# Patient Record
Sex: Female | Born: 1945
Health system: Southern US, Community
[De-identification: ages and names within clinical notes are randomized; demographics above are authoritative.]

## PROBLEM LIST (undated history)

## (undated) DIAGNOSIS — I10 Essential (primary) hypertension: Secondary | ICD-10-CM

## (undated) DIAGNOSIS — C801 Malignant (primary) neoplasm, unspecified: Secondary | ICD-10-CM

## (undated) DIAGNOSIS — J449 Chronic obstructive pulmonary disease, unspecified: Secondary | ICD-10-CM

## (undated) DIAGNOSIS — I6783 Posterior reversible encephalopathy syndrome: Secondary | ICD-10-CM

## (undated) HISTORY — PX: SPLENECTOMY, TOTAL: SHX788

## (undated) HISTORY — PX: LUNG SURGERY: SHX703

## (undated) HISTORY — PX: LEG SURGERY: SHX1003

## (undated) HISTORY — PX: ABDOMINAL HYSTERECTOMY: SHX81

## (undated) HISTORY — PX: THYROIDECTOMY: SHX17

## (undated) HISTORY — PX: CHOLECYSTECTOMY: SHX55

## (undated) HISTORY — DX: Posterior reversible encephalopathy syndrome: I67.83

## (undated) HISTORY — PX: CATARACT EXTRACTION, BILATERAL: SHX1313

## (undated) HISTORY — PX: HIP SURGERY: SHX245

---

## 2013-01-01 DIAGNOSIS — E890 Postprocedural hypothyroidism: Secondary | ICD-10-CM | POA: Diagnosis present

## 2017-07-03 DIAGNOSIS — C569 Malignant neoplasm of unspecified ovary: Secondary | ICD-10-CM | POA: Diagnosis present

## 2019-04-15 DIAGNOSIS — Z7901 Long term (current) use of anticoagulants: Secondary | ICD-10-CM

## 2019-07-03 ENCOUNTER — Inpatient Hospital Stay (HOSPITAL_COMMUNITY)
Admission: EM | Admit: 2019-07-03 | Discharge: 2019-07-17 | DRG: 070 | Disposition: A | Discharge status: Home Health | Payer: Medicare Other | Attending: Internal Medicine | Admitting: Internal Medicine

## 2019-07-03 ENCOUNTER — Emergency Department (HOSPITAL_COMMUNITY): Payer: Medicare Other

## 2019-07-03 ENCOUNTER — Other Ambulatory Visit: Payer: Self-pay

## 2019-07-03 ENCOUNTER — Encounter (HOSPITAL_COMMUNITY): Payer: Self-pay | Admitting: Emergency Medicine

## 2019-07-03 DIAGNOSIS — E876 Hypokalemia: Secondary | ICD-10-CM | POA: Diagnosis not present

## 2019-07-03 DIAGNOSIS — R0603 Acute respiratory distress: Secondary | ICD-10-CM | POA: Diagnosis not present

## 2019-07-03 DIAGNOSIS — Z7901 Long term (current) use of anticoagulants: Secondary | ICD-10-CM

## 2019-07-03 DIAGNOSIS — E871 Hypo-osmolality and hyponatremia: Secondary | ICD-10-CM | POA: Diagnosis not present

## 2019-07-03 DIAGNOSIS — R0602 Shortness of breath: Secondary | ICD-10-CM | POA: Diagnosis not present

## 2019-07-03 DIAGNOSIS — F329 Major depressive disorder, single episode, unspecified: Secondary | ICD-10-CM | POA: Diagnosis present

## 2019-07-03 DIAGNOSIS — I129 Hypertensive chronic kidney disease with stage 1 through stage 4 chronic kidney disease, or unspecified chronic kidney disease: Secondary | ICD-10-CM | POA: Diagnosis present

## 2019-07-03 DIAGNOSIS — R509 Fever, unspecified: Secondary | ICD-10-CM

## 2019-07-03 DIAGNOSIS — D649 Anemia, unspecified: Secondary | ICD-10-CM | POA: Diagnosis not present

## 2019-07-03 DIAGNOSIS — Z86711 Personal history of pulmonary embolism: Secondary | ICD-10-CM

## 2019-07-03 DIAGNOSIS — C569 Malignant neoplasm of unspecified ovary: Secondary | ICD-10-CM | POA: Diagnosis not present

## 2019-07-03 DIAGNOSIS — A419 Sepsis, unspecified organism: Secondary | ICD-10-CM | POA: Diagnosis not present

## 2019-07-03 DIAGNOSIS — G9340 Encephalopathy, unspecified: Secondary | ICD-10-CM | POA: Diagnosis not present

## 2019-07-03 DIAGNOSIS — M47812 Spondylosis without myelopathy or radiculopathy, cervical region: Secondary | ICD-10-CM | POA: Diagnosis present

## 2019-07-03 DIAGNOSIS — M542 Cervicalgia: Secondary | ICD-10-CM | POA: Diagnosis not present

## 2019-07-03 DIAGNOSIS — Z9089 Acquired absence of other organs: Secondary | ICD-10-CM

## 2019-07-03 DIAGNOSIS — R32 Unspecified urinary incontinence: Secondary | ICD-10-CM | POA: Diagnosis present

## 2019-07-03 DIAGNOSIS — E890 Postprocedural hypothyroidism: Secondary | ICD-10-CM | POA: Diagnosis present

## 2019-07-03 DIAGNOSIS — N170 Acute kidney failure with tubular necrosis: Secondary | ICD-10-CM | POA: Diagnosis not present

## 2019-07-03 DIAGNOSIS — M4802 Spinal stenosis, cervical region: Secondary | ICD-10-CM | POA: Diagnosis present

## 2019-07-03 DIAGNOSIS — J9601 Acute respiratory failure with hypoxia: Secondary | ICD-10-CM | POA: Diagnosis not present

## 2019-07-03 DIAGNOSIS — I6783 Posterior reversible encephalopathy syndrome: Principal | ICD-10-CM | POA: Diagnosis present

## 2019-07-03 DIAGNOSIS — R6520 Severe sepsis without septic shock: Secondary | ICD-10-CM | POA: Diagnosis not present

## 2019-07-03 DIAGNOSIS — Z79899 Other long term (current) drug therapy: Secondary | ICD-10-CM | POA: Diagnosis not present

## 2019-07-03 DIAGNOSIS — R680 Hypothermia, not associated with low environmental temperature: Secondary | ICD-10-CM | POA: Diagnosis not present

## 2019-07-03 DIAGNOSIS — Z79890 Hormone replacement therapy (postmenopausal): Secondary | ICD-10-CM

## 2019-07-03 DIAGNOSIS — R0600 Dyspnea, unspecified: Secondary | ICD-10-CM

## 2019-07-03 DIAGNOSIS — E872 Acidosis: Secondary | ICD-10-CM | POA: Diagnosis not present

## 2019-07-03 DIAGNOSIS — R4701 Aphasia: Secondary | ICD-10-CM | POA: Diagnosis present

## 2019-07-03 DIAGNOSIS — J189 Pneumonia, unspecified organism: Secondary | ICD-10-CM | POA: Diagnosis not present

## 2019-07-03 DIAGNOSIS — R21 Rash and other nonspecific skin eruption: Secondary | ICD-10-CM | POA: Diagnosis present

## 2019-07-03 DIAGNOSIS — K529 Noninfective gastroenteritis and colitis, unspecified: Secondary | ICD-10-CM | POA: Diagnosis present

## 2019-07-03 DIAGNOSIS — Z86718 Personal history of other venous thrombosis and embolism: Secondary | ICD-10-CM

## 2019-07-03 DIAGNOSIS — I10 Essential (primary) hypertension: Secondary | ICD-10-CM | POA: Diagnosis not present

## 2019-07-03 DIAGNOSIS — Z20828 Contact with and (suspected) exposure to other viral communicable diseases: Secondary | ICD-10-CM | POA: Diagnosis present

## 2019-07-03 DIAGNOSIS — Z9221 Personal history of antineoplastic chemotherapy: Secondary | ICD-10-CM

## 2019-07-03 DIAGNOSIS — Z9081 Acquired absence of spleen: Secondary | ICD-10-CM | POA: Diagnosis not present

## 2019-07-03 DIAGNOSIS — N179 Acute kidney failure, unspecified: Secondary | ICD-10-CM

## 2019-07-03 DIAGNOSIS — C799 Secondary malignant neoplasm of unspecified site: Secondary | ICD-10-CM | POA: Diagnosis present

## 2019-07-03 DIAGNOSIS — L909 Atrophic disorder of skin, unspecified: Secondary | ICD-10-CM | POA: Diagnosis not present

## 2019-07-03 DIAGNOSIS — R531 Weakness: Secondary | ICD-10-CM | POA: Diagnosis not present

## 2019-07-03 DIAGNOSIS — R4182 Altered mental status, unspecified: Secondary | ICD-10-CM | POA: Diagnosis not present

## 2019-07-03 DIAGNOSIS — D630 Anemia in neoplastic disease: Secondary | ICD-10-CM | POA: Diagnosis present

## 2019-07-03 DIAGNOSIS — C7960 Secondary malignant neoplasm of unspecified ovary: Secondary | ICD-10-CM | POA: Diagnosis not present

## 2019-07-03 DIAGNOSIS — R34 Anuria and oliguria: Secondary | ICD-10-CM | POA: Diagnosis not present

## 2019-07-03 DIAGNOSIS — Z888 Allergy status to other drugs, medicaments and biological substances status: Secondary | ICD-10-CM | POA: Diagnosis not present

## 2019-07-03 DIAGNOSIS — T451X5A Adverse effect of antineoplastic and immunosuppressive drugs, initial encounter: Secondary | ICD-10-CM | POA: Diagnosis present

## 2019-07-03 DIAGNOSIS — Z90722 Acquired absence of ovaries, bilateral: Secondary | ICD-10-CM

## 2019-07-03 DIAGNOSIS — G92 Toxic encephalopathy: Secondary | ICD-10-CM | POA: Diagnosis not present

## 2019-07-03 DIAGNOSIS — D638 Anemia in other chronic diseases classified elsewhere: Secondary | ICD-10-CM | POA: Diagnosis not present

## 2019-07-03 DIAGNOSIS — R1310 Dysphagia, unspecified: Secondary | ICD-10-CM | POA: Diagnosis present

## 2019-07-03 DIAGNOSIS — R159 Full incontinence of feces: Secondary | ICD-10-CM | POA: Diagnosis not present

## 2019-07-03 DIAGNOSIS — R413 Other amnesia: Secondary | ICD-10-CM | POA: Diagnosis not present

## 2019-07-03 DIAGNOSIS — Z9079 Acquired absence of other genital organ(s): Secondary | ICD-10-CM

## 2019-07-03 DIAGNOSIS — N19 Unspecified kidney failure: Secondary | ICD-10-CM | POA: Diagnosis not present

## 2019-07-03 DIAGNOSIS — Z9181 History of falling: Secondary | ICD-10-CM

## 2019-07-03 DIAGNOSIS — E8809 Other disorders of plasma-protein metabolism, not elsewhere classified: Secondary | ICD-10-CM | POA: Diagnosis not present

## 2019-07-03 DIAGNOSIS — L658 Other specified nonscarring hair loss: Secondary | ICD-10-CM | POA: Diagnosis present

## 2019-07-03 DIAGNOSIS — R197 Diarrhea, unspecified: Secondary | ICD-10-CM | POA: Diagnosis not present

## 2019-07-03 DIAGNOSIS — B59 Pneumocystosis: Secondary | ICD-10-CM | POA: Diagnosis not present

## 2019-07-03 DIAGNOSIS — D539 Nutritional anemia, unspecified: Secondary | ICD-10-CM | POA: Diagnosis not present

## 2019-07-03 DIAGNOSIS — C801 Malignant (primary) neoplasm, unspecified: Secondary | ICD-10-CM | POA: Diagnosis not present

## 2019-07-03 DIAGNOSIS — D72829 Elevated white blood cell count, unspecified: Secondary | ICD-10-CM | POA: Diagnosis not present

## 2019-07-03 DIAGNOSIS — Z9889 Other specified postprocedural states: Secondary | ICD-10-CM | POA: Diagnosis not present

## 2019-07-03 DIAGNOSIS — Z90710 Acquired absence of both cervix and uterus: Secondary | ICD-10-CM | POA: Diagnosis not present

## 2019-07-03 DIAGNOSIS — N183 Chronic kidney disease, stage 3 (moderate): Secondary | ICD-10-CM | POA: Diagnosis present

## 2019-07-03 HISTORY — DX: Malignant (primary) neoplasm, unspecified: C80.1

## 2019-07-03 LAB — COMPREHENSIVE METABOLIC PANEL
ALT: 17 U/L (ref 0–44)
ALT: 17 U/L (ref 0–44)
AST: 19 U/L (ref 15–41)
AST: 27 U/L (ref 15–41)
Albumin: 2.7 g/dL — ABNORMAL LOW (ref 3.5–5.0)
Albumin: 2.8 g/dL — ABNORMAL LOW (ref 3.5–5.0)
Alkaline Phosphatase: 73 U/L (ref 38–126)
Alkaline Phosphatase: 74 U/L (ref 38–126)
Anion gap: 12 (ref 5–15)
Anion gap: 12 (ref 5–15)
BUN: 13 mg/dL (ref 8–23)
BUN: 14 mg/dL (ref 8–23)
CO2: 27 mmol/L (ref 22–32)
CO2: 23 mmol/L (ref 22–32)
Calcium: 11.4 mg/dL — ABNORMAL HIGH (ref 8.9–10.3)
Calcium: 10.6 mg/dL — ABNORMAL HIGH (ref 8.9–10.3)
Chloride: 96 mmol/L — ABNORMAL LOW (ref 98–111)
Chloride: 102 mmol/L (ref 98–111)
Creatinine, Ser: 1.23 mg/dL — ABNORMAL HIGH (ref 0.44–1.00)
Creatinine, Ser: 1.25 mg/dL — ABNORMAL HIGH (ref 0.44–1.00)
GFR calc Af Amer: 50 mL/min — ABNORMAL LOW (ref >60)
GFR calc Af Amer: 49 mL/min — ABNORMAL LOW (ref >60)
GFR calc non Af Amer: 43 mL/min — ABNORMAL LOW (ref >60)
GFR calc non Af Amer: 42 mL/min — ABNORMAL LOW (ref >60)
Glucose, Bld: 136 mg/dL — ABNORMAL HIGH (ref 70–99)
Glucose, Bld: 104 mg/dL — ABNORMAL HIGH (ref 70–99)
Potassium: 3.3 mmol/L — ABNORMAL LOW (ref 3.5–5.1)
Potassium: 3.8 mmol/L (ref 3.5–5.1)
Sodium: 135 mmol/L (ref 135–145)
Sodium: 137 mmol/L (ref 135–145)
Total Bilirubin: 0.4 mg/dL (ref 0.3–1.2)
Total Bilirubin: 0.5 mg/dL (ref 0.3–1.2)
Total Protein: 5.7 g/dL — ABNORMAL LOW (ref 6.5–8.1)
Total Protein: 6.2 g/dL — ABNORMAL LOW (ref 6.5–8.1)

## 2019-07-03 LAB — PROTIME-INR
INR: 1.8 — ABNORMAL HIGH (ref 0.8–1.2)
INR: 1.5 — ABNORMAL HIGH (ref 0.8–1.2)
Prothrombin Time: 21 seconds — ABNORMAL HIGH (ref 11.4–15.2)
Prothrombin Time: 18.3 seconds — ABNORMAL HIGH (ref 11.4–15.2)

## 2019-07-03 LAB — CBC WITH DIFFERENTIAL/PLATELET
Abs Immature Granulocytes: 0.3 10*3/uL — ABNORMAL HIGH (ref 0.00–0.07)
Basophils Absolute: 0.3 10*3/uL — ABNORMAL HIGH (ref 0.0–0.1)
Basophils Relative: 5 %
Eosinophils Absolute: 0 10*3/uL (ref 0.0–0.5)
Eosinophils Relative: 0 %
HCT: 25.9 % — ABNORMAL LOW (ref 36.0–46.0)
Hemoglobin: 8.9 g/dL — ABNORMAL LOW (ref 12.0–15.0)
Lymphocytes Relative: 18 %
Lymphs Abs: 1 10*3/uL (ref 0.7–4.0)
MCH: 34.4 pg — ABNORMAL HIGH (ref 26.0–34.0)
MCHC: 34.4 g/dL (ref 30.0–36.0)
MCV: 100 fL (ref 80.0–100.0)
Metamyelocytes Relative: 1 %
Monocytes Absolute: 0.1 10*3/uL (ref 0.1–1.0)
Monocytes Relative: 1 %
Myelocytes: 2 %
Neutro Abs: 3.8 10*3/uL (ref 1.7–7.7)
Neutrophils Relative %: 71 %
Platelets: 315 10*3/uL (ref 150–400)
Promyelocytes Relative: 2 %
RBC: 2.59 MIL/uL — ABNORMAL LOW (ref 3.87–5.11)
RDW: 18.6 % — ABNORMAL HIGH (ref 11.5–15.5)
WBC: 5.4 10*3/uL (ref 4.0–10.5)
nRBC: 17.5 % — ABNORMAL HIGH (ref 0.0–0.2)
nRBC: 40 /100 WBC — ABNORMAL HIGH

## 2019-07-03 LAB — URINALYSIS, ROUTINE W REFLEX MICROSCOPIC
Bilirubin Urine: NEGATIVE
Glucose, UA: NEGATIVE mg/dL
Hgb urine dipstick: NEGATIVE
Ketones, ur: NEGATIVE mg/dL
Nitrite: POSITIVE — AB
Protein, ur: NEGATIVE mg/dL
Specific Gravity, Urine: 1.014 (ref 1.005–1.030)
pH: 6 (ref 5.0–8.0)

## 2019-07-03 LAB — APTT: aPTT: 42 seconds — ABNORMAL HIGH (ref 24–36)

## 2019-07-03 LAB — LACTIC ACID, PLASMA: Lactic Acid, Venous: 1.7 mmol/L (ref 0.5–1.9)

## 2019-07-03 LAB — PHOSPHORUS: Phosphorus: 3.4 mg/dL (ref 2.5–4.6)

## 2019-07-03 LAB — MAGNESIUM: Magnesium: 1.1 mg/dL — ABNORMAL LOW (ref 1.7–2.4)

## 2019-07-03 LAB — SARS CORONAVIRUS 2 BY RT PCR (HOSPITAL ORDER, PERFORMED IN ~~LOC~~ HOSPITAL LAB): SARS Coronavirus 2: NEGATIVE

## 2019-07-03 MED ORDER — DEXTROSE 5 % AND 0.45 % NACL IV BOLUS: 1000.0000 mL | Freq: Once | INTRAVENOUS | Status: DC

## 2019-07-03 MED ORDER — SODIUM CHLORIDE 0.9 % IV BOLUS
500.0000 mL | Freq: Once | INTRAVENOUS | Status: AC
  Administered 2019-07-03: 500 mL via INTRAVENOUS

## 2019-07-03 MED ORDER — LEVOTHYROXINE SODIUM 25 MCG PO TABS
137.0000 ug | ORAL_TABLET | Freq: Every day | ORAL | Status: DC
  Administered 2019-07-04 – 2019-07-17 (×13): 137 ug via ORAL
  Filled 2019-07-03 (×13): qty 1

## 2019-07-03 MED ORDER — BUPROPION HCL ER (XL) 150 MG PO TB24
150.0000 mg | ORAL_TABLET | Freq: Every day | ORAL | Status: DC
  Administered 2019-07-04: 150 mg via ORAL
  Filled 2019-07-03 (×2): qty 1

## 2019-07-03 MED ORDER — SODIUM CHLORIDE 0.9 % IV SOLN
2.0000 g | Freq: Two times a day (BID) | INTRAVENOUS | Status: DC
  Administered 2019-07-04 – 2019-07-05 (×3): 2 g via INTRAVENOUS
  Filled 2019-07-03 (×3): qty 2

## 2019-07-03 MED ORDER — ACETAMINOPHEN 650 MG RE SUPP
650.0000 mg | Freq: Four times a day (QID) | RECTAL | Status: DC | PRN
  Administered 2019-07-05 – 2019-07-10 (×2): 650 mg via RECTAL
  Filled 2019-07-03 (×3): qty 1

## 2019-07-03 MED ORDER — SODIUM CHLORIDE 0.9% FLUSH
10.0000 mL | INTRAVENOUS | Status: DC | PRN
  Administered 2019-07-10 (×2): 10 mL
  Filled 2019-07-03 (×2): qty 40

## 2019-07-03 MED ORDER — LACTATED RINGERS IV BOLUS
500.0000 mL | Freq: Once | INTRAVENOUS | Status: AC
  Administered 2019-07-03: 500 mL via INTRAVENOUS

## 2019-07-03 MED ORDER — POTASSIUM CHLORIDE CRYS ER 20 MEQ PO TBCR
40.0000 meq | EXTENDED_RELEASE_TABLET | Freq: Once | ORAL | Status: AC
  Administered 2019-07-03: 40 meq via ORAL
  Filled 2019-07-03: qty 2

## 2019-07-03 MED ORDER — ONDANSETRON HCL 4 MG PO TABS: 4.0000 mg | ORAL_TABLET | Freq: Four times a day (QID) | ORAL | Status: DC | PRN

## 2019-07-03 MED ORDER — SODIUM CHLORIDE 0.9% FLUSH
3.0000 mL | Freq: Two times a day (BID) | INTRAVENOUS | Status: DC
  Administered 2019-07-05 – 2019-07-16 (×8): 3 mL via INTRAVENOUS

## 2019-07-03 MED ORDER — GADOBUTROL 1 MMOL/ML IV SOLN
7.5000 mL | Freq: Once | INTRAVENOUS | Status: AC | PRN
  Administered 2019-07-03: 7.5 mL via INTRAVENOUS

## 2019-07-03 MED ORDER — VANCOMYCIN HCL 10 G IV SOLR: 1250.0000 mg | INTRAVENOUS | Status: DC

## 2019-07-03 MED ORDER — RIVAROXABAN 20 MG PO TABS
20.0000 mg | ORAL_TABLET | Freq: Every day | ORAL | Status: DC
  Administered 2019-07-04 – 2019-07-06 (×3): 20 mg via ORAL
  Filled 2019-07-03 (×4): qty 1

## 2019-07-03 MED ORDER — GABAPENTIN 300 MG PO CAPS
600.0000 mg | ORAL_CAPSULE | Freq: Every day | ORAL | Status: DC
  Administered 2019-07-03 – 2019-07-04 (×2): 600 mg via ORAL
  Filled 2019-07-03 (×2): qty 2

## 2019-07-03 MED ORDER — MAGNESIUM SULFATE 4 GM/100ML IV SOLN
4.0000 g | Freq: Once | INTRAVENOUS | Status: AC
  Administered 2019-07-03: 4 g via INTRAVENOUS
  Filled 2019-07-03: qty 100

## 2019-07-03 MED ORDER — SODIUM CHLORIDE 0.9 % IV SOLN
INTRAVENOUS | Status: DC
  Administered 2019-07-03 – 2019-07-04 (×2): via INTRAVENOUS

## 2019-07-03 MED ORDER — THIAMINE HCL 100 MG/ML IJ SOLN: 100.0000 mg | Freq: Every day | INTRAMUSCULAR | Status: DC

## 2019-07-03 MED ORDER — ESCITALOPRAM OXALATE 10 MG PO TABS
20.0000 mg | ORAL_TABLET | Freq: Every day | ORAL | Status: DC
  Administered 2019-07-04: 20 mg via ORAL
  Filled 2019-07-03 (×2): qty 2

## 2019-07-03 MED ORDER — VANCOMYCIN HCL 10 G IV SOLR
1750.0000 mg | Freq: Once | INTRAVENOUS | Status: AC
  Administered 2019-07-03: 1750 mg via INTRAVENOUS
  Filled 2019-07-03: qty 1750

## 2019-07-03 MED ORDER — ACETAMINOPHEN 325 MG PO TABS
650.0000 mg | ORAL_TABLET | Freq: Four times a day (QID) | ORAL | Status: DC | PRN
  Administered 2019-07-03 – 2019-07-06 (×6): 650 mg via ORAL
  Filled 2019-07-03 (×9): qty 2

## 2019-07-03 MED ORDER — ONDANSETRON HCL 4 MG/2ML IJ SOLN
4.0000 mg | Freq: Four times a day (QID) | INTRAMUSCULAR | Status: DC | PRN
  Administered 2019-07-15 – 2019-07-17 (×3): 4 mg via INTRAVENOUS
  Filled 2019-07-03 (×3): qty 2

## 2019-07-03 MED ORDER — LIDOCAINE-PRILOCAINE 2.5-2.5 % EX CREA
1.0000 "application " | TOPICAL_CREAM | CUTANEOUS | Status: DC | PRN
  Filled 2019-07-03: qty 5

## 2019-07-03 MED ORDER — SENNOSIDES-DOCUSATE SODIUM 8.6-50 MG PO TABS: 1.0000 | ORAL_TABLET | Freq: Every evening | ORAL | Status: DC | PRN

## 2019-07-03 MED ORDER — SODIUM CHLORIDE 0.9 % IV SOLN
2.0000 g | Freq: Once | INTRAVENOUS | Status: AC
  Administered 2019-07-03: 2 g via INTRAVENOUS
  Filled 2019-07-03: qty 2

## 2019-07-03 NOTE — ED Provider Notes (Signed)
[facility] EMERGENCY DEPARTMENT Provider Note   CSN: [id number] Arrival date & time: 07/03/19  3875    History   Chief Complaint Chief Complaint  Patient presents with   Weakness   Fever    HPI Rhonda Hale is a 73 y.o. female.     The history is provided by the patient, the EMS personnel and medical records. No language interpreter was used.  Weakness Associated symptoms: fever   Fever  Rhonda Hale is a 73 y.o. female that presents to the ED complaining of weakness, fever, diarrhea.  She began feeling poorly yesterday with fever to 100.5. Today she had a fever to 102. Today she developed diarrhea and generalized weakness.  She also complains of left sided posterior neck pain for the last several days.  Pain is waxing and waning with no clear alleviating factors.  She is currently undergoing chemo for hx/o ovarian cancer -last treatment was one week ago. She is not had problems with fevers during her chemo. She is status post splenectomy.  Past Medical History:  Diagnosis Date   Cancer [facility])     Patient Active Problem List   Diagnosis Date Noted   Fever 07/03/2019   AKI (acute kidney injury) ([place]) 07/03/2019   Hypercalcemia 07/03/2019   Hypomagnesemia, chronic 07/03/2019       OB History   No obstetric history on file.      Home Medications    Prior to Admission medications   Medication Sig Start Date End Date Taking? Authorizing Provider  buPROPion (WELLBUTRIN XL) 150 MG 24 hr tablet Take 150 mg by mouth daily. 06/17/19  Yes [provider]  calcitRIOL (ROCALTROL) 0.5 MCG capsule Take 0.5 mcg by mouth 2 (two) times a day. 05/13/19  Yes [provider]  Calcium Carb-Cholecalciferol (CALCIUM-VITAMIN D) 600-400 MG-UNIT TABS Take 1 tablet by mouth 2 (two) times a day.   Yes [provider]  dexamethasone (DECADRON) 4 MG tablet Take 4 mg by mouth as needed. After Chemotherapy 06/07/19  Yes [provider]   escitalopram (LEXAPRO) 20 MG tablet Take 20 mg by mouth daily.   Yes [provider]  gabapentin (NEURONTIN) 300 MG capsule Take 600 mg by mouth at bedtime. 05/03/19  Yes [provider]  levothyroxine (SYNTHROID) 137 MCG tablet Take 137 mcg by mouth daily before breakfast.   Yes [provider]  lidocaine-prilocaine (EMLA) cream Apply 1 application topically as needed. 08/17/18 08/17/19 Yes [provider]  ondansetron (ZOFRAN) 8 MG tablet Take 8 mg by mouth every 8 (eight) hours as needed for nausea or vomiting.   Yes [provider]  rivaroxaban (XARELTO) 20 MG TABS tablet Take 20 mg by mouth daily.   Yes [provider]    Family History History reviewed. No pertinent family history.  Social History Social History   Tobacco Use   Smoking status: Never Smoker   Smokeless tobacco: Never Used  Substance Use Topics   Alcohol use: Not Currently   Drug use: Never     Allergies   Atorvastatin   Review of Systems Review of Systems  Constitutional: Positive for fever.  Neurological: Positive for weakness.  All other systems reviewed and are negative.    Physical Exam Updated Vital Signs BP (!) 166/76    Pulse 78    Temp 100.3 F (37.9 C) (Rectal)    Resp (!) 24    Ht 5\' 2"  (1.575 m)    Wt 78.5 kg  SpO2 94%    BMI 31.64 kg/m   Physical Exam Vitals signs and nursing note reviewed.  Constitutional:      Appearance: She is well-developed.  HENT:     Head: Normocephalic and atraumatic.  Cardiovascular:     Rate and Rhythm: Normal rate and regular rhythm.     Heart sounds: No murmur.  Pulmonary:     Effort: Pulmonary effort is normal. No respiratory distress.     Breath sounds: Normal breath sounds.  Abdominal:     Palpations: Abdomen is soft.     Tenderness: There is no abdominal tenderness. There is no guarding or rebound.  Musculoskeletal:        General: No tenderness.  Skin:    General: Skin is warm and dry.      Comments: Maculopapular rash to bilateral upper extremities  Neurological:     Mental Status: She is alert and oriented to person, place, and time.     Comments: Five out of five strength in all four extremities.  Psychiatric:        Behavior: Behavior normal.      ED Treatments / Results  Labs (all labs ordered are listed, but only abnormal results are displayed) Labs Reviewed  COMPREHENSIVE METABOLIC PANEL - Abnormal; Notable for the following components:      Result Value   Potassium 3.3 (*)    Chloride 96 (*)    Glucose, Bld 136 (*)    Creatinine, Ser 1.23 (*)    Calcium 11.4 (*)    Total Protein 5.7 (*)    Albumin 2.7 (*)    GFR calc non Af Amer 43 (*)    GFR calc Af Amer 50 (*)    All other components within normal limits  CBC WITH DIFFERENTIAL/PLATELET - Abnormal; Notable for the following components:   RBC 2.59 (*)    Hemoglobin 8.9 (*)    HCT 25.9 (*)    MCH 34.4 (*)    RDW 18.6 (*)    nRBC 17.5 (*)    Basophils Absolute 0.3 (*)    nRBC 40 (*)    Abs Immature Granulocytes 0.30 (*)    All other components within normal limits  APTT - Abnormal; Notable for the following components:   aPTT 42 (*)    All other components within normal limits  PROTIME-INR - Abnormal; Notable for the following components:   Prothrombin Time 21.0 (*)    INR 1.8 (*)    All other components within normal limits  MAGNESIUM - Abnormal; Notable for the following components:   Magnesium 1.1 (*)    All other components within normal limits  SARS CORONAVIRUS 2 (HOSPITAL ORDER, [place])  CULTURE, BLOOD (ROUTINE X 2)  CULTURE, BLOOD (ROUTINE X 2)  URINE CULTURE  MRSA PCR SCREENING  LACTIC ACID, PLASMA  URINALYSIS, ROUTINE W REFLEX MICROSCOPIC  PHOSPHORUS  COMPREHENSIVE METABOLIC PANEL  PROTIME-INR    EKG EKG Interpretation  Date/Time:  Wednesday July 03 2019 09:18:24 EDT Ventricular Rate:  99 PR Interval:    QRS Duration: 74 QT  Interval:  333 QTC Calculation: 428 R Axis:   -14 Text Interpretation:  Ectopic atrial rhythm Minimal ST depression, lateral leads no prior available for comparison Confirmed by Quintella Reichert [phone]) on 07/03/2019 9:47:29 AM   Radiology Mr Cervical Spine W Or Wo Contrast  Result Date: 07/03/2019 CLINICAL DATA:  Fever.  Posterior left-sided neck pain. EXAM: MRI CERVICAL SPINE WITHOUT AND WITH CONTRAST  TECHNIQUE: Multiplanar and multiecho pulse sequences of the cervical spine, to include the craniocervical junction and cervicothoracic junction, were obtained without and with intravenous contrast. CONTRAST:  7.5 mL Gadavist intravenous contrast. COMPARISON:  None. FINDINGS: Alignment: Physiologic. Vertebrae: No fracture, evidence of discitis, or marrow replacing lesion. No abnormal enhancement. Cord: Normal signal and morphology. Posterior Fossa, vertebral arteries, paraspinal tissues: Poor visualization of the right vertebral artery flow void. Left vertebral artery appears unremarkable. Posterior fossa is unremarkable. No abnormality within the paraspinal soft tissues. No abnormal enhancement. Disc levels: C2-C3: No significant disc protrusion. Right greater than left uncovertebral arthropathy. Left worse than right facet arthropathy. Mild bilateral foraminal narrowing. No canal stenosis. C3-C4: Posterior disc osteophyte complex with impress upon the ventral cord. Bilateral facet and uncovertebral arthropathy result in moderate bilateral foraminal narrowing. Mild-to-moderate canal stenosis. C4-C5: Small posterior disc osteophyte complex and mild bilateral facet and uncovertebral arthropathy resultant mild bilateral foraminal narrowing and mild canal stenosis. C5-C6: Posterior disc osteophyte complex and bilateral facet and uncovertebral arthropathy result in moderate bilateral foraminal narrowing and moderate canal stenosis. C6-C7: Small posterior disc osteophyte complex and bilateral uncovertebral  arthropathy result in mild bilateral foraminal narrowing without significant canal stenosis. C7-T1: No significant disc protrusion, foraminal stenosis, or canal stenosis. IMPRESSION: 1. Multilevel cervical spondylosis most pronounced at the C5-6 level where there is moderate bilateral foraminal narrowing and moderate canal stenosis. 2. No mass or fluid collection identified.  No abnormal enhancement. 3. Poor visualization of the right vertebral artery flow void, which is of uncertain clinical significance. A CT angiogram of the neck could be considered if the patient's neurologic symptoms clinically warrant further evaluation. Electronically Signed   By: Davina Poke M.D.   On: 07/03/2019 15:02   Dg Chest Port 1 View  Result Date: 07/03/2019 CLINICAL DATA:  Onset fever and weakness yesterday. EXAM: PORTABLE CHEST 1 VIEW COMPARISON:  None. FINDINGS: Port-A-Cath is in place. Postoperative change left upper lobe noted. Lungs are clear. Heart size is normal. No pneumothorax or pleural effusion. No acute bony abnormality. Remote healed left sixth rib fracture noted. IMPRESSION: No acute disease. Electronically Signed   By: Inge Rise M.D.   On: 07/03/2019 10:12    Procedures Procedures (including critical care time)  Medications Ordered in ED Medications  vancomycin (VANCOCIN) 1,250 mg in sodium chloride 0.9 % 250 mL IVPB (has no administration in time range)  ceFEPIme (MAXIPIME) 2 g in sodium chloride 0.9 % 100 mL IVPB (has no administration in time range)  buPROPion (WELLBUTRIN XL) 24 hr tablet 150 mg (has no administration in time range)  escitalopram (LEXAPRO) tablet 20 mg (has no administration in time range)  levothyroxine (SYNTHROID) tablet 137 mcg (has no administration in time range)  rivaroxaban (XARELTO) tablet 20 mg (has no administration in time range)  gabapentin (NEURONTIN) capsule 600 mg (has no administration in time range)  lidocaine-prilocaine (EMLA) cream 1 application  (has no administration in time range)  sodium chloride flush (NS) 0.9 % injection 3 mL (has no administration in time range)  magnesium sulfate IVPB 4 g 100 mL (has no administration in time range)  acetaminophen (TYLENOL) tablet 650 mg (has no administration in time range)    Or  acetaminophen (TYLENOL) suppository 650 mg (has no administration in time range)  senna-docusate (Senokot-S) tablet 1 tablet (has no administration in time range)  ondansetron (ZOFRAN) tablet 4 mg (has no administration in time range)    Or  ondansetron (ZOFRAN) injection 4 mg (has no administration in  time range)  potassium chloride SA (K-DUR) CR tablet 40 mEq (has no administration in time range)  0.9 %  sodium chloride infusion (has no administration in time range)  sodium chloride 0.9 % bolus 500 mL (0 mLs Intravenous Stopped 07/03/19 1129)  vancomycin (VANCOCIN) 1,750 mg in sodium chloride 0.9 % 500 mL IVPB (1,750 mg Intravenous New Bag/Given 07/03/19 1129)  ceFEPIme (MAXIPIME) 2 g in sodium chloride 0.9 % 100 mL IVPB (0 g Intravenous Stopped 07/03/19 1128)  lactated ringers bolus 500 mL (500 mLs Intravenous New Bag/Given 07/03/19 1303)  gadobutrol (GADAVIST) 1 MMOL/ML injection 7.5 mL (7.5 mLs Intravenous Contrast Given 07/03/19 1440)     Initial Impression / Assessment and Plan / ED Course  I have reviewed the triage vital signs and the nursing notes.  Pertinent labs & imaging results that were available during my care of the patient were reviewed by me and considered in my medical decision making (see chart for details).        Patient here for evaluation of fever, weakness. She is chronically ill appearing on evaluation and in no acute distress. Given her history of splenectomy, recent chemotherapy she was treated with broad-spectrum antibiotics for possible bacteremia. Labs demonstrate stable renal insufficiency, anemia. COVID swab is negative. Plan to admit for further monitoring and observation. Patient  and daughter updated of findings of studies recommendation for admission and she is in agreement with treatment plan.  Final Clinical Impressions(s) / ED Diagnoses   Final diagnoses:  Febrile illness, acute    ED Discharge Orders    None       Quintella Reichert, MD 07/03/19 [phone]

## 2019-07-03 NOTE — Progress Notes (Signed)
Pharmacy Antibiotic Note  Rhonda Hale is a 73 y.o. female presented to the ED on 07/03/2019 with complaints of weakness, fever (102.5), and diarrhea. Pt is asplenic and is currently undergoing chemo for history of ovarian cancer. Pharmacy has been consulted for vancomycin and cefepime dosing for fever of unknown origin.  Plan: Vancomycin 1750 mg IV once for loading dose.  Ordered vancomycin 1250 mg q48h based on AUC dosing Predicted AUC of ~525. AUC goal of 400-550. SCr used: 1.23  Cefepime 2g IV q12 hours. Dosing appropriate for renal function.  Blood cultures, urine culture, and COVID-19 test have been collected. MRSA PCR ordered.  Continue to follow patient's clinical condition, culture results, WBCs, and renal function.  Height: 5\' 2"  (157.5 cm) Weight: 173 lb (78.5 kg) IBW/kg (Calculated) : 50.1  Temp (24hrs), Avg:99.7 F (37.6 C), Min:99.1 F (37.3 C), Max:100.3 F (37.9 C)  No results for input(s): WBC, CREATININE, LATICACIDVEN, VANCOTROUGH, VANCOPEAK, VANCORANDOM, GENTTROUGH, GENTPEAK, GENTRANDOM, TOBRATROUGH, TOBRAPEAK, TOBRARND, AMIKACINPEAK, AMIKACINTROU, AMIKACIN in the last 168 hours.  CrCl cannot be calculated (No successful lab value found.).    Allergies  Allergen Reactions  . Atorvastatin Other (See Comments)    Severe heartburn, reflux    Antimicrobials this admission: Vancomycin 07/22 >>  Cefepime 07/22 >>  Microbiology results: BCx 07/22: collected UCx 07/22: collected MRSA PCR 07/22: pending  Thank you for allowing pharmacy to be a part of this patient's care.  Sherren Kerns, PharmD PGY1 Acute Care Pharmacy Resident [phone] 07/03/2019 10:17 AM

## 2019-07-03 NOTE — ED Triage Notes (Signed)
Pt BIB [facility] from home. Pt family reports fever last night of 100.5 and this morning of 102. Pt complaining of weakness this morning. Pt was using a walker to walk to restroom when her legs became weak and she slid to the floor. VSS. A&Ox4.

## 2019-07-03 NOTE — ED Notes (Addendum)
ED TO INPATIENT HANDOFF REPORT  ED Nurse Name and Phone #: Thurmond Butts [place] Name/Age/Gender Rhonda Hale 73 y.o. female Room/Bed: 032C/032C  Code Status   Code Status: Partial Code  Home/SNF/Other Home Patient oriented to: self, place, time and situation Is this baseline? Yes   Triage Complete: Triage complete  Chief Complaint fever  Triage Note Pt BIB [facility] from home. Pt family reports fever last night of 100.5 and this morning of 102. Pt complaining of weakness this morning. Pt was using a walker to walk to restroom when her legs became weak and she slid to the floor. VSS. A&Ox4.   Allergies Allergies  Allergen Reactions  . Atorvastatin Other (See Comments)    Severe heartburn, reflux    Level of Care/Admitting Diagnosis ED Disposition    ED Disposition Condition [facility]: [place] [[id number]]  Level of Care: Telemetry Medical [104]  Covid Evaluation: Confirmed COVID Negative  Diagnosis: Fever [161096]  Admitting Physician: Aline Brochure  Attending Physician: Bartholomew Crews [phone]  Estimated length of stay: past midnight tomorrow  Certification:: I certify this patient will need inpatient services for at least 2 midnights  PT Class (Do Not Modify): Inpatient [101]  PT Acc Code (Do Not Modify): Private [1]       B Medical/Surgery History Past Medical History:  Diagnosis Date  . Cancer [facility])       A IV Location/Drains/Wounds Patient Lines/Drains/Airways Status   Active Line/Drains/Airways    Name:   Placement date:   Placement time:   Site:   Days:   Implanted Port 07/03/19 Right Chest   07/03/19    1028    Chest   less than 1          Intake/Output Last 24 hours  Intake/Output Summary (Last 24 hours) at 07/03/2019 1555 Last data filed at 07/03/2019 1129 Gross per 24 hour  Intake 600 ml  Output -  Net 600 ml    Labs/Imaging Results for orders placed or performed during the  hospital encounter of 07/03/19 (from the past 48 hour(s))  Lactic acid, plasma     Status: None   Collection Time: 07/03/19  9:38 AM  Result Value Ref Range   Lactic Acid, Venous 1.7 0.5 - 1.9 mmol/L    Comment: Performed at [facility], [address] [place], [place] [address]  Comprehensive metabolic panel     Status: Abnormal   Collection Time: 07/03/19  9:38 AM  Result Value Ref Range   Sodium 135 135 - 145 mmol/L   Potassium 3.3 (L) 3.5 - 5.1 mmol/L   Chloride 96 (L) 98 - 111 mmol/L   CO2 27 22 - 32 mmol/L   Glucose, Bld 136 (H) 70 - 99 mg/dL   BUN 13 8 - 23 mg/dL   Creatinine, Ser 1.23 (H) 0.44 - 1.00 mg/dL   Calcium 11.4 (H) 8.9 - 10.3 mg/dL   Total Protein 5.7 (L) 6.5 - 8.1 g/dL   Albumin 2.7 (L) 3.5 - 5.0 g/dL   AST 19 15 - 41 U/L   ALT 17 0 - 44 U/L   Alkaline Phosphatase 73 38 - 126 U/L   Total Bilirubin 0.4 0.3 - 1.2 mg/dL   GFR calc non Af Amer 43 (L) >60 mL/min   GFR calc Af Amer 50 (L) >60 mL/min   Anion gap 12 5 - 15    Comment: Performed at [facility], [address] [place], [place] [address]  CBC WITH DIFFERENTIAL     Status: Abnormal   Collection Time: 07/03/19  9:38 AM  Result Value Ref Range   WBC 5.4 4.0 - 10.5 K/uL   RBC 2.59 (L) 3.87 - 5.11 MIL/uL   Hemoglobin 8.9 (L) 12.0 - 15.0 g/dL   HCT 25.9 (L) 36.0 - 46.0 %   MCV 100.0 80.0 - 100.0 fL   MCH 34.4 (H) 26.0 - 34.0 pg   MCHC 34.4 30.0 - 36.0 g/dL   RDW 18.6 (H) 11.5 - 15.5 %   Platelets 315 150 - 400 K/uL   nRBC 17.5 (H) 0.0 - 0.2 %   Neutrophils Relative % 71 %   Neutro Abs 3.8 1.7 - 7.7 K/uL   Lymphocytes Relative 18 %   Lymphs Abs 1.0 0.7 - 4.0 K/uL   Monocytes Relative 1 %   Monocytes Absolute 0.1 0.1 - 1.0 K/uL   Eosinophils Relative 0 %   Eosinophils Absolute 0.0 0.0 - 0.5 K/uL   Basophils Relative 5 %   Basophils Absolute 0.3 (H) 0.0 - 0.1 K/uL   WBC Morphology See Note     Comment: Mild Left Shift. 1 to 5% Metas and Myelos, Occ Pro Noted.  Toxic Granulation     nRBC 40 (H) 0 /100 WBC   Metamyelocytes Relative 1 %   Myelocytes 2 %   Promyelocytes Relative 2 %   Abs Immature Granulocytes 0.30 (H) 0.00 - 0.07 K/uL   Schistocytes PRESENT    Tear Drop Cells PRESENT    Pappenheimer Bodies PRESENT    Tammy Sours Bodies PRESENT    Polychromasia PRESENT     Comment: Performed at [facility], [place] [address], [place], [place] [address]  APTT     Status: Abnormal   Collection Time: 07/03/19  9:38 AM  Result Value Ref Range   aPTT 42 (H) 24 - 36 seconds    Comment:        IF BASELINE aPTT IS ELEVATED, SUGGEST PATIENT RISK ASSESSMENT BE USED TO DETERMINE APPROPRIATE ANTICOAGULANT THERAPY. Performed at [facility], [place] [address], [place], [place] [address]   Protime-INR     Status: Abnormal   Collection Time: 07/03/19  9:38 AM  Result Value Ref Range   Prothrombin Time 21.0 (H) 11.4 - 15.2 seconds   INR 1.8 (H) 0.8 - 1.2    Comment: (NOTE) INR goal varies based on device and disease states. Performed at [facility], [place] [address], [place], [place] [address]   SARS Coronavirus 2 (CEPHEID- Performed in [place] [facility] lab), Hosp Order     Status: None   Collection Time: 07/03/19 10:13 AM   Specimen: Nasopharyngeal Swab  Result Value Ref Range   SARS Coronavirus 2 NEGATIVE NEGATIVE    Comment: (NOTE) If result is NEGATIVE SARS-CoV-2 target nucleic acids are NOT DETECTED. The SARS-CoV-2 RNA is generally detectable in upper and lower  respiratory specimens during the acute phase of infection. The lowest  concentration of SARS-CoV-2 viral copies this assay can detect is 250  copies / mL. A negative result does not preclude SARS-CoV-2 infection  and should not be used as the sole basis for treatment or other  patient management decisions.  A negative result may occur with  improper specimen collection / handling, submission of specimen other  than nasopharyngeal swab, presence of viral mutation(s) within the  areas  targeted by this assay, and  inadequate number of viral copies  (<250 copies / mL). A negative result must be combined with clinical  observations, patient history, and epidemiological information. If result is POSITIVE SARS-CoV-2 target nucleic acids are DETECTED. The SARS-CoV-2 RNA is generally detectable in upper and lower  respiratory specimens dur ing the acute phase of infection.  Positive  results are indicative of active infection with SARS-CoV-2.  Clinical  correlation with patient history and other diagnostic information is  necessary to determine patient infection status.  Positive results do  not rule out bacterial infection or co-infection with other viruses. If result is PRESUMPTIVE POSTIVE SARS-CoV-2 nucleic acids MAY BE PRESENT.   A presumptive positive result was obtained on the submitted specimen  and confirmed on repeat testing.  While 2019 novel coronavirus  (SARS-CoV-2) nucleic acids may be present in the submitted sample  additional confirmatory testing may be necessary for epidemiological  and / or clinical management purposes  to differentiate between  SARS-CoV-2 and other Sarbecovirus currently known to infect humans.  If clinically indicated additional testing with an alternate test  methodology [phone]) is advised. The SARS-CoV-2 RNA is generally  detectable in upper and lower respiratory sp ecimens during the acute  phase of infection. The expected result is Negative. Fact Sheet for Patients:  StrictlyIdeas.no Fact Sheet for Healthcare Providers: BankingDealers.co.za This test is not yet approved or cleared by the [place] FDA and has been authorized for detection and/or diagnosis of SARS-CoV-2 by FDA under an Emergency Use Authorization (EUA).  This EUA will remain in effect (meaning this test can be used) for the duration of the COVID-19 declaration under Section 564(b)(1) of the Act, 21 U.S.C. section  360bbb-3(b)(1), unless the authorization is terminated or revoked sooner. Performed at [facility], [place] [address] [place], [place] [address]   Magnesium     Status: Abnormal   Collection Time: 07/03/19 12:34 PM  Result Value Ref Range   Magnesium 1.1 (L) 1.7 - 2.4 mg/dL    Comment: Performed at [facility] [address], [place], [place] [address]   Mr Cervical Spine W Or Wo Contrast  Result Date: 07/03/2019 CLINICAL DATA:  Fever.  Posterior left-sided neck pain. EXAM: MRI CERVICAL SPINE WITHOUT AND WITH CONTRAST TECHNIQUE: Multiplanar and multiecho pulse sequences of the cervical spine, to include the craniocervical junction and cervicothoracic junction, were obtained without and with intravenous contrast. CONTRAST:  7.5 mL Gadavist intravenous contrast. COMPARISON:  None. FINDINGS: Alignment: Physiologic. Vertebrae: No fracture, evidence of discitis, or marrow replacing lesion. No abnormal enhancement. Cord: Normal signal and morphology. Posterior Fossa, vertebral arteries, paraspinal tissues: Poor visualization of the right vertebral artery flow void. Left vertebral artery appears unremarkable. Posterior fossa is unremarkable. No abnormality within the paraspinal soft tissues. No abnormal enhancement. Disc levels: C2-C3: No significant disc protrusion. Right greater than left uncovertebral arthropathy. Left worse than right facet arthropathy. Mild bilateral foraminal narrowing. No canal stenosis. C3-C4: Posterior disc osteophyte complex with impress upon the ventral cord. Bilateral facet and uncovertebral arthropathy result in moderate bilateral foraminal narrowing. Mild-to-moderate canal stenosis. C4-C5: Small posterior disc osteophyte complex and mild bilateral facet and uncovertebral arthropathy resultant mild bilateral foraminal narrowing and mild canal stenosis. C5-C6: Posterior disc osteophyte complex and bilateral facet and uncovertebral arthropathy result in moderate  bilateral foraminal narrowing and moderate canal stenosis. C6-C7: Small posterior disc osteophyte complex and bilateral uncovertebral arthropathy result in mild bilateral foraminal narrowing without significant canal stenosis. C7-T1: No significant disc protrusion, foraminal stenosis,  or canal stenosis. IMPRESSION: 1. Multilevel cervical spondylosis most pronounced at the C5-6 level where there is moderate bilateral foraminal narrowing and moderate canal stenosis. 2. No mass or fluid collection identified.  No abnormal enhancement. 3. Poor visualization of the right vertebral artery flow void, which is of uncertain clinical significance. A CT angiogram of the neck could be considered if the patient's neurologic symptoms clinically warrant further evaluation. Electronically Signed   By: Davina Poke M.D.   On: 07/03/2019 15:02   Dg Chest Port 1 View  Result Date: 07/03/2019 CLINICAL DATA:  Onset fever and weakness yesterday. EXAM: PORTABLE CHEST 1 VIEW COMPARISON:  None. FINDINGS: Port-A-Cath is in place. Postoperative change left upper lobe noted. Lungs are clear. Heart size is normal. No pneumothorax or pleural effusion. No acute bony abnormality. Remote healed left sixth rib fracture noted. IMPRESSION: No acute disease. Electronically Signed   By: Inge Rise M.D.   On: 07/03/2019 10:12    Pending Labs Unresulted Labs (From admission, onward)    Start     Ordered   07/04/19 0500  Comprehensive metabolic panel  Tomorrow morning,   R     07/03/19 1548   07/04/19 0500  CBC  Tomorrow morning,   R     07/03/19 1548   07/04/19 0500  Magnesium  Tomorrow morning,   R     07/03/19 1548   07/03/19 1700  Comprehensive metabolic panel  Once-Timed,   STAT     07/03/19 1548   07/03/19 1700  Protime-INR  Once,   STAT     07/03/19 1548   07/03/19 1505  Phosphorus  Add-on,   AD     07/03/19 1504   07/03/19 1145  MRSA PCR Screening  Once,   STAT    Question:  Patient immune status  Answer:   Immunocompromised   07/03/19 1039   07/03/19 0938  Blood Culture (routine x 2)  BLOOD CULTURE X 2,   STAT     07/03/19 0938   07/03/19 0938  Urinalysis, Routine w reflex microscopic  ONCE - STAT,   STAT     07/03/19 0938   07/03/19 0938  Urine culture  ONCE - STAT,   STAT     07/03/19 0938          Vitals/Pain Today's Vitals   07/03/19 1230 07/03/19 1245 07/03/19 1300 07/03/19 1315  BP: 140/67 (!) 167/75 (!) 158/81 (!) 166/76  Pulse: 78 78 80 78  Resp: 19 (!) 23 (!) 28 (!) 24  Temp:      TempSrc:      SpO2: 98% 93% (!) 89% 94%  Weight:      Height:      PainSc:        Isolation Precautions No active isolations  Medications Medications  vancomycin (VANCOCIN) 1,250 mg in sodium chloride 0.9 % 250 mL IVPB (has no administration in time range)  ceFEPIme (MAXIPIME) 2 g in sodium chloride 0.9 % 100 mL IVPB (has no administration in time range)  buPROPion (WELLBUTRIN XL) 24 hr tablet 150 mg (has no administration in time range)  escitalopram (LEXAPRO) tablet 20 mg (has no administration in time range)  levothyroxine (SYNTHROID) tablet 137 mcg (has no administration in time range)  rivaroxaban (XARELTO) tablet 20 mg (has no administration in time range)  gabapentin (NEURONTIN) capsule 600 mg (has no administration in time range)  lidocaine-prilocaine (EMLA) cream 1 application (has no administration in time range)  sodium chloride flush (NS)  0.9 % injection 3 mL (has no administration in time range)  magnesium sulfate IVPB 4 g 100 mL (has no administration in time range)  acetaminophen (TYLENOL) tablet 650 mg (has no administration in time range)    Or  acetaminophen (TYLENOL) suppository 650 mg (has no administration in time range)  senna-docusate (Senokot-S) tablet 1 tablet (has no administration in time range)  ondansetron (ZOFRAN) tablet 4 mg (has no administration in time range)    Or  ondansetron (ZOFRAN) injection 4 mg (has no administration in time range)  potassium  chloride SA (K-DUR) CR tablet 40 mEq (has no administration in time range)  0.9 %  sodium chloride infusion (has no administration in time range)  sodium chloride 0.9 % bolus 500 mL (0 mLs Intravenous Stopped 07/03/19 1129)  vancomycin (VANCOCIN) 1,750 mg in sodium chloride 0.9 % 500 mL IVPB (1,750 mg Intravenous New Bag/Given 07/03/19 1129)  ceFEPIme (MAXIPIME) 2 g in sodium chloride 0.9 % 100 mL IVPB (0 g Intravenous Stopped 07/03/19 1128)  lactated ringers bolus 500 mL (500 mLs Intravenous New Bag/Given 07/03/19 1303)  gadobutrol (GADAVIST) 1 MMOL/ML injection 7.5 mL (7.5 mLs Intravenous Contrast Given 07/03/19 1440)    Mobility walks High fall risk   Focused Assessments    R Recommendations: See Admitting Provider Note  Report given to: Jeanella Cara RN  Additional Notes:

## 2019-07-03 NOTE — H&P (Signed)
Date: 07/03/2019               Patient Name:  Rhonda Hale MRN: [id number]  DOB: Feb 21, 1946 Age / Sex: 73 y.o., female   PCP: System, Pcp Not In         Medical Service: Internal Medicine Teaching Service         Attending Physician: Dr. Lynnae January, Real Cons, MD    First Contact: Dr. Madilyn Fireman Pager:  [phone]  Second Contact: Dr. Frederico Hamman Pager: [phone]       After Hours (After 5p/  First Contact Pager: [phone]  weekends / holidays): Second Contact Pager: [phone]   Chief Complaint: fever  History of Present Illness: Rhonda Hale is a 73 yo F with a PMHx of metastatic ovarian cancer currently on treatment with paclitaxel/avastin who presented to ED with chief complaint of fever. She reports a 2 day hx of decreased appetite, decreased oral intake, fatigue, chills and fever. Her daughter reports that the patient is always cold but that Rhonda Hale was shivering more than usual and was prompted to take her temperature which was 103 most recently this morning. The daughter also reports a fall this morning. She noticed the patient's legs wobbling and the patient slid to the floor. She had a fall secondary to orthostatic hypotension last month. She has rhinorrhea at baseline which has not increased in severity. She has an intermittent cough which is also unchanged from baseline. She denies sore throat, chest pain, trouble breathing, abdominal pain, dysuria, diarrhea and back pain. Pt has had no sick contacts. The pt has had a headache but that is not new. She endorses some recent back and neck pain that she attributes to the taxol but in the last day or so she has been experiencing severe left neck pain that is worsened with lateral movement and even with arm movements. She has not tried anything to help with the pain such as a heating pad. She has no other complaints or concerns.  Meds:  Current Meds  Medication Sig  . buPROPion (WELLBUTRIN XL) 150 MG 24 hr tablet Take 150 mg by mouth daily.  .  calcitRIOL (ROCALTROL) 0.5 MCG capsule Take 0.5 mcg by mouth 2 (two) times a day.  . Calcium Carb-Cholecalciferol (CALCIUM-VITAMIN D) 600-400 MG-UNIT TABS Take 1 tablet by mouth 2 (two) times a day.  Marland Kitchen dexamethasone (DECADRON) 4 MG tablet Take 4 mg by mouth as needed. After Chemotherapy  . escitalopram (LEXAPRO) 20 MG tablet Take 20 mg by mouth daily.  Marland Kitchen gabapentin (NEURONTIN) 300 MG capsule Take 600 mg by mouth at bedtime.  Marland Kitchen levothyroxine (SYNTHROID) 137 MCG tablet Take 137 mcg by mouth daily before breakfast.  . lidocaine-prilocaine (EMLA) cream Apply 1 application topically as needed.  . ondansetron (ZOFRAN) 8 MG tablet Take 8 mg by mouth every 8 (eight) hours as needed for nausea or vomiting.  . rivaroxaban (XARELTO) 20 MG TABS tablet Take 20 mg by mouth daily.   Allergies: Allergies as of 07/03/2019 - Review Complete 07/03/2019  Allergen Reaction Noted  . Atorvastatin Other (See Comments) 06/21/2017   Past Medical History:  Diagnosis Date  . Cancer ([place])    Onc Hx: Clinical stage from 07/03/2017: FIGO Stage IVB (cT3c(m), cJ1a, cM1b), BRCA negative, MSI stable, ER+, PR-, PD-L1-  Neoadjuvant chemo with carbotaxel x4 09/2017 surgery, xlap, TAH, BSO, Omx, splenectomy   10/2017-11/2017 Carbotaxel x3  11/2107 Remission  07/2018 Relapse, PET with scattered peritoneal, mesenteric and possibly omental tumor nodules  08/2018 - 02/2019 Carbodoxil x 7  02/2019 Progression, CA 125 of 437, PET with retroperitoneal lymph node recurrence  03/2019 Chemotherapy Taxol + Avastin x 3 cycles, last one 05/15/2019  Social History:  Pt lives with daughter since her fall last month No sick contacts  Review of Systems: A complete ROS was negative except as per HPI.  Physical Exam: Blood pressure (!) 166/76, pulse 78, temperature 100.3 F (37.9 C), temperature source Rectal, resp. rate (!) 24, height _0  (1.575 m), weight 78.5 kg, SpO2 94 %.  Physical Exam  Constitutional: She is  well-developed, well-nourished, and in no distress. No distress.  HENT:  Head: Normocephalic and atraumatic.  Eyes: EOM are normal.  Neck:  Abnormal ROM secondary to pain  Cardiovascular: Normal rate, regular rhythm, normal heart sounds and intact distal pulses.  No murmur heard. Pulmonary/Chest: Effort normal and breath sounds normal.  Basilar crackles  Abdominal: Soft. Bowel sounds are normal. She exhibits no distension. There is no abdominal tenderness.  Musculoskeletal: Normal range of motion.        General: No edema.  Skin: Skin is warm and dry. She is not diaphoretic. There is erythema.  Erythematous papular rashes on bilateral arms and ankles    Labs:  Results for orders placed or performed during the hospital encounter of 07/03/19 (from the past 72 hour(s))  Lactic acid, plasma     Status: None   Collection Time: 07/03/19  9:38 AM  Result Value Ref Range   Lactic Acid, Venous 1.7 0.5 - 1.9 mmol/L    Comment: Performed at [facility], [address] [place], [place] [address]  Comprehensive metabolic panel     Status: Abnormal   Collection Time: 07/03/19  9:38 AM  Result Value Ref Range   Sodium 135 135 - 145 mmol/L   Potassium 3.3 (L) 3.5 - 5.1 mmol/L   Chloride 96 (L) 98 - 111 mmol/L   CO2 27 22 - 32 mmol/L   Glucose, Bld 136 (H) 70 - 99 mg/dL   BUN 13 8 - 23 mg/dL   Creatinine, Ser 1.23 (H) 0.44 - 1.00 mg/dL   Calcium 11.4 (H) 8.9 - 10.3 mg/dL   Total Protein 5.7 (L) 6.5 - 8.1 g/dL   Albumin 2.7 (L) 3.5 - 5.0 g/dL   AST 19 15 - 41 U/L   ALT 17 0 - 44 U/L   Alkaline Phosphatase 73 38 - 126 U/L   Total Bilirubin 0.4 0.3 - 1.2 mg/dL   GFR calc non Af Amer 43 (L) >60 mL/min   GFR calc Af Amer 50 (L) >60 mL/min   Anion gap 12 5 - 15    Comment: Performed at [facility], [place] [address] [place], [place] [address]  CBC WITH DIFFERENTIAL     Status: Abnormal   Collection Time: 07/03/19  9:38 AM  Result Value Ref Range   WBC 5.4 4.0 - 10.5 K/uL   RBC  2.59 (L) 3.87 - 5.11 MIL/uL   Hemoglobin 8.9 (L) 12.0 - 15.0 g/dL   HCT 25.9 (L) 36.0 - 46.0 %   MCV 100.0 80.0 - 100.0 fL   MCH 34.4 (H) 26.0 - 34.0 pg   MCHC 34.4 30.0 - 36.0 g/dL   RDW 18.6 (H) 11.5 - 15.5 %   Platelets 315 150 - 400 K/uL   nRBC 17.5 (H) 0.0 - 0.2 %   Neutrophils Relative % 71 %   Neutro Abs 3.8 1.7 - 7.7  K/uL   Lymphocytes Relative 18 %   Lymphs Abs 1.0 0.7 - 4.0 K/uL   Monocytes Relative 1 %   Monocytes Absolute 0.1 0.1 - 1.0 K/uL   Eosinophils Relative 0 %   Eosinophils Absolute 0.0 0.0 - 0.5 K/uL   Basophils Relative 5 %   Basophils Absolute 0.3 (H) 0.0 - 0.1 K/uL   WBC Morphology See Note     Comment: Mild Left Shift. 1 to 5% Metas and Myelos, Occ Pro Noted.  Toxic Granulation    nRBC 40 (H) 0 /100 WBC   Metamyelocytes Relative 1 %   Myelocytes 2 %   Promyelocytes Relative 2 %   Abs Immature Granulocytes 0.30 (H) 0.00 - 0.07 K/uL   Schistocytes PRESENT    Tear Drop Cells PRESENT    Pappenheimer Bodies PRESENT    Tammy Sours Bodies PRESENT    Polychromasia PRESENT     Comment: Performed at [facility], [place] [address] [place], [place] [address]  APTT     Status: Abnormal   Collection Time: 07/03/19  9:38 AM  Result Value Ref Range   aPTT 42 (H) 24 - 36 seconds    Comment:        IF BASELINE aPTT IS ELEVATED, SUGGEST PATIENT RISK ASSESSMENT BE USED TO DETERMINE APPROPRIATE ANTICOAGULANT THERAPY. Performed at [facility], [place] [address] [place], [place] [address]   Protime-INR     Status: Abnormal   Collection Time: 07/03/19  9:38 AM  Result Value Ref Range   Prothrombin Time 21.0 (H) 11.4 - 15.2 seconds   INR 1.8 (H) 0.8 - 1.2    Comment: (NOTE) INR goal varies based on device and disease states. Performed at [facility], [place] [address] [place], [place] [address]   SARS Coronavirus 2 (CEPHEID- Performed in [place] hospital lab), Hosp Order     Status: None   Collection Time: 07/03/19 10:13 AM   Specimen:  Nasopharyngeal Swab  Result Value Ref Range   SARS Coronavirus 2 NEGATIVE NEGATIVE    EKG: personally reviewed my interpretation is ectopic atrial rhythm.   CXR: personally reviewed my interpretation is no acute disease.  MR Cervical Spine W WO: 1. Multilevel cervical spondylosis most pronounced at the C5-6 level where there is moderate bilateral foraminal narrowing and moderate canal stenosis 2. No mass or fluid collection identified. No abnormal enhancement.  3. Poor visualization of the right vertebral artery flow void, which is of uncertain clinical significant. A CT angiogram of the neck could be considered if the patient's neurologic symptoms clinically warrant further evaluation.  Assessment:  Ms. Ellegood is a 73 yo F with a PMHx of metastatic ovarian cancer and PE on Xarelto who presents with fever with no infectious sx being treated with broad spectrum antibiotics with blood and urine cx pending.  Plan by Problem:  Fever in Cancer Patient/Metastatic Ovarian Cancer: -patient presents with fever of 103 without any infectious sx with most recent chemo on 07/15   -CBC with normal WBC but patient receiving chemo -receiving broad coverage with vanc/cefepime -COVID negative -UA pending -MRSA screening pending -blood cx, urine cx pending  Decreased Appetite/Fatigue/Weakness/Fall: -patient reports a lack of taste secondary to chemo that is chronic however appetite has been decreased more so than usual in the last few days -endorses decreased food and water intake which has likely caused increased fatigue, weakness and fall -slight bump in creatinine from 1.07 to 1.23 suggesting dehydration -prior hx  of fall secondary to orthostatic hypotension -bps normal here so far  -fluids to rehydrate, nutrition, PT/OT  Neck Pain: -pt reports hx of back and neck pain for the last few months that acutely worsened in the last day or two such that she has trouble rotating her neck  -MRI  Cervical Spine W WO showed cervical spondylosis most pronounced at the C5-6 level where there is moderate bilateral foraminal narrowing and moderate canal stenosis; no mass or fluid collection identified -continue home gabapentin; k pad ordered  Hypercalcemia: -Calcium 11.4 at admission treated with IV fluids, when corrected for hypoalbuminemia calcium even higher at around 12.5 -elevated at baseline, 10.5 on 07/15, on calcitriol -will follow with BMP  Hypomagnesemia: -chronic problem -will monitor with daily mag and replete as needed  Major Depression: -stable, not currently sx -continue home synthroid  Hypothyroidism: -sp thyroidectomy for thyroid nodules -continue home synthroid   Principal Problem:   Fever Active Problems:   AKI (acute kidney injury) ([place])   Hypercalcemia   Hypomagnesemia, chronic  Dispo: Admit patient to Inpatient with expected length of stay greater than 2 midnights.  Signed: Al Decant, MD 07/03/2019, 4:11 PM  Pager: [phone]

## 2019-07-03 NOTE — Progress Notes (Signed)
Rhonda Hale [id number] Admission Data: 07/03/2019 6:56 PM Attending Provider: Bartholomew Crews, MD  TWS:[id number], Pcp Not In Consults/ Treatment Team:   Rhonda Hale is a 73 y.o. female patient admitted from ED awake, alert  & orientated  X 3,  Partial Code, VSS - Blood pressure (!) 161/81, pulse (!) 105, temperature (!) 102.5 F (39.2 C), temperature source Axillary, resp. rate 16, height 5\' 2"  (1.575 m), weight 78.5 kg, SpO2 (!) 88 %., O2    2 L nasal cannular, no c/o shortness of breath, no c/o chest pain, no distress noted. Tele # 35 placed and pt is currently running:normal sinus rhythm.   IV site WDL:  Right chest port with a transparent dsg that's clean dry and intact.  Allergies:   Allergies  Allergen Reactions  . Atorvastatin Other (See Comments)    Severe heartburn, reflux     Past Medical History:  Diagnosis Date  . Cancer [facility])     History:  obtained from chart review. Tobacco/alcohol: denied none  Pt orientation to unit, room and routine. Information packet given to patient/family and safety video watched.  Admission INP armband ID verified with patient/family, and in place. SR up x 2, fall risk assessment complete with Patient and family verbalizing understanding of risks associated with falls. Pt verbalizes an understanding of how to use the call bell and to call for help before getting out of bed.  Skin, clean-dry- intact without evidence of bruising, or skin tears.   No evidence of skin break down noted on exam. no rashes, no ecchymoses, no petechiae, no nodules, no jaundice, no purpura, no wounds Received chemotherapy 1 week ago. Will take chemo precautions   Will cont to monitor and assist as needed.  Rhonda Marseilles Tamala Julian, MSN, RN, Rhonda Hale, Rhonda Hale  07/03/2019 6:56 PM

## 2019-07-04 ENCOUNTER — Inpatient Hospital Stay (HOSPITAL_COMMUNITY): Payer: Medicare Other

## 2019-07-04 DIAGNOSIS — R21 Rash and other nonspecific skin eruption: Secondary | ICD-10-CM

## 2019-07-04 DIAGNOSIS — Z90710 Acquired absence of both cervix and uterus: Secondary | ICD-10-CM

## 2019-07-04 DIAGNOSIS — Z9081 Acquired absence of spleen: Secondary | ICD-10-CM

## 2019-07-04 DIAGNOSIS — R531 Weakness: Secondary | ICD-10-CM

## 2019-07-04 DIAGNOSIS — R413 Other amnesia: Secondary | ICD-10-CM

## 2019-07-04 DIAGNOSIS — Z79899 Other long term (current) drug therapy: Secondary | ICD-10-CM

## 2019-07-04 DIAGNOSIS — M542 Cervicalgia: Secondary | ICD-10-CM

## 2019-07-04 DIAGNOSIS — R4701 Aphasia: Secondary | ICD-10-CM

## 2019-07-04 DIAGNOSIS — L909 Atrophic disorder of skin, unspecified: Secondary | ICD-10-CM

## 2019-07-04 DIAGNOSIS — R32 Unspecified urinary incontinence: Secondary | ICD-10-CM

## 2019-07-04 DIAGNOSIS — Z9181 History of falling: Secondary | ICD-10-CM

## 2019-07-04 LAB — CBC
HCT: 24.5 % — ABNORMAL LOW (ref 36.0–46.0)
Hemoglobin: 8.1 g/dL — ABNORMAL LOW (ref 12.0–15.0)
MCH: 33.9 pg (ref 26.0–34.0)
MCHC: 33.1 g/dL (ref 30.0–36.0)
MCV: 102.5 fL — ABNORMAL HIGH (ref 80.0–100.0)
Platelets: 318 10*3/uL (ref 150–400)
RBC: 2.39 MIL/uL — ABNORMAL LOW (ref 3.87–5.11)
RDW: 18.9 % — ABNORMAL HIGH (ref 11.5–15.5)
WBC: 7.6 10*3/uL (ref 4.0–10.5)
nRBC: 22 % — ABNORMAL HIGH (ref 0.0–0.2)

## 2019-07-04 LAB — IRON AND TIBC
Iron: 16 ug/dL — ABNORMAL LOW (ref 28–170)
Saturation Ratios: 7 % — ABNORMAL LOW (ref 10.4–31.8)
TIBC: 232 ug/dL — ABNORMAL LOW (ref 250–450)
UIBC: 216 ug/dL

## 2019-07-04 LAB — URINE CULTURE: Culture: NO GROWTH

## 2019-07-04 LAB — COMPREHENSIVE METABOLIC PANEL
ALT: 16 U/L (ref 0–44)
AST: 21 U/L (ref 15–41)
Albumin: 2.4 g/dL — ABNORMAL LOW (ref 3.5–5.0)
Alkaline Phosphatase: 66 U/L (ref 38–126)
Anion gap: 9 (ref 5–15)
BUN: 13 mg/dL (ref 8–23)
CO2: 25 mmol/L (ref 22–32)
Calcium: 9.2 mg/dL (ref 8.9–10.3)
Chloride: 103 mmol/L (ref 98–111)
Creatinine, Ser: 1.32 mg/dL — ABNORMAL HIGH (ref 0.44–1.00)
GFR calc Af Amer: 46 mL/min — ABNORMAL LOW (ref >60)
GFR calc non Af Amer: 39 mL/min — ABNORMAL LOW (ref >60)
Glucose, Bld: 109 mg/dL — ABNORMAL HIGH (ref 70–99)
Potassium: 3.3 mmol/L — ABNORMAL LOW (ref 3.5–5.1)
Sodium: 137 mmol/L (ref 135–145)
Total Bilirubin: 0.2 mg/dL — ABNORMAL LOW (ref 0.3–1.2)
Total Protein: 5.3 g/dL — ABNORMAL LOW (ref 6.5–8.1)

## 2019-07-04 LAB — VITAMIN B12: Vitamin B-12: 813 pg/mL (ref 180–914)

## 2019-07-04 LAB — FERRITIN: Ferritin: 628 ng/mL — ABNORMAL HIGH (ref 11–307)

## 2019-07-04 LAB — PATHOLOGIST SMEAR REVIEW

## 2019-07-04 LAB — MAGNESIUM: Magnesium: 1.8 mg/dL (ref 1.7–2.4)

## 2019-07-04 LAB — MRSA PCR SCREENING: MRSA by PCR: NEGATIVE

## 2019-07-04 MED ORDER — ADULT MULTIVITAMIN W/MINERALS CH
1.0000 | ORAL_TABLET | Freq: Every day | ORAL | Status: DC
  Administered 2019-07-04 – 2019-07-17 (×11): 1 via ORAL
  Filled 2019-07-04 (×14): qty 1

## 2019-07-04 MED ORDER — CYCLOBENZAPRINE HCL 5 MG PO TABS
5.0000 mg | ORAL_TABLET | Freq: Three times a day (TID) | ORAL | Status: DC | PRN
  Administered 2019-07-04: 5 mg via ORAL
  Filled 2019-07-04: qty 1

## 2019-07-04 MED ORDER — SODIUM CHLORIDE 0.9 % IV SOLN
INTRAVENOUS | Status: DC
  Administered 2019-07-04 – 2019-07-06 (×3): via INTRAVENOUS

## 2019-07-04 MED ORDER — POTASSIUM CHLORIDE CRYS ER 20 MEQ PO TBCR
40.0000 meq | EXTENDED_RELEASE_TABLET | Freq: Two times a day (BID) | ORAL | Status: AC
  Administered 2019-07-04 (×2): 40 meq via ORAL
  Filled 2019-07-04 (×2): qty 2

## 2019-07-04 MED ORDER — ENSURE ENLIVE PO LIQD
237.0000 mL | Freq: Two times a day (BID) | ORAL | Status: DC
  Administered 2019-07-04: 237 mL via ORAL

## 2019-07-04 NOTE — Progress Notes (Signed)
Initial Nutrition Assessment  RD working remotely.  DOCUMENTATION CODES:   Obesity unspecified  INTERVENTION:   -Ensure Enlive po BID, each supplement provides 350 kcal and 20 grams of protein -MVI with minerals daily -Continue liberalized diet of regular for widest variety of food selections  NUTRITION DIAGNOSIS:   Increased nutrient needs related to cancer and cancer related treatments as evidenced by estimated needs.  GOAL:   Patient will meet greater than or equal to 90% of their needs  MONITOR:   PO intake, Supplement acceptance, Labs, Weight trends, Skin, I & O's  REASON FOR ASSESSMENT:   Consult Assessment of nutrition requirement/status  ASSESSMENT:   Rhonda Hale is a 73 yo F with a PMHx of metastatic ovarian cancer and PE on Xarelto who presents with fever with no infectious sx being treated with broad spectrum antibiotics with blood and urine cx pending.  Pt admitted with fever; currently undergoing treatment for metastatic ovarian cancer (paclitaxel/avastin).   Reviewed I/O's: +2.8 L x 24 hours  Attempted to speak with pt by phone, however, no answer. Per nursing notes, pt with some confusion today.   Per H&P, pt endorses a poor appetite over the past 2 days.   Reviewed wt hx, however, no wt other than present wt available to assess for weight changes.   No PO intake currently available in meal completion documentation records. Pt with increased nutrient needs due to cancer and cancer related treatments and would greatly benefit from addition of oral nutrition supplements.   Albumin has a half-life of 21 days and is strongly affected by stress response and inflammatory process, therefore, do not expect to see an improvement in this lab value during acute hospitalization. When a patient presents with low albumin, it is likely skewed due to the acute inflammatory response. Note that low albumin is no longer used to diagnose malnutrition; [place] uses the new  malnutrition guidelines published by the American Society for Parenteral and Enteral Nutrition (A.S.P.E.N.) and the Academy of Nutrition and Dietetics (AND).    Labs reviewed: K: 3.3, Phos and Mg WDL.   NUTRITION - FOCUSED PHYSICAL EXAM:    Most Recent Value  Orbital Region  Unable to assess  Upper Arm Region  Unable to assess  Thoracic and Lumbar Region  Unable to assess  Buccal Region  Unable to assess  Temple Region  Unable to assess  Clavicle Bone Region  Unable to assess  Clavicle and Acromion Bone Region  Unable to assess  Scapular Bone Region  Unable to assess  Dorsal Hand  Unable to assess  Patellar Region  Unable to assess  Anterior Thigh Region  Unable to assess  Posterior Calf Region  Unable to assess  Edema (RD Assessment)  Unable to assess  Hair  Unable to assess  Eyes  Unable to assess  Mouth  Unable to assess  Skin  Unable to assess  Nails  Unable to assess       Diet Order:   Diet Order            Diet regular Room service appropriate? Yes with Assist; Fluid consistency: Thin  Diet effective now              EDUCATION NEEDS:   No education needs have been identified at this time  Skin:  Skin Assessment: Reviewed RN Assessment  Last BM:  07/03/19  Height:   Ht Readings from Last 1 Encounters:  07/03/19 5\' 2"  (1.575 m)    Weight:  Wt Readings from Last 1 Encounters:  07/03/19 78.5 kg    Ideal Body Weight:  50 kg  BMI:  Body mass index is 31.64 kg/m.  Estimated Nutritional Needs:   Kcal:  2072-1828  Protein:  85-100 grams  Fluid:  > 1.7 L    Ronee Ranganathan A. Jimmye Norman, RD, LDN, [place] Registered Dietitian II Certified Diabetes Care and Education Specialist Pager: [phone] After hours Pager: [phone]

## 2019-07-04 NOTE — Progress Notes (Addendum)
13: Spoke with patient daughter Daleen Snook. Updated on condition. States she is to come to visit patient as patient is confused. Informed daughter Daleen Snook that this nurse will check the visitation policy and give her a call back.  102: Spoke with daughter Daleen Snook. Will be up to visit patient.

## 2019-07-04 NOTE — Evaluation (Signed)
Physical Therapy Evaluation Patient Details Name: Rhonda Hale MRN: [id number] DOB: 1946/07/07 Today's Date: 07/04/2019   History of Present Illness  Rhonda Hale is a 73 y.o. woman with PMHx significant for ovarian cancer status post hysterectomy and splenectomy, on chemotherapy, presented to ED with fever and chills, maximum temperature 103F, and nontraumatic fall at home prior to arrival. Associated symptoms include decreased appetite, decreased oral intake, fatigue and weakness for two days.     Clinical Impression  Pt admitted with above diagnosis. Pt currently with functional limitations due to the deficits listed below (see PT Problem List). PTA, from home staying with daughter now has stairs at home. Today, weak, deconditioned with decreased tolerance to acviity, ambulating in room with HHA, min guarding. Daughter present agreeable to care giving and pt returning to home once able. rec HHPT as patient will be ongoing with chemo.  Pt will benefit from skilled PT to increase their independence and safety with mobility to allow discharge to the venue listed below.       Follow Up Recommendations Home health PT;Supervision/Assistance - 24 hour    Equipment Recommendations  None recommended by PT    Recommendations for Other Services       Precautions / Restrictions Precautions Precautions: Fall Restrictions Weight Bearing Restrictions: No      Mobility  Bed Mobility Overal bed mobility: Modified Independent             General bed mobility comments: increased time and effort  Transfers Overall transfer level: Modified independent                  Ambulation/Gait Ambulation/Gait assistance: Min guard Gait Distance (Feet): 15 Feet Assistive device: 1 person hand held assist Gait Pattern/deviations: Step-to pattern Gait velocity: decreased   General Gait Details: mild unsteadiness, with HHA no need for AD, if solo/unsupervised would beneift from RW at this  time  Stairs            Wheelchair Mobility    Modified Rankin (Stroke Patients Only)       Balance Overall balance assessment: History of Falls                                           Pertinent Vitals/Pain      Home Living Family/patient expects to be discharged to:: Private residence Living Arrangements: Children Available Help at Discharge: Family;Available 24 hours/day Type of Home: House Home Access: Level entry     Home Layout: Two level Home Equipment: Walker - 2 wheels;Shower seat      Prior Function                 Hand Dominance        Extremity/Trunk Assessment   Upper Extremity Assessment Upper Extremity Assessment: Defer to OT evaluation    Lower Extremity Assessment Lower Extremity Assessment: Generalized weakness       Communication      Cognition Arousal/Alertness: Awake/alert Behavior During Therapy: WFL for tasks assessed/performed Overall Cognitive Status: Within Functional Limits for tasks assessed                                        General Comments      Exercises     Assessment/Plan    PT Assessment Patient  needs continued PT services  PT Problem List Decreased strength       PT Treatment Interventions DME instruction    PT Goals (Current goals can be found in the Care Plan section)  Acute Rehab PT Goals Patient Stated Goal: go home PT Goal Formulation: With patient/family Time For Goal Achievement: 07/18/19 Potential to Achieve Goals: Good    Frequency Min 3X/week   Barriers to discharge        Co-evaluation               AM-PAC PT "6 Clicks" Mobility  Outcome Measure Help needed turning from your back to your side while in a flat bed without using bedrails?: A Little Help needed moving from lying on your back to sitting on the side of a flat bed without using bedrails?: A Little Help needed moving to and from a bed to a chair (including a  wheelchair)?: A Little Help needed standing up from a chair using your arms (e.g., wheelchair or bedside chair)?: A Little Help needed to walk in hospital room?: A Lot Help needed climbing 3-5 steps with a railing? : A Lot 6 Click Score: 16    End of Session Equipment Utilized During Treatment: Gait belt Activity Tolerance: Patient tolerated treatment well Patient left: in bed;with call bell/phone within reach Nurse Communication: Mobility status PT Visit Diagnosis: Unsteadiness on feet (R26.81)    Time: 1245-1310 PT Time Calculation (min) (ACUTE ONLY): 25 min   Charges:   PT Evaluation $PT Eval Moderate Complexity: 1 Mod PT Treatments $Gait Training: 8-22 mins        Reinaldo Berber, PT, DPT Acute Rehabilitation Services Pager: [phone] Office: [place] 07/04/2019, 3:05 PM

## 2019-07-04 NOTE — Progress Notes (Signed)
Occupational Therapy Evaluation Patient Details Name: Rhonda Hale MRN: [id number] DOB: March 17, 1946 Today's Date: 07/04/2019    History of Present Illness Rhonda Hale is a 73 y.o. woman with PMHx significant for ovarian cancer status post hysterectomy and splenectomy, on chemotherapy, presented to ED with fever and chills, maximum temperature 103F, and nontraumatic fall at home prior to arrival. Associated symptoms include decreased appetite, decreased oral intake, fatigue and weakness for two days.    Clinical Impression   Pt admitted with above diagnosis. PTA, pt is independent with BADLs but does require occasional assist due to fatigue after chemo. Pt is living with daughter since undergoing chemo. Pt presents with decreased activity tolerance and fatigue. Evaluation limited as pt declined OOB mobility due to fatigue and being cold. Pt will benefit from continued acute OT services to maximize safety and independence with ADLs prior to return home with family.    Follow Up Recommendations  Home health OT;Supervision/Assistance - 24 hour    Equipment Recommendations  3 in 1 bedside commode    Recommendations for Other Services       Precautions / Restrictions Precautions Precautions: Fall Restrictions Weight Bearing Restrictions: No      Mobility Bed Mobility Overal bed mobility: Modified Independent             General bed mobility comments: increased time and effort  Transfers Overall transfer level: Modified independent               General transfer comment: pt declined    Balance Overall balance assessment: History of Falls                                         ADL either performed or assessed with clinical judgement   ADL Overall ADL's : Needs assistance/impaired     Grooming: Supervision/safety;Sitting   Upper Body Bathing: Supervision/ safety;Sitting   Lower Body Bathing: Minimal assistance;Sitting/lateral leans   Upper  Body Dressing : Supervision/safety;Sitting   Lower Body Dressing: Minimal assistance;Sitting/lateral leans                 General ADL Comments: Pt sat EOB ~5 minutes. Politely declines OOB mobility during evaluation ("I'm so cold and tired.")     Vision         Perception     Praxis      Pertinent Vitals/Pain Pain Assessment: No/denies pain     Hand Dominance     Extremity/Trunk Assessment Upper Extremity Assessment Upper Extremity Assessment: Generalized weakness   Lower Extremity Assessment Lower Extremity Assessment: Defer to PT evaluation       Communication Communication Communication: No difficulties   Cognition Arousal/Alertness: Awake/alert Behavior During Therapy: WFL for tasks assessed/performed Overall Cognitive Status: Within Functional Limits for tasks assessed                                 General Comments: Slightly slow to process questions but [facility].    General Comments       Exercises     Shoulder Instructions      Home Living Family/patient expects to be discharged to:: Private residence Living Arrangements: Children Available Help at Discharge: Family;Available 24 hours/day Type of Home: House Home Access: Level entry     Home Layout: Two level;1/2 bath on main level   Alternate Level Stairs-Rails:  Can reach both Bathroom Shower/Tub: Teacher, early years/pre: Standard     Home Equipment: Environmental consultant - 2 wheels;Shower seat   Additional Comments: Pt living with daughter while undergoing chemo.      Prior Functioning/Environment Level of Independence: Independent        Comments: Intermittent use of RW. Occasionally requires assist with ADLs, especially after chemo.         OT Problem List: Decreased activity tolerance;Decreased knowledge of use of DME or AE      OT Treatment/Interventions: Self-care/ADL training;DME and/or AE instruction;Therapeutic activities;Patient/family education;Energy  conservation    OT Goals(Current goals can be found in the care plan section) Acute Rehab OT Goals Patient Stated Goal: go home OT Goal Formulation: With patient Time For Goal Achievement: 07/18/19 Potential to Achieve Goals: Good  OT Frequency: Min 2X/week   Barriers to D/C:            Co-evaluation              AM-PAC OT "6 Clicks" Daily Activity     Outcome Measure Help from another person eating meals?: None Help from another person taking care of personal grooming?: None Help from another person toileting, which includes using toliet, bedpan, or urinal?: A Little Help from another person bathing (including washing, rinsing, drying)?: A Little Help from another person to put on and taking off regular upper body clothing?: None Help from another person to put on and taking off regular lower body clothing?: A Little 6 Click Score: 21   End of Session    Activity Tolerance: Patient limited by fatigue Patient left: in bed;with call bell/phone within reach;with family/visitor present  OT Visit Diagnosis: Muscle weakness (generalized) (M62.81)                Time: 9323-5573 OT Time Calculation (min): 18 min Charges:  OT General Charges $OT Visit: 1 Visit OT Evaluation $OT Eval Moderate Complexity: 1 Mod    Darrol Jump OTR/L [place] [phone] 07/04/2019, 3:53 PM

## 2019-07-04 NOTE — Progress Notes (Addendum)
Subjective: Patient denies acute events overnight, though she does not remember many of the events from the last few days. Patient is eating and drinking well and denies new respiratory, urinary symptoms or rash. Patient does describe new expressive aphasia and memory loss stating that is she knows the words she wants to say, but she feels that they are floating around in her head.   Patient's daughter, Daleen Snook, was present in the room and was able to provide collateral history. She reports that patient has "chemo brain" that is associated with her treatments, but her trouble remembering recent events is new. Patient has urinary incontinence at baseline, but Daleen Snook does remember a strong odor to her urine last night.    Objective:  Vital signs in last 24 hours: Vitals:   07/03/19 2145 07/04/19 0543 07/04/19 0654 07/04/19 0743  BP: (!) 135/56 (!) 186/95    Pulse: 90 99    Resp:      Temp: 98.7 F (37.1 C) (!) 100.6 F (38.1 C) (!) 101.1 F (38.4 C) (!) 100.4 F (38 C)  TempSrc: Oral Oral Axillary Axillary  SpO2: 90% 90%    Weight:      Height:       Weight change:   Intake/Output Summary (Last 24 hours) at 07/04/2019 1050 Last data filed at 07/04/2019 1000 Gross per 24 hour  Intake 2823.71 ml  Output 550 ml  Net 2273.71 ml   Physical Exam Vitals signs reviewed.  Constitutional:      General: She is not in acute distress.    Appearance: She is obese.  HENT:     Head: Normocephalic and atraumatic.     Right Ear: Tympanic membrane and ear canal normal. No drainage or swelling. Tympanic membrane is not erythematous or bulging.     Left Ear: Tympanic membrane and ear canal normal. No drainage or swelling. Tympanic membrane is not erythematous or bulging.     Mouth/Throat:     Mouth: Mucous membranes are moist. No oral lesions.     Comments: no evidence of thrush Cardiovascular:     Rate and Rhythm: Normal rate and regular rhythm.  Pulmonary:     Effort: Pulmonary effort is  normal.     Breath sounds: Normal breath sounds. No wheezing.  Abdominal:     General: Bowel sounds are normal.  Skin:    General: Skin is warm.     Findings: Rash present. Rash is papular.     Comments: wrinkled, atrophic skin with decreased elasticity to upper extremities erythematous maculopapular rash to lower extremities consistent with rash related to her chemotherapy  Neurological:     Mental Status: She is alert. She is confused.     Assessment/Plan:  Principal Problem:   Fever Active Problems:   AKI (acute kidney injury) ([place])   Hypercalcemia   Hypomagnesemia, chronic   Anija Brickner is a 73 y.o. woman with PMHx significant for ovarian cancer status post hysterectomy and splenectomy, on chemotherapy, presented to ED with fever and chills, maximum temperature 103F, and nontraumatic fall at home prior to arrival. Associated symptoms include decreased appetite, decreased oral intake, fatigue and weakness for two days.    #Fever Patient is intermittently febrile, but her fever curve is downtrending. Her infectious work up includes +nitrites and +leukocytes on UA, but is otherwise negative. Patient started empiric antibiotic therapy with vancomycin and cefepime yesterday. MRSA screening resulted negatively this morning; vancomycin discontinued. Patient's daughter Daleen Snook reports malodorous urine when discarding patient's Purewick pads  last night. Current plan to continue treatment with cefepime to cover cystitis.   This morning patient does exhibit altered mentation; her daughter reports new confusion and memory loss coinciding with fever onset. Symptoms are not consistent with delirium as they do not wax and wane. Plan to reassess for resolution once infectious source has been identified/treated.   Patient has elevated pressures likely measured during her chills and rigors. Will request manual measurements for accuracy. Daleen Snook reports that the patient's baseline systolic pressure  is 308 at home.   #Weakness Patient is on fall precautions and has not ambulated since her arrival, but does endorse continued weakness. Plan to follow up orthostatic vitals. Nutrition has been consulted, noting patient's increased caloric needs. Patient is on fluids, order for Ensure between meals, this morning she reports increased appetite, and is eating and drinking well. PT and OT have been consulted, anticipated evaluations today.    #Neck Pain Patient reports continued left neck pain, rated 8/10 in severity this morning. MR cervical neck does reveal stenosis at C5-C6. Symptoms are more consistent with musculoskeletal pain than neuropathic. Patient is receiving her home dose of gabapentin; plan for muscle relaxer and heating pad today.     Plan -discontinued vancomycin, continue cefepime  -follow up urine and blood cultures -Tylenol for fever -manual blood pressures -orthostatic vitals  -monitor nutrition/intake  -PT and OT evaluations -Flexeril, heating pad for neck pain -IV fluids and repletion of electrolytes as needed      LOS: 1 day   Mercy Moore, Medical Student 07/04/2019, 12:09 PM

## 2019-07-04 NOTE — Plan of Care (Signed)

## 2019-07-04 NOTE — Progress Notes (Signed)
Patient not available-left forms and patient is to call when ready to sign. Conard Novak, Chaplain   07/04/19 1300  Clinical Encounter Type  Visited With Health care provider  Visit Type Initial (Advanced Directive forms-patient to call when ready to sign)  Referral From Patient  Consult/Referral To [place]

## 2019-07-05 ENCOUNTER — Inpatient Hospital Stay (HOSPITAL_COMMUNITY): Payer: Medicare Other

## 2019-07-05 DIAGNOSIS — R4182 Altered mental status, unspecified: Secondary | ICD-10-CM

## 2019-07-05 DIAGNOSIS — C569 Malignant neoplasm of unspecified ovary: Secondary | ICD-10-CM

## 2019-07-05 DIAGNOSIS — G9340 Encephalopathy, unspecified: Secondary | ICD-10-CM

## 2019-07-05 DIAGNOSIS — D539 Nutritional anemia, unspecified: Secondary | ICD-10-CM

## 2019-07-05 DIAGNOSIS — R509 Fever, unspecified: Secondary | ICD-10-CM

## 2019-07-05 DIAGNOSIS — Z888 Allergy status to other drugs, medicaments and biological substances status: Secondary | ICD-10-CM

## 2019-07-05 DIAGNOSIS — Z9221 Personal history of antineoplastic chemotherapy: Secondary | ICD-10-CM

## 2019-07-05 LAB — BASIC METABOLIC PANEL
Anion gap: 9 (ref 5–15)
BUN: 8 mg/dL (ref 8–23)
CO2: 24 mmol/L (ref 22–32)
Calcium: 9 mg/dL (ref 8.9–10.3)
Chloride: 104 mmol/L (ref 98–111)
Creatinine, Ser: 1.11 mg/dL — ABNORMAL HIGH (ref 0.44–1.00)
GFR calc Af Amer: 56 mL/min — ABNORMAL LOW (ref >60)
GFR calc non Af Amer: 49 mL/min — ABNORMAL LOW (ref >60)
Glucose, Bld: 100 mg/dL — ABNORMAL HIGH (ref 70–99)
Potassium: 3.6 mmol/L (ref 3.5–5.1)
Sodium: 137 mmol/L (ref 135–145)

## 2019-07-05 LAB — CBC
HCT: 24.7 % — ABNORMAL LOW (ref 36.0–46.0)
Hemoglobin: 8.1 g/dL — ABNORMAL LOW (ref 12.0–15.0)
MCH: 33.2 pg (ref 26.0–34.0)
MCHC: 32.8 g/dL (ref 30.0–36.0)
MCV: 101.2 fL — ABNORMAL HIGH (ref 80.0–100.0)
Platelets: 324 10*3/uL (ref 150–400)
RBC: 2.44 MIL/uL — ABNORMAL LOW (ref 3.87–5.11)
RDW: 19.5 % — ABNORMAL HIGH (ref 11.5–15.5)
WBC: 10.2 10*3/uL (ref 4.0–10.5)
nRBC: 19.5 % — ABNORMAL HIGH (ref 0.0–0.2)

## 2019-07-05 LAB — MAGNESIUM: Magnesium: 1.3 mg/dL — ABNORMAL LOW (ref 1.7–2.4)

## 2019-07-05 MED ORDER — PIPERACILLIN-TAZOBACTAM 3.375 G IVPB
3.3750 g | Freq: Three times a day (TID) | INTRAVENOUS | Status: DC
  Administered 2019-07-05 – 2019-07-06 (×4): 3.375 g via INTRAVENOUS
  Filled 2019-07-05 (×4): qty 50

## 2019-07-05 MED ORDER — MAGNESIUM SULFATE 4 GM/100ML IV SOLN
4.0000 g | Freq: Once | INTRAVENOUS | Status: AC
  Administered 2019-07-05: 4 g via INTRAVENOUS
  Filled 2019-07-05: qty 100

## 2019-07-05 MED ORDER — LABETALOL HCL 5 MG/ML IV SOLN
5.0000 mg | INTRAVENOUS | Status: DC | PRN
  Administered 2019-07-06: 5 mg via INTRAVENOUS
  Filled 2019-07-05: qty 4

## 2019-07-05 NOTE — Progress Notes (Signed)
Pharmacy Antibiotic Note  Rhonda Hale is a 73 y.o. female admitted on 07/03/2019 with fever of unknown origin.  Pharmacy has been consulted for Zosyn dosing.  Plan: Start Zosyn 3.375 gm IV q8h Monitor renal function, clinical improvement  Height: 5\' 2"  (157.5 cm) Weight: 173 lb (78.5 kg) IBW/kg (Calculated) : 50.1  Temp (24hrs), Avg:100 F (37.8 C), Min:97.9 F (36.6 C), Max:102.2 F (39 C)  Recent Labs  Lab 07/03/19 0938 07/03/19 1755 07/04/19 0350 07/05/19 0402  WBC 5.4  --  7.6 10.2  CREATININE 1.23* 1.25* 1.32* 1.11*  LATICACIDVEN 1.7  --   --   --     Estimated Creatinine Clearance: 42.5 mL/min (A) (by C-G formula based on SCr of 1.11 mg/dL (H)).    Allergies  Allergen Reactions  . Atorvastatin Other (See Comments)    Severe heartburn, reflux    Antimicrobials this admission: Vancomycin 07/22 >> 07/23 Cefepime 07/22 >> 07/24 Zosyn 07//24>>  Microbiology results: BCx 07/22: NGTD  UCx 07/22: NGTD MRSA PCR 07/22: neg  Thank you for allowing pharmacy to be a part of this patient's care.  Acey Lav, PharmD  PGY1 Pharmacy Resident [facility] [phone] 07/05/2019 12:11 PM

## 2019-07-05 NOTE — Progress Notes (Signed)
PT Cancellation Note  Patient Details Name: Rhonda Hale MRN: [id number] DOB: 05-Jan-1946   Cancelled Treatment:    Reason Eval/Treat Not Completed: Patient declined, no reason specified;Medical issues which prohibited therapy Pt awake in room with daughter present.  Daughter politely declines PT today stating that the pt is "more confused" today and "is going for more tests".  PT will attempt treatment again as appropriate.  Rambo Sarafian 07/05/2019, 11:39 AM

## 2019-07-05 NOTE — Plan of Care (Signed)
  Problem: Nutrition: Goal: Adequate nutrition will be maintained Outcome: Not Progressing  Poor appetite. PO encouraged. Fluids encouraged.   Problem: Elimination: Goal: Will not experience complications related to bowel motility Outcome: Not Progressing Incontinent of loose stools. Peri care provided. I&Os recorded.    Problem: Skin Integrity: Goal: Risk for impaired skin integrity will decrease Outcome: Not Progressing  Buttocks slightly red from multiple BMs. Barrier cream applied.

## 2019-07-05 NOTE — TOC Initial Note (Signed)
Transition of Care [facility] - Initial/Assessment Note    Patient Details  Name: Rhonda Hale MRN: [id number] Date of Birth: November 04, 1946  [facility] CM/SW Contact:    Benard Halsted, LCSW Phone Number: 07/05/2019, 2:57 PM  Clinical Narrative:                 CSW received consult for possible home health services at time of discharge. CSW spoke with patient's daughter at the bedside regarding PT recommendation of Home Health PT at time of discharge. Patient is confused and saying incomprehensible words. Patient's daughter reported that she would like home health services. She is aware of difficulties in finding a company to accept [facility]. CSW sent referral for review at [facility]. CSW provided Medicare SNF ratings list. CSW discussed equipment needs with patient's daughter and she requested a 3in1 bedside commode (she has a rolling walker). CSW confirmed PCP (Dr. Hinda Kehr at [facility] in [place]) and address (staying at daughter's: [address] [place], [place] [address]). No further questions reported at this time. CSW to continue to follow and assist with discharge planning needs.   Expected Discharge Plan: [place] Barriers to Discharge: Continued Medical Work up   Patient Goals and CMS Choice Patient states their goals for this hospitalization and ongoing recovery are:: Return home CMS Medicare.gov Compare Post Acute Care list provided to:: Patient Choice offered to / list presented to : Patient  Expected Discharge Plan and Services Expected Discharge Plan: [place] In-house Referral: NA Discharge Planning Services: CM Consult Post Acute Care Choice: Durable Medical Equipment, Home Health Living arrangements for the past 2 months: Single Family Home Expected Discharge Date: 07/05/19                           [facility] Agency: [facility] ([facility]) Date [place]: 07/05/19 Time [place]: [place] Representative spoke with at [place]: [place]  Prior Living Arrangements/Services Living arrangements for the past 2 months: [place] Lives with:: Adult Children Patient language and need for interpreter reviewed:: Yes Do you feel safe going back to the place where you live?: Yes      Need for Family Participation in Patient Care: Yes (Comment) Care giver support system in place?: Yes (comment)   Criminal Activity/Legal Involvement Pertinent to Current Situation/Hospitalization: No - Comment as needed  Activities of Daily Living Home Assistive Devices/Equipment: Grab bars in shower, Shower chair without back, Walker (specify type) ADL Screening (condition at time of admission) Patient's cognitive ability adequate to safely complete daily activities?: Yes Is the patient deaf or have difficulty hearing?: No Does the patient have difficulty seeing, even when wearing glasses/contacts?: No Does the patient have difficulty concentrating, remembering, or making decisions?: No Patient able to express need for assistance with ADLs?: No Does the patient have difficulty dressing or bathing?: No Independently performs ADLs?: Yes (appropriate for developmental age) Does the patient have difficulty walking or climbing stairs?: No Weakness of Legs: Both Weakness of Arms/Hands: None  Permission Sought/Granted Permission sought to share information with : Facility Sport and exercise psychologist, Family Supports Permission granted to share information with : Yes, Verbal Permission Granted  Share Information with NAME: Daleen Snook  Permission granted to share info w AGENCY: [place] granted to share info w Relationship: Daughter  Permission granted to share info w Contact Information:  [phone]  Emotional Assessment Appearance:: Appears stated age Attitude/Demeanor/Rapport: Unable to Assess Affect (typically observed): Unable to  Assess Orientation: : Fluctuating Orientation (Suspected and/or reported Sundowners) Alcohol / Substance Use: Not Applicable Psych Involvement: No (comment)  Admission diagnosis:  Febrile illness, acute [R50.9] Fever [R50.9] Patient Active Problem List   Diagnosis Date Noted  . Fever 07/03/2019  . AKI (acute kidney injury) ([place]) 07/03/2019  . Hypercalcemia 07/03/2019  . Hypomagnesemia, chronic 07/03/2019   PCP:  System, Pcp Not In Pharmacy:   [place], [place] - [address] [facility]. [address] [place] [address] Phone: [phone] Fax: [phone]     Social Determinants of Health (SDOH) Interventions    Readmission Risk Interventions Readmission Risk Prevention Plan 07/05/2019  Post Dischage Appt Complete  Medication Screening Complete  Transportation Screening Complete  Some recent data might be hidden

## 2019-07-05 NOTE — Consult Note (Addendum)
[place] for Infectious Disease       Reason for Consult: encephalopathy and fever    Referring Physician: Dr. Lynnae January  Principal Problem:   Fever Active Problems:   AKI (acute kidney injury) ([place])   Hypercalcemia   Hypomagnesemia, chronic   . feeding supplement (ENSURE ENLIVE)  237 mL Oral BID BM  . levothyroxine  137 mcg Oral QAC breakfast  . multivitamin with minerals  1 tablet Oral Daily  . rivaroxaban  20 mg Oral Daily  . sodium chloride flush  3 mL Intravenous Q12H    Recommendations: MRI brain LP if MRI unrevealing for bacterial, viral studies (HSV, VZV, enterovirus)  Assessment: She has encephalopathy worse than her baseline, some neck stiffness, fever concerning for encephalitis or meningitis.  No other new symptoms.  Certainly in someone on active chemotherapy the differential is broad including the above.  Her CT abdomen is negative for occult infection.  It does note some bilateral hazy airspace disease but she is breathing comfortably.  Differential of that if her respiratory status worsens would be viral, PJP but she has no hypoxia or concerns.   No warmth at her hip arthroplasty.  Her calcium has been elevated and is 11.4 but is close to her baseline (11.3 earlier this month)  Antibiotics: Pip/tazo day 1 Day 4 of antibiotics  HPI: Rhonda Hale is a 73 y.o. female with a history of ovarian cancer that was found to have relapsed in August 2019 with a PET scan noting scatter peritoneal, mesenteric and possibly omental tumor nodules.  She was started on chemotherapy and is currently on treatment with her last chemo July 15.  She was admitted 7/22 with fever and poor po intake.  She was started on empiric antibiotics and this am noted to be confused and shaking.  She has had daily fevers, WBC wnl.  The daughter is at the bedside and provides most of the history.  Other parts via [place].  She has had no other concerning symptoms.    Review  of Systems:  Constitutional: positive for fevers, chills, fatigue and weight loss or negative for sweats Ears, nose, mouth, throat, and face: negative for sore mouth Respiratory: negative for cough, sputum or hemoptysis Gastrointestinal: negative for nausea and vomiting Genitourinary: negative for dysuria Integument/breast: negative for rash Musculoskeletal: negative for myalgias and arthralgias All other systems reviewed and are negative    Past Medical History:  Diagnosis Date  . Cancer [facility])     Social History   Tobacco Use  . Smoking status: Never Smoker  . Smokeless tobacco: Never Used  Substance Use Topics  . Alcohol use: Not Currently  . Drug use: Never    History reviewed. No pertinent family history.  Allergies  Allergen Reactions  . Atorvastatin Other (See Comments)    Severe heartburn, reflux    Physical Exam: Constitutional: non-toxic, fatigued appearing Vitals:   07/05/19 0547 07/05/19 0658  BP:  (!) 143/80  Pulse:  (!) 101  Resp:  16  Temp: (!) 102.2 F (39 C) 98.5 F (36.9 C)  SpO2:  93%   EYES: anicteric ENMT: no thrush, no oral lesions, dry mm Cardiovascular: Cor RRR Respiratory: CTA B, anterior exam; normal respiratory effort GI: Bowel sounds are normal, liver is not enlarged, spleen is not enlarged Musculoskeletal: no pedal edema noted Skin: some faint diffuse, non-confluent erythematous areas, chronic per the daughter Hematologic: no cervical or supraclavicular lad  Lab Results  Component Value Date  WBC 10.2 07/05/2019   HGB 8.1 (L) 07/05/2019   HCT 24.7 (L) 07/05/2019   MCV 101.2 (H) 07/05/2019   PLT 324 07/05/2019    Lab Results  Component Value Date   CREATININE 1.11 (H) 07/05/2019   BUN 8 07/05/2019   NA 137 07/05/2019   K 3.6 07/05/2019   CL 104 07/05/2019   CO2 24 07/05/2019    Lab Results  Component Value Date   ALT 16 07/04/2019   AST 21 07/04/2019   ALKPHOS 66 07/04/2019     Microbiology: Recent Results  (from the past 240 hour(s))  Blood Culture (routine x 2)     Status: None (Preliminary result)   Collection Time: 07/03/19 10:00 AM   Specimen: BLOOD RIGHT HAND  Result Value Ref Range Status   Specimen Description BLOOD RIGHT HAND  Final   Special Requests   Final    BOTTLES DRAWN AEROBIC AND ANAEROBIC Blood Culture adequate volume   Culture   Final    NO GROWTH 2 DAYS Performed at [facility], [address], [place], [place] [address]    Report Status PENDING  Incomplete  Blood Culture (routine x 2)     Status: None (Preliminary result)   Collection Time: 07/03/19 10:07 AM   Specimen: BLOOD RIGHT WRIST  Result Value Ref Range Status   Specimen Description BLOOD RIGHT WRIST  Final   Special Requests   Final    BOTTLES DRAWN AEROBIC AND ANAEROBIC Blood Culture results may not be optimal due to an inadequate volume of blood received in culture bottles   Culture   Final    NO GROWTH 2 DAYS Performed at [facility], [place] [address], [place], [place] [address]    Report Status PENDING  Incomplete  SARS Coronavirus 2 (CEPHEID- Performed in [place] hospital lab), Hosp Order     Status: None   Collection Time: 07/03/19 10:13 AM   Specimen: Nasopharyngeal Swab  Result Value Ref Range Status   SARS Coronavirus 2 NEGATIVE NEGATIVE Final    Comment: (NOTE) If result is NEGATIVE SARS-CoV-2 target nucleic acids are NOT DETECTED. The SARS-CoV-2 RNA is generally detectable in upper and lower  respiratory specimens during the acute phase of infection. The lowest  concentration of SARS-CoV-2 viral copies this assay can detect is 250  copies / mL. A negative result does not preclude SARS-CoV-2 infection  and should not be used as the sole basis for treatment or other  patient management decisions.  A negative result may occur with  improper specimen collection / handling, submission of specimen other  than nasopharyngeal swab, presence of viral mutation(s) within the  areas  targeted by this assay, and inadequate number of viral copies  (<250 copies / mL). A negative result must be combined with clinical  observations, patient history, and epidemiological information. If result is POSITIVE SARS-CoV-2 target nucleic acids are DETECTED. The SARS-CoV-2 RNA is generally detectable in upper and lower  respiratory specimens dur ing the acute phase of infection.  Positive  results are indicative of active infection with SARS-CoV-2.  Clinical  correlation with patient history and other diagnostic information is  necessary to determine patient infection status.  Positive results do  not rule out bacterial infection or co-infection with other viruses. If result is PRESUMPTIVE POSTIVE SARS-CoV-2 nucleic acids MAY BE PRESENT.   A presumptive positive result was obtained on the submitted specimen  and confirmed on repeat testing.  While 2019 novel coronavirus  (  SARS-CoV-2) nucleic acids may be present in the submitted sample  additional confirmatory testing may be necessary for epidemiological  and / or clinical management purposes  to differentiate between  SARS-CoV-2 and other Sarbecovirus currently known to infect humans.  If clinically indicated additional testing with an alternate test  methodology [id number]) is advised. The SARS-CoV-2 RNA is generally  detectable in upper and lower respiratory sp ecimens during the acute  phase of infection. The expected result is Negative. Fact Sheet for Patients:  StrictlyIdeas.no Fact Sheet for Healthcare Providers: BankingDealers.co.za This test is not yet approved or cleared by the [place] FDA and has been authorized for detection and/or diagnosis of SARS-CoV-2 by FDA under an Emergency Use Authorization (EUA).  This EUA will remain in effect (meaning this test can be used) for the duration of the COVID-19 declaration under Section 564(b)(1) of the Act, 21 U.S.C. section  360bbb-3(b)(1), unless the authorization is terminated or revoked sooner. Performed at [facility], [place] [address], [place], [place] [address]   Urine culture     Status: None   Collection Time: 07/03/19  4:11 PM   Specimen: In/Out Cath Urine  Result Value Ref Range Status   Specimen Description IN/OUT CATH URINE  Final   Special Requests NONE  Final   Culture   Final    NO GROWTH Performed at [facility], [place] [address], [place], [place] [address]    Report Status 07/04/2019 FINAL  Final  MRSA PCR Screening     Status: None   Collection Time: 07/04/19  7:48 AM   Specimen: Nasal Mucosa; Nasopharyngeal  Result Value Ref Range Status   MRSA by PCR NEGATIVE NEGATIVE Final    Comment:        The GeneXpert MRSA Assay (FDA approved for NASAL specimens only), is one component of a comprehensive MRSA colonization surveillance program. It is not intended to diagnose MRSA infection nor to guide or monitor treatment for MRSA infections. Performed at [facility], [place] [address], [place], [place] [address]   Culture, blood (routine x 2)     Status: None (Preliminary result)   Collection Time: 07/05/19  7:12 AM   Specimen: BLOOD  Result Value Ref Range Status   Specimen Description BLOOD RIGHT ANTECUBITAL  Final   Special Requests   Final    BOTTLES DRAWN AEROBIC AND ANAEROBIC Blood Culture adequate volume   Culture   Final    NO GROWTH < 12 HOURS Performed at [facility], [place] [address], [place], [place] [address]    Report Status PENDING  Incomplete  Culture, blood (routine x 2)     Status: None (Preliminary result)   Collection Time: 07/05/19  7:17 AM   Specimen: BLOOD RIGHT HAND  Result Value Ref Range Status   Specimen Description BLOOD RIGHT HAND  Final   Special Requests   Final    BOTTLES DRAWN AEROBIC ONLY Blood Culture adequate volume   Culture   Final    NO GROWTH < 12 HOURS Performed at [facility], [place] [address]  [place], [place] [address]    Report Status PENDING  Incomplete    Thayer Headings, [place] for Infectious Disease [place] [email or web address] 07/05/2019, 3:40 PM

## 2019-07-05 NOTE — Progress Notes (Signed)
OT Cancellation Note  Patient Details Name: Rhonda Hale MRN: [id number] DOB: Nov 14, 1946   Cancelled Treatment:    Reason Eval/Treat Not Completed: Patient declined, no reason specified. Pt's daughter present in room and reports pt is "more confused" than yesterday, and she feels like she would have a hard time participating in therapy. Pt awake in bed but was slower to process therapist questions and did not answer questions directly but does state she would like to rest. Will hold therapy today but will continue to follow acutely.  Darrol Jump OTR/L 07/05/2019, 1:53 PM

## 2019-07-05 NOTE — Progress Notes (Signed)
Subjective: Rhonda Hale is severely altered this morning and is unable to provide history. Rhonda Hale, her daughter, is present in the room and reports that patient was altered when she got her room this morning and is unsure when she made this turn. Patient is tremulous, which is different from fever and chills she has been experiencing, vocalizes but makes no intelligence speech, and arouses to voice.    Rhonda Hale reports that the patient did not eat dinner last night and hasn't touched her breakfast. Yesterday morning patient had an episode of loose stool which is normal. Rhonda Hale denies observation of new cough, vomiting or diarrhea. However she does that her urine is dark; it was clear yesterday.   Objective:  Vital signs in last 24 hours: Vitals:   07/04/19 2145 07/04/19 2150 07/05/19 0547 07/05/19 0658  BP:  (!) 178/80  (!) 143/80  Pulse:  88  (!) 101  Resp:  16  16  Temp: 98.5 F (36.9 C)  (!) 102.2 F (39 C) 98.5 F (36.9 C)  TempSrc: Oral  Axillary   SpO2:  91%  93%  Weight:      Height:       Weight change:   Intake/Output Summary (Last 24 hours) at 07/05/2019 1048 Last data filed at 07/05/2019 0551 Gross per 24 hour  Intake 1414.55 ml  Output 1100 ml  Net 314.55 ml   Physical Exam Vitals signs reviewed.  Constitutional:      General: She is awake.     Appearance: She is obese.  HENT:     Head: Normocephalic and atraumatic.  Cardiovascular:     Rate and Rhythm: Normal rate and regular rhythm.     Pulses:          Dorsalis pedis pulses are 1+ on the right side and 1+ on the left side.     Heart sounds: Normal heart sounds.  Pulmonary:     Effort: Pulmonary effort is normal. No respiratory distress.     Breath sounds: Normal breath sounds. No wheezing.  Abdominal:     General: Bowel sounds are normal.     Palpations: Abdomen is soft.  Neurological:     GCS: GCS eye subscore is 4. GCS verbal subscore is 2. GCS motor subscore is 5.     Comments: awake, tremulous,  groans and vocalizes, but produces no coherent speech       Assessment/Plan:  Principal Problem:   Fever Active Problems:   AKI (acute kidney injury) ([place])   Hypercalcemia   Hypomagnesemia, chronic  Rhonda Hale is a 73 y.o. woman with PMHx significant for ovarian cancer status post hysterectomy and splenectomy, on chemotherapy, presented to ED two days ago with fever and chills, maximum temperature 103F, and nontraumatic fall at home prior to arrival. Associated symptoms include decreased appetite, decreased oral intake, fatigue and weakness for two days prior to her arrival.  #Fever #Alertered Mentation Patient remains intermittently febrile, spiking a fever of 102.35F early this morning. Given negative MRSA screening, vancomycin was discontinued yesterday. Patient is currently on day 3 of antibiotics, IV cefepime 2g  q12. Patient's WBCs are uptrending but remain within normal limits, no neutropenia [7/22 ANC 3.8]. Infectious work up remains inconclusive. Repeat CXR yesterday was negative, urine culture negative, blood cultures show no growth at < 24 hours, but are being redrawn due to inadequate sample + culture from her port site. Given patient's signficant decline in mental status, plan for MRI brain and CT abd/pelvis. Patient's oncologist,  Dr. Sherryle Lis, is out of the office for the week, but we have left a message with the on call oncologist, Dr. Baxter Flattery. Awaiting recommendations.   Next step for consideration is evaluation of CSF for infection.   #AKI Patient's creatine is downtrending towards her baseline. She remains on IV fluids.   #Neck pain Unable to assess today, given patient's mental status. Discontinue Flexeril.   #Anemia, Macrocytosis Iron studies are consistent with anemia of chronic disease. Onset of macrocytic anemia in 12/2018. Vitamin B12 is normal. Hemoglobin is stable at 8.1. No intervention warranted at this time.   #Hypercalcemia Calcium levels have improved  with fluids. Corrected value could not be calculated today.   #Hypmagnesemia Repletion as needed.     LOS: 2 days   Mercy Moore, Medical Student 07/05/2019, 11:56 AM

## 2019-07-05 NOTE — Progress Notes (Addendum)
1115: First page to MD. Patient has AMS. Words incomprehensible.   1204: Second page. To MD. Informed of above. Orders already in place.  1519: Page to MD. Patient daughter asking about medications. MD to speak with daughter.   1546: Page to MD. BP elevated. Febrile.   1600: Second page to MD. Will place orders. No need for cooling blanket at this time per MD.  1620: Patient refusing PO meds. Suppisority acetaminophen given. Daughter remains at bedside. Updated on condition.  1640: Call to MD. Patient has blood on pads during incontinence change. More confused.   1719: Second page to MD. Informed of the above.  1800: Ok for cooling blanket for temp.

## 2019-07-05 NOTE — Care Management Important Message (Signed)
Important Message  Patient Details  Name: Rhonda Hale MRN: [id number] Date of Birth: 07/24/1946   Medicare Important Message Given:  Yes     Orbie Pyo 07/05/2019, 3:14 PM

## 2019-07-06 ENCOUNTER — Inpatient Hospital Stay (HOSPITAL_COMMUNITY): Payer: Medicare Other

## 2019-07-06 DIAGNOSIS — I10 Essential (primary) hypertension: Secondary | ICD-10-CM

## 2019-07-06 LAB — COMPREHENSIVE METABOLIC PANEL
ALT: 18 U/L (ref 0–44)
AST: 29 U/L (ref 15–41)
Albumin: 2.3 g/dL — ABNORMAL LOW (ref 3.5–5.0)
Alkaline Phosphatase: 76 U/L (ref 38–126)
Anion gap: 13 (ref 5–15)
BUN: 10 mg/dL (ref 8–23)
CO2: 24 mmol/L (ref 22–32)
Calcium: 8.2 mg/dL — ABNORMAL LOW (ref 8.9–10.3)
Chloride: 100 mmol/L (ref 98–111)
Creatinine, Ser: 1.26 mg/dL — ABNORMAL HIGH (ref 0.44–1.00)
GFR calc Af Amer: 48 mL/min — ABNORMAL LOW (ref >60)
GFR calc non Af Amer: 42 mL/min — ABNORMAL LOW (ref >60)
Glucose, Bld: 81 mg/dL (ref 70–99)
Potassium: 3.1 mmol/L — ABNORMAL LOW (ref 3.5–5.1)
Sodium: 137 mmol/L (ref 135–145)
Total Bilirubin: 1 mg/dL (ref 0.3–1.2)
Total Protein: 5.5 g/dL — ABNORMAL LOW (ref 6.5–8.1)

## 2019-07-06 LAB — MAGNESIUM: Magnesium: 1.6 mg/dL — ABNORMAL LOW (ref 1.7–2.4)

## 2019-07-06 MED ORDER — AMLODIPINE BESYLATE 5 MG PO TABS
5.0000 mg | ORAL_TABLET | Freq: Every day | ORAL | Status: DC
  Administered 2019-07-06: 5 mg via ORAL
  Filled 2019-07-06: qty 1

## 2019-07-06 MED ORDER — SODIUM CHLORIDE 0.9 % IV SOLN
INTRAVENOUS | Status: DC
  Administered 2019-07-07: 05:00:00 via INTRAVENOUS

## 2019-07-06 MED ORDER — LABETALOL HCL 5 MG/ML IV SOLN: 5.0000 mg | INTRAVENOUS | Status: DC | PRN

## 2019-07-06 MED ORDER — POTASSIUM CHLORIDE CRYS ER 20 MEQ PO TBCR
40.0000 meq | EXTENDED_RELEASE_TABLET | Freq: Two times a day (BID) | ORAL | Status: AC
  Administered 2019-07-06 (×2): 40 meq via ORAL
  Filled 2019-07-06 (×2): qty 2

## 2019-07-06 MED ORDER — DEXTROSE 5 % IV SOLN
615.0000 mg | Freq: Two times a day (BID) | INTRAVENOUS | Status: DC
  Administered 2019-07-06 – 2019-07-08 (×6): 615 mg via INTRAVENOUS
  Filled 2019-07-06 (×8): qty 12.3

## 2019-07-06 MED ORDER — HYDRALAZINE HCL 25 MG PO TABS
25.0000 mg | ORAL_TABLET | Freq: Two times a day (BID) | ORAL | Status: DC | PRN
  Filled 2019-07-06: qty 1

## 2019-07-06 MED ORDER — LISINOPRIL 20 MG PO TABS
20.0000 mg | ORAL_TABLET | Freq: Every day | ORAL | Status: DC
  Administered 2019-07-06: 20 mg via ORAL
  Filled 2019-07-06: qty 1

## 2019-07-06 MED ORDER — MAGNESIUM SULFATE 2 GM/50ML IV SOLN
2.0000 g | Freq: Once | INTRAVENOUS | Status: AC
  Administered 2019-07-06: 2 g via INTRAVENOUS
  Filled 2019-07-06: qty 50

## 2019-07-06 NOTE — Progress Notes (Signed)
Contacted MD Winter for clarification concerning blood culture order. Instructed to have 1 peripheral lab draw and 1 central line lab draw for blood culture set. Lab phlebotomist notified.

## 2019-07-06 NOTE — Progress Notes (Signed)
  Pharmacy Antibiotic Note  Rhonda Hale is a 73 y.o. female admitted on 07/03/2019 with potential HSV encephalitis.  Pharmacy has been consulted for Acyclovir dosing. MRI shows diffuse encephalitis. Patient is also on Zosyn.   Plan: Start Acyclovir 615 mg IV q12h Monitor renal function, clinical improvement   Height: 5\' 2"  (157.5 cm) Weight: 173 lb (78.5 kg) IBW/kg (Calculated) : 50.1  AdjBW (Calculated): 61.5  Temp (24hrs), Avg:100.4 F (38 C), Min:99.3 F (37.4 C), Max:102.3 F (39.1 C)  Recent Labs  Lab 07/03/19 0938 07/03/19 1755 07/04/19 0350 07/05/19 0402 07/06/19 0421  WBC 5.4  --  7.6 10.2  --   CREATININE 1.23* 1.25* 1.32* 1.11* 1.26*  LATICACIDVEN 1.7  --   --   --   --     Estimated Creatinine Clearance: 37.5 mL/min (A) (by C-G formula based on SCr of 1.26 mg/dL (H)).    Allergies  Allergen Reactions  . Atorvastatin Other (See Comments)    Severe heartburn, reflux    Antimicrobials this admission: 7/22 Vancomycin >> 7/23 7/22 Cefepime >> 07/24 7/24 Zosyn >> 7/25 Acyclovir >>   Microbiology results: 7/22; 7/24 BCx (x2):  ngtd 7/22 UCx: ngtd 7/23 MRSA PCR: neg  Thank you for allowing pharmacy to be a part of this patient's care.  Acey Lav, PharmD  PGY1 Pharmacy Resident [facility] [phone] 07/06/2019 1:59 PM

## 2019-07-06 NOTE — Progress Notes (Signed)
Internal Medicine Teaching Service Attending:   I saw and examined the patient. I reviewed the resident's note and I agree with the resident's findings and plan as documented in the resident's note.  Principal Problem:   Fever Active Problems:   AKI (acute kidney injury) ([place])   Hypercalcemia   Hypomagnesemia, chronic  Hospital day #3 for this 73 year old woman on chemotherapy for ovarian cancer here with fever and persistent encephalopathy.  Temperature to 102 last night, this is despite 4 days of broad-spectrum antibiotics.  Blood cultures on 7/22 and 7/24 are all negative.  MRI showing diffuse encephalitis consistent with PRES. greatly appreciate ID consultation.  I agree with discontinuing antibiotics at this time.  Also agree with performing lumbar puncture, but we will have to wait as her last dose of Xarelto was this morning at 8 AM.  Will hold tomorrow's dose and should safely be able to do the LP on Monday.  For the likely PRES will continue with blood pressure control, I agree with adding amlodipine and lisinopril and monitor renal function closely.  Hopefully we will see improvement in her mental status as the PRES resolves, though this can take several days.  Lalla Brothers, MD FACP

## 2019-07-06 NOTE — Progress Notes (Signed)
Notified by main lab that pt could not have 3 sets of blood cultures processed in a 24 hr time span. MD Winter paged and made aware. Orders will be modified for collection by MD appropriately.

## 2019-07-06 NOTE — Progress Notes (Signed)
Called MRI department to check on status of pt's STAT MRI order. Notified that pts MRI may not be complete until morning due to other STAT cases ahead of pt. MD notified.

## 2019-07-06 NOTE — Progress Notes (Signed)
[facility]   Reason for visit: Follow up on encephalopathy  Interval History: MRI with diffuse encephalitis-like hyperintense T2-weighted signal in both hemispheres, thalami most concerning for PRES.  Confused this am, daughter remains at bedside.  Fever curve has trended down some.    Physical Exam: Constitutional:  Vitals:   07/06/19 0837 07/06/19 1241  BP: (!) 158/61 (!) 170/67  Pulse: 92 93  Resp:    Temp:    SpO2: 97%    patient appears in some distress with confusion Eyes: anicteric Respiratory: Normal respiratory effort; CTA B Cardiovascular: RRR GI: soft, nt, nd Neuro: moving extremities  Review of Systems: Unable to be assessed due to mental status  Lab Results  Component Value Date   WBC 10.2 07/05/2019   HGB 8.1 (L) 07/05/2019   HCT 24.7 (L) 07/05/2019   MCV 101.2 (H) 07/05/2019   PLT 324 07/05/2019    Lab Results  Component Value Date   CREATININE 1.26 (H) 07/06/2019   BUN 10 07/06/2019   NA 137 07/06/2019   K 3.1 (L) 07/06/2019   CL 100 07/06/2019   CO2 24 07/06/2019    Lab Results  Component Value Date   ALT 18 07/06/2019   AST 29 07/06/2019   ALKPHOS 76 07/06/2019     Microbiology: Recent Results (from the past 240 hour(s))  Blood Culture (routine x 2)     Status: None (Preliminary result)   Collection Time: 07/03/19 10:00 AM   Specimen: BLOOD RIGHT HAND  Result Value Ref Range Status   Specimen Description BLOOD RIGHT HAND  Final   Special Requests   Final    BOTTLES DRAWN AEROBIC AND ANAEROBIC Blood Culture adequate volume   Culture   Final    NO GROWTH 3 DAYS Performed at [facility], [address] [place], [place] [address]    Report Status PENDING  Incomplete  Blood Culture (routine x 2)     Status: None (Preliminary result)   Collection Time: 07/03/19 10:07 AM   Specimen: BLOOD RIGHT WRIST  Result Value Ref Range Status   Specimen Description BLOOD RIGHT WRIST  Final   Special Requests    Final    BOTTLES DRAWN AEROBIC AND ANAEROBIC Blood Culture results may not be optimal due to an inadequate volume of blood received in culture bottles   Culture   Final    NO GROWTH 3 DAYS Performed at [facility], [place] [address], [place], [place] [address]    Report Status PENDING  Incomplete  SARS Coronavirus 2 (CEPHEID- Performed in [place] hospital lab), Hosp Order     Status: None   Collection Time: 07/03/19 10:13 AM   Specimen: Nasopharyngeal Swab  Result Value Ref Range Status   SARS Coronavirus 2 NEGATIVE NEGATIVE Final    Comment: (NOTE) If result is NEGATIVE SARS-CoV-2 target nucleic acids are NOT DETECTED. The SARS-CoV-2 RNA is generally detectable in upper and lower  respiratory specimens during the acute phase of infection. The lowest  concentration of SARS-CoV-2 viral copies this assay can detect is 250  copies / mL. A negative result does not preclude SARS-CoV-2 infection  and should not be used as the sole basis for treatment or other  patient management decisions.  A negative result may occur with  improper specimen collection / handling, submission of specimen other  than nasopharyngeal swab, presence of viral mutation(s) within the  areas targeted by this assay, and inadequate number  of viral copies  (<250 copies / mL). A negative result must be combined with clinical  observations, patient history, and epidemiological information. If result is POSITIVE SARS-CoV-2 target nucleic acids are DETECTED. The SARS-CoV-2 RNA is generally detectable in upper and lower  respiratory specimens dur ing the acute phase of infection.  Positive  results are indicative of active infection with SARS-CoV-2.  Clinical  correlation with patient history and other diagnostic information is  necessary to determine patient infection status.  Positive results do  not rule out bacterial infection or co-infection with other viruses. If result is PRESUMPTIVE POSTIVE SARS-CoV-2  nucleic acids MAY BE PRESENT.   A presumptive positive result was obtained on the submitted specimen  and confirmed on repeat testing.  While 2019 novel coronavirus  (SARS-CoV-2) nucleic acids may be present in the submitted sample  additional confirmatory testing may be necessary for epidemiological  and / or clinical management purposes  to differentiate between  SARS-CoV-2 and other Sarbecovirus currently known to infect humans.  If clinically indicated additional testing with an alternate test  methodology [phone]) is advised. The SARS-CoV-2 RNA is generally  detectable in upper and lower respiratory sp ecimens during the acute  phase of infection. The expected result is Negative. Fact Sheet for Patients:  StrictlyIdeas.no Fact Sheet for Healthcare Providers: BankingDealers.co.za This test is not yet approved or cleared by the [place] FDA and has been authorized for detection and/or diagnosis of SARS-CoV-2 by FDA under an Emergency Use Authorization (EUA).  This EUA will remain in effect (meaning this test can be used) for the duration of the COVID-19 declaration under Section 564(b)(1) of the Act, 21 U.S.C. section 360bbb-3(b)(1), unless the authorization is terminated or revoked sooner. Performed at [facility], [place] [address], [place], [place] [address]   Urine culture     Status: None   Collection Time: 07/03/19  4:11 PM   Specimen: In/Out Cath Urine  Result Value Ref Range Status   Specimen Description IN/OUT CATH URINE  Final   Special Requests NONE  Final   Culture   Final    NO GROWTH Performed at [facility], [place] [address], [place], [place] [address]    Report Status 07/04/2019 FINAL  Final  MRSA PCR Screening     Status: None   Collection Time: 07/04/19  7:48 AM   Specimen: Nasal Mucosa; Nasopharyngeal  Result Value Ref Range Status   MRSA by PCR NEGATIVE NEGATIVE Final    Comment:        The  GeneXpert MRSA Assay (FDA approved for NASAL specimens only), is one component of a comprehensive MRSA colonization surveillance program. It is not intended to diagnose MRSA infection nor to guide or monitor treatment for MRSA infections. Performed at [facility], [place] [address] [place], [place] [address]   Culture, blood (routine x 2)     Status: None (Preliminary result)   Collection Time: 07/05/19  7:12 AM   Specimen: BLOOD  Result Value Ref Range Status   Specimen Description BLOOD RIGHT ANTECUBITAL  Final   Special Requests   Final    BOTTLES DRAWN AEROBIC AND ANAEROBIC Blood Culture adequate volume   Culture   Final    NO GROWTH < 24 HOURS Performed at [facility], [place] [address] [place], [place] [address]    Report Status PENDING  Incomplete  Culture, blood (routine x 2)     Status: None (Preliminary result)  Collection Time: 07/05/19  7:17 AM   Specimen: BLOOD RIGHT HAND  Result Value Ref Range Status   Specimen Description BLOOD RIGHT HAND  Final   Special Requests   Final    BOTTLES DRAWN AEROBIC ONLY Blood Culture adequate volume   Culture   Final    NO GROWTH < 24 HOURS Performed at [facility], [address] [place], [place] [address]    Report Status PENDING  Incomplete    Impression/Plan:  1. Encephalopathy - MRI findings noted and most concerning for PRES.  I do think LP is still indicated to rule out other viral encephalitis including HSV, VZV, CMV.   Though I much less suspect viral encephalitis with the MRI findings, I recommend starting IV acyclovir for HSV, VZV until that has been ruled out. Ultimately, the treatment will otherwise be avoid immunosuppression.   2. Fever - some improvement and from #1.  With findings above, no indication for pip/tazo so can stop.

## 2019-07-06 NOTE — Progress Notes (Signed)
  Subjective:  Daughter at bedside reports continued confusion similar to yesterday.  Daughter reports that earlier patient was able to provide her name and date of birth.  No acute complaints. Patient denies pain but is not able to answer further questions.   Objective:    Vital Signs (last 24 hours): Vitals:   07/06/19 0128 07/06/19 0619 07/06/19 0819 07/06/19 0837  BP: (!) 152/72 (!) 174/96  (!) 158/61  Pulse: 83 86  92  Resp:  18    Temp: 99.5 F (37.5 C) 99.3 F (37.4 C) 100.2 F (37.9 C)   TempSrc: Axillary  Axillary   SpO2:  93%  97%  Weight:      Height:        Physical Exam: General Awake but not oriented, no acute distress  Cardiac Regular rate and rhythm, no murmurs, rubs, or gallops  Pulmonary Clear to auscultation bilaterally without wheezes, rhonchi, or rales  Abdominal Soft, non-tender, without distention  Extremities No peripheral edema     Assessment/Plan:   Principal Problem:   Fever Active Problems:   AKI (acute kidney injury) (HCC)   Hypercalcemia   Hypomagnesemia, chronic  Rhonda Hale is a 73 year old female with past medical history significant for ovarian cancer status post hysterectomy and splenectomy, on chemotherapy, presented to the ED following 2-day history of fever, chills, and elevated temperature to 103 Fahrenheit.  # Fever with AMS: Patient experiencing repeat fever spikes, with elevation to 102.3 yesterday at 1735.  Blood cultures from 7/22 show no growth at 3 days and repeat culture from central line was drawn overnight.  UA within normal limits and urine culture shows no growth.  CT scan and MRI brain do not reveal source for infection. Concern for PRESS on MR which may explain or be contributing to altered mental status. * Will hold Xarelto (20mg ) with plans for LP on Monday for further infections workup. * ID following, appreciate their continued recommendations * Will initiate stricter control of blood pressure, see below  #  Hypertension: Patient with elevated blood pressure with systolics 202-542.  Patient without history of hypertension, may be secondary to bevacizumab.  Given concern for press on MR will initiate therapy for stricter control of blood pressure. * Amlodipine 5mg  QD, Lisinopril 20mg  QD * Hydralazine 25 mg Q12HR PRN for SBP > 160  # AKI: Creatinine of 1.26, near admission level of 1.23.  Will continue on IV maintenance fluids. Will monitor with initiation of lisinopril  # Hypercalcemia: Elevated to 11.4 on 7/22, likely secondary to overrepletion. Improved with fluids.  # Anemia: Iron studies consistent with anemia of chronic disease. Hemoglobin stable at 8.1   Dispo: Anticipated discharge pending clinical improvement.   Jeanmarie Hubert, MD 07/06/2019, 11:33 AM Pager: [phone]

## 2019-07-07 DIAGNOSIS — I6783 Posterior reversible encephalopathy syndrome: Secondary | ICD-10-CM

## 2019-07-07 LAB — COMPREHENSIVE METABOLIC PANEL
ALT: 20 U/L (ref 0–44)
AST: 27 U/L (ref 15–41)
Albumin: 2.3 g/dL — ABNORMAL LOW (ref 3.5–5.0)
Alkaline Phosphatase: 82 U/L (ref 38–126)
Anion gap: 12 (ref 5–15)
BUN: 8 mg/dL (ref 8–23)
CO2: 23 mmol/L (ref 22–32)
Calcium: 7.7 mg/dL — ABNORMAL LOW (ref 8.9–10.3)
Chloride: 101 mmol/L (ref 98–111)
Creatinine, Ser: 1.05 mg/dL — ABNORMAL HIGH (ref 0.44–1.00)
GFR calc Af Amer: >60 mL/min (ref >60)
GFR calc non Af Amer: 52 mL/min — ABNORMAL LOW (ref >60)
Glucose, Bld: 108 mg/dL — ABNORMAL HIGH (ref 70–99)
Potassium: 3 mmol/L — ABNORMAL LOW (ref 3.5–5.1)
Sodium: 136 mmol/L (ref 135–145)
Total Bilirubin: 0.6 mg/dL (ref 0.3–1.2)
Total Protein: 5.7 g/dL — ABNORMAL LOW (ref 6.5–8.1)

## 2019-07-07 LAB — CBC
HCT: 24.3 % — ABNORMAL LOW (ref 36.0–46.0)
Hemoglobin: 8.2 g/dL — ABNORMAL LOW (ref 12.0–15.0)
MCH: 33.7 pg (ref 26.0–34.0)
MCHC: 33.7 g/dL (ref 30.0–36.0)
MCV: 100 fL (ref 80.0–100.0)
Platelets: 371 10*3/uL (ref 150–400)
RBC: 2.43 MIL/uL — ABNORMAL LOW (ref 3.87–5.11)
RDW: 19.6 % — ABNORMAL HIGH (ref 11.5–15.5)
WBC: 9.8 10*3/uL (ref 4.0–10.5)
nRBC: 11.4 % — ABNORMAL HIGH (ref 0.0–0.2)

## 2019-07-07 LAB — MAGNESIUM: Magnesium: 1.7 mg/dL (ref 1.7–2.4)

## 2019-07-07 MED ORDER — HYDRALAZINE HCL 20 MG/ML IJ SOLN
5.0000 mg | Freq: Once | INTRAMUSCULAR | Status: AC
  Administered 2019-07-07: 5 mg via INTRAVENOUS
  Filled 2019-07-07: qty 1

## 2019-07-07 MED ORDER — AMLODIPINE BESYLATE 10 MG PO TABS
10.0000 mg | ORAL_TABLET | Freq: Every day | ORAL | Status: DC
  Administered 2019-07-07 – 2019-07-17 (×11): 10 mg via ORAL
  Filled 2019-07-07 (×11): qty 1

## 2019-07-07 MED ORDER — ACETAMINOPHEN 10 MG/ML IV SOLN
1000.0000 mg | Freq: Once | INTRAVENOUS | Status: DC
  Filled 2019-07-07: qty 100

## 2019-07-07 MED ORDER — ESCITALOPRAM OXALATE 20 MG PO TABS
20.0000 mg | ORAL_TABLET | Freq: Every day | ORAL | Status: DC
  Administered 2019-07-08 – 2019-07-10 (×3): 20 mg via ORAL
  Filled 2019-07-07 (×3): qty 1

## 2019-07-07 MED ORDER — LISINOPRIL 40 MG PO TABS
40.0000 mg | ORAL_TABLET | Freq: Every day | ORAL | Status: DC
  Administered 2019-07-07: 40 mg via ORAL
  Filled 2019-07-07: qty 1

## 2019-07-07 MED ORDER — DEXTROSE-NACL 5-0.45 % IV SOLN
INTRAVENOUS | Status: DC
  Administered 2019-07-07 – 2019-07-08 (×3): via INTRAVENOUS

## 2019-07-07 MED ORDER — POTASSIUM CHLORIDE 10 MEQ/100ML IV SOLN
10.0000 meq | INTRAVENOUS | Status: AC
  Administered 2019-07-07 (×3): 10 meq via INTRAVENOUS
  Filled 2019-07-07 (×3): qty 100

## 2019-07-07 MED ORDER — LABETALOL HCL 5 MG/ML IV SOLN: 5.0000 mg | INTRAVENOUS | Status: DC | PRN

## 2019-07-07 MED ORDER — LISINOPRIL 20 MG PO TABS: 20.0000 mg | ORAL_TABLET | Freq: Every day | ORAL | Status: DC

## 2019-07-07 MED ORDER — HYDRALAZINE HCL 50 MG PO TABS: 50.0000 mg | ORAL_TABLET | Freq: Two times a day (BID) | ORAL | Status: DC | PRN

## 2019-07-07 MED ORDER — LABETALOL HCL 100 MG PO TABS
100.0000 mg | ORAL_TABLET | Freq: Three times a day (TID) | ORAL | Status: DC
  Administered 2019-07-07 – 2019-07-09 (×6): 100 mg via ORAL
  Filled 2019-07-07 (×7): qty 1

## 2019-07-07 NOTE — Progress Notes (Signed)
Pt axillary temp 102.1. BP 169/81. Unsuccessfully attempted to administer PO PRN meds with assistance of pts daughter. MD Darrick Meigs paged and made aware. Will give one time dose of IV hydralazine and acetaminophen, and continue to monitor.

## 2019-07-07 NOTE — Progress Notes (Addendum)
IV tylenol tubed from pharmacy. Rechecked pt temp before administration. Pt axillary temp 99 F. Paged MD. Will hold IV tylenol per MD and continue to monitor.   Medication placed in pt bin.

## 2019-07-07 NOTE — Progress Notes (Addendum)
   Subjective:  Patient remained febrile overnight with maximum T 102.1. This morning Ms. Spada is easily arousable to voice but remains confused and unable to voice he needs. Daughter is at bedside. She has not noticed any improvement in mental status and continues to describes a waxing and waning nature to it. She was very appreciative of the medical team.   Objective:  Vital signs in last 24 hours: Vitals:   07/07/19 0310 07/07/19 0505 07/07/19 0630 07/07/19 0804  BP:  (!) 165/74 (!) 178/69 (!) 162/87  Pulse:      Resp:  (!) 30 (!) 27 (!) 32  Temp: (!) 102.1 F (38.9 C) 99 F (37.2 C)  98.8 F (37.1 C)  TempSrc: Axillary Axillary  Axillary  SpO2:    93%  Weight:      Height:       Gen: elderly female, sleeping comfortable in bed, arouses easily to voice, in no acute distress  CV: RRR, no murmurs  Pulm: CTAB, no increased WOB on RA  Ext: warm and well perfused without edema   Assessment/Plan:  Principal Problem:   Fever Active Problems:   AKI (acute kidney injury) (HCC)   Hypercalcemia   Hypomagnesemia, chronic   # Fever of unknown source: Remains febrile with T max 102.1 overnight and altered on exam. Zosyn was discontinued yesterday after > 72 hours without clinical improvement. She was also started on IV acyclovir per ID recommendations due to concern for HSV encephalitis in an immunocompromised patient. We plan to perform an LP tomorrow for further evaluation of fever. We are holding her Xarelto today to minimize risk of bleeding. Blood cultures remain negative at 48 hours. Unfortunately these are peripheral ones, not from her port. We also started maintenance IVF due to poor PO intake.   # PRES: Patient remains hypertensive with sBP 150s-170s despite starting of lisinopril and amlodipine yesterday. She received 1 dose of PO hydralazine 25 mg and IV hydralazine 5 mg overnight with no improvement in BP. Her mental status remains stable compared to yesterday, she is alert  but minimally verbal. I maximized amlodipine and added labetalol 100 mg 3 times daily. I also increased PRN PO hydralazine to 50 mg BID PRN. We will continue to monitor her BP closely and adjust anti-hypertensive regimen as needed.   # Hypercalcemia: This is in the setting of calcium supplementation for treatment of chronic hypocalcemia secondary to thyroidectomy.  Her corrected calcium today is 9.1.  We will continue to monitor and resume home supplementation when appropriate.  # AKI: This is resolved and her renal function is now at baseline.  Dispo: Pending completion of fever work-up and improvement in mental status.  Welford Roche, MD 07/07/2019, 9:50 AM Pager: [phone]

## 2019-07-07 NOTE — Progress Notes (Signed)
Pt BP unchanged from one time dose of IV hydralazine. BP 178/69. Pts daughter also asked to hold pt AM dose of synthroid until later in AM. MD Darrick Meigs made aware. Will continue to monitor.

## 2019-07-08 ENCOUNTER — Inpatient Hospital Stay (HOSPITAL_COMMUNITY): Payer: Medicare Other

## 2019-07-08 DIAGNOSIS — C801 Malignant (primary) neoplasm, unspecified: Secondary | ICD-10-CM

## 2019-07-08 DIAGNOSIS — C7960 Secondary malignant neoplasm of unspecified ovary: Secondary | ICD-10-CM

## 2019-07-08 DIAGNOSIS — D649 Anemia, unspecified: Secondary | ICD-10-CM

## 2019-07-08 LAB — CULTURE, BLOOD (ROUTINE X 2)
Culture: NO GROWTH
Culture: NO GROWTH
Special Requests: ADEQUATE

## 2019-07-08 LAB — CBC WITH DIFFERENTIAL/PLATELET
Abs Immature Granulocytes: 0.66 10*3/uL — ABNORMAL HIGH (ref 0.00–0.07)
Basophils Absolute: 0 10*3/uL (ref 0.0–0.1)
Basophils Relative: 0 %
Eosinophils Absolute: 0.1 10*3/uL (ref 0.0–0.5)
Eosinophils Relative: 1 %
HCT: 21.5 % — ABNORMAL LOW (ref 36.0–46.0)
Hemoglobin: 7.1 g/dL — ABNORMAL LOW (ref 12.0–15.0)
Immature Granulocytes: 7 %
Lymphocytes Relative: 21 %
Lymphs Abs: 2 10*3/uL (ref 0.7–4.0)
MCH: 33 pg (ref 26.0–34.0)
MCHC: 33 g/dL (ref 30.0–36.0)
MCV: 100 fL (ref 80.0–100.0)
Monocytes Absolute: 2.5 10*3/uL — ABNORMAL HIGH (ref 0.1–1.0)
Monocytes Relative: 26 %
Neutro Abs: 4.2 10*3/uL (ref 1.7–7.7)
Neutrophils Relative %: 45 %
Platelets: 346 10*3/uL (ref 150–400)
RBC: 2.15 MIL/uL — ABNORMAL LOW (ref 3.87–5.11)
RDW: 19.9 % — ABNORMAL HIGH (ref 11.5–15.5)
WBC: 9.5 10*3/uL (ref 4.0–10.5)
nRBC: 12.2 % — ABNORMAL HIGH (ref 0.0–0.2)

## 2019-07-08 LAB — COMPREHENSIVE METABOLIC PANEL
ALT: 18 U/L (ref 0–44)
AST: 25 U/L (ref 15–41)
Albumin: 2 g/dL — ABNORMAL LOW (ref 3.5–5.0)
Alkaline Phosphatase: 68 U/L (ref 38–126)
Anion gap: 9 (ref 5–15)
BUN: 7 mg/dL — ABNORMAL LOW (ref 8–23)
CO2: 24 mmol/L (ref 22–32)
Calcium: 7.1 mg/dL — ABNORMAL LOW (ref 8.9–10.3)
Chloride: 102 mmol/L (ref 98–111)
Creatinine, Ser: 1.09 mg/dL — ABNORMAL HIGH (ref 0.44–1.00)
GFR calc Af Amer: 58 mL/min — ABNORMAL LOW (ref >60)
GFR calc non Af Amer: 50 mL/min — ABNORMAL LOW (ref >60)
Glucose, Bld: 152 mg/dL — ABNORMAL HIGH (ref 70–99)
Potassium: 3 mmol/L — ABNORMAL LOW (ref 3.5–5.1)
Sodium: 135 mmol/L (ref 135–145)
Total Bilirubin: 0.4 mg/dL (ref 0.3–1.2)
Total Protein: 5 g/dL — ABNORMAL LOW (ref 6.5–8.1)

## 2019-07-08 LAB — CSF CELL COUNT WITH DIFFERENTIAL
RBC Count, CSF: 3 /mm3 — ABNORMAL HIGH
Tube #: 3
WBC, CSF: 0 /mm3 (ref 0–5)

## 2019-07-08 LAB — GLUCOSE, CSF: Glucose, CSF: 70 mg/dL (ref 40–70)

## 2019-07-08 LAB — MAGNESIUM: Magnesium: 1.4 mg/dL — ABNORMAL LOW (ref 1.7–2.4)

## 2019-07-08 LAB — PROTEIN, CSF: Total  Protein, CSF: 33 mg/dL (ref 15–45)

## 2019-07-08 MED ORDER — IPRATROPIUM-ALBUTEROL 0.5-2.5 (3) MG/3ML IN SOLN: 3.0000 mL | Freq: Four times a day (QID) | RESPIRATORY_TRACT | Status: DC

## 2019-07-08 MED ORDER — LIDOCAINE HCL (PF) 1 % IJ SOLN
5.0000 mL | Freq: Once | INTRAMUSCULAR | Status: AC
  Administered 2019-07-08: 5 mL via INTRADERMAL
  Filled 2019-07-08: qty 5

## 2019-07-08 MED ORDER — RIVAROXABAN 20 MG PO TABS: 20.0000 mg | ORAL_TABLET | Freq: Every day | ORAL | Status: DC

## 2019-07-08 MED ORDER — IPRATROPIUM-ALBUTEROL 0.5-2.5 (3) MG/3ML IN SOLN
3.0000 mL | Freq: Once | RESPIRATORY_TRACT | Status: AC
  Administered 2019-07-08: 3 mL via RESPIRATORY_TRACT
  Filled 2019-07-08: qty 3

## 2019-07-08 MED ORDER — IPRATROPIUM-ALBUTEROL 0.5-2.5 (3) MG/3ML IN SOLN
3.0000 mL | Freq: Four times a day (QID) | RESPIRATORY_TRACT | Status: DC | PRN
  Administered 2019-07-10 – 2019-07-11 (×6): 3 mL via RESPIRATORY_TRACT
  Filled 2019-07-08 (×6): qty 3

## 2019-07-08 MED ORDER — POTASSIUM CHLORIDE 10 MEQ/100ML IV SOLN
10.0000 meq | INTRAVENOUS | Status: AC
  Administered 2019-07-08 (×3): 10 meq via INTRAVENOUS
  Filled 2019-07-08 (×3): qty 100

## 2019-07-08 NOTE — Progress Notes (Signed)
PT Cancellation Note  Patient Details Name: Rhonda Hale MRN: [id number] DOB: 10/01/1946   Cancelled Treatment:    Reason Eval/Treat Not Completed: Patient at procedure or test/unavailable Pt going for LP. Will follow up as schedule allows.   Leighton Ruff, PT, DPT  Acute Rehabilitation Services  Pager: [phone] Office: [phone]  [name] 07/08/2019, 1:38 PM

## 2019-07-08 NOTE — Progress Notes (Addendum)
Paged by RN regarding respiratory distress in [name]. Examined and evaluated her at bedside. Daughter at bedside states she noted that Rhonda Hale appeared more agitated when sitting upright for medication administration. RN at bedside mentions that she was able to take her oral labetalol without obvious evidence of dysphagia. Rhonda Hale states that she is alert and able to answer question intermittently. She states she is experiencing shortness of breath. Also mentions chest pain but unable to describe further.   Gen: Well-developed, well nourished, NAD HEENT: small amount of blood noted on tongue, partially dissolved labetalol pill in mouth CV: Tachycardic, S1, S2 normal, No rubs, no murmurs Pulm: Expiratory wheezes bilaterally, basilar rales on L side Extm: ROM intact, Peripheral pulses intact, No peripheral edema Neuro: AAOx1 intermittently interactive  Respiratory distress 2/2 aspiration vs pulmonary edema vs undiagnosed obstructive disease Currently satting 92-97 on 2L [place]. RR 20s. Appear alert and not in distress, although intermittently agitated. Chart review shows hx of lung resection for suspected primary tumor with restrictive disease after. Also has COPD on problem list but no formal PFTs found. - Chest X-ray stat - Duoneb - Hold fluids for now - Depending on X-ray result, may need to start antibiotics for pneumonia coverage  Addendum: Chest X-ray read as multifocal opacities consistent with pneumonia. Unclear if bacterial due to atypical features. Currently vitals stable. Will get pro-calcitonin prior to initiating antibiotics.

## 2019-07-08 NOTE — Progress Notes (Signed)
Pt BP 110/54. Pt alert. Denies feeling lightheaded. MD Gilford Rile paged and made aware. Will continue to monitor.

## 2019-07-08 NOTE — Progress Notes (Signed)
  Subjective:  Daughter at bedside reports continued confusion. Reports occasional episodes of lucidity where mother speaks full phrases. No acute complaints. Daughter encouraged as patient has remained afebrile overnight.  Objective:   Vital Signs (last 24 hours): Vitals:   07/08/19 0401 07/08/19 0500 07/08/19 0501 07/08/19 0600  BP: 120/63  (!) 110/46 116/90  Pulse:      Resp: (!) 26  (!) 26 (!) 27  Temp:  98.5 F (36.9 C)    TempSrc:  Axillary    SpO2:      Weight:      Height:       Physical Exam: General Awake but not oriented, no acute distress  Cardiac Regular rate and rhythm, no murmurs, rubs, or gallops  Pulmonary Clear to auscultation bilaterally without wheezes, rhonchi, or rales  Abdominal Soft, non-tender, without distention  Extremities No peripheral edema     Assessment/Plan:   Principal Problem:   Fever Active Problems:   AKI (acute kidney injury) (HCC)   Hypercalcemia   Hypomagnesemia, chronic   PRES (posterior reversible encephalopathy syndrome)  Ms. Jollie is a 73 year old female with past medical history significant for ovarian cancer status post hysterectomy and splenectomy, on chemotherapy, presented to the ED following 2-day history of fever, chills, and elevated temperature to 103 Fahrenheit.   # Fever with AMS: .  Blood cultures show NGTD.  UA within normal limits and urine culture shows no growth.  CT scan and MRI brain do not reveal source for infection. Concern for PRESS on MR which may explain or be contributing to altered mental status. * Plans for LP today per IR - HSV, VZV, arborvirus panel ordered. Will restart Xarelto tonight * ID following, appreciate their continued recommendations  # PRESS: Blood pressure well controlled on current regimen with systolics ranging 588-502. Patient with elevated blood pressure with systolics 774-128.  Increased BP thought to be 2/2 bevacizumab.  * Continue amlodipine 10mg  QD and labetalol 100mg  TID  #  AKI: Creatinine of 1.09, at baseline  # Hypercalcemia: Elevated to 11.4 on 7/22, likely secondary to overrepletion. Improved with fluids. Corrected calcium of 8.3.  # Anemia: Iron studies consistent with anemia of chronic disease. Hemoglobin stable at 8.1   Dispo: Anticipated discharge pending clinical improvement.   Jeanmarie Hubert, MD 07/08/2019, 8:25 AM Pager: [phone]

## 2019-07-08 NOTE — Progress Notes (Signed)
[facility]  Date of Admission:  07/03/2019     Total days of antibiotics 4         ASSESSMENT/PLAN  Ms. Oshea is a 73 year old female with previous history of metastatic ovarian cancer currently on treatment with paclitaxel/avastain presenting with fever and found to have encephalopathy of unclear origin which appears to be slowly improving.   Fever - Fever curve is down trending with temperature max of 100.3 in the last 24 hours. Unclear source at present with blood cultures being negative.   Encephalopathy - On acyclovir while awaiting lumbar puncture today to rule out HSV, VZV, arbovirus. Certainly likely to be PRES with blood pressures now controlled. If CSF negative will discontinue acyclovir. Continue blood pressure management per primary team.    Principal Problem:   Fever Active Problems:   AKI (acute kidney injury) ([employer])   Hypercalcemia   Hypomagnesemia, chronic   PRES (posterior reversible encephalopathy syndrome)   . amLODipine  10 mg Oral Daily  . escitalopram  20 mg Oral Daily  . feeding supplement (ENSURE ENLIVE)  237 mL Oral BID BM  . labetalol  100 mg Oral TID  . levothyroxine  137 mcg Oral QAC breakfast  . lidocaine (PF)  5 mL Intradermal Once  . multivitamin with minerals  1 tablet Oral Daily  . sodium chloride flush  3 mL Intravenous Q12H    SUBJECTIVE:  Max temperature of 100.3 in the last 24 hours.  Fever curve appears to be downtrending.  Continues to have difficulty with speech.  Daughter notes speech improved over the last 48 hours.   Allergies  Allergen Reactions  . Atorvastatin Other (See Comments)    Severe heartburn, reflux     Review of Systems: Review of Systems  Constitutional: Negative for chills, fever and weight loss.  Respiratory: Negative for cough, shortness of breath and wheezing.   Cardiovascular: Negative for chest pain and leg swelling.  Gastrointestinal: Negative for abdominal pain,  constipation, diarrhea, nausea and vomiting.  Skin: Negative for rash.      OBJECTIVE: Vitals:   07/08/19 0500 07/08/19 0501 07/08/19 0600 07/08/19 0700  BP:  (!) 110/46 116/90 (!) 111/54  Pulse:      Resp:  (!) 26 (!) 27 (!) 28  Temp: 98.5 F (36.9 C)     TempSrc: Axillary     SpO2:      Weight:      Height:       Body mass index is 31.64 kg/m.  Physical Exam Constitutional:      General: She is not in acute distress.    Appearance: She is well-developed. She is ill-appearing.     Comments: Lying in bed with head of bed elevated; pleasant; difficulty finding words  Neck:     Musculoskeletal: Neck supple. No neck rigidity.  Cardiovascular:     Rate and Rhythm: Normal rate and regular rhythm.     Heart sounds: Normal heart sounds.  Pulmonary:     Effort: Pulmonary effort is normal.     Breath sounds: Normal breath sounds. No wheezing or rhonchi.  Lymphadenopathy:     Cervical: No cervical adenopathy.  Skin:    General: Skin is warm and dry.  Neurological:     Mental Status: She is alert.  Psychiatric:        Mood and Affect: Mood normal.     Lab Results Lab Results  Component Value Date   WBC 9.5 07/08/2019  HGB 7.1 (L) 07/08/2019   HCT 21.5 (L) 07/08/2019   MCV 100.0 07/08/2019   PLT 346 07/08/2019    Lab Results  Component Value Date   CREATININE 1.09 (H) 07/08/2019   BUN 7 (L) 07/08/2019   NA 135 07/08/2019   K 3.0 (L) 07/08/2019   CL 102 07/08/2019   CO2 24 07/08/2019    Lab Results  Component Value Date   ALT 18 07/08/2019   AST 25 07/08/2019   ALKPHOS 68 07/08/2019   BILITOT 0.4 07/08/2019     Microbiology: Recent Results (from the past 240 hour(s))  Blood Culture (routine x 2)     Status: None   Collection Time: 07/03/19 10:00 AM   Specimen: BLOOD RIGHT HAND  Result Value Ref Range Status   Specimen Description BLOOD RIGHT HAND  Final   Special Requests   Final    BOTTLES DRAWN AEROBIC AND ANAEROBIC Blood Culture adequate volume    Culture   Final    NO GROWTH 5 DAYS Performed at [facility], [address] [place], [place] [address]    Report Status 07/08/2019 FINAL  Final  Blood Culture (routine x 2)     Status: None   Collection Time: 07/03/19 10:07 AM   Specimen: BLOOD RIGHT WRIST  Result Value Ref Range Status   Specimen Description BLOOD RIGHT WRIST  Final   Special Requests   Final    BOTTLES DRAWN AEROBIC AND ANAEROBIC Blood Culture results may not be optimal due to an inadequate volume of blood received in culture bottles   Culture   Final    NO GROWTH 5 DAYS Performed at [facility], [place] [address], [place],  [address]    Report Status 07/08/2019 FINAL  Final  SARS Coronavirus 2 (CEPHEID- Performed in [facility] lab), Hosp Order     Status: None   Collection Time: 07/03/19 10:13 AM   Specimen: Nasopharyngeal Swab  Result Value Ref Range Status   SARS Coronavirus 2 NEGATIVE NEGATIVE Final    Comment: (NOTE) If result is NEGATIVE SARS-CoV-2 target nucleic acids are NOT DETECTED. The SARS-CoV-2 RNA is generally detectable in upper and lower  respiratory specimens during the acute phase of infection. The lowest  concentration of SARS-CoV-2 viral copies this assay can detect is 250  copies / mL. A negative result does not preclude SARS-CoV-2 infection  and should not be used as the sole basis for treatment or other  patient management decisions.  A negative result may occur with  improper specimen collection / handling, submission of specimen other  than nasopharyngeal swab, presence of viral mutation(s) within the  areas targeted by this assay, and inadequate number of viral copies  (<250 copies / mL). A negative result must be combined with clinical  observations, patient history, and epidemiological information. If result is POSITIVE SARS-CoV-2 target nucleic acids are DETECTED. The SARS-CoV-2 RNA is generally detectable in upper and lower  respiratory specimens  dur ing the acute phase of infection.  Positive  results are indicative of active infection with SARS-CoV-2.  Clinical  correlation with patient history and other diagnostic information is  necessary to determine patient infection status.  Positive results do  not rule out bacterial infection or co-infection with other viruses. If result is PRESUMPTIVE POSTIVE SARS-CoV-2 nucleic acids MAY BE PRESENT.   A presumptive positive result was obtained on the submitted specimen  and confirmed on repeat testing.  While 2019 novel coronavirus  (  SARS-CoV-2) nucleic acids may be present in the submitted sample  additional confirmatory testing may be necessary for epidemiological  and / or clinical management purposes  to differentiate between  SARS-CoV-2 and other Sarbecovirus currently known to infect humans.  If clinically indicated additional testing with an alternate test  methodology [phone]) is advised. The SARS-CoV-2 RNA is generally  detectable in upper and lower respiratory sp ecimens during the acute  phase of infection. The expected result is Negative. Fact Sheet for Patients:  StrictlyIdeas.no Fact Sheet for Healthcare Providers: BankingDealers.co.za This test is not yet approved or cleared by the [place] FDA and has been authorized for detection and/or diagnosis of SARS-CoV-2 by FDA under an Emergency Use Authorization (EUA).  This EUA will remain in effect (meaning this test can be used) for the duration of the COVID-19 declaration under Section 564(b)(1) of the Act, 21 U.S.C. section 360bbb-3(b)(1), unless the authorization is terminated or revoked sooner. Performed at [facility], [place] [address] [place], [place] [address]   Urine culture     Status: None   Collection Time: 07/03/19  4:11 PM   Specimen: In/Out Cath Urine  Result Value Ref Range Status   Specimen Description IN/OUT CATH URINE  Final   Special  Requests NONE  Final   Culture   Final    NO GROWTH Performed at [facility], [place] [address], [place], [place] [address]    Report Status 07/04/2019 FINAL  Final  MRSA PCR Screening     Status: None   Collection Time: 07/04/19  7:48 AM   Specimen: Nasal Mucosa; Nasopharyngeal  Result Value Ref Range Status   MRSA by PCR NEGATIVE NEGATIVE Final    Comment:        The GeneXpert MRSA Assay (FDA approved for NASAL specimens only), is one component of a comprehensive MRSA colonization surveillance program. It is not intended to diagnose MRSA infection nor to guide or monitor treatment for MRSA infections. Performed at [facility], [place] [address] [place], [place] [address]   Culture, blood (routine x 2)     Status: None (Preliminary result)   Collection Time: 07/05/19  7:12 AM   Specimen: BLOOD  Result Value Ref Range Status   Specimen Description BLOOD RIGHT ANTECUBITAL  Final   Special Requests   Final    BOTTLES DRAWN AEROBIC AND ANAEROBIC Blood Culture adequate volume   Culture   Final    NO GROWTH 3 DAYS Performed at [facility], [place] [address] [place], [place] [address]    Report Status PENDING  Incomplete  Culture, blood (routine x 2)     Status: None (Preliminary result)   Collection Time: 07/05/19  7:17 AM   Specimen: BLOOD RIGHT HAND  Result Value Ref Range Status   Specimen Description BLOOD RIGHT HAND  Final   Special Requests   Final    BOTTLES DRAWN AEROBIC ONLY Blood Culture adequate volume   Culture   Final    NO GROWTH 3 DAYS Performed at [facility], [place] [address], [place], [place] [address]    Report Status PENDING  Incomplete  Culture, blood (routine x 2)     Status: None (Preliminary result)   Collection Time: 07/06/19  3:08 AM   Specimen: BLOOD RIGHT HAND  Result Value Ref Range Status   Specimen Description BLOOD RIGHT HAND  Final   Special Requests   Final    BOTTLES DRAWN AEROBIC  AND ANAEROBIC Blood Culture  adequate volume   Culture   Final    NO GROWTH 2 DAYS Performed at [facility], [place] [address], [place],  [address]    Report Status PENDING  Incomplete     Terri Piedra, [place] [facility] Pager  07/08/2019  1:43 PM

## 2019-07-08 NOTE — Procedures (Signed)
Indication: Altered mental status, fever.  Procedure: A fluoroscopic guided lumbar puncture was performed under local anesthesia. A 20 gauge needle was inserted at the L2-3 level and 9 mL of clear CSF was collected into four tubes which were labelled and sent to pathology for further analysis. The patient tolerated the procedure well with no immediate post procedure complication.  Impression: Technically successful fluoroscopic guided lumbar puncture.  Davina Poke, DO Radiology

## 2019-07-09 ENCOUNTER — Inpatient Hospital Stay (HOSPITAL_COMMUNITY): Payer: Medicare Other

## 2019-07-09 DIAGNOSIS — R0603 Acute respiratory distress: Secondary | ICD-10-CM

## 2019-07-09 DIAGNOSIS — R0602 Shortness of breath: Secondary | ICD-10-CM

## 2019-07-09 LAB — COMPREHENSIVE METABOLIC PANEL
ALT: 20 U/L (ref 0–44)
ALT: 19 U/L (ref 0–44)
AST: 30 U/L (ref 15–41)
AST: 28 U/L (ref 15–41)
Albumin: 2.2 g/dL — ABNORMAL LOW (ref 3.5–5.0)
Albumin: 2.1 g/dL — ABNORMAL LOW (ref 3.5–5.0)
Alkaline Phosphatase: 67 U/L (ref 38–126)
Alkaline Phosphatase: 66 U/L (ref 38–126)
Anion gap: 11 (ref 5–15)
Anion gap: 11 (ref 5–15)
BUN: 12 mg/dL (ref 8–23)
BUN: 15 mg/dL (ref 8–23)
CO2: 20 mmol/L — ABNORMAL LOW (ref 22–32)
CO2: 18 mmol/L — ABNORMAL LOW (ref 22–32)
Calcium: 7.1 mg/dL — ABNORMAL LOW (ref 8.9–10.3)
Calcium: 6.8 mg/dL — ABNORMAL LOW (ref 8.9–10.3)
Chloride: 103 mmol/L (ref 98–111)
Chloride: 103 mmol/L (ref 98–111)
Creatinine, Ser: 2.63 mg/dL — ABNORMAL HIGH (ref 0.44–1.00)
Creatinine, Ser: 3.22 mg/dL — ABNORMAL HIGH (ref 0.44–1.00)
GFR calc Af Amer: 20 mL/min — ABNORMAL LOW (ref >60)
GFR calc Af Amer: 16 mL/min — ABNORMAL LOW (ref >60)
GFR calc non Af Amer: 17 mL/min — ABNORMAL LOW (ref >60)
GFR calc non Af Amer: 13 mL/min — ABNORMAL LOW (ref >60)
Glucose, Bld: 94 mg/dL (ref 70–99)
Glucose, Bld: 240 mg/dL — ABNORMAL HIGH (ref 70–99)
Potassium: 3.4 mmol/L — ABNORMAL LOW (ref 3.5–5.1)
Potassium: 3.8 mmol/L (ref 3.5–5.1)
Sodium: 134 mmol/L — ABNORMAL LOW (ref 135–145)
Sodium: 132 mmol/L — ABNORMAL LOW (ref 135–145)
Total Bilirubin: 0.7 mg/dL (ref 0.3–1.2)
Total Bilirubin: 0.3 mg/dL (ref 0.3–1.2)
Total Protein: 5.4 g/dL — ABNORMAL LOW (ref 6.5–8.1)
Total Protein: 5.1 g/dL — ABNORMAL LOW (ref 6.5–8.1)

## 2019-07-09 LAB — CBC WITH DIFFERENTIAL/PLATELET
Abs Immature Granulocytes: 1.08 10*3/uL — ABNORMAL HIGH (ref 0.00–0.07)
Basophils Absolute: 0.1 10*3/uL (ref 0.0–0.1)
Basophils Relative: 1 %
Eosinophils Absolute: 0.1 10*3/uL (ref 0.0–0.5)
Eosinophils Relative: 1 %
HCT: 23.3 % — ABNORMAL LOW (ref 36.0–46.0)
Hemoglobin: 7.6 g/dL — ABNORMAL LOW (ref 12.0–15.0)
Immature Granulocytes: 10 %
Lymphocytes Relative: 22 %
Lymphs Abs: 2.3 10*3/uL (ref 0.7–4.0)
MCH: 32.3 pg (ref 26.0–34.0)
MCHC: 32.6 g/dL (ref 30.0–36.0)
MCV: 99.1 fL (ref 80.0–100.0)
Monocytes Absolute: 2.4 10*3/uL — ABNORMAL HIGH (ref 0.1–1.0)
Monocytes Relative: 23 %
Neutro Abs: 4.7 10*3/uL (ref 1.7–7.7)
Neutrophils Relative %: 43 %
Platelets: 385 10*3/uL (ref 150–400)
RBC: 2.35 MIL/uL — ABNORMAL LOW (ref 3.87–5.11)
RDW: 20.7 % — ABNORMAL HIGH (ref 11.5–15.5)
WBC: 10.6 10*3/uL — ABNORMAL HIGH (ref 4.0–10.5)
nRBC: 15.3 % — ABNORMAL HIGH (ref 0.0–0.2)

## 2019-07-09 LAB — CBC
HCT: 22.7 % — ABNORMAL LOW (ref 36.0–46.0)
Hemoglobin: 7.3 g/dL — ABNORMAL LOW (ref 12.0–15.0)
MCH: 32.9 pg (ref 26.0–34.0)
MCHC: 32.2 g/dL (ref 30.0–36.0)
MCV: 102.3 fL — ABNORMAL HIGH (ref 80.0–100.0)
Platelets: 370 10*3/uL (ref 150–400)
RBC: 2.22 MIL/uL — ABNORMAL LOW (ref 3.87–5.11)
RDW: 20.4 % — ABNORMAL HIGH (ref 11.5–15.5)
WBC: 10.7 10*3/uL — ABNORMAL HIGH (ref 4.0–10.5)
nRBC: 14.1 % — ABNORMAL HIGH (ref 0.0–0.2)

## 2019-07-09 LAB — TECHNOLOGIST SMEAR REVIEW

## 2019-07-09 LAB — TROPONIN I (HIGH SENSITIVITY): Troponin I (High Sensitivity): 18 ng/L — ABNORMAL HIGH (ref <18)

## 2019-07-09 LAB — APTT: aPTT: 44 seconds — ABNORMAL HIGH (ref 24–36)

## 2019-07-09 LAB — PROCALCITONIN: Procalcitonin: 0.28 ng/mL

## 2019-07-09 LAB — MAGNESIUM
Magnesium: 1.3 mg/dL — ABNORMAL LOW (ref 1.7–2.4)
Magnesium: 4.2 mg/dL — ABNORMAL HIGH (ref 1.7–2.4)

## 2019-07-09 LAB — HEPARIN LEVEL (UNFRACTIONATED): Heparin Unfractionated: <0.1 IU/mL — ABNORMAL LOW (ref 0.30–0.70)

## 2019-07-09 MED ORDER — DEXTROSE-NACL 5-0.45 % IV SOLN
INTRAVENOUS | Status: DC
  Administered 2019-07-10: 01:00:00 via INTRAVENOUS

## 2019-07-09 MED ORDER — POTASSIUM CHLORIDE 10 MEQ/100ML IV SOLN
10.0000 meq | INTRAVENOUS | Status: AC
  Administered 2019-07-09 (×3): 10 meq via INTRAVENOUS
  Filled 2019-07-09 (×4): qty 100

## 2019-07-09 MED ORDER — HYDRALAZINE HCL 25 MG PO TABS: 25.0000 mg | ORAL_TABLET | Freq: Two times a day (BID) | ORAL | Status: DC | PRN

## 2019-07-09 MED ORDER — MAGNESIUM SULFATE 50 % IJ SOLN: 2.0000 g | Freq: Once | INTRAMUSCULAR | Status: DC

## 2019-07-09 MED ORDER — SODIUM CHLORIDE 0.9 % IV SOLN
500.0000 mg | INTRAVENOUS | Status: DC
  Administered 2019-07-09 – 2019-07-11 (×3): 500 mg via INTRAVENOUS
  Filled 2019-07-09 (×4): qty 500

## 2019-07-09 MED ORDER — INSULIN ASPART 100 UNIT/ML ~~LOC~~ SOLN
10.0000 [IU] | Freq: Once | SUBCUTANEOUS | Status: AC
  Administered 2019-07-09: 10 [IU] via SUBCUTANEOUS

## 2019-07-09 MED ORDER — DEXTROSE 50 % IV SOLN
INTRAVENOUS | Status: AC
  Filled 2019-07-09: qty 50

## 2019-07-09 MED ORDER — RIVAROXABAN 20 MG PO TABS: 20.0000 mg | ORAL_TABLET | Freq: Every day | ORAL | Status: DC

## 2019-07-09 MED ORDER — MAGNESIUM SULFATE 2 GM/50ML IV SOLN
2.0000 g | Freq: Once | INTRAVENOUS | Status: AC
  Administered 2019-07-09: 2 g via INTRAVENOUS
  Filled 2019-07-09: qty 50

## 2019-07-09 MED ORDER — BOOST / RESOURCE BREEZE PO LIQD CUSTOM
1.0000 | Freq: Three times a day (TID) | ORAL | Status: DC
  Administered 2019-07-09 – 2019-07-14 (×10): 1 via ORAL

## 2019-07-09 MED ORDER — HEPARIN (PORCINE) 25000 UT/250ML-% IV SOLN
900.0000 [IU]/h | INTRAVENOUS | Status: DC
  Administered 2019-07-09 – 2019-07-11 (×3): 1100 [IU]/h via INTRAVENOUS
  Administered 2019-07-13 – 2019-07-14 (×2): 900 [IU]/h via INTRAVENOUS
  Filled 2019-07-09 (×6): qty 250

## 2019-07-09 MED ORDER — DEXTROSE 5 % IV SOLN
615.0000 mg | INTRAVENOUS | Status: DC
  Filled 2019-07-09: qty 12.3

## 2019-07-09 MED ORDER — LABETALOL HCL 100 MG PO TABS: 100.0000 mg | ORAL_TABLET | Freq: Two times a day (BID) | ORAL | Status: DC

## 2019-07-09 MED ORDER — SODIUM CHLORIDE 0.9 % IV SOLN
2.0000 g | INTRAVENOUS | Status: DC
  Administered 2019-07-09: 2 g via INTRAVENOUS
  Filled 2019-07-09: qty 20

## 2019-07-09 MED ORDER — PRO-STAT SUGAR FREE PO LIQD
30.0000 mL | Freq: Two times a day (BID) | ORAL | Status: DC
  Administered 2019-07-10 – 2019-07-17 (×12): 30 mL via ORAL
  Filled 2019-07-09 (×11): qty 30

## 2019-07-09 MED ORDER — DEXTROSE 50 % IV SOLN
1.0000 | Freq: Once | INTRAVENOUS | Status: AC
  Administered 2019-07-09: 50 mL via INTRAVENOUS
  Filled 2019-07-09: qty 50

## 2019-07-09 MED ORDER — CALCIUM GLUCONATE-NACL 1-0.675 GM/50ML-% IV SOLN
1.0000 g | Freq: Once | INTRAVENOUS | Status: AC
  Administered 2019-07-09: 1000 mg via INTRAVENOUS
  Filled 2019-07-09: qty 50

## 2019-07-09 MED ORDER — CALCIUM CARBONATE-VITAMIN D 500-200 MG-UNIT PO TABS
1.0000 | ORAL_TABLET | Freq: Two times a day (BID) | ORAL | Status: DC
  Administered 2019-07-10 – 2019-07-17 (×13): 1 via ORAL
  Filled 2019-07-09 (×14): qty 1

## 2019-07-09 NOTE — Progress Notes (Signed)
At approximately 1335 pt was found unresponsive on the bedside commode. Pt vital signs WNL. Unresponsive to sternal rub. Rapid response was notified. MD notified. Moved patient to bed. Rapid response nurse arrived and attempted gag reflex which the patient responded to and became alert and able to follow commands and answer questions. STAT meds ordered and given. EKG and CT scan of head completed.

## 2019-07-09 NOTE — Care Management Important Message (Signed)
Important Message  Patient Details  Name: Rhonda Hale MRN: [id number] Date of Birth: 31-May-1946   Medicare Important Message Given:  Yes     [name] 07/09/2019, 1:05 PM

## 2019-07-09 NOTE — Significant Event (Signed)
Rapid Response Event Note  Overview: Time Called: 1334 Arrival Time: 1400 Event Type: Neurologic  Initial Focused Assessment: Rn called because patient became unresponsive on BSC.  BP 101/59 and 121/59 Recommended assisting patient to bed if she is still unresponsive call me back RN also notified MD. Upon my arrival patient in bed still unresponsive to sternal rub.  Used oral sponge to gag patient.  She has a gag and started reaching for sponge and awoke.  Interactive and at neuro baseline for this hospitalization.    Interventions: Stat head CT Labs Electrolyte replacement ordered     Plan of Care (if not transferred): RN to call if assistance needed.  Event Summary: Name of Physician Notified: Anette Guarneri  IMTS resident at 1335    at    Outcome: Stayed in room and stabalized  Event End Time: [place]  Raliegh Ip

## 2019-07-09 NOTE — Progress Notes (Signed)
[facility] for Infectious Disease  Date of Admission:  07/03/2019     Total days of antibiotics 4         ASSESSMENT/PLAN  Rhonda Hale is a 73 year old female with previous history of metastatic ovarian cancer currently on treatment with paclitaxel/avastain admitted with fever and found to have encephalopathy of unclear origin with differentials including PRES. CSF fluid with no evidence of infection/meningitis to suggest alternative.  Did have new onset shortness of breath in the last 24 hours although unlikely pneumonia at present time given her clinical exam.   Fever - fever curve continues to improve with most recent temperature of 100.  Cultures remain negative.  Recommend no additional treatment at this time and continue to monitor.  Encephalopathy -worsening agitation and shortness of breath overnight.  Lumbar puncture without evidence of infection/meningitis.  Awaiting results of HSV, VZV and arbovirus which appear unlikely given the current situation.  We will stop acyclovir and monitor off all antimicrobials at this time.  Continue blood pressure management per primary team.   Infectious disease will be available as needed during remainder of Rhonda Hale Hale stay as there does not appear to be any infectious causes at present.    Principal Problem:   PRES (posterior reversible encephalopathy syndrome) Active Problems:   Fever   AKI (acute kidney injury) ([place])   Hypercalcemia   Hypomagnesemia, chronic   . amLODipine  10 mg Oral Daily  . escitalopram  20 mg Oral Daily  . feeding supplement (ENSURE ENLIVE)  237 mL Oral BID BM  . labetalol  100 mg Oral TID  . levothyroxine  137 mcg Oral QAC breakfast  . multivitamin with minerals  1 tablet Oral Daily  . [START ON 07/10/2019] rivaroxaban  20 mg Oral Daily  . sodium chloride flush  3 mL Intravenous Q12H    SUBJECTIVE:  Afebrile overnight. WBC count stable at 10.6. Shortness of breath overnight with increased  agitation. Chest x-ray reviewed with mild patchy opacities bilaterally.  Daughter reports breathing now like she normally does at home.   Allergies  Allergen Reactions  . Atorvastatin Other (See Comments)    Severe heartburn, reflux     Review of Systems: Review of Systems  Constitutional: Negative for chills, fever, malaise/fatigue and weight loss.  Respiratory: Negative for cough, sputum production, shortness of breath and wheezing.   Cardiovascular: Negative for chest pain and leg swelling.  Gastrointestinal: Negative for abdominal pain, constipation, diarrhea, nausea and vomiting.  Skin: Negative for rash.      OBJECTIVE: Vitals:   07/08/19 2158 07/08/19 2221 07/09/19 0024 07/09/19 0413  BP:  121/66 139/69 132/65  Pulse:  97 100 90  Resp:  (!) 29 (!) 24 20  Temp:   98.2 F (36.8 C) 98.1 F (36.7 C)  TempSrc:   Axillary Axillary  SpO2: 97% 96% 93% 93%  Weight:      Height:       Body mass index is 31.64 kg/m.  Physical Exam Constitutional:      General: She is not in acute distress.    Appearance: She is well-developed. She is ill-appearing.     Interventions: Nasal cannula in place.     Comments: Lying in bed with head of bed elevated; resting.   Cardiovascular:     Rate and Rhythm: Normal rate and regular rhythm.     Heart sounds: Normal heart sounds.  Pulmonary:     Effort: Pulmonary effort is normal.  Breath sounds: Normal breath sounds. No wheezing, rhonchi or rales.  Chest:     Chest wall: No tenderness.  Skin:    General: Skin is warm and dry.  Neurological:     Mental Status: She is alert and oriented to person, place, and time.  Psychiatric:        Mood and Affect: Mood normal.     Lab Results Lab Results  Component Value Date   WBC 10.6 (H) 07/09/2019   HGB 7.6 (L) 07/09/2019   HCT 23.3 (L) 07/09/2019   MCV 99.1 07/09/2019   PLT 385 07/09/2019    Lab Results  Component Value Date   CREATININE 2.63 (H) 07/09/2019   BUN 12  07/09/2019   NA 134 (L) 07/09/2019   K 3.4 (L) 07/09/2019   CL 103 07/09/2019   CO2 20 (L) 07/09/2019    Lab Results  Component Value Date   ALT 20 07/09/2019   AST 30 07/09/2019   ALKPHOS 67 07/09/2019   BILITOT 0.7 07/09/2019     Microbiology: Recent Results (from the past 240 hour(s))  Blood Culture (routine x 2)     Status: None   Collection Time: 07/03/19 10:00 AM   Specimen: BLOOD RIGHT HAND  Result Value Ref Range Status   Specimen Description BLOOD RIGHT HAND  Final   Special Requests   Final    BOTTLES DRAWN AEROBIC AND ANAEROBIC Blood Culture adequate volume   Culture   Final    NO GROWTH 5 DAYS Performed at [facility], [address] [place], [place] [address]    Report Status 07/08/2019 FINAL  Final  Blood Culture (routine x 2)     Status: None   Collection Time: 07/03/19 10:07 AM   Specimen: BLOOD RIGHT WRIST  Result Value Ref Range Status   Specimen Description BLOOD RIGHT WRIST  Final   Special Requests   Final    BOTTLES DRAWN AEROBIC AND ANAEROBIC Blood Culture results may not be optimal due to an inadequate volume of blood received in culture bottles   Culture   Final    NO GROWTH 5 DAYS Performed at [facility], [place] [address], [place], [place] [address]    Report Status 07/08/2019 FINAL  Final  SARS Coronavirus 2 (CEPHEID- Performed in [facility]), Hosp Order     Status: None   Collection Time: 07/03/19 10:13 AM   Specimen: Nasopharyngeal Swab  Result Value Ref Range Status   SARS Coronavirus 2 NEGATIVE NEGATIVE Final    Comment: (NOTE) If result is NEGATIVE SARS-CoV-2 target nucleic acids are NOT DETECTED. The SARS-CoV-2 RNA is generally detectable in upper and lower  respiratory specimens during the acute phase of infection. The lowest  concentration of SARS-CoV-2 viral copies this assay can detect is 250  copies / mL. A negative result does not preclude SARS-CoV-2 infection  and should not be used as the sole  basis for treatment or other  patient management decisions.  A negative result may occur with  improper specimen collection / handling, submission of specimen other  than nasopharyngeal swab, presence of viral mutation(s) within the  areas targeted by this assay, and inadequate number of viral copies  (<250 copies / mL). A negative result must be combined with clinical  observations, patient history, and epidemiological information. If result is POSITIVE SARS-CoV-2 target nucleic acids are DETECTED. The SARS-CoV-2 RNA is generally detectable in upper and lower  respiratory specimens dur ing the  acute phase of infection.  Positive  results are indicative of active infection with SARS-CoV-2.  Clinical  correlation with patient history and other diagnostic information is  necessary to determine patient infection status.  Positive results do  not rule out bacterial infection or co-infection with other viruses. If result is PRESUMPTIVE POSTIVE SARS-CoV-2 nucleic acids MAY BE PRESENT.   A presumptive positive result was obtained on the submitted specimen  and confirmed on repeat testing.  While 2019 novel coronavirus  (SARS-CoV-2) nucleic acids may be present in the submitted sample  additional confirmatory testing may be necessary for epidemiological  and / or clinical management purposes  to differentiate between  SARS-CoV-2 and other Sarbecovirus currently known to infect humans.  If clinically indicated additional testing with an alternate test  methodology [phone]) is advised. The SARS-CoV-2 RNA is generally  detectable in upper and lower respiratory sp ecimens during the acute  phase of infection. The expected result is Negative. Fact Sheet for Patients:  StrictlyIdeas.no Fact Sheet for Healthcare Providers: BankingDealers.co.za This test is not yet approved or cleared by the [place] FDA and has been authorized for detection  and/or diagnosis of SARS-CoV-2 by FDA under an Emergency Use Authorization (EUA).  This EUA will remain in effect (meaning this test can be used) for the duration of the COVID-19 declaration under Section 564(b)(1) of the Act, 21 U.S.C. section 360bbb-3(b)(1), unless the authorization is terminated or revoked sooner. Performed at [facility], [place] [address], [place], [place] [address]   Urine culture     Status: None   Collection Time: 07/03/19  4:11 PM   Specimen: In/Out Cath Urine  Result Value Ref Range Status   Specimen Description IN/OUT CATH URINE  Final   Special Requests NONE  Final   Culture   Final    NO GROWTH Performed at [facility], [place] [address], [place], [place] [address]    Report Status 07/04/2019 FINAL  Final  MRSA PCR Screening     Status: None   Collection Time: 07/04/19  7:48 AM   Specimen: Nasal Mucosa; Nasopharyngeal  Result Value Ref Range Status   MRSA by PCR NEGATIVE NEGATIVE Final    Comment:        The GeneXpert MRSA Assay (FDA approved for NASAL specimens only), is one component of a comprehensive MRSA colonization surveillance program. It is not intended to diagnose MRSA infection nor to guide or monitor treatment for MRSA infections. Performed at [facility], [place] [address] [place], [place] [address]   Culture, blood (routine x 2)     Status: None (Preliminary result)   Collection Time: 07/05/19  7:12 AM   Specimen: BLOOD  Result Value Ref Range Status   Specimen Description BLOOD RIGHT ANTECUBITAL  Final   Special Requests   Final    BOTTLES DRAWN AEROBIC AND ANAEROBIC Blood Culture adequate volume   Culture   Final    NO GROWTH 4 DAYS Performed at [facility], [place] [address] [place], [place] [address]    Report Status PENDING  Incomplete  Culture, blood (routine x 2)     Status: None (Preliminary result)   Collection Time: 07/05/19  7:17 AM   Specimen: BLOOD RIGHT HAND  Result Value Ref Range Status    Specimen Description BLOOD RIGHT HAND  Final   Special Requests   Final    BOTTLES DRAWN AEROBIC ONLY Blood Culture adequate volume   Culture  Final    NO GROWTH 4 DAYS Performed at [facility], [place] [address] [place], [place] [address]    Report Status PENDING  Incomplete  Culture, blood (routine x 2)     Status: None (Preliminary result)   Collection Time: 07/06/19  3:08 AM   Specimen: BLOOD RIGHT HAND  Result Value Ref Range Status   Specimen Description BLOOD RIGHT HAND  Final   Special Requests   Final    BOTTLES DRAWN AEROBIC AND ANAEROBIC Blood Culture adequate volume   Culture   Final    NO GROWTH 3 DAYS Performed at [facility], [place] [address], [place], [place] [address]    Report Status PENDING  Incomplete  CSF culture     Status: None (Preliminary result)   Collection Time: 07/08/19  3:16 PM   Specimen: CSF; Cerebrospinal Fluid  Result Value Ref Range Status   Specimen Description CSF  Final   Special Requests NONE  Final   Gram Stain   Final    WBC PRESENT, PREDOMINANTLY MONONUCLEAR NO ORGANISMS SEEN CYTOSPIN SMEAR    Culture   Final    NO GROWTH < 24 HOURS Performed at [facility], [place] [address] [place], [place] [address]    Report Status PENDING  Incomplete     Terri Piedra, NP [facility] for [facility] Pager  07/09/2019  9:39 AM

## 2019-07-09 NOTE — Progress Notes (Signed)
Nutrition Follow-up  RD working remotely.  DOCUMENTATION CODES:   Obesity unspecified  INTERVENTION:   -D/c Ensure Enlive due to poor acceptance -Continue MVI with minerals daily -Magic cup TID with meals, each supplement provides 290 kcal and 9 grams of protein -30 ml Prostat BID with meals, each supplement provides 100 kcals and 15 grams protein -Boost Breeze po TID, each supplement provides 250 kcal and 9 grams of protein  NUTRITION DIAGNOSIS:   Increased nutrient needs related to cancer and cancer related treatments as evidenced by estimated needs.  Ongoing  GOAL:   Patient will meet greater than or equal to 90% of their needs  Unmet  MONITOR:   PO intake, Supplement acceptance, Labs, Weight trends, Skin, I & O's  REASON FOR ASSESSMENT:   Consult Assessment of nutrition requirement/status  ASSESSMENT:   Rhonda Hale is a 73 yo F with a PMHx of metastatic ovarian cancer and PE on Xarelto who presents with fever with no infectious sx being treated with broad spectrum antibiotics with blood and urine cx pending.  7/27- s/p lumbar puncture, CXR reveal pneumonia  Reviewed I/O's: +1.5 L x 24 hours and +5.5 L since admission  UOP: 150 ml x 24 hours  Pt remains with confusion, which has not improved much per MD notes. Pt has been refusing food- she requested ice cream yesterday and refused meal when it came. She has also been refusing Ensure supplements.   No new wt to assess since last visit.   If poor oral intake continues, may need to consider initiation of nutrition support if confusion and/or PO intake does not improve.   Labs reviewed: Na: 134 (on IV supplementation), K: 3.4 (on IV supplementation), Mg: 1.3.   Diet Order:   Diet Order            Diet regular Room service appropriate? Yes with Assist; Fluid consistency: Thin  Diet effective now              EDUCATION NEEDS:   No education needs have been identified at this time  Skin:  Skin  Assessment: Reviewed RN Assessment  Last BM:  07/07/19  Height:   Ht Readings from Last 1 Encounters:  07/03/19 5\' 2"  (1.575 m)    Weight:   Wt Readings from Last 1 Encounters:  07/03/19 78.5 kg    Ideal Body Weight:  50 kg  BMI:  Body mass index is 31.64 kg/m.  Estimated Nutritional Needs:   Kcal:  3419-6222  Protein:  85-100 grams  Fluid:  > 1.7 L    Indy Kuck A. Jimmye Norman, RD, LDN, [place] Registered Dietitian II Certified Diabetes Care and Education Specialist Pager: [phone] After hours Pager: [phone]

## 2019-07-09 NOTE — Discharge Summary (Addendum)
Name: Rhonda Hale MRN: [id number] DOB: 12-Sep-1946 73 y.o. PCP: System, Pcp Not In  Date of Admission: 07/03/2019  9:07 AM Date of Discharge:  07/17/2019 Attending Physician: Lucious Groves, DO  Discharge Diagnosis:  1. Pneumonia 2. PRES Syndrome 3. Ischemic ATN 4. Anemia  5. Hypomagnesemia   Discharge Medications: Allergies as of 07/17/2019      Reactions   Atorvastatin Other (See Comments)   Severe heartburn, reflux      Medication List    STOP taking these medications   buPROPion 150 MG 24 hr tablet Commonly known as: WELLBUTRIN XL     TAKE these medications   amLODipine 10 MG tablet Commonly known as: NORVASC Take 1 tablet (10 mg total) by mouth daily. Start taking on: July 18, 2019   calcitRIOL 0.5 MCG capsule Commonly known as: ROCALTROL Take 0.5 mcg by mouth 2 (two) times a day.   Calcium-Vitamin D 600-400 MG-UNIT Tabs Take 1 tablet by mouth 2 (two) times a day.   dexamethasone 4 MG tablet Commonly known as: DECADRON Take 4 mg by mouth as needed. After Chemotherapy   escitalopram 20 MG tablet Commonly known as: LEXAPRO Take 20 mg by mouth daily.   gabapentin 300 MG capsule Commonly known as: NEURONTIN Take 600 mg by mouth at bedtime.   labetalol 100 MG tablet Commonly known as: NORMODYNE Take 0.5 tablets (50 mg total) by mouth 2 (two) times daily.   levothyroxine 137 MCG tablet Commonly known as: SYNTHROID Take 137 mcg by mouth daily before breakfast.   lidocaine-prilocaine cream Commonly known as: EMLA Apply 1 application topically as needed.   ondansetron 8 MG tablet Commonly known as: ZOFRAN Take 8 mg by mouth every 8 (eight) hours as needed for nausea or vomiting.   predniSONE 20 MG tablet Commonly known as: DELTASONE Take 2 tablets (40 mg total) by mouth daily with breakfast for 8 doses. Start taking on: July 18, 2019   predniSONE 20 MG tablet Commonly known as: DELTASONE Take 1 tablet (20 mg total) by mouth daily with breakfast  for 11 days. Start taking on: July 22, 2019   rivaroxaban 20 MG Tabs tablet Commonly known as: XARELTO Take 20 mg by mouth daily.   sulfamethoxazole-trimethoprim 800-160 MG tablet Commonly known as: BACTRIM DS Take 2 tablets by mouth every 8 (eight) hours for 16 days.            Durable Medical Equipment  (From admission, onward)         Start     Ordered   07/17/19 1052  DME 3-in-1  Once     07/17/19 1056   07/04/19 1613  For home use only DME Bedside commode  Once    Comments: 3 in 1 bedside commode  Question:  Patient needs a bedside commode to treat with the following condition  Answer:  Physical deconditioning   07/04/19 1613        Disposition and follow-up:   Rhonda Hale was discharged from [facility] in Stable condition.  At the hospital follow up visit please address:   1.  PRES. Assess her BP and the need for continuing antihypertensives. Our thought is that this is secondary to the Bevacizumab and therefore she may not need BP meds long term. PNA. Ensure she is taking her steroids as prescribed. She will follow-up with ID on 08/15/2019  2.  Labs / imaging needed at time of follow-up: BMP, CBC  3.  Pending labs/ test needing  follow-up: None  Follow-up Appointments: Follow-up Information    [facility] Follow up.   Specialty: Home Health Services Why: [place] arranged.  Contact information: [place] [address] [phone]        [facility] Follow up on 08/15/2019.   Specialty: Infectious Diseases Why: At 9:45. Please arrive at 9:30.  Contact information: [place], [place] [phone] [place] Follow up on 07/24/2019.   Why: @10 :15AM Contact information: [address] [place] [facility] Course by  problem list:   PRES. Patient's mental status continued to decline prompting further investigation with a MRI brain that illustrated findings consistent with PRES. Her blood pressure was aggressively managed with a target SBP <140. Her BP was ultimately well controlled with Labetalol and amlodipine. The origin of her PRES was thought to be secondary to the use of Bevacizumab.  Pneumonia. Patient initially presented on 7/22 with fevers and encephalopathy. Given her metastatic ovarian cancer and use of chemotherapy she was started on broad spectrum antibiotics and admitted to the hospital. CXR, UA, CT head, and LP were unremarkable. No obvious source of infection was identified. Initially the patient's chest x-ray was clear without definitive infiltrate; however, during the patient's stay she developed dyspnea and cough. CT chest illustrated bilateral ground glass opacities and her oxygen requirements increased. She was placed on broad spectrum antibiotics again and infectious disease was consulted. Her antibiotics were narrowed to Cefepime and Bactrim was added for possible PCP. Over the course of the next 5-7 days her oxygen requirements improved and she completed a 7 day course of Cefepime. She was discharged on Bactrim and prednisone with follow-up with infectious disease on 08/15/2019.  Ischemic ATN. After the patient developed PRES and she was started on BP medications. She subsequently became hypotensive and oligouric. Creatinine steadily rose to peak at 3.59. She was managed symptomatically and her creatinine began to trend down. Her urine output significantly improved and she was discharged with a creatinine at baseline of 1.20.   Macrocytic anemia. Patient with chronic anemia. During her stay her Hgb slowly trended down, which was attributed to her acute illnesses. She was given 1 unit pRBCs during her stay and her Hgb remained stable.    Discharge Vitals:   BP (!) 119/57   Pulse 79   Temp 98.3 F  (36.8 C)   Resp (!) 37   Ht 5\' 2"  (1.575 m)   Wt 80.3 kg   SpO2 94%   BMI 32.38 kg/m   Pertinent Labs, Studies, and Procedures:  None  Discharge Instructions: Discharge Instructions    Call MD for:  temperature >100.4   Complete by: As directed    Diet - low sodium heart healthy   Complete by: As directed    Discharge instructions   Complete by: As directed    Thank you for allowing [place] to provide your care. Please take all you medications as prescribed. You are scheduled for a follow-up with our clinic and with the infectious disease. If you have any questions about your medications please feel free to call our clinic at [phone].   Increase activity slowly   Complete by: As directed     Signed: [name], MD 07/17/2019, 10:56 AM

## 2019-07-09 NOTE — Progress Notes (Signed)
Physical Therapy Treatment Patient Details Name: Rhonda Hale MRN: [id number] DOB: Jan 05, 1946 Today's Date: 07/09/2019    History of Present Illness Rhonda Hale is a 73 y.o. woman with PMHx significant for ovarian cancer status post hysterectomy and splenectomy, on chemotherapy, presented to ED with fever and chills, maximum temperature 103F, and nontraumatic fall at home prior to arrival. Associated symptoms include decreased appetite, decreased oral intake, fatigue and weakness for two days.     PT Comments    Pt agreed to get up today with encouragement.  Pt weaker than she was on PT eval 5 days ago.  Pt with eyes closed most of session and delayed/inconsistent with following commands.  pts daughter (who is a Marine scientist) present and helpful.  Pt needed +2 help to sit up, stand up and turn to get in the chiar.  Once up she was abel to bear her weight on her legs.  Since pt did well 5 days ago - hoping pt will be strong enough to return home with family and [place] assist.  Pts daughter was happy with how she did today.   Follow Up Recommendations  Home health PT;Supervision/Assistance - 24 hour     Equipment Recommendations  None recommended by PT    Recommendations for Other Services       Precautions / Restrictions Precautions Precautions: Fall Precaution Comments: pt hasnt been OOB in 5 days - but walked 15' five days ago. Restrictions Other Position/Activity Restrictions: pts daughter present 24/7 and is a nurse    Mobility  Bed Mobility Overal bed mobility: Needs Assistance Bed Mobility: Supine to Sit     Supine to sit: Max assist;+2 for physical assistance     General bed mobility comments: tried to have pt roll on her side and grab bar adn help get into sitting - pt refused and wanted to stay in bed.  Daughter adn I helped her into sitting today with minimal help from pt  Transfers Overall transfer level: Needs assistance Equipment used: Rolling walker (2  wheeled) Transfers: Sit to/from [employer] Transfers Sit to Stand: From elevated surface;Mod assist Stand pivot transfers: Mod assist;+2 safety/equipment       General transfer comment: pt stood from elevated bed - pt wouldnt assist in pushing up - i let her hold the walker.  I used chuck to help her get into standing - then she was abel to get her balane and maintain standing. p t continued to keep her eyes closed - slow to respond to commands.  Ambulation/Gait             General Gait Details: pt unable to walk today safely.  I had pt march 6 steps in place and then turn with RW and back up 2 steps to the chair.  she was exhausted after this and positioned to sit up with daughter present   Marine scientist [place] (Stroke Patients Only)       Balance Overall balance assessment: History of Falls                                          Cognition Arousal/Alertness: Lethargic Behavior During Therapy: Flat affect  General Comments: pt kept eyes closed 70% of the time.  pt keeps rubbing left eye - pts duaghter aware - said it seems like pt has depth perception issues recently and tries to keep one eye (or both eyes) closed most of the time.      Exercises      General Comments        Pertinent Vitals/Pain Pain Assessment: No/denies pain    Home Living                      Prior Function            PT Goals (current goals can now be found in the care plan section) Progress towards PT goals: Not progressing toward goals - comment(pt is not as strong as she was at eval 5 days ago.  pts daughter has been with her - pt has been more confused etc per daughter)    Frequency    Min 3X/week      PT Plan Current plan remains appropriate    Co-evaluation              AM-PAC PT "6 Clicks" Mobility   Outcome Measure  Help needed  turning from your back to your side while in a flat bed without using bedrails?: A Lot Help needed moving from lying on your back to sitting on the side of a flat bed without using bedrails?: A Lot Help needed moving to and from a bed to a chair (including a wheelchair)?: A Lot Help needed standing up from a chair using your arms (e.g., wheelchair or bedside chair)?: A Lot Help needed to walk in hospital room?: Total Help needed climbing 3-5 steps with a railing? : Total 6 Click Score: 10    End of Session Equipment Utilized During Treatment: Gait belt Activity Tolerance: Patient limited by fatigue;Patient limited by lethargy Patient left: with chair alarm set;in chair;with family/visitor present;with call bell/phone within reach Nurse Communication: Mobility status PT Visit Diagnosis: Unsteadiness on feet (R26.81);Muscle weakness (generalized) (M62.81);History of falling (Z91.81)     Time: 4128-7867 PT Time Calculation (min) (ACUTE ONLY): 30 min  Charges:  $Therapeutic Exercise: 23-37 mins                     07/09/2019   Rande Lawman, PT    Loyal Buba 07/09/2019, 12:42 PM

## 2019-07-09 NOTE — Progress Notes (Signed)
  Subjective:  Daughter at bedside and reports continued confusion with moments of clarity. Appears slightly improved since yesterday. Patient asked for ice cream and coke yesterday but refused food when it arrived. Daughter reports patient has trouble swallowing pills. States patient's last meal was on Thursday (7/23). No acute complaints.  Patient denies pain or shortness of breath.  Objective:   Vital Signs (last 24 hours): Vitals:   07/08/19 2158 07/08/19 2221 07/09/19 0024 07/09/19 0413  BP:  121/66 139/69 132/65  Pulse:  97 100 90  Resp:  (!) 29 (!) 24 20  Temp:   98.2 F (36.8 C) 98.1 F (36.7 C)  TempSrc:   Axillary Axillary  SpO2: 97% 96% 93% 93%  Weight:      Height:       Physical Exam: General Awake, no acute distress  Cardiac Regular rate and rhythm, no murmurs, rubs, or gallops  Pulmonary Clear to auscultation bilaterally without wheezes, rhonchi, or rales  Abdominal Soft, non-tender, without distention  Neuro Squinting left eye. Pupils equal and react to light. Visually acuity grossly intact - can read words with each eye independently  Extremities No peripheral edema    Assessment/Plan:   Principal Problem:   Fever Active Problems:   AKI (acute kidney injury) ([place])   Hypercalcemia   Hypomagnesemia, chronic   PRES (posterior reversible encephalopathy syndrome)  Rhonda Hale is a 73 year old female with past medical history significant for ovarian cancer status post hysterectomy and splenectomy, on chemotherapy, presented to the ED following 2-day history of fever, chills, and elevated temperature to 103 Fahrenheit. Patient without significant febrile episode over past 3 days.  # Fever with AMS: .  Blood cultures show NGTD.  UA within normal limits and urine culture shows no growth.  CT scan and MRI brain do not reveal source for infection.  * LP yesterday without evidence for infection. Awaiting HSV, VZV, arbovirus but unlikely * ID following, appreciate their  continued recommendations  # PRESS: Blood pressure well controlled on current regimen with systolics ranging 024-097. Increased BP thought to be 2/2 bevacizumab.  * Continue amlodipine 10mg  QD and labetalol 100mg  TID  # AKI: Creatinine of 2.63, up from 1.09 yesterday. On maintenance fluids so low suspicion for prerenal. Unlikely to be post-renal - bladder scan showed 53 mL. Stone on CT but followup renal US shows no evidence for obstruction.  * UA with microscopy to assess for ATN. Considering nephrotoxic ATN - no high risk medications however acyclovir can rarely be nephrotoxic - discontinuing as no further utility for it per ID. Also considering ischemic etiology given baseline elevated BP and recent initiation of strict BP management due to PRESS.  * Will repeat bladder scan  # Hypercalcemia: Elevated to 11.4 on 7/22, likely secondary to overrepletion. Improved with fluids. Corrected calcium of 8.1.  # Anemia: Iron studies consistent with anemia of chronic disease. Hemoglobin stable at 7.6  Dispo: Anticipated discharge pending clinical improvement.   Jeanmarie Hubert, MD 07/09/2019, 8:50 AM Pager: [phone]

## 2019-07-09 NOTE — Progress Notes (Signed)
ANTICOAGULATION CONSULT NOTE - Initial Consult  Pharmacy Consult for Heparin Indication: pulmonary embolus  Allergies  Allergen Reactions  . Atorvastatin Other (See Comments)    Severe heartburn, reflux    Patient Measurements: Height: 5\' 2"  (157.5 cm) Weight: 173 lb (78.5 kg) IBW/kg (Calculated) : 50.1 Heparin Dosing Weight: 67.4 kg  Vital Signs: BP: 109/56 (07/28 1505) Pulse Rate: 76 (07/28 1505)  Labs: Recent Labs    07/08/19 0327 07/09/19 0429 07/09/19 1501  HGB 7.1* 7.6* 7.3*  HCT 21.5* 23.3* 22.7*  PLT 346 385 370  CREATININE 1.09* 2.63* 3.22*  TROPONINIHS  --   --  18*    Estimated Creatinine Clearance: 14.7 mL/min (A) (by C-G formula based on SCr of 3.22 mg/dL (H)).  Assessment: 73 yr old female with hx of PE, on Xarelto 20 mg/day at home (last dose of Xarelto while inpt was on 07/06/19, in prep for LP). Pharmacy is consulted to dose heparin (pt currently NPO).  Of note, pt has AKI, with Scr trending up (latest Scr 3.22, CrCl 14.7 mL/min). Other medical hx includes: ovarian cancer (S/P hysterectomy, splenectomy, on chemo), fever with AMS, PRESS, hypercalcemia, anemia.  Goal of Therapy:  Heparin level 0.3-0.7 units/ml Monitor platelets by anticoagulation protocol: Yes   Plan:  Start heparin infusion at 1100 units/hr (no bolus) Check baseline aPTT and heparin level prior to initiating infusion to determine any residual effect of Xarelto (due to AKI, Xarelto renal clearance is reduced) Check heparin level 8 hrs after initiating infusion and daily Check CBC daily Monitor for signs/symptoms of bleeding  Gillermina Hu, PharmD, BCPS, [facility] Clinical Pharmacist 07/09/2019,5:08 PM

## 2019-07-09 NOTE — Progress Notes (Signed)
At approximately 1340, got notified by RN that patient was unresponsive on bedside commode.  Went to evaluate patient. Patient was not responsive to sternal rub, vitals were normal. Patient moved back to bed and was again not responsive to sternal rub. Pupils were equal and reactive to light, vitals WNL.  Stat EKG, head CT, and labs were ordered including troponin, CMP, CBC, magnesium.  EKG without ST elevation or T wave abnormality. There were Q waves in V1/V2 which may represent acute vs. old infarct. Calcium gluconate 1 g, 10 units novolog with 50 mL D5, and 4 g magnesium sulfate administered. Patient aroused to gag and responded to questions. Patient denied chest pain. She endorsed feeling lightheaded, but could not recount events leading up to syncope. Patient has difficulty forming sentences but this is her baseline.  Physical Exam: Pulses strong bilaterally, extremities warm, well perfused without edema. Breathing comfortably on 2L (on previously). Moves all extremities purposefully and spontaneously. Withdraws to pain. Stomach soft, nontender, nondistended.  Plan: * Followup: Head CT, troponin, CMP, CBC, magnesium  Jeanmarie Hubert, MD 07/09/2019, 2:37 PM Pager: [phone]

## 2019-07-09 NOTE — Progress Notes (Signed)
SLP Cancellation Note  Patient Details Name: Rhonda Hale MRN: [id number] DOB: 1946/02/19   Cancelled treatment:       Reason Eval/Treat Not Completed: Other (comment) Pt with change in status this afternoon, now returned to baseline per RN but with MD in room. Will f/u as able.   Rhonda Hale Rhonda Hale 07/09/2019, 3:29 PM   Rhonda Hale, M.A. [place] Acute Environmental education officer [phone] Office [phone]

## 2019-07-10 ENCOUNTER — Inpatient Hospital Stay (HOSPITAL_COMMUNITY): Payer: Medicare Other

## 2019-07-10 DIAGNOSIS — J9601 Acute respiratory failure with hypoxia: Secondary | ICD-10-CM

## 2019-07-10 DIAGNOSIS — A419 Sepsis, unspecified organism: Secondary | ICD-10-CM

## 2019-07-10 DIAGNOSIS — R6520 Severe sepsis without septic shock: Secondary | ICD-10-CM

## 2019-07-10 DIAGNOSIS — R34 Anuria and oliguria: Secondary | ICD-10-CM

## 2019-07-10 LAB — URINALYSIS, ROUTINE W REFLEX MICROSCOPIC
Bilirubin Urine: NEGATIVE
Glucose, UA: NEGATIVE mg/dL
Ketones, ur: NEGATIVE mg/dL
Nitrite: NEGATIVE
Protein, ur: 30 mg/dL — AB
Specific Gravity, Urine: 1.013 (ref 1.005–1.030)
pH: 5 (ref 5.0–8.0)

## 2019-07-10 LAB — COMPREHENSIVE METABOLIC PANEL
ALT: 19 U/L (ref 0–44)
ALT: 18 U/L (ref 0–44)
AST: 26 U/L (ref 15–41)
AST: 25 U/L (ref 15–41)
Albumin: 2.1 g/dL — ABNORMAL LOW (ref 3.5–5.0)
Albumin: 2.1 g/dL — ABNORMAL LOW (ref 3.5–5.0)
Alkaline Phosphatase: 70 U/L (ref 38–126)
Alkaline Phosphatase: 64 U/L (ref 38–126)
Anion gap: 9 (ref 5–15)
Anion gap: 11 (ref 5–15)
BUN: 18 mg/dL (ref 8–23)
BUN: 18 mg/dL (ref 8–23)
CO2: 19 mmol/L — ABNORMAL LOW (ref 22–32)
CO2: 18 mmol/L — ABNORMAL LOW (ref 22–32)
Calcium: 7.7 mg/dL — ABNORMAL LOW (ref 8.9–10.3)
Calcium: 7.5 mg/dL — ABNORMAL LOW (ref 8.9–10.3)
Chloride: 105 mmol/L (ref 98–111)
Chloride: 105 mmol/L (ref 98–111)
Creatinine, Ser: 3.59 mg/dL — ABNORMAL HIGH (ref 0.44–1.00)
Creatinine, Ser: 3.38 mg/dL — ABNORMAL HIGH (ref 0.44–1.00)
GFR calc Af Amer: 14 mL/min — ABNORMAL LOW (ref >60)
GFR calc Af Amer: 15 mL/min — ABNORMAL LOW (ref >60)
GFR calc non Af Amer: 12 mL/min — ABNORMAL LOW (ref >60)
GFR calc non Af Amer: 13 mL/min — ABNORMAL LOW (ref >60)
Glucose, Bld: 124 mg/dL — ABNORMAL HIGH (ref 70–99)
Glucose, Bld: 118 mg/dL — ABNORMAL HIGH (ref 70–99)
Potassium: 3.9 mmol/L (ref 3.5–5.1)
Potassium: 3.6 mmol/L (ref 3.5–5.1)
Sodium: 133 mmol/L — ABNORMAL LOW (ref 135–145)
Sodium: 134 mmol/L — ABNORMAL LOW (ref 135–145)
Total Bilirubin: 0.3 mg/dL (ref 0.3–1.2)
Total Bilirubin: 0.5 mg/dL (ref 0.3–1.2)
Total Protein: 5.3 g/dL — ABNORMAL LOW (ref 6.5–8.1)
Total Protein: 5.4 g/dL — ABNORMAL LOW (ref 6.5–8.1)

## 2019-07-10 LAB — CBC WITH DIFFERENTIAL/PLATELET
Abs Immature Granulocytes: 0 10*3/uL (ref 0.00–0.07)
Band Neutrophils: 2 %
Basophils Absolute: 0 10*3/uL (ref 0.0–0.1)
Basophils Relative: 0 %
Eosinophils Absolute: 0 10*3/uL (ref 0.0–0.5)
Eosinophils Relative: 0 %
HCT: 22 % — ABNORMAL LOW (ref 36.0–46.0)
Hemoglobin: 7.2 g/dL — ABNORMAL LOW (ref 12.0–15.0)
Lymphocytes Relative: 15 %
Lymphs Abs: 2 10*3/uL (ref 0.7–4.0)
MCH: 32.3 pg (ref 26.0–34.0)
MCHC: 32.7 g/dL (ref 30.0–36.0)
MCV: 98.7 fL (ref 80.0–100.0)
Monocytes Absolute: 1 10*3/uL (ref 0.1–1.0)
Monocytes Relative: 8 %
Neutro Abs: 10.1 10*3/uL — ABNORMAL HIGH (ref 1.7–7.7)
Neutrophils Relative %: 75 %
Platelets: 400 10*3/uL (ref 150–400)
RBC: 2.23 MIL/uL — ABNORMAL LOW (ref 3.87–5.11)
RDW: 21.1 % — ABNORMAL HIGH (ref 11.5–15.5)
WBC: 13.1 10*3/uL — ABNORMAL HIGH (ref 4.0–10.5)
nRBC: 11.9 % — ABNORMAL HIGH (ref 0.0–0.2)
nRBC: 15 /100 WBC — ABNORMAL HIGH

## 2019-07-10 LAB — HEPARIN LEVEL (UNFRACTIONATED)
Heparin Unfractionated: 0.36 IU/mL (ref 0.30–0.70)
Heparin Unfractionated: 0.49 IU/mL (ref 0.30–0.70)

## 2019-07-10 LAB — BASIC METABOLIC PANEL
Anion gap: 10 (ref 5–15)
BUN: 18 mg/dL (ref 8–23)
CO2: 18 mmol/L — ABNORMAL LOW (ref 22–32)
Calcium: 7.2 mg/dL — ABNORMAL LOW (ref 8.9–10.3)
Chloride: 106 mmol/L (ref 98–111)
Creatinine, Ser: 3.11 mg/dL — ABNORMAL HIGH (ref 0.44–1.00)
GFR calc Af Amer: 16 mL/min — ABNORMAL LOW (ref >60)
GFR calc non Af Amer: 14 mL/min — ABNORMAL LOW (ref >60)
Glucose, Bld: 148 mg/dL — ABNORMAL HIGH (ref 70–99)
Potassium: 3.3 mmol/L — ABNORMAL LOW (ref 3.5–5.1)
Sodium: 134 mmol/L — ABNORMAL LOW (ref 135–145)

## 2019-07-10 LAB — HERPES SIMPLEX VIRUS(HSV) DNA BY PCR: HSV 1 DNA: NEGATIVE

## 2019-07-10 LAB — CULTURE, BLOOD (ROUTINE X 2)
Culture: NO GROWTH
Culture: NO GROWTH
Special Requests: ADEQUATE
Special Requests: ADEQUATE

## 2019-07-10 LAB — HSV DNA BY PCR (REFERENCE LAB): HSV 2 DNA: NEGATIVE

## 2019-07-10 LAB — TYPE AND SCREEN
ABO/RH(D): A POS
Antibody Screen: NEGATIVE

## 2019-07-10 LAB — ABO/RH: ABO/RH(D): A POS

## 2019-07-10 LAB — CREATININE, URINE, RANDOM: Creatinine, Urine: 104.6 mg/dL

## 2019-07-10 LAB — BRAIN NATRIURETIC PEPTIDE: B Natriuretic Peptide: 270.1 pg/mL — ABNORMAL HIGH (ref 0.0–100.0)

## 2019-07-10 LAB — SODIUM, URINE, RANDOM: Sodium, Ur: 58 mmol/L

## 2019-07-10 LAB — PATHOLOGIST SMEAR REVIEW: Path Review: INCREASED

## 2019-07-10 LAB — VDRL, CSF: VDRL Quant, CSF: NONREACTIVE

## 2019-07-10 LAB — VARICELLA-ZOSTER BY PCR: Varicella-Zoster, PCR: NEGATIVE

## 2019-07-10 LAB — MAGNESIUM: Magnesium: 3.6 mg/dL — ABNORMAL HIGH (ref 1.7–2.4)

## 2019-07-10 MED ORDER — SODIUM CHLORIDE 0.9 % IV SOLN
2.0000 g | INTRAVENOUS | Status: DC
  Administered 2019-07-10 – 2019-07-13 (×4): 2 g via INTRAVENOUS
  Filled 2019-07-10 (×5): qty 2

## 2019-07-10 MED ORDER — LINEZOLID 600 MG/300ML IV SOLN
600.0000 mg | Freq: Two times a day (BID) | INTRAVENOUS | Status: DC
  Administered 2019-07-10 – 2019-07-11 (×3): 600 mg via INTRAVENOUS
  Filled 2019-07-10 (×4): qty 300

## 2019-07-10 MED ORDER — SODIUM CHLORIDE 0.9 % IV SOLN
510.0000 mg | Freq: Once | INTRAVENOUS | Status: AC
  Administered 2019-07-10: 510 mg via INTRAVENOUS
  Filled 2019-07-10: qty 17

## 2019-07-10 MED ORDER — LACTATED RINGERS IV SOLN
INTRAVENOUS | Status: DC
  Administered 2019-07-10 – 2019-07-11 (×2): via INTRAVENOUS

## 2019-07-10 NOTE — Progress Notes (Addendum)
ANTICOAGULATION CONSULT NOTE - [place] for Heparin Indication: pulmonary embolus  Allergies  Allergen Reactions  . Atorvastatin Other (See Comments)    Severe heartburn, reflux    Patient Measurements: Height: 5\' 2"  (157.5 cm) Weight: 173 lb (78.5 kg) IBW/kg (Calculated) : 50.1 Heparin Dosing Weight: 67.4 kg  Vital Signs: Temp: 94.2 F (34.6 C) (07/29 0052) Temp Source: Rectal (07/29 0022) BP: 116/64 (07/29 0052) Pulse Rate: 74 (07/29 0052)  Labs: Recent Labs    07/08/19 0327 07/09/19 0429 07/09/19 1501 07/09/19 1831 07/10/19 0022  HGB 7.1* 7.6* 7.3*  --   --   HCT 21.5* 23.3* 22.7*  --   --   PLT 346 385 370  --   --   APTT  --   --   --  44*  --   HEPARINUNFRC  --   --   --  <0.10* 0.36  CREATININE 1.09* 2.63* 3.22*  --   --   TROPONINIHS  --   --  18*  --   --     Estimated Creatinine Clearance: 14.7 mL/min (A) (by C-G formula based on SCr of 3.22 mg/dL (H)).  Assessment: 73 yr old female with hx of PE, on Xarelto 20 mg/day at home (last dose of Xarelto while inpt was on 07/06/19, in prep for LP). Pharmacy is consulted to dose heparin (pt currently NPO).  Of note, pt has AKI, with Scr trending up (latest Scr 3.22, CrCl 14.7 mL/min). Other medical hx includes: ovarian cancer (S/P hysterectomy, splenectomy, on chemo), fever with AMS, PRESS, hypercalcemia, anemia.  Initial heparin level drawn earlier but therapeutic (baseline wnl so no need for aPTT).  Goal of Therapy:  Heparin level 0.3-0.7 units/ml Monitor platelets by anticoagulation protocol: Yes   Plan:  -Continue heparin 1100 units/hr -Recheck heparin level in 8hr to confirm    Arrie Senate, PharmD, BCPS Clinical Pharmacist Please check AMION for all [place] numbers 07/10/2019    Heparin level therapeutic x 2  Thank you Anette Guarneri, PharmD

## 2019-07-10 NOTE — Evaluation (Signed)
Clinical/Bedside Swallow Evaluation Patient Details  Name: Rhonda Hale MRN: [id number] Date of Birth: May 10, 1946  Today's Date: 07/10/2019 Time: SLP Start Time (ACUTE ONLY): 0945 SLP Stop Time (ACUTE ONLY): 1010 SLP Time Calculation (min) (ACUTE ONLY): 25 min  Past Medical History:  Past Medical History:  Diagnosis Date  . Cancer [facility])    Past Surgical History:  HPI:  Patient is a 73 y.o. female with PMH: metastatic ovarian cancer on treatment with Paclitaxel and Avastin, who presented to the ED with c/o fever with two day h/o decreased appetite, decreased oral intake, fatigue, chills and fever. Her temperature was reportedly 103 in AM, taken by her daughter (who is an Therapist, sports). She has baseline intermittent cough and rhinorrhea. Chest xray on 7/27 showed mild patchy opacities in lungs bilaterally, concerning for mild PNA, but CXR on 7/29 reported some degree of vascular congestion. On 7/28, patient's temperature dropped to 91 and she required a heated blanket to return to normal.   Assessment / Plan / Recommendation Clinical Impression  Patient presents with a mild oral dyspahgia which is likely secondary to symptoms from cancer medications, as she has dry oral mucosa, dry secretions coating tongue, with mild laceration in middle, anterior portion of tongue. She did not have excessive secretions in oral cavity. She did not exhibit any overt s/s of aspiraiton or penetration with straw sips of thin liquids, but did state that she felt mild discomfort in throat. Swallow initiation, laryngeal elevation and pharyngeal contraction were all WFL. No other consistencies were attempted as patient became SOB and fatigued with sips of water and SLP is recommending frequent oral care, sips of water, ice chips, but to hold off on solid PO's until her oral cavity is adequately clean and moist. Patient and daughter both in agreement as patient has not had a good appetite anyway, and is tired today lack of adequate  sleep. SLP Visit Diagnosis: Dysphagia, unspecified (R13.10)    Aspiration Risk  Mild aspiration risk    Diet Recommendation Thin liquid   Liquid Administration via: Cup;Straw Medication Administration: Whole meds with puree Supervision: Staff to assist with self feeding;Full supervision/cueing for compensatory strategies Compensations: Minimize environmental distractions;Slow rate;Small sips/bites Postural Changes: Seated upright at 90 degrees    Other  Recommendations Oral Care Recommendations: Oral care BID;Oral care QID   Follow up Recommendations None      Frequency and Duration min 2x/week  1 week       Prognosis Prognosis for Safe Diet Advancement: Good      Swallow Study   General Date of Onset: 07/03/19 HPI: Patient is a 73 y.o. female with PMH: metastatic ovarian cancer on treatment with Paclitaxel and Avastin, who presented to the ED with c/o fever with two day h/o decreased appetite, decreased oral intake, fatigue, chills and fever. Her temperature was reportedly 103 in AM, taken by her daughter (who is an Therapist, sports). She has baseline intermittent cough and rhinorrhea. Chest xray on 7/27 showed mild patchy opacities in lungs bilaterally, concerning for mild PNA, but CXR on 7/29 reported some degree of vascular congestion. On 7/28, patient's temperature dropped to 91 and she required a heated blanket to return to normal. Type of Study: Bedside Swallow Evaluation Previous Swallow Assessment: N/A Diet Prior to this Study: Regular;Thin liquids Temperature Spikes Noted: No Respiratory Status: Nasal cannula History of Recent Intubation: No Behavior/Cognition: Alert;Cooperative;Confused;Pleasant mood;Requires cueing;Lethargic/Drowsy Oral Cavity Assessment: Within Functional Limits Oral Care Completed by SLP: Yes Oral Cavity - Dentition: Adequate natural dentition Self-Feeding Abilities:  Needs assist;Needs set up Patient Positioning: Upright in bed Baseline Vocal Quality: Low  vocal intensity Volitional Cough: Weak Volitional Swallow: Able to elicit    Oral/Motor/Sensory Function Overall Oral Motor/Sensory Function: Within functional limits   Ice Chips Ice chips: Not tested   Thin Liquid Thin Liquid: Within functional limits Presentation: Straw Other Comments: no overt s/s of aspiration or penetration    Nectar Thick Nectar Thick Liquid: Not tested   Honey Thick Honey Thick Liquid: Not tested   Puree Puree: Not tested   Solid     Solid: Not tested      Rhonda Hale 07/10/2019,1:51 PM   [name], MA, CCC-SLP Speech Therapy [facility] Acute Rehab Pager: [phone]

## 2019-07-10 NOTE — Progress Notes (Signed)
Temperature is 98.4 rectal.  Patient is awake and talking .  She is alert and oriented to self.  She was re-oriented to place, time, and situation. Warming blanket is removed.  Daughter is at bedside.  Will continue to monitor.

## 2019-07-10 NOTE — Progress Notes (Signed)
Pharmacy Antibiotic Note  Rhonda Hale is a 73 y.o. female admitted on 07/03/2019 with fever with AMS.  LP without evidence of infection, Chest xray with possible pneumonia, previously on Azithromycin and Ceftriaxone, now broadening coverage to Cefepime and Zyvox with ongoing fever.  With a history significant for ovarian cancer s/p hysterectomy and splenectomy, receiving chemotherapy  Recently developed an AKI, Scr up to 3.38 Cultures negative to date  Plan: Zyvox 600 mg iv Q 12 hours Cefepime 2 grams iv Q 24 hours Continue to follow  Height: 5\' 2"  (157.5 cm) Weight: 176 lb 2.4 oz (79.9 kg)(without extra blankets) IBW/kg (Calculated) : 50.1  Temp (24hrs), Avg:96 F (35.6 C), Min:91.7 F (33.2 C), Max:100.3 F (37.9 C)  Recent Labs  Lab 07/03/19 0938  07/07/19 0329 07/08/19 0327 07/09/19 0429 07/09/19 1501 07/10/19 0022 07/10/19 0452  WBC 5.4   < > 9.8 9.5 10.6* 10.7*  --  13.1*  CREATININE 1.23*   < > 1.05* 1.09* 2.63* 3.22* 3.59* 3.38*  LATICACIDVEN 1.7  --   --   --   --   --   --   --    < > = values in this interval not displayed.    Estimated Creatinine Clearance: 14.1 mL/min (A) (by C-G formula based on SCr of 3.38 mg/dL (H)).    Allergies  Allergen Reactions  . Atorvastatin Other (See Comments)    Severe heartburn, reflux     Thank you for allowing pharmacy to be a part of this patient's care. Anette Guarneri, PharmD 07/10/2019 9:34 AM

## 2019-07-10 NOTE — Progress Notes (Signed)
OT Cancellation Note  Patient Details Name: Rhonda Hale MRN: [id number] DOB: [birth year]   Cancelled Treatment:    Reason Eval/Treat Not Completed: Patient declined, no reason specified   Patient's daughter present and providing brief history of events over last 24 hrs.  Pt's daughter reporting "we are conserving our energy today" requesting that therapy return tomorrow.  Patient's daughter reports pt "wanting" to do everything but is exhausted due to medical events.  Will continue to follow per POC.  Simonne Come 07/10/2019, 1:56 PM

## 2019-07-10 NOTE — Progress Notes (Signed)
  Date: 07/10/2019  Patient name: Rhonda Hale  Medical record number: [id number]  Date of birth: 1946/07/07   I have seen and evaluated this patient and I have discussed the plan of care with the house staff. Please see their note for complete details. I concur with their findings with the following additions/corrections: [name] was seen this morning on team rounds.  I have reviewed Dr. Posey Pronto consult note and Dr. Webb Silversmith progress note.  1. PRES - her risk factor was use of bevacizumab.  Her mental status is improving.  We are controlling her blood pressure with amlodipine and labetalol.  She has not had any known seizure activity.  2.  Severe sepsis - she had an infectious work-up on admission that was unrevealing.  Subsequently, her fever defervesced but then returned on the 26.  A chest x-ray showed possible multifocal pneumonia and she was started on azithromycin and Rocephin on the 28.  Overnight, she became hypothermic which could indicate sepsis so we have broadened her antibiotics to cefepime and linezolid along with her azithromycin to cover Pseudomonas, diabetes, antiarrhythmics, and atypicals.  We are reculturing blood and urine and repeating her chest x-ray.  I independently viewed her chest x-ray which shows a 1 view AP portable upright and slight improvement from the x-ray on the 27th.  The official reading was changes suggesting mild vascular congestion.  We will continue her antibiotics and follow-up culture results.  3.  Acute hypoxic respiratory failure - our differential this morning was pneumonia versus pulmonary edema.  Of BNP in the setting of renal failure of 270 essentially ruled out volume overload.  Additionally, Dr. Posey Pronto did not feel she was volume overloaded.  Therefore, her acute hypoxic respiratory failure is likely secondary to pneumonia.  PE is highly unlikely as she came in on oral anticoagulation and has been on anticoagulation except for prior to her LP.  If  studies are unrevealing and she did not improve, I would favor a CT of the lungs without contrast.  3.  Oliguric renal failure - her creatinine started to trend up on the 28th and acyclovir was stopped.  The evening of the 28th, she was hypoxic and subsequently her creatinine trended further up.  Appreciate Dr. Serita Grit insight.  He recommended IV fluids with LR, no need for HD currently, avoid further insults, and try to find the source of the infection.  Billing notes - [name] continues to have acute on chronic illnesses that are definitely posing a threat to her life and bodily function.  These include hypoxic respiratory failure, severe sepsis, and oliguric renal failure.  Bartholomew Crews, MD 07/10/2019, 2:51 PM

## 2019-07-10 NOTE — Plan of Care (Signed)

## 2019-07-10 NOTE — Progress Notes (Signed)
Student Pharmacist rounding with the IMTS-B1 service was consulted on antibiotic management for a patient. The patient is a 73 year old lady on day 7 of hospital admission for posterior reversible encephalopathy syndrome. She had been managed with azithromycin IV 500mg  every 24 hours and ceftriaxone 2g every 24 hours for community-acquired pneumonia. Given the worsening of the patient's clinical symptoms, the internal medicine team wanted to broaden antibiotics for coverage of MRSA, Pseudomonas, Atypical, and Gram-negative organisms.   I have reviewed the patient's profile and found the creatinine clearance listed as 14.1 mL/min. After a discussion with the internal medicine team, she was started on a regimen consisting of linezolid 600mg  IV every 12 hours, cefepime 2g every 24 hours, and azithromycin 500mg  IV every 24 hours. The linezolid should provide coverage of MRSA, cefepime would cover Pseudomonas and Gram-negative organisms, and azithromycin would cover atypicals. Continue to follow culture data, renal function, medication tolerance, and clinical improvement for de-escalation of therapy.  Youlanda Roys, 4th year Stage manager

## 2019-07-10 NOTE — Consult Note (Addendum)
Reason for Consult: Acute kidney injury on chronic kidney disease stage III Referring Physician: Larey Dresser, MD (IMTS)  HPI:  73 year old Caucasian man with past medical history significant for metastatic ovarian cancer on treatment with paclitaxel and Avastin who was brought to the emergency room with complaints of fever with decreased appetite/decreased oral intake and generalized malaise.  She was presumptively treated for pneumonia and has been evaluated for possible meningitis and encephalitis given her relatively immunosuppressed state.  She denies any history of chronic kidney disease, prior history of acute kidney injury or symptomatic nephrolithiasis/recurrent UTI.  Review of labs shows baseline creatinine of 1.1-1.3 (CKD stage III).  Concern was raised 2 days ago when her creatinine abruptly rose from 1.1-2.6 following increased fever/chills with transient hypotension with blood pressure as low as 77/47 prompting discontinuation of her ACE inhibitor.  She has not had any iodinated intravenous contrast or NSAID use (including as an outpatient).  She was on empiric treatment acyclovir until 07/09/2019.  She denies any dysuria, urgency, frequency, flank pain or hematuria leading up to her hospitalization.  Past Medical History:  Diagnosis Date  . Cancer [facility])     History reviewed. No pertinent family history.  Social History:  reports that she has never smoked. She has never used smokeless tobacco. She reports previous alcohol use. She reports that she does not use drugs.  Allergies:  Allergies  Allergen Reactions  . Atorvastatin Other (See Comments)    Severe heartburn, reflux    Medications:  Scheduled: . amLODipine  10 mg Oral Daily  . calcium-vitamin D  1 tablet Oral BID  . escitalopram  20 mg Oral Daily  . feeding supplement  1 Container Oral TID BM  . feeding supplement (PRO-STAT SUGAR FREE 64)  30 mL Oral BID WC  . levothyroxine  137 mcg Oral QAC breakfast  .  multivitamin with minerals  1 tablet Oral Daily  . sodium chloride flush  3 mL Intravenous Q12H    BMP Latest Ref Rng & Units 07/10/2019 07/10/2019 07/09/2019  Glucose 70 - 99 mg/dL 118(H) 124(H) 240(H)  BUN 8 - 23 mg/dL 18 18 15   Creatinine 0.44 - 1.00 mg/dL 3.38(H) 3.59(H) 3.22(H)  Sodium 135 - 145 mmol/L 134(L) 133(L) 132(L)  Potassium 3.5 - 5.1 mmol/L 3.6 3.9 3.8  Chloride 98 - 111 mmol/L 105 105 103  CO2 22 - 32 mmol/L 18(L) 19(L) 18(L)  Calcium 8.9 - 10.3 mg/dL 7.5(L) 7.7(L) 6.8(L)   CBC Latest Ref Rng & Units 07/10/2019 07/09/2019 07/09/2019  WBC 4.0 - 10.5 K/uL 13.1(H) 10.7(H) 10.6(H)  Hemoglobin 12.0 - 15.0 g/dL 7.2(L) 7.3(L) 7.6(L)  Hematocrit 36.0 - 46.0 % 22.0(L) 22.7(L) 23.3(L)  Platelets 150 - 400 K/uL 400 370 385     Dg Chest 1 View  Result Date: 07/08/2019 CLINICAL DATA:  Respiratory distress, fever, weakness, shortness of breath EXAM: CHEST  1 VIEW COMPARISON:  07/05/2019 FINDINGS: Increased interstitial markings with mild patchy opacities in the lungs bilaterally, raising concern for mild pneumonia. No pleural effusions or pneumothorax. The heart is normal in size. Right chest port terminates the cavoatrial junction. IMPRESSION: Suspected mild multifocal pneumonia. Electronically Signed   By: Julian Hy M.D.   On: 07/08/2019 22:14   Ct Head Wo Contrast  Result Date: 07/09/2019 CLINICAL DATA:  Altered mental status with syncope and lethargy EXAM: CT HEAD WITHOUT CONTRAST TECHNIQUE: Contiguous axial images were obtained from the base of the skull through the vertex without intravenous contrast. COMPARISON:  None. FINDINGS: Brain:  There is mild diffuse atrophy. There is no intracranial mass, hemorrhage, extra-axial fluid collection, or midline shift. There is relatively mild patchy small vessel disease in the centra semiovale bilaterally. No acute infarct evident. Vascular: No evident hyperdense vessel. There is calcification in each carotid siphon region. Skull: The bony  calvarium appears intact. Sinuses/Orbits: There is slight mucosal thickening in several ethmoid air cells. Orbits appear symmetric bilaterally. Patient has had cataract removals bilaterally. Other: Mastoid air cells are clear. IMPRESSION: Mild atrophy with mild patchy periventricular small vessel disease. No acute infarct. No mass or hemorrhage. There are foci of arterial vascular calcification. There is mucosal thickening in several ethmoid air cells. Electronically Signed   By: Lowella Grip III M.D.   On: 07/09/2019 15:09   US Renal  Result Date: 07/09/2019 CLINICAL DATA:  Acute kidney injury. EXAM: RENAL / URINARY TRACT ULTRASOUND COMPLETE COMPARISON:  Renal ultrasound 07/04/2019. CT abdomen and pelvis 07/05/2019. FINDINGS: Right Kidney: Renal measurements: 10.2 x 5.1 x 4.5 cm = volume: 122.4 mL . Echogenicity within normal limits. No mass or hydronephrosis visualized. 1.2 cm nonobstructing stone lower pole noted. Left Kidney: Renal measurements: 10.0 x 6.3 x 4.7 cm = volume: 154.2. mL. Echogenicity within normal limits. No mass or hydronephrosis visualized. Bladder: Appears normal for degree of bladder distention. IMPRESSION: Negative for hydronephrosis.  No acute abnormality. Nonobstructing stone lower pole right kidney as seen on the prior exams. Electronically Signed   By: Inge Rise M.D.   On: 07/09/2019 10:17   Dg Fl Guided Lumbar Puncture  Result Date: 07/08/2019 Karie Mainland, DO     07/08/2019  3:55 PM Indication: Altered mental status, fever. Procedure: A fluoroscopic guided lumbar puncture was performed under local anesthesia. A 20 gauge needle was inserted at the L2-3 level and 9 mL of clear CSF was collected into four tubes which were labelled and sent to pathology for further analysis. The patient tolerated the procedure well with no immediate post procedure complication. Impression: Technically successful fluoroscopic guided lumbar puncture. Davina Poke, DO  Radiology   Review of Systems  Constitutional: Positive for chills, fever and malaise/fatigue. Negative for weight loss.  HENT: Negative.   Respiratory: Negative for cough, sputum production and shortness of breath.   Cardiovascular: Negative.        Dry mouth and some difficulty swallowing  Gastrointestinal: Positive for nausea. Negative for abdominal pain, diarrhea and vomiting.  Genitourinary: Negative.   Musculoskeletal: Negative.   Skin: Positive for rash.       Usually develops blotchy skin rash over arms from Taxol  Neurological: Positive for weakness. Negative for dizziness and headaches.   Blood pressure 133/66, pulse 96, temperature 100.3 F (37.9 C), temperature source Rectal, resp. rate 18, height 5\' 2"  (1.575 m), weight 79.9 kg, SpO2 95 %. Physical Exam  Nursing note and vitals reviewed. Constitutional: She is oriented to person, place, and time. She appears well-developed and well-nourished. No distress.  HENT:  Head: Normocephalic.  Nose: Nose normal.  Mouth/Throat: No oropharyngeal exudate.  Alopecic with dry oral mucosa  Eyes: Pupils are equal, round, and reactive to light. Conjunctivae and EOM are normal. No scleral icterus.  Neck: Normal range of motion. Neck supple. No JVD present.  Cardiovascular: Normal rate, regular rhythm and normal heart sounds.  No murmur heard. Respiratory: Effort normal and breath sounds normal. She has no wheezes. She has no rales.  GI: Bowel sounds are normal. There is no abdominal tenderness. There is no rebound and no guarding.  Musculoskeletal:        General: No edema.  Neurological: She is alert and oriented to person, place, and time.  Appears somnolent but awakens easily to conversation and responds appropriately  Skin: Skin is warm and dry. No erythema.  Psychiatric: She has a normal mood and affect.    Assessment/Plan: 1.  Acute kidney injury on chronic kidney disease stage III: Based on the history and timeline of  events, I suspect that she has suffered ischemic ATN (FENa 1.5%) with her transient hypotension that was most prominent on 07/09/2019 when blood pressure dropped to as low as 77/47.  Cannot entirely rule out possible nephrotoxicity from acyclovir.  She is oliguric but without any acute electrolyte abnormality, significant volume overload or uremic signs or symptoms prompting indication for dialysis, with her limited oral intake I agree with maintenance IV fluids-we will switch to LR to help manage mild metabolic acidosis.  Renal ultrasound done yesterday was negative for hydronephrosis.  I recommend continued supportive management of blood pressure/source identification of fever along with avoidance of nonsteroidal anti-inflammatory drugs, iodinated intravenous contrast and hypotension.  Medications reviewed to verify renal dosing.  The big concern at this time is she appears to be severely oliguric and at high risk for further decompensation which may raise indications for dialysis.  I had a brief discussion with her and her daughter regarding this difficult juxtaposition especially with her overall burden of comorbidities. 2.  Hyponatremia: Secondary to free water excretion defect with acute kidney injury with ongoing half-normal saline.  Oral fluids restricted by her poor appetite. 3.  Anion gap metabolic acidosis: With mild anion gap when corrected for hypoalbuminemia.  This is consistent with acute kidney injury-switch fluids to lactated Ringers. 4.  Fever/encephalopathy: Treated empirically for pneumonia and ongoing evaluation for viral meningitis/encephalitis and currently on broad-spectrum antimicrobial therapy with cefepime, azithromycin and linezolid.  MRI of the brain done 3 days ago shows features of posterior reversible encephalopathy syndrome. 5.  Metastatic ovarian cancer: Recently on treatment with paclitaxel and Avastin. 6.  Anemia of chronic illness/malignancy: Iron stores low and I recommend  intravenous iron loading.  Avoid ESA with metastatic cancer. 7.  Hypermagnesemia: Suspected iatrogenic with intravenous loading for management of hypomagnesemia, follow levels.  Arihaan Bellucci K. 07/10/2019, 11:00 AM

## 2019-07-10 NOTE — Progress Notes (Signed)
Unable to take temperature orally and axillary hence, rectal temp was taken and it was 91.0.  MD text paged and an order for warming blanket received.  Will continue to monitor.

## 2019-07-10 NOTE — Progress Notes (Signed)
Subjective:  Patient appeared anxious and distressed. It was hard for her to communicate with [place] and get her thoughts across. She mentioned that she had ovarian cancer and that it hadn't spread anymore. She says she wants to continue to feel good although sometimes she feels bad. Her daughter says she had some lucid intervals overnight when her temperature came back up to normal. She states the patient gets very distressed adnd anxious whenever she changes position and that she becomes short of breath. She states that her dyspnea is not necessarily secondary to laying flat but just to any change in position. The patient says she looks forward to getting out of the hospital where there aren't beeping machines.   Objective:   Vital Signs (last 24 hours): Vitals:   07/10/19 0653 07/10/19 0700 07/10/19 0854 07/10/19 1054  BP: (!) 142/71  133/66   Pulse: 88 88 96   Resp: (!) 25 (!) 26 18   Temp: 100.1 F (37.8 C) 100.1 F (37.8 C) 100.3 F (37.9 C) 98.1 F (36.7 C)  TempSrc: Axillary Rectal Rectal Axillary  SpO2: 91% 93% 95%   Weight:  79.9 kg    Height:       Physical Exam: Physical Exam  Constitutional: She appears distressed.  Cardiovascular: Normal rate, regular rhythm, normal heart sounds and intact distal pulses.  No murmur heard. No LE edema   Pulmonary/Chest: She is in respiratory distress.  Tachypneic; crackles in lung bases bilaterally  Abdominal: Bowel sounds are normal. She exhibits no distension.  Neurological:  Only oriented to self  Skin: Skin is warm and dry. No erythema.    Assessment/Plan:   Principal Problem:   PRES (posterior reversible encephalopathy syndrome) Active Problems:   Fever   AKI (acute kidney injury) ([place])   Hypercalcemia   Hypomagnesemia, chronic  Ms. Ohagan is a 73 year old female with metastatic ovarian cancer on chemotherapy with paclitaxel/avastin and PE on Xarelto who presented with fever. Infectious workup unrevealing on admission. Due  to decline in mentation we obtained an MRI concerning for PRES syndrome. Patient with slightly improved mentation since management of bp for PRES however patient has since developed dyspnea with imaging and labs consistent with a multifocal pnuemonia for which she's being broadly treated and an oliguric renal failure likely ischemic secondary to low bps.   # Fever with AMS: Blood cultures show NGTD.  UA within normal limits and urine culture shows no growth.  CT scan and MRI brain do not reveal source for infection.  * LP yesterday without evidence for infection. * Chest x-ray concerning for multifocal pneumonia  * Patient hypothermic to 91.7 overnight improved with warming blanket, most recently 100.3  * given immunocompromised state, broadened antibiotic coverage to linezolid, cefepime, azithro * WBC up from 10.7 yesterday to 13.1 today * Unclear etiology for her temperatures, may be secondary to PRES causing thermodisregulation, treating with bp control, or infectious, repeat blood and urine cx pending   # Dyspnea/Pneumonia: Patient developed dyspnea while admitted, chest xray changed from admission and concerning for multifocal pneumonia, being treated with broad spectrum antibiotics * slight concern for volume overload given patient is oliguric, weight is up, patient dyspneic and elevated BNP of 270 but patient otherwise does not appear volume overloaded and picture more concerning for infection; treating with antibiotics and cxs pending  # PRES Syndrome: Blood pressure well controlled on current regimen with systolics ranging 440-347. PRES/Increased BP thought to be 2/2 bevacizumab.  * given new oliguric renal failure, need  to avoid hypotension per nephrology  * Continue amlodipine 10mg  QD and labetalol 100mg  TID  # Oliguric Renal Failure: Creatinine up to 3.59 from baseline of 1.07 on 06/19/2019. On maintenance fluids so low suspicion for prerenal causes. Unlikely to be post-renal - bladder  scan showed 53 mL. Stone on CT but followup renal US shows no evidence for obstruction. Urine studies show FeNa of 1.4, consistent with an intrinsic renal etiology, possibly ischemic ATN with transient hypotension with BPs as low as 77/47 yesterday.  * Nephro following and recommends supportive management, avoid hypotension, will not change BP meds at this time as most recent BP 130s/140s and would not want to go higher * monitor ins and outs and creatinine   * patient cathed   # Anemia: Iron studies consistent with anemia of chronic disease. Hemoglobin 7.2 today.  * Patient SOB but not lightheaded * Monitor with CBC. Type and Screen today. Iron infusion per nephro.   Dispo: Anticipated discharge pending clinical improvement.   Al Decant, MD 07/10/2019, 8:13 AM Pager: [phone]

## 2019-07-10 NOTE — Progress Notes (Addendum)
The night float team went to [name]'s  bedside after receiving a page for her newfound low temperature. At 2130 her temperature registered at 91.7 F, for which a Bair Hugger was ordered. After an hour, she made slight improvement (93.42F).   [name] was found sleeping in her bed. She was easily woken, but was nonverbal. Most of the history was given by her daughter at bedside. She states that after the day's events her mother was "the most lucid" she has seen her since her stay at [facility]. She states that about several hours ago she began to stop speaking and her temperature began to decline. Her daughter stated that before her temperature declined, [name] asked to sit at the side of her bed to breath. Her daughter states that her mother calmed down after sitting sitting.    Physical exam findings:  Cardio: Regular rate and rhythm, no murmurs, rubs, or gallops Vascular: Pulses intact Pulmonary: Diffuse wheezing auscultated in the upper lobes bilaterally.   Plan:  - Continue to monitor patient throughout the night and to be notified if there are any changes.  - Duoneb treatment.

## 2019-07-11 ENCOUNTER — Inpatient Hospital Stay (HOSPITAL_COMMUNITY): Payer: Medicare Other

## 2019-07-11 DIAGNOSIS — N170 Acute kidney failure with tubular necrosis: Secondary | ICD-10-CM

## 2019-07-11 DIAGNOSIS — R0600 Dyspnea, unspecified: Secondary | ICD-10-CM

## 2019-07-11 DIAGNOSIS — R680 Hypothermia, not associated with low environmental temperature: Secondary | ICD-10-CM

## 2019-07-11 DIAGNOSIS — Z86711 Personal history of pulmonary embolism: Secondary | ICD-10-CM

## 2019-07-11 DIAGNOSIS — Z7901 Long term (current) use of anticoagulants: Secondary | ICD-10-CM

## 2019-07-11 DIAGNOSIS — J189 Pneumonia, unspecified organism: Secondary | ICD-10-CM

## 2019-07-11 DIAGNOSIS — D72829 Elevated white blood cell count, unspecified: Secondary | ICD-10-CM

## 2019-07-11 LAB — CSF CULTURE W GRAM STAIN: Culture: NO GROWTH

## 2019-07-11 LAB — CBC WITH DIFFERENTIAL/PLATELET
Abs Immature Granulocytes: 1.99 10*3/uL — ABNORMAL HIGH (ref 0.00–0.07)
Basophils Absolute: 0.1 10*3/uL (ref 0.0–0.1)
Basophils Relative: 1 %
Eosinophils Absolute: 0.1 10*3/uL (ref 0.0–0.5)
Eosinophils Relative: 1 %
HCT: 22 % — ABNORMAL LOW (ref 36.0–46.0)
Hemoglobin: 7.4 g/dL — ABNORMAL LOW (ref 12.0–15.0)
Immature Granulocytes: 12 %
Lymphocytes Relative: 11 %
Lymphs Abs: 1.9 10*3/uL (ref 0.7–4.0)
MCH: 33.5 pg (ref 26.0–34.0)
MCHC: 33.6 g/dL (ref 30.0–36.0)
MCV: 99.5 fL (ref 80.0–100.0)
Monocytes Absolute: 3 10*3/uL — ABNORMAL HIGH (ref 0.1–1.0)
Monocytes Relative: 18 %
Neutro Abs: 9.8 10*3/uL — ABNORMAL HIGH (ref 1.7–7.7)
Neutrophils Relative %: 57 %
Platelets: 443 10*3/uL — ABNORMAL HIGH (ref 150–400)
RBC: 2.21 MIL/uL — ABNORMAL LOW (ref 3.87–5.11)
RDW: 21.2 % — ABNORMAL HIGH (ref 11.5–15.5)
WBC: 16.9 10*3/uL — ABNORMAL HIGH (ref 4.0–10.5)
nRBC: 10.6 % — ABNORMAL HIGH (ref 0.0–0.2)

## 2019-07-11 LAB — BLOOD GAS, ARTERIAL
Acid-base deficit: 6.7 mmol/L — ABNORMAL HIGH (ref 0.0–2.0)
Bicarbonate: 17.8 mmol/L — ABNORMAL LOW (ref 20.0–28.0)
Drawn by: 59887
O2 Content: 3 L/min
O2 Saturation: 97.6 %
Patient temperature: 98.6
pCO2 arterial: 32.7 mmHg (ref 32.0–48.0)
pH, Arterial: 7.355 (ref 7.350–7.450)
pO2, Arterial: 105 mmHg (ref 83.0–108.0)

## 2019-07-11 LAB — COMPREHENSIVE METABOLIC PANEL
ALT: 18 U/L (ref 0–44)
AST: 24 U/L (ref 15–41)
Albumin: 2.1 g/dL — ABNORMAL LOW (ref 3.5–5.0)
Alkaline Phosphatase: 73 U/L (ref 38–126)
Anion gap: 11 (ref 5–15)
BUN: 15 mg/dL (ref 8–23)
CO2: 18 mmol/L — ABNORMAL LOW (ref 22–32)
Calcium: 7.6 mg/dL — ABNORMAL LOW (ref 8.9–10.3)
Chloride: 105 mmol/L (ref 98–111)
Creatinine, Ser: 2.33 mg/dL — ABNORMAL HIGH (ref 0.44–1.00)
GFR calc Af Amer: 23 mL/min — ABNORMAL LOW (ref >60)
GFR calc non Af Amer: 20 mL/min — ABNORMAL LOW (ref >60)
Glucose, Bld: 104 mg/dL — ABNORMAL HIGH (ref 70–99)
Potassium: 3 mmol/L — ABNORMAL LOW (ref 3.5–5.1)
Sodium: 134 mmol/L — ABNORMAL LOW (ref 135–145)
Total Bilirubin: 0.4 mg/dL (ref 0.3–1.2)
Total Protein: 5.5 g/dL — ABNORMAL LOW (ref 6.5–8.1)

## 2019-07-11 LAB — URINE CULTURE: Culture: <10000 — AB

## 2019-07-11 LAB — SARS CORONAVIRUS 2 BY RT PCR (HOSPITAL ORDER, PERFORMED IN ~~LOC~~ HOSPITAL LAB): SARS Coronavirus 2: NEGATIVE

## 2019-07-11 LAB — HEPARIN LEVEL (UNFRACTIONATED): Heparin Unfractionated: 0.61 IU/mL (ref 0.30–0.70)

## 2019-07-11 LAB — CULTURE, BLOOD (ROUTINE X 2)
Culture: NO GROWTH
Special Requests: ADEQUATE

## 2019-07-11 LAB — MAGNESIUM: Magnesium: 2.3 mg/dL (ref 1.7–2.4)

## 2019-07-11 LAB — OLIGOCLONAL BANDS, CSF + SERM

## 2019-07-11 MED ORDER — MORPHINE SULFATE (PF) 2 MG/ML IV SOLN
1.0000 mg | Freq: Once | INTRAVENOUS | Status: AC
  Administered 2019-07-11: 1 mg via INTRAVENOUS
  Filled 2019-07-11: qty 1

## 2019-07-11 MED ORDER — MORPHINE SULFATE (PF) 2 MG/ML IV SOLN
INTRAVENOUS | Status: AC
  Administered 2019-07-11: 10:00:00 2 mg via INTRAVENOUS
  Filled 2019-07-11: qty 1

## 2019-07-11 MED ORDER — POTASSIUM CHLORIDE 10 MEQ/100ML IV SOLN
10.0000 meq | INTRAVENOUS | Status: AC
  Administered 2019-07-11 – 2019-07-12 (×3): 10 meq via INTRAVENOUS

## 2019-07-11 MED ORDER — POTASSIUM CHLORIDE 20 MEQ PO PACK
20.0000 meq | PACK | Freq: Two times a day (BID) | ORAL | Status: DC
  Administered 2019-07-11 – 2019-07-17 (×12): 20 meq via ORAL
  Filled 2019-07-11 (×14): qty 1

## 2019-07-11 MED ORDER — LABETALOL HCL 100 MG PO TABS: 100.0000 mg | ORAL_TABLET | Freq: Two times a day (BID) | ORAL | Status: DC

## 2019-07-11 MED ORDER — MORPHINE SULFATE (PF) 2 MG/ML IV SOLN
2.0000 mg | Freq: Once | INTRAVENOUS | Status: AC
  Administered 2019-07-11 (×2): 2 mg via INTRAVENOUS

## 2019-07-11 MED ORDER — LABETALOL HCL 100 MG PO TABS
50.0000 mg | ORAL_TABLET | Freq: Two times a day (BID) | ORAL | Status: DC
  Administered 2019-07-11 – 2019-07-17 (×13): 50 mg via ORAL
  Filled 2019-07-11 (×13): qty 1

## 2019-07-11 MED ORDER — POTASSIUM CHLORIDE 10 MEQ/100ML IV SOLN
10.0000 meq | INTRAVENOUS | Status: AC
  Administered 2019-07-11: 10 meq via INTRAVENOUS
  Filled 2019-07-11 (×4): qty 100

## 2019-07-11 MED ORDER — FUROSEMIDE 10 MG/ML IJ SOLN
40.0000 mg | Freq: Once | INTRAMUSCULAR | Status: AC
  Administered 2019-07-11: 40 mg via INTRAVENOUS
  Filled 2019-07-11: qty 4

## 2019-07-11 MED ORDER — POTASSIUM CHLORIDE CRYS ER 20 MEQ PO TBCR
40.0000 meq | EXTENDED_RELEASE_TABLET | Freq: Two times a day (BID) | ORAL | Status: DC
  Administered 2019-07-11: 40 meq via ORAL
  Filled 2019-07-11: qty 4
  Filled 2019-07-11: qty 2

## 2019-07-11 NOTE — Progress Notes (Signed)
Physical Therapy Treatment Patient Details Name: Rhonda Hale Noori MRN: [id number] DOB: 1946-04-02 Today's Date: 07/11/2019    History of Present Illness Rhonda Hale is a 73 y.o. woman with PMHx significant for ovarian cancer status post hysterectomy and splenectomy, on chemotherapy, presented to ED with fever and chills, maximum temperature 103F, and nontraumatic fall at home prior to arrival. Associated symptoms include decreased appetite, decreased oral intake, fatigue and weakness for two days.     PT Comments    Session focused on education with family in room on bed level therex to mitigate muscle deconditioning. Daughter requesting patient not mobilize today but agreeable to work with these exercises and assist form, no questions or concerns. Indicated we would check on patient and assist mobility as tolerated. Pts daughter reports still planning on returning home when able.    Follow Up Recommendations  Home health PT;Supervision/Assistance - 24 hour     Equipment Recommendations  None recommended by PT    Recommendations for Other Services       Precautions / Restrictions Precautions Precautions: Fall Restrictions Weight Bearing Restrictions: No    Mobility  Bed Mobility Overal bed mobility: Modified Independent             General bed mobility comments: increased time and effort  Transfers Overall transfer level: Modified independent                  Ambulation/Gait                 Stairs             Wheelchair Mobility    Modified Rankin (Stroke Patients Only)       Balance Overall balance assessment: History of Falls                                          Cognition Arousal/Alertness: Awake/alert Behavior During Therapy: WFL for tasks assessed/performed Overall Cognitive Status: Within Functional Limits for tasks assessed                                        Exercises General Exercises  - Lower Extremity Ankle Circles/Pumps: 20 reps Quad Sets: 5 reps Gluteal Sets: 5 reps Heel Slides: 5 reps Hip ABduction/ADduction: 5 reps Straight Leg Raises: 5 reps    General Comments        Pertinent Vitals/Pain      Home Living                      Prior Function            PT Goals (current goals can now be found in the care plan section) Acute Rehab PT Goals Patient Stated Goal: go home PT Goal Formulation: With patient/family Time For Goal Achievement: 07/18/19 Potential to Achieve Goals: Good Progress towards PT goals: Progressing toward goals    Frequency    Min 3X/week      PT Plan      Co-evaluation              AM-PAC PT "6 Clicks" Mobility   Outcome Measure  Help needed turning from your back to your side while in a flat bed without using bedrails?: A Little Help needed moving from lying on your back to  sitting on the side of a flat bed without using bedrails?: A Little Help needed moving to and from a bed to a chair (including a wheelchair)?: A Little Help needed standing up from a chair using your arms (e.g., wheelchair or bedside chair)?: A Little Help needed to walk in hospital room?: A Lot Help needed climbing 3-5 steps with a railing? : A Lot 6 Click Score: 16    End of Session Equipment Utilized During Treatment: Gait belt Activity Tolerance: Patient tolerated treatment well Patient left: in bed;with call bell/phone within reach Nurse Communication: Mobility status PT Visit Diagnosis: Unsteadiness on feet (R26.81)     Time: 1040-1100 PT Time Calculation (min) (ACUTE ONLY): 20 min  Charges:  $Therapeutic Exercise: 8-22 mins                     Reinaldo Berber, PT, DPT Acute Rehabilitation Services Pager: [phone] Office: [place] 07/11/2019, 12:24 PM

## 2019-07-11 NOTE — Progress Notes (Signed)
SLP Cancellation Note  Patient Details Name: Rhonda Hale MRN: [id number] DOB: [birth year]   Cancelled treatment:       Reason Eval/Treat Not Completed: Patient's level of consciousness. Patient had anxiety and SOB, difficulty breathing this AM per daughter and now is resting comfortably. Patient opens eyes briefly and speaks to SLP. Per daughter they have been completing frequent oral care. Patient stuck out her tongue, which looked significantly better than yesterday.    [name] 07/11/2019, 11:18 AM   [name], MA, CCC-SLP Speech Therapy [facility] Acute Rehab Pager: [phone]

## 2019-07-11 NOTE — Progress Notes (Signed)
OT Cancellation Note  Patient Details Name: Rhonda Hale MRN: [id number] DOB: [birth year]   Cancelled Treatment:    Reason Eval/Treat Not Completed: Fatigue/lethargy limiting ability to participate. Pt resting comfortably after period of anxiety and breathing earlier today per daughter. Will continue to follow.  Malka So 07/11/2019, 12:06 PM  Nestor Lewandowsky, OTR/L Acute Rehabilitation Services Pager: [phone] Office: [phone]

## 2019-07-11 NOTE — Progress Notes (Signed)
Patient ID: Rhonda Hale, female   DOB: 06-07-1946, 73 y.o.   MRN: [id number] [facility] Progress Note   Assessment/ Plan:   1.  Acute kidney injury on chronic kidney disease stage III:  Suspected ATN with transient hypotension that appears to be improving with conservative management/blood pressure support.  Potential nephrotoxic medications including acyclovir discontinued.  Decent urine output overnight with improving BUN and creatinine overnight.  Chest x-ray from yesterday reviewed and significant for some vascular congestion-we will discontinue fluids at this time.  Physical exam does not reveal significant pulmonary edema but would recommend a follow-up chest x-ray to see if she requires diuresis to augment urine output. 2.  Hyponatremia:  Stable/unchanged, possibly paraneoplastic and aggravated by hypotonic fluids.  Mental status/inconsistent oral intake implementing fluid restriction. 3.  Anion gap metabolic acidosis: With mild anion gap when corrected for hypoalbuminemia.  Begin low-dose supplemental sodium bicarbonate. 4.  Fever/encephalopathy: Treated empirically for pneumonia and ongoing evaluation for viral meningitis/encephalitis and currently on broad-spectrum antimicrobial therapy with cefepime, azithromycin and linezolid.  MRI of the brain done 3 days ago shows features of posterior reversible encephalopathy syndrome likely associated with bevacizumab. 5.  Metastatic ovarian cancer: Recently on treatment with paclitaxel and Avastin. 6.  Anemia of chronic illness/malignancy: Iron stores low and I recommend intravenous iron loading.  Avoid ESA with metastatic cancer. 7.  Hypermagnesemia: Suspected iatrogenic with intravenous loading for management of hypomagnesemia, follow levels. 8.  Hypokalemia: Replace via oral route  Subjective:   Some shortness of breath this morning requiring nebulizer.  Daughter and RN at bedside.   Objective:   BP (!) 142/72   Pulse 91   Temp  98.6 F (37 C) (Axillary)   Resp (!) 24   Ht 5\' 2"  (1.575 m)   Wt 81.5 kg   SpO2 96%   BMI 32.86 kg/m   Intake/Output Summary (Last 24 hours) at 07/11/2019 1017 Last data filed at 07/11/2019 9417 Gross per 24 hour  Intake 2446.31 ml  Output 1401 ml  Net 1045.31 ml   Weight change: -0.8 kg  Physical Exam: Gen: Comfortable resting in bed, somnolent CVS: Pulse regular rhythm, normal rate, S1 and S2 normal Resp: Expiratory rhonchi bilaterally, no distinct rales Abd: Soft, obese, nontender Ext: No lower extremity edema  Imaging: Ct Head Wo Contrast  Result Date: 07/09/2019 CLINICAL DATA:  Altered mental status with syncope and lethargy EXAM: CT HEAD WITHOUT CONTRAST TECHNIQUE: Contiguous axial images were obtained from the base of the skull through the vertex without intravenous contrast. COMPARISON:  None. FINDINGS: Brain: There is mild diffuse atrophy. There is no intracranial mass, hemorrhage, extra-axial fluid collection, or midline shift. There is relatively mild patchy small vessel disease in the centra semiovale bilaterally. No acute infarct evident. Vascular: No evident hyperdense vessel. There is calcification in each carotid siphon region. Skull: The bony calvarium appears intact. Sinuses/Orbits: There is slight mucosal thickening in several ethmoid air cells. Orbits appear symmetric bilaterally. Patient has had cataract removals bilaterally. Other: Mastoid air cells are clear. IMPRESSION: Mild atrophy with mild patchy periventricular small vessel disease. No acute infarct. No mass or hemorrhage. There are foci of arterial vascular calcification. There is mucosal thickening in several ethmoid air cells. Electronically Signed   By: Lowella Grip III M.D.   On: 07/09/2019 15:09   Dg Chest Port 1 View  Result Date: 07/10/2019 CLINICAL DATA:  Shortness of breath and weakness. EXAM: PORTABLE CHEST 1 VIEW COMPARISON:  07/08/2019 FINDINGS: Right IJ Port-A-Cath unchanged. Postsurgical  changes over the left perihilar region and upper lung. Lungs are adequately inflated. Mild prominence of the perihilar markings suggesting mild degree of vascular congestion. No evidence of effusion or pneumothorax. Mild stable cardiomegaly. Remainder of the exam is unchanged. IMPRESSION: Mild prominence of the perihilar markings suggesting mild degree of vascular congestion. Right IJ Port-A-Cath unchanged Electronically Signed   By: Marin Olp M.D.   On: 07/10/2019 12:58    Labs: BMET Recent Labs  Lab 07/08/19 0327 07/09/19 0429 07/09/19 1501 07/10/19 0022 07/10/19 0452 07/10/19 1451 07/11/19 0351  NA 135 134* 132* 133* 134* 134* 134*  K 3.0* 3.4* 3.8 3.9 3.6 3.3* 3.0*  CL 102 103 103 105 105 106 105  CO2 24 20* 18* 19* 18* 18* 18*  GLUCOSE 152* 94 240* 124* 118* 148* 104*  BUN 7* 12 15 18 18 18 15   CREATININE 1.09* 2.63* 3.22* 3.59* 3.38* 3.11* 2.33*  CALCIUM 7.1* 7.1* 6.8* 7.7* 7.5* 7.2* 7.6*   CBC Recent Labs  Lab 07/08/19 0327 07/09/19 0429 07/09/19 1501 07/10/19 0452 07/11/19 0351  WBC 9.5 10.6* 10.7* 13.1* 16.9*  NEUTROABS 4.2 4.7  --  10.1* 9.8*  HGB 7.1* 7.6* 7.3* 7.2* 7.4*  HCT 21.5* 23.3* 22.7* 22.0* 22.0*  MCV 100.0 99.1 102.3* 98.7 99.5  PLT 346 385 370 400 443*    Medications:    . amLODipine  10 mg Oral Daily  . calcium-vitamin D  1 tablet Oral BID  . feeding supplement  1 Container Oral TID BM  . feeding supplement (PRO-STAT SUGAR FREE 64)  30 mL Oral BID WC  . levothyroxine  137 mcg Oral QAC breakfast  . morphine      . multivitamin with minerals  1 tablet Oral Daily  . potassium chloride  40 mEq Oral BID WC  . sodium chloride flush  3 mL Intravenous Q12H   Elmarie Shiley, MD 07/11/2019, 10:17 AM

## 2019-07-11 NOTE — Progress Notes (Signed)
ANTICOAGULATION CONSULT NOTE - [facility] for Heparin Indication: pulmonary embolus  Allergies  Allergen Reactions  . Atorvastatin Other (See Comments)    Severe heartburn, reflux    Patient Measurements: Height: 5\' 2"  (157.5 cm) Weight: 179 lb 10.8 oz (81.5 kg) IBW/kg (Calculated) : 50.1 Heparin Dosing Weight: 67.4 kg  Vital Signs: Temp: 98.6 F (37 C) (07/30 0517) Temp Source: Axillary (07/30 0517) BP: 142/72 (07/30 0930) Pulse Rate: 91 (07/30 0930)  Labs: Recent Labs    07/09/19 1501  07/09/19 1831 07/10/19 0022 07/10/19 0452 07/10/19 1021 07/10/19 1451 07/11/19 0351  HGB 7.3*  --   --   --  7.2*  --   --  7.4*  HCT 22.7*  --   --   --  22.0*  --   --  22.0*  PLT 370  --   --   --  400  --   --  443*  APTT  --   --  44*  --   --   --   --   --   HEPARINUNFRC  --    < > <0.10* 0.36  --  0.49  --  0.61  CREATININE 3.22*  --   --  3.59* 3.38*  --  3.11* 2.33*  TROPONINIHS 18*  --   --   --   --   --   --   --    < > = values in this interval not displayed.    Estimated Creatinine Clearance: 20.6 mL/min (A) (by C-G formula based on SCr of 2.33 mg/dL (H)).  Assessment: 73 yr old female with hx of PE, on Xarelto 20 mg/day at home (last dose of Xarelto while inpt was on 07/06/19, in prep for LP). Pharmacy is consulted to dose heparin   Heparin level on 7/30 is therapeutic at 0.61 on 1100 units/hr. Initial heparin level <0.1 shows any anti-Xa activity from Xarelto unlikely. CBC is stable with Hgb 7.4 and plts of 443. No s/sx bleeding or infusion issues per nursing  Of note, pt has AKI, with Scr trending down (from 3.11 yesterday to 2.33 today) Other medical hx includes: ovarian cancer (S/P hysterectomy, splenectomy, on chemo), fever with AMS, PRESS, hypercalcemia, anemia.   Goal of Therapy:  Heparin level 0.3-0.7 units/ml Monitor platelets by anticoagulation protocol: Yes   Plan:  -Continue heparin 1100 units/hr -Check heparin level with  morning labs  -Monitor for s/sx of bleeding     Thank you,   Eddie Candle, PharmD PGY-1 Pharmacy Resident  Phone: [phone]

## 2019-07-11 NOTE — Progress Notes (Signed)
  Date: 07/11/2019  Patient name: Rhonda Hale  Medical record number: [id number]  Date of birth: 12-Sep-1946   I have seen and evaluated this patient and I have discussed the plan of care with the house staff. Please see their note for complete details. I concur with their findings with the following additions/corrections: Ms. Spada was seen this morning on team rounds.  Her daughter was at the bedside.  Ms. Mccaughey became very tachypneic, distraught, had labored breathing, coughing, and was anxious.  She was able to get to the side of the bed.  She got morphine for air hunger and that helped ambulate around the daughter stated it helped a lot and she was able to lie to get her CT scan.  She has not had any more hypothermia or hyperthermia.  Blood pressure is a little low intermittently as has ranged from 92 systolic to 122.  White blood cell count is increased to 16.9.  Lung exam showed rhonchi bilaterally but improved from yesterday.  Her CT scan showed bilateral groundglass opacities throughout.  There were no significant changes of pulmonary edema.  Her ABG showed 7.36 / 33 / 105.  This shows a metabolic acidosis without a gap and appropriate respiratory compensation.  This is surprising as of her tachypnea I would have expected a respiratory alkalosis.  I have reviewed IDs note and PCP is unlikely as if she is not hypoxic on ABG (that was done on 3 L O2).  Discussed with ID and it does not completely rule out PCP since it was on oxygen but is very low on the differential.  If she becomes hypoxic with ambulation, PCP might move a little bit up on the differential.  We are reordering COVID and will do in-house 2-hour turnaround time.  We will continue with morphine for air hunger and nebulizers once her COVID comes back negative.  The daughter states her mental status is almost back to normal.  Her blood pressure has been well controlled, sometimes too well controlled, on her current regimen.  Her  oliguric renal failure from suspected ATN is improving.  We need to avoid hypotension which is currently difficult of drainage to avoid hypertension due to her PRES.  Her respiratory distress is also intermittently elevating her blood pressure.  Bartholomew Crews, MD 07/11/2019, 4:17 PM

## 2019-07-11 NOTE — Progress Notes (Signed)
[facility] Infectious Disease   Reason for visit: Follow up on fever, hypothermia, pneumonia,   Interval History:  Patient had repeat febrile episodes while admitted and recently experienced hypothermic episode to 91.7 on 7/28. Patient had CXR on 7/27 which was changed from admission and had concern for multifocal pneumonia. Patient had CT Chest today which revealed extensive bilateral irregular ground-glass and consolidative opacities most consistent with multifocal infection. CSF shows no growth at 3 days, repeat blood cultures x2 were drawn yesterday which show no growth at <24 hours. WBC of 16.9 today trending up from 13.1 yesterday, afebrile over past 24 hours. Started on rocephin+azithromycin on 7/28 given concerns for multifocal pneumonia on CXR, broadened to cefepime+azithromycin+linezolid following hypothermic episode.   Per daughter, patient's mental status has been improving and is near baseline. Patient has continued weakness and occasional difficulty breathing in which she will sit up quickly on the side of the bed to catch her breath.   Impression: 73 year old female with CT findings concerning for viral vs. bacterial pneumonia. Patient afebrile over past 24 hours with improvement in mental status. Findings on CT suggest possible multifocal pneumonia. Hospital acquired pneumonia is in the differential, but findings may be viral in nature. Low concern for pneumocystis given normal ABG.  Plan:  1. Will discontinuing Linezolid as low suspicion for MRSA pneumonia given presentation on CT. Also, will discontinue azithromycin as low suspicion for atypicals with timing of infection 2. Will continue cefepime to cover for hospital acquired pneumonia, although this may be a viral pneumonia 3. COVID retest ordered per primary team    Physical Exam: Constitutional: Sleeping in NAD Vitals:   07/11/19 1207 07/11/19 1314  BP: (!) 119/52 (!) 90/52  Pulse: 86 83  Resp: (!) 24 (!)  22  Temp: 98.5 F (36.9 C)   SpO2: 97% 96%  Respiratory: Normal respiratory effort; CTA B Cardiovascular: RRR GI: soft, nt, nd  Review of Systems: Per daughter patient has had wet cough but patient has not produced sputum  Lab Results  Component Value Date   WBC 16.9 (H) 07/11/2019   HGB 7.4 (L) 07/11/2019   HCT 22.0 (L) 07/11/2019   MCV 99.5 07/11/2019   PLT 443 (H) 07/11/2019    Lab Results  Component Value Date   CREATININE 2.33 (H) 07/11/2019   BUN 15 07/11/2019   NA 134 (L) 07/11/2019   K 3.0 (L) 07/11/2019   CL 105 07/11/2019   CO2 18 (L) 07/11/2019    Lab Results  Component Value Date   ALT 18 07/11/2019   AST 24 07/11/2019   ALKPHOS 73 07/11/2019     Microbiology: Recent Results (from the past 240 hour(s))  Blood Culture (routine x 2)     Status: None   Collection Time: 07/03/19 10:00 AM   Specimen: BLOOD RIGHT HAND  Result Value Ref Range Status   Specimen Description BLOOD RIGHT HAND  Final   Special Requests   Final    BOTTLES DRAWN AEROBIC AND ANAEROBIC Blood Culture adequate volume   Culture   Final    NO GROWTH 5 DAYS Performed at [facility], [address] [place], [place] [address]    Report Status 07/08/2019 FINAL  Final  Blood Culture (routine x 2)     Status: None   Collection Time: 07/03/19 10:07 AM   Specimen: BLOOD RIGHT WRIST  Result Value Ref Range Status   Specimen Description BLOOD RIGHT WRIST  Final  Special Requests   Final    BOTTLES DRAWN AEROBIC AND ANAEROBIC Blood Culture results may not be optimal due to an inadequate volume of blood received in culture bottles   Culture   Final    NO GROWTH 5 DAYS Performed at [facility], [place] [address] [place], [place] [address]    Report Status 07/08/2019 FINAL  Final  SARS Coronavirus 2 (CEPHEID- Performed in [place] [facility] hospital lab), Hosp Order     Status: None   Collection Time: 07/03/19 10:13 AM   Specimen: Nasopharyngeal Swab  Result Value Ref Range Status    SARS Coronavirus 2 NEGATIVE NEGATIVE Final    Comment: (NOTE) If result is NEGATIVE SARS-CoV-2 target nucleic acids are NOT DETECTED. The SARS-CoV-2 RNA is generally detectable in upper and lower  respiratory specimens during the acute phase of infection. The lowest  concentration of SARS-CoV-2 viral copies this assay can detect is 250  copies / mL. A negative result does not preclude SARS-CoV-2 infection  and should not be used as the sole basis for treatment or other  patient management decisions.  A negative result may occur with  improper specimen collection / handling, submission of specimen other  than nasopharyngeal swab, presence of viral mutation(s) within the  areas targeted by this assay, and inadequate number of viral copies  (<250 copies / mL). A negative result must be combined with clinical  observations, patient history, and epidemiological information. If result is POSITIVE SARS-CoV-2 target nucleic acids are DETECTED. The SARS-CoV-2 RNA is generally detectable in upper and lower  respiratory specimens dur ing the acute phase of infection.  Positive  results are indicative of active infection with SARS-CoV-2.  Clinical  correlation with patient history and other diagnostic information is  necessary to determine patient infection status.  Positive results do  not rule out bacterial infection or co-infection with other viruses. If result is PRESUMPTIVE POSTIVE SARS-CoV-2 nucleic acids MAY BE PRESENT.   A presumptive positive result was obtained on the submitted specimen  and confirmed on repeat testing.  While 2019 novel coronavirus  (SARS-CoV-2) nucleic acids may be present in the submitted sample  additional confirmatory testing may be necessary for epidemiological  and / or clinical management purposes  to differentiate between  SARS-CoV-2 and other Sarbecovirus currently known to infect humans.  If clinically indicated additional testing with an alternate test   methodology [phone]) is advised. The SARS-CoV-2 RNA is generally  detectable in upper and lower respiratory sp ecimens during the acute  phase of infection. The expected result is Negative. Fact Sheet for Patients:  StrictlyIdeas.no Fact Sheet for Healthcare Providers: BankingDealers.co.za This test is not yet approved or cleared by the [place] FDA and has been authorized for detection and/or diagnosis of SARS-CoV-2 by FDA under an Emergency Use Authorization (EUA).  This EUA will remain in effect (meaning this test can be used) for the duration of the COVID-19 declaration under Section 564(b)(1) of the Act, 21 U.S.C. section 360bbb-3(b)(1), unless the authorization is terminated or revoked sooner. Performed at [facility], [place] [address], [place], [place] [address]   Urine culture     Status: None   Collection Time: 07/03/19  4:11 PM   Specimen: In/Out Cath Urine  Result Value Ref Range Status   Specimen Description IN/OUT CATH URINE  Final   Special Requests NONE  Final   Culture   Final    NO GROWTH Performed at [facility], [address] [place], [place] [address]    Report Status 07/04/2019 FINAL  Final  MRSA PCR Screening     Status: None   Collection Time: 07/04/19  7:48 AM   Specimen: Nasal Mucosa; Nasopharyngeal  Result Value Ref Range Status   MRSA by PCR NEGATIVE NEGATIVE Final    Comment:        The GeneXpert MRSA Assay (FDA approved for NASAL specimens only), is one component of a comprehensive MRSA colonization surveillance program. It is not intended to diagnose MRSA infection nor to guide or monitor treatment for MRSA infections. Performed at [facility], [place] [address], [place], [place] [address]   Culture, blood (routine x 2)     Status: None   Collection Time: 07/05/19  7:12 AM   Specimen: BLOOD  Result Value Ref Range Status   Specimen Description BLOOD RIGHT ANTECUBITAL   Final   Special Requests   Final    BOTTLES DRAWN AEROBIC AND ANAEROBIC Blood Culture adequate volume   Culture   Final    NO GROWTH 5 DAYS Performed at [facility], [place] [address], [place], [place] [address]    Report Status 07/10/2019 FINAL  Final  Culture, blood (routine x 2)     Status: None   Collection Time: 07/05/19  7:17 AM   Specimen: BLOOD RIGHT HAND  Result Value Ref Range Status   Specimen Description BLOOD RIGHT HAND  Final   Special Requests   Final    BOTTLES DRAWN AEROBIC ONLY Blood Culture adequate volume   Culture   Final    NO GROWTH 5 DAYS Performed at [facility], [place] [address] [place], [place] [address]    Report Status 07/10/2019 FINAL  Final  Culture, blood (routine x 2)     Status: None   Collection Time: 07/06/19  3:08 AM   Specimen: BLOOD RIGHT HAND  Result Value Ref Range Status   Specimen Description BLOOD RIGHT HAND  Final   Special Requests   Final    BOTTLES DRAWN AEROBIC AND ANAEROBIC Blood Culture adequate volume   Culture   Final    NO GROWTH 5 DAYS Performed at [facility], [place] [address], [place], [place] [address]    Report Status 07/11/2019 FINAL  Final  Anaerobic culture     Status: None (Preliminary result)   Collection Time: 07/08/19  3:16 PM   Specimen: CSF; Cerebrospinal Fluid  Result Value Ref Range Status   Specimen Description CSF  Final   Special Requests   Final    NONE Performed at [facility], [place] [address] [place], [place] [address]    Gram Stain PENDING  Incomplete   Culture   Final    NO ANAEROBES ISOLATED; CULTURE IN PROGRESS FOR 5 DAYS   Report Status PENDING  Incomplete  CSF culture     Status: None (Preliminary result)   Collection Time: 07/08/19  3:16 PM   Specimen: CSF; Cerebrospinal Fluid  Result Value Ref Range Status   Specimen Description CSF  Final   Special Requests NONE  Final   Gram Stain   Final    WBC PRESENT, PREDOMINANTLY MONONUCLEAR NO ORGANISMS SEEN CYTOSPIN  SMEAR    Culture   Final    NO GROWTH 3 DAYS Performed at [facility], [place] [address] [place],  [address]    Report Status PENDING  Incomplete  Fungus Culture With Stain  Status: None (Preliminary result)   Collection Time: 07/08/19  3:16 PM   Specimen: CSF; Cerebrospinal Fluid  Result Value Ref Range Status   Fungus Stain Final report  Final    Comment: (NOTE) Performed At: [facility] [place], [place] [id number] Rush Farmer MD QA:[id number]    Fungus (Mycology) Culture PENDING  Incomplete   Fungal Source CSF  Final    Comment: Performed at [facility], [place] [address], [place], [place] [address]  Fungus Culture Result     Status: None   Collection Time: 07/08/19  3:16 PM  Result Value Ref Range Status   Result 1 Comment  Final    Comment: (NOTE) KOH/Calcofluor preparation:  no fungus observed. Performed At: [facility] [place], [place] [id number] Rush Farmer MD DC:[id number]   Culture, Urine     Status: Abnormal   Collection Time: 07/10/19  9:08 AM   Specimen: Urine, Random  Result Value Ref Range Status   Specimen Description URINE, RANDOM  Final   Special Requests NONE  Final   Culture (A)  Final    <10,000 COLONIES/mL INSIGNIFICANT GROWTH Performed at [facility], [address] [place], [place] [address]    Report Status 07/11/2019 FINAL  Final  Culture, blood (routine x 2)     Status: None (Preliminary result)   Collection Time: 07/10/19 10:21 AM   Specimen: BLOOD RIGHT HAND  Result Value Ref Range Status   Specimen Description BLOOD RIGHT HAND  Final   Special Requests   Final    BOTTLES DRAWN AEROBIC AND ANAEROBIC Blood Culture adequate volume   Culture   Final    NO GROWTH < 24 HOURS Performed at [facility], [place] [address], [place], [place] [address]    Report Status PENDING  Incomplete  Culture, blood (routine x 2)     Status: None (Preliminary result)    Collection Time: 07/10/19 10:41 AM   Specimen: BLOOD LEFT HAND  Result Value Ref Range Status   Specimen Description BLOOD LEFT HAND  Final   Special Requests   Final    BOTTLES DRAWN AEROBIC AND ANAEROBIC Blood Culture results may not be optimal due to an inadequate volume of blood received in culture bottles   Culture   Final    NO GROWTH < 24 HOURS Performed at [facility], [place] [address] [place], [place] [address]    Report Status PENDING  Incomplete

## 2019-07-11 NOTE — Progress Notes (Signed)
Dr. Madilyn Fireman placed orders to put pt on contact and airborne precautions. Dr. Benjamine Mola, ID was in the room, talking to pt's daughter.  After Benjamine Mola left I walked in the room and let the patients daughter know that we are working on getting a room to transfer the patient and the daughter had no idea that the patient will be tested for covid.    I called Dr. Madilyn Fireman and asked her if she would come and talk tot he patient's daughter. She said she would. I also let Dr. Madilyn Fireman know that we do not have airborne room on 5[facility] and to please call us in the future if she needs to transfer a patient.

## 2019-07-11 NOTE — Progress Notes (Signed)
Subjective:  Patient appeared anxious and distressed with trouble breathing when we entered the room. She hurriedly moved to the edge of the bed to improve her breathing and without support would likely have fallen out. She was in great distress, coughing and laying against my stomach, looking up and stating, "I want to die." The daughter was at bedside and reported that her mother has episodes where she frantically moves to the edge of the bed to catch her breath and cough. She states that they are doing the duonebs every 6 hours scheduled. Daughter states the pt has much improved mentation from yesterday.   Objective:   Vital Signs (last 24 hours): Vitals:   07/11/19 0721 07/11/19 0930 07/11/19 1207 07/11/19 1314  BP:  (!) 142/72 (!) 119/52 (!) 90/52  Pulse: 99 91 86 83  Resp: (!) 28 (!) 24 (!) 24 (!) 22  Temp:   98.5 F (36.9 C)   TempSrc:   Axillary   SpO2: 95% 96% 97% 96%  Weight:      Height:       Physical Exam: Physical Exam  Constitutional: She appears distressed.  Cardiovascular:  No LE edema   Pulmonary/Chest: She is in respiratory distress. She has wheezes.  Tachypneic; diffuse wheezes, no lungs sounds in the RUL  Neurological: She is alert.  Only oriented to self   BMP Latest Ref Rng & Units 07/11/2019 07/10/2019 07/10/2019  Glucose 70 - 99 mg/dL 104(H) 148(H) 118(H)  BUN 8 - 23 mg/dL 15 18 18   Creatinine 0.44 - 1.00 mg/dL 2.33(H) 3.11(H) 3.38(H)  Sodium 135 - 145 mmol/L 134(L) 134(L) 134(L)  Potassium 3.5 - 5.1 mmol/L 3.0(L) 3.3(L) 3.6  Chloride 98 - 111 mmol/L 105 106 105  CO2 22 - 32 mmol/L 18(L) 18(L) 18(L)  Calcium 8.9 - 10.3 mg/dL 7.6(L) 7.2(L) 7.5(L)   CBC Latest Ref Rng & Units 07/11/2019 07/10/2019 07/09/2019  WBC 4.0 - 10.5 K/uL 16.9(H) 13.1(H) 10.7(H)  Hemoglobin 12.0 - 15.0 g/dL 7.4(L) 7.2(L) 7.3(L)  Hematocrit 36.0 - 46.0 % 22.0(L) 22.0(L) 22.7(L)  Platelets 150 - 400 K/uL 443(H) 400 370    Assessment/Plan:   Principal Problem:   PRES (posterior  reversible encephalopathy syndrome) Active Problems:   Fever   AKI (acute kidney injury) ([place])   Hypercalcemia   Hypomagnesemia, chronic  Ms. Duggin is a 73 year old female with metastatic ovarian cancer on chemotherapy with paclitaxel/avastin and PE on Xarelto who presented with fever. Infectious workup unrevealing on admission. Due to decline in mentation we obtained an MRI concerning for PRES syndrome. Patient with slightly improved mentation since management of bp for PRES however patient has since developed dyspnea with imaging and labs consistent with a multifocal pnuemonia for which she's being broadly treated and an oliguric renal failure likely ischemic secondary to low bps.   # Fever with AMS: Blood cultures show NGTD.  UA within normal limits and urine culture shows no growth.  CT scan and MRI brain do not reveal source for infection.  * LP yesterday without evidence for infection. * Chest x-ray concerning for multifocal pneumonia. Repeat x-ray shows vascular congestion * Patient afebrile overnight with normal temperatures since hypothermia yesterday am * Trend fever curve and WBC  # Dyspnea/Pneumonia: Patient developed dyspnea while admitted, chest xray changed from admission and concerning for multifocal pneumonia, being treated with broad spectrum antibiotics; repeat x-ray concerning for vascular congestion * WBC up from 13.1 yesterday to 16.9 today * Broadened antibiotic coverage yesterday to linezolid, cefepime, azithro yesterday  *  Given worsening of her sx and WBC, consulted ID today, appreciate recs * Some concern for volume overload given patient is oliguric, weight is up, patient dyspneic and vascular congestion on x-ray; discussed with nephrologist Dr. Posey Pronto and we will give one dose 40 mg IV lasix; Dr. Posey Pronto dc'ed her fluids   # PRES Syndrome: Blood pressure well controlled on current regimen. PRES thought to be 2/2 bevacizumab.  * given kidney injury from ischemic ATN,  need to avoid hypotension per nephrology  * Continue amlodipine 10mg  QD; labetolol dc'ed for soft bps  # Oliguric Renal Failure secondary to ischemic ATN: Creatinine bumped up to 3.59 from baseline of 1.07, downtrending now. Urine studies yesterday demonstrated a FeNa of 1.4, consistent with an intrinsic renal etiology, possibly ischemic ATN with transient hypotension with BPs as low as 77/47 days prior.  * Nephro following and recommends supportive management, avoidance of hypotension and nephrotoxic agents * monitor ins and outs and creatinine   * fu nephro recs  # Anemia: Iron studies consistent with anemia of chronic disease. Hemoglobin stable at 7.4 today.  * Patient SOB but not lightheaded * Monitor with CBC. Type and Screened yesterday. Iron infusion yesterday.  Dispo: Anticipated discharge pending clinical improvement.   Al Decant, MD 07/11/2019, 1:43 PM Pager: [phone]

## 2019-07-11 NOTE — Plan of Care (Signed)
  Problem: Clinical Measurements: Goal: Diagnostic test results will improve Outcome: Progressing Goal: Cardiovascular complication will be avoided Outcome: Progressing   Problem: Activity: Goal: Risk for activity intolerance will decrease Outcome: Progressing   

## 2019-07-12 DIAGNOSIS — N19 Unspecified kidney failure: Secondary | ICD-10-CM

## 2019-07-12 LAB — CBC WITH DIFFERENTIAL/PLATELET
Abs Immature Granulocytes: 2.55 10*3/uL — ABNORMAL HIGH (ref 0.00–0.07)
Basophils Absolute: 0.2 10*3/uL — ABNORMAL HIGH (ref 0.0–0.1)
Basophils Relative: 1 %
Eosinophils Absolute: 0.2 10*3/uL (ref 0.0–0.5)
Eosinophils Relative: 1 %
HCT: 21.2 % — ABNORMAL LOW (ref 36.0–46.0)
Hemoglobin: 7.1 g/dL — ABNORMAL LOW (ref 12.0–15.0)
Immature Granulocytes: 14 %
Lymphocytes Relative: 14 %
Lymphs Abs: 2.5 10*3/uL (ref 0.7–4.0)
MCH: 33.2 pg (ref 26.0–34.0)
MCHC: 33.5 g/dL (ref 30.0–36.0)
MCV: 99.1 fL (ref 80.0–100.0)
Monocytes Absolute: 3 10*3/uL — ABNORMAL HIGH (ref 0.1–1.0)
Monocytes Relative: 17 %
Neutro Abs: 9.8 10*3/uL — ABNORMAL HIGH (ref 1.7–7.7)
Neutrophils Relative %: 53 %
Platelets: 451 10*3/uL — ABNORMAL HIGH (ref 150–400)
RBC: 2.14 MIL/uL — ABNORMAL LOW (ref 3.87–5.11)
RDW: 21.7 % — ABNORMAL HIGH (ref 11.5–15.5)
WBC: 18.2 10*3/uL — ABNORMAL HIGH (ref 4.0–10.5)
nRBC: 9.6 % — ABNORMAL HIGH (ref 0.0–0.2)

## 2019-07-12 LAB — COMPREHENSIVE METABOLIC PANEL
ALT: 18 U/L (ref 0–44)
AST: 24 U/L (ref 15–41)
Albumin: 2.1 g/dL — ABNORMAL LOW (ref 3.5–5.0)
Alkaline Phosphatase: 74 U/L (ref 38–126)
Anion gap: 9 (ref 5–15)
BUN: 13 mg/dL (ref 8–23)
CO2: 21 mmol/L — ABNORMAL LOW (ref 22–32)
Calcium: 7.6 mg/dL — ABNORMAL LOW (ref 8.9–10.3)
Chloride: 108 mmol/L (ref 98–111)
Creatinine, Ser: 1.65 mg/dL — ABNORMAL HIGH (ref 0.44–1.00)
GFR calc Af Amer: 35 mL/min — ABNORMAL LOW (ref >60)
GFR calc non Af Amer: 30 mL/min — ABNORMAL LOW (ref >60)
Glucose, Bld: 79 mg/dL (ref 70–99)
Potassium: 3.9 mmol/L (ref 3.5–5.1)
Sodium: 138 mmol/L (ref 135–145)
Total Bilirubin: 0.7 mg/dL (ref 0.3–1.2)
Total Protein: 5.4 g/dL — ABNORMAL LOW (ref 6.5–8.1)

## 2019-07-12 LAB — RESPIRATORY PANEL BY PCR

## 2019-07-12 LAB — MAGNESIUM: Magnesium: 1.5 mg/dL — ABNORMAL LOW (ref 1.7–2.4)

## 2019-07-12 LAB — HEPARIN LEVEL (UNFRACTIONATED)
Heparin Unfractionated: 0.91 IU/mL — ABNORMAL HIGH (ref 0.30–0.70)
Heparin Unfractionated: 0.76 IU/mL — ABNORMAL HIGH (ref 0.30–0.70)

## 2019-07-12 MED ORDER — MAGNESIUM OXIDE 400 (241.3 MG) MG PO TABS
800.0000 mg | ORAL_TABLET | Freq: Two times a day (BID) | ORAL | Status: DC
  Administered 2019-07-12 – 2019-07-17 (×11): 800 mg via ORAL
  Filled 2019-07-12 (×11): qty 2

## 2019-07-12 MED ORDER — SULFAMETHOXAZOLE-TRIMETHOPRIM 400-80 MG/5ML IV SOLN
10.0000 mg/kg/d | Freq: Four times a day (QID) | INTRAVENOUS | Status: DC
  Administered 2019-07-12 – 2019-07-15 (×12): 156.8 mg via INTRAVENOUS
  Filled 2019-07-12 (×14): qty 9.8

## 2019-07-12 MED ORDER — MAGNESIUM SULFATE 2 GM/50ML IV SOLN
2.0000 g | Freq: Once | INTRAVENOUS | Status: AC
  Administered 2019-07-12: 2 g via INTRAVENOUS
  Filled 2019-07-12: qty 50

## 2019-07-12 MED ORDER — HYDROMORPHONE HCL 1 MG/ML IJ SOLN
0.5000 mg | Freq: Once | INTRAMUSCULAR | Status: AC | PRN
  Administered 2019-07-13: 0.5 mg via INTRAVENOUS
  Filled 2019-07-12: qty 0.5

## 2019-07-12 MED ORDER — FOLIC ACID 5 MG/ML IJ SOLN
1.0000 mg | Freq: Every day | INTRAMUSCULAR | Status: DC
  Administered 2019-07-12: 1 mg via INTRAVENOUS
  Filled 2019-07-12 (×2): qty 0.2

## 2019-07-12 NOTE — Progress Notes (Signed)
Occupational Therapy Treatment Patient Details Name: Rhonda Hale MRN: [id number] DOB: 04-05-1946 Today's Date: 07/12/2019    History of present illness Rhonda Hale is a 73 y.o. woman with PMHx significant for ovarian cancer status post hysterectomy and splenectomy, on chemotherapy, presented to ED with fever and chills, maximum temperature 103F, and nontraumatic fall at home prior to arrival. Associated symptoms include decreased appetite, decreased oral intake, fatigue and weakness for two days.    OT comments  Pt progressing toward established goals. Pt's daughter reports pt appears to demonstrate increased anxiety with movement, began session with Progressive Muscle Relaxation video, pt reported she felt calm and ready for transfer. Pt currently requires maxA+2 (assistance from therapy tech) for bed mobility, modA for LB dressing, and minA for simulated toilet transfer from EOB to recliner. At rest pt SpO2 95% 3L HFNC. Pt briefly off supplemental O2 to transfer to the chair, on RA SpO2 dropped to 83% educated pt on pursed lip breathing, back on 3L HFNC SpO2 92%. Pt will continue to benefit from skilled OT services to maximize safety and independence with ADL/IADL and functional mobility. Will continue to follow acutely and progress as tolerated.     Follow Up Recommendations  Home health OT;Supervision/Assistance - 24 hour    Equipment Recommendations  3 in 1 bedside commode    Recommendations for Other Services      Precautions / Restrictions Precautions Precautions: Fall Restrictions Weight Bearing Restrictions: No       Mobility Bed Mobility Overal bed mobility: Needs Assistance Bed Mobility: Supine to Sit     Supine to sit: Max assist;+2 for physical assistance     General bed mobility comments: maxA for trunk and BLE management  Transfers Overall transfer level: Needs assistance Equipment used: 1 person hand held assist Transfers: Sit to/from [employer] Sit to Stand: Min assist Stand pivot transfers: Min assist       General transfer comment: minA for sit<>stand from EOB and stand-pivot to recliner    Balance Overall balance assessment: Needs assistance Sitting-balance support: No upper extremity supported;Feet supported Sitting balance-Leahy Scale: Fair Sitting balance - Comments: able to tolerate sitting EOB for ~34min   Standing balance support: No upper extremity supported Standing balance-Leahy Scale: Poor Standing balance comment: reliant on external support for stability                            ADL either performed or assessed with clinical judgement   ADL Overall ADL's : Needs assistance/impaired Eating/Feeding: Set up;Sitting   Grooming: Set up;Sitting   Upper Body Bathing: Set up;Sitting           Lower Body Dressing: Moderate assistance;Minimal assistance;Sit to/from stand Lower Body Dressing Details (indicate cue type and reason): assist to don socks;minA for sit<>stand Toilet Transfer: Minimal assistance;Stand-pivot Toilet Transfer Details (indicate cue type and reason): stand pivot from EOB to recliner with minA Toileting- Clothing Manipulation and Hygiene: Minimal assistance;Sit to/from stand       Functional mobility during ADLs: Minimal assistance General ADL Comments: pt sat EOB ~5 min; Pt required minA for stand-pivot from EOB to recliner pt on RA spO2 dropped to 83%, SpO2 92%-95% on 3Lnc     Vision       Perception     Praxis      Cognition Arousal/Alertness: Lethargic;Awake/alert Behavior During Therapy: WFL for tasks assessed/performed Overall Cognitive Status: Within Functional Limits for tasks assessed  General Comments: pt required max vc to keep eyes open during session;pts daughter reports pt demonstrates increased anxiety with movement;began session with 78min Progressive Muscle Relaxation video, pt reported she  felt calm and ready for transfer        Exercises     Shoulder Instructions       General Comments pt on RA during transfer SpO2 dropped to 88% educated pt on pursed lip breathing, required supplemental o2 to return Spo2 >94%    Pertinent Vitals/ Pain       Pain Assessment: No/denies pain  Home Living                                          Prior Functioning/Environment              Frequency  Min 2X/week        Progress Toward Goals  OT Goals(current goals can now be found in the care plan section)  Progress towards OT goals: Progressing toward goals  Acute Rehab OT Goals Patient Stated Goal: go home OT Goal Formulation: With patient Time For Goal Achievement: 07/18/19 Potential to Achieve Goals: Good ADL Goals Pt Will Perform Lower Body Bathing: with modified independence;with adaptive equipment;sit to/from stand Pt Will Perform Lower Body Dressing: with modified independence;with adaptive equipment;sit to/from stand Pt Will Transfer to Toilet: with modified independence;ambulating;bedside commode Additional ADL Goal #1: Pt will be able to identify and implement at least 3 energy conservation strategies during ADLs.  Plan Discharge plan remains appropriate    Co-evaluation                 AM-PAC OT "6 Clicks" Daily Activity     Outcome Measure   Help from another person eating meals?: A Little Help from another person taking care of personal grooming?: A Little Help from another person toileting, which includes using toliet, bedpan, or urinal?: A Little Help from another person bathing (including washing, rinsing, drying)?: A Little Help from another person to put on and taking off regular upper body clothing?: A Little Help from another person to put on and taking off regular lower body clothing?: A Little 6 Click Score: 18    End of Session Equipment Utilized During Treatment: Gait belt;Oxygen  OT Visit Diagnosis: Muscle  weakness (generalized) (M62.81)   Activity Tolerance Patient tolerated treatment well   Patient Left in chair;with call bell/phone within reach;with chair alarm set;with family/visitor present   Nurse Communication Mobility status        Time: 1443-1511 OT Time Calculation (min): 28 min  Charges: OT General Charges $OT Visit: 1 Visit OT Treatments $Self Care/Home Management : 23-37 mins  Dorinda Hill OTR/L Acute Rehabilitation Services Office: [place] 07/12/2019, 3:25 PM

## 2019-07-12 NOTE — Progress Notes (Addendum)
Patient ID: Rhonda Hale, female   DOB: Aug 23, 1946, 73 y.o.   MRN: [id number] [facility] Progress Note   Assessment/ Plan:   1.  Acute kidney injury on chronic kidney disease stage III:  Suspected ATN with transient hypotension that appears to be improving with conservative management/blood pressure support.  Potential nephrotoxic medications including acyclovir discontinued.  Decent urine output overnight with improving BUN and creatinine overnight.  I anticipate a rise of her creatinine and possibly potassium with Bactrim-monitor for this. 2.  Hyponatremia:  Stable/unchanged, possibly paraneoplastic and aggravated by hypotonic fluids.  Improving with renal recovery. 3.  Anion gap metabolic acidosis:  Improving with renal recovery.  4.  Fever/encephalopathy: Treated empirically for pneumonia and evaluated for viral meningitis/encephalitis and currently on narrowed antimicrobial therapy with cefepime and Bactrim.  MRI of the brain done 4 days ago shows features of posterior reversible encephalopathy syndrome likely associated with bevacizumab. 5.  Metastatic ovarian cancer: Recently on treatment with paclitaxel and Avastin. 6.  Anemia of chronic illness/malignancy:  Status post intravenous iron for iron deficiency.  Avoid ESA with metastatic cancer. 7.  Hypomagnesemia: Begin supplemental oral magnesium following intravenous loading.  I will sign off at this time and remain available for questions or concerns.  I anticipate creatinine rise after being started on Bactrim due to its effects on creatinine secretion as you are aware of.  Subjective:   Some shortness of breath this morning requiring nebulizer.  Daughter and RN at bedside.   Objective:   BP (!) 146/62   Pulse 83   Temp 98.1 F (36.7 C) (Oral)   Resp (!) 23   Ht 5\' 2"  (1.575 m)   Wt 81.5 kg   SpO2 92%   BMI 32.86 kg/m   Intake/Output Summary (Last 24 hours) at 07/12/2019 1019 Last data filed at 07/12/2019  0800 Gross per 24 hour  Intake 1542.35 ml  Output 2000 ml  Net -457.65 ml   Weight change:   Physical Exam: Gen: Resting comfortably in bed, daughter and RN at bedside. CVS: Pulse regular rhythm, normal rate, S1 and S2 normal Resp: Anteriorly coarse breath sounds bilaterally, no distinct rales Abd: Soft, obese, nontender Ext: No lower extremity edema  Imaging: Ct Chest Wo Contrast  Result Date: 07/11/2019 CLINICAL DATA:  Pneumonia EXAM: CT CHEST WITHOUT CONTRAST TECHNIQUE: Multidetector CT imaging of the chest was performed following the standard protocol without IV contrast. COMPARISON:  None. FINDINGS: Cardiovascular: Left coronary artery calcifications. Normal heart size. No pericardial effusion. Right chest port catheter. Mediastinum/Nodes: No enlarged mediastinal, hilar, or axillary lymph nodes. Calcified mediastinal and hilar lymph nodes. Thyroid gland, trachea, and esophagus demonstrate no significant findings. Lungs/Pleura: There is extensive bilateral irregular ground-glass and consolidative opacity. Small bilateral pleural effusions with associated atelectasis or consolidation. Evidence of prior wedge resection of the left upper lobe. Upper Abdomen: No acute abnormality. Musculoskeletal: No chest wall mass or suspicious bone lesions identified. IMPRESSION: 1. There is extensive bilateral irregular ground-glass and consolidative opacity. Small bilateral pleural effusions with associated atelectasis or consolidation. Findings are most consistent with multifocal infection. Although no discrete pulmonary nodules are identified, metastatic disease is not strictly excluded given history of ovarian malignancy. 2.  Evidence of prior wedge resection of the left upper lobe. 3.  Coronary artery disease. Electronically Signed   By: Eddie Candle M.D.   On: 07/11/2019 13:26    Labs: BMET Recent Labs  Lab 07/09/19 0429 07/09/19 1501 07/10/19 0022 07/10/19 0452 07/10/19 1451 07/11/19 0351  07/12/19  0433  NA 134* 132* 133* 134* 134* 134* 138  K 3.4* 3.8 3.9 3.6 3.3* 3.0* 3.9  CL 103 103 105 105 106 105 108  CO2 20* 18* 19* 18* 18* 18* 21*  GLUCOSE 94 240* 124* 118* 148* 104* 79  BUN 12 15 18 18 18 15 13   CREATININE 2.63* 3.22* 3.59* 3.38* 3.11* 2.33* 1.65*  CALCIUM 7.1* 6.8* 7.7* 7.5* 7.2* 7.6* 7.6*   CBC Recent Labs  Lab 07/09/19 0429 07/09/19 1501 07/10/19 0452 07/11/19 0351 07/12/19 0433  WBC 10.6* 10.7* 13.1* 16.9* 18.2*  NEUTROABS 4.7  --  10.1* 9.8* 9.8*  HGB 7.6* 7.3* 7.2* 7.4* 7.1*  HCT 23.3* 22.7* 22.0* 22.0* 21.2*  MCV 99.1 102.3* 98.7 99.5 99.1  PLT 385 370 400 443* 451*    Medications:    . amLODipine  10 mg Oral Daily  . calcium-vitamin D  1 tablet Oral BID  . feeding supplement  1 Container Oral TID BM  . feeding supplement (PRO-STAT SUGAR FREE 64)  30 mL Oral BID WC  . labetalol  50 mg Oral BID  . levothyroxine  137 mcg Oral QAC breakfast  . multivitamin with minerals  1 tablet Oral Daily  . potassium chloride  20 mEq Oral BID  . sodium chloride flush  3 mL Intravenous Q12H   Elmarie Shiley, MD 07/12/2019, 10:19 AM

## 2019-07-12 NOTE — Progress Notes (Addendum)
ANTICOAGULATION CONSULT NOTE - [facility] for Heparin Indication: pulmonary embolus  Allergies  Allergen Reactions  . Atorvastatin Other (See Comments)    Severe heartburn, reflux    Patient Measurements: Height: 5\' 2"  (157.5 cm) Weight: 179 lb 10.8 oz (81.5 kg) IBW/kg (Calculated) : 50.1 Heparin Dosing Weight: 67.4 kg  Vital Signs: Temp: 97.9 F (36.6 C) (07/31 1215) Temp Source: Oral (07/31 1215) BP: 112/59 (07/31 1215) Pulse Rate: 76 (07/31 1215)  Labs: Recent Labs    07/09/19 1831  07/10/19 0452  07/10/19 1451 07/11/19 0351 07/12/19 0433 07/12/19 1356  HGB  --    < > 7.2*  --   --  7.4* 7.1*  --   HCT  --   --  22.0*  --   --  22.0* 21.2*  --   PLT  --   --  400  --   --  443* 451*  --   APTT 44*  --   --   --   --   --   --   --   HEPARINUNFRC <0.10*   < >  --    < >  --  0.61 0.91* 0.76*  CREATININE  --    < > 3.38*  --  3.11* 2.33* 1.65*  --    < > = values in this interval not displayed.    Estimated Creatinine Clearance: 29.2 mL/min (A) (by C-G formula based on SCr of 1.65 mg/dL (H)).  Assessment: 73 yr old female with hx of PE, on Xarelto 20 mg/day at home (last dose of Xarelto while inpt was on 07/06/19, in prep for LP and held for unstable renal function). Pharmacy is consulted to dose heparin Initial heparin level <0.1 shows any anti-Xa activity from Xarelto unlikely.  Heparin level on 7/31 is supra-therapeutic at 0.76 on 950 units/hr after dose was decreased from 1100 units/hr d/t heparin level of 0.91. CBC is still stable with Hgb 7.1, Hct 21.2, and plts of 451. No s/sx bleeding or infusion issues per nursing.   Of note, pt has AKI, with Scr trending down (from 2.33 yesterday to 1.65 today) Other medical hx includes: ovarian cancer (S/P hysterectomy, splenectomy, on chemo), fever with AMS, PRESS, hypercalcemia, anemia.   Goal of Therapy:  Heparin level 0.3-0.7 units/ml Monitor platelets by anticoagulation protocol: Yes    Plan:  -Decrease heparin to 850 units/hr -Check heparin level in 8 hours -Monitor for s/sx of bleeding  -Monitor renal function to transition back to Xarelto as appropriate.     Thank you,   Eddie Candle, PharmD PGY-1 Pharmacy Resident  Phone: [phone]

## 2019-07-12 NOTE — Progress Notes (Signed)
[facility] for Infectious Disease   Reason for visit: Follow up on fever, hypothermia, pneumonia,   Interval History:  Patient afebrile overnight, WBC of 18.2 trending up from 16.9. On day 3 of cefepime, rapid repeat COVID negative yesterday. Blood cultures from 7/29 show no growth at < 24 hours. In speaking with patient this AM, patient denies fever, chills, nausea, vomiting, or pain. Patient was AOx2 stating her name and location. She was unable to recall the year - patient appears to have word finding difficulty and recognizes correct answer to questions when stated. Patient was saturating at 92-94% off 3 L [place]. While on 3 L [place], patient had episode of coughing during which patient maintained saturation initially. Towards end of coughing episode, patient became anxious and experienced desat to 87%.  Impression: 73 year old female with CT findings concerning for viral vs. bacterial pneumonia. Patient afebrile over past 24 hours with improvement in mental status. Due to desaturation and increased WBC there is some suspicion for pneumocystis infection.   Plan:  1. Will continue cefepime to cover for hospital acquired pneumonia, although this may be a viral pneumonia 2. Will treat for pneumocystis pneumonia with bactrim and prednisolone taper   Physical Exam: Constitutional: In NAD Vitals:   07/12/19 0715 07/12/19 1014  BP: 140/76 (!) 146/62  Pulse: 83 83  Resp: (!) 23   Temp: 98.1 F (36.7 C)   SpO2: 92%   Respiratory: Normal respiratory effort; CTA B Cardiovascular: RRR GI: soft, nt, nd  Review of Systems: Negative for fever, chills, nausea, vomiting, chest pain, SOB  Lab Results  Component Value Date   WBC 18.2 (H) 07/12/2019   HGB 7.1 (L) 07/12/2019   HCT 21.2 (L) 07/12/2019   MCV 99.1 07/12/2019   PLT 451 (H) 07/12/2019    Lab Results  Component Value Date   CREATININE 1.65 (H) 07/12/2019   BUN 13 07/12/2019   NA 138 07/12/2019   K 3.9 07/12/2019   CL 108  07/12/2019   CO2 21 (L) 07/12/2019    Lab Results  Component Value Date   ALT 18 07/12/2019   AST 24 07/12/2019   ALKPHOS 74 07/12/2019     Microbiology: Recent Results (from the past 240 hour(s))  Blood Culture (routine x 2)     Status: None   Collection Time: 07/03/19 10:00 AM   Specimen: BLOOD RIGHT HAND  Result Value Ref Range Status   Specimen Description BLOOD RIGHT HAND  Final   Special Requests   Final    BOTTLES DRAWN AEROBIC AND ANAEROBIC Blood Culture adequate volume   Culture   Final    NO GROWTH 5 DAYS Performed at [facility], [address], [place], [place] [address]    Report Status 07/08/2019 FINAL  Final  Blood Culture (routine x 2)     Status: None   Collection Time: 07/03/19 10:07 AM   Specimen: BLOOD RIGHT WRIST  Result Value Ref Range Status   Specimen Description BLOOD RIGHT WRIST  Final   Special Requests   Final    BOTTLES DRAWN AEROBIC AND ANAEROBIC Blood Culture results may not be optimal due to an inadequate volume of blood received in culture bottles   Culture   Final    NO GROWTH 5 DAYS Performed at [facility], [place] [address], [place], [place] [address]    Report Status 07/08/2019 FINAL  Final  SARS Coronavirus 2 (CEPHEID- Performed in [facility] hospital lab),  Hosp Order     Status: None   Collection Time: 07/03/19 10:13 AM   Specimen: Nasopharyngeal Swab  Result Value Ref Range Status   SARS Coronavirus 2 NEGATIVE NEGATIVE Final    Comment: (NOTE) If result is NEGATIVE SARS-CoV-2 target nucleic acids are NOT DETECTED. The SARS-CoV-2 RNA is generally detectable in upper and lower  respiratory specimens during the acute phase of infection. The lowest  concentration of SARS-CoV-2 viral copies this assay can detect is 250  copies / mL. A negative result does not preclude SARS-CoV-2 infection  and should not be used as the sole basis for treatment or other  patient management decisions.  A negative result may occur with   improper specimen collection / handling, submission of specimen other  than nasopharyngeal swab, presence of viral mutation(s) within the  areas targeted by this assay, and inadequate number of viral copies  (<250 copies / mL). A negative result must be combined with clinical  observations, patient history, and epidemiological information. If result is POSITIVE SARS-CoV-2 target nucleic acids are DETECTED. The SARS-CoV-2 RNA is generally detectable in upper and lower  respiratory specimens dur ing the acute phase of infection.  Positive  results are indicative of active infection with SARS-CoV-2.  Clinical  correlation with patient history and other diagnostic information is  necessary to determine patient infection status.  Positive results do  not rule out bacterial infection or co-infection with other viruses. If result is PRESUMPTIVE POSTIVE SARS-CoV-2 nucleic acids MAY BE PRESENT.   A presumptive positive result was obtained on the submitted specimen  and confirmed on repeat testing.  While 2019 novel coronavirus  (SARS-CoV-2) nucleic acids may be present in the submitted sample  additional confirmatory testing may be necessary for epidemiological  and / or clinical management purposes  to differentiate between  SARS-CoV-2 and other Sarbecovirus currently known to infect humans.  If clinically indicated additional testing with an alternate test  methodology [phone]) is advised. The SARS-CoV-2 RNA is generally  detectable in upper and lower respiratory sp ecimens during the acute  phase of infection. The expected result is Negative. Fact Sheet for Patients:  StrictlyIdeas.no Fact Sheet for Healthcare Providers: BankingDealers.co.za This test is not yet approved or cleared by the [place] FDA and has been authorized for detection and/or diagnosis of SARS-CoV-2 by FDA under an Emergency Use Authorization (EUA).  This EUA will  remain in effect (meaning this test can be used) for the duration of the COVID-19 declaration under Section 564(b)(1) of the Act, 21 U.S.C. section 360bbb-3(b)(1), unless the authorization is terminated or revoked sooner. Performed at [facility], [place] [address] [place], [place] [address]   Urine culture     Status: None   Collection Time: 07/03/19  4:11 PM   Specimen: In/Out Cath Urine  Result Value Ref Range Status   Specimen Description IN/OUT CATH URINE  Final   Special Requests NONE  Final   Culture   Final    NO GROWTH Performed at [facility], [place] [address] [place], [place] [address]    Report Status 07/04/2019 FINAL  Final  MRSA PCR Screening     Status: None   Collection Time: 07/04/19  7:48 AM   Specimen: Nasal Mucosa; Nasopharyngeal  Result Value Ref Range Status   MRSA by PCR NEGATIVE NEGATIVE Final    Comment:        The GeneXpert MRSA Assay (FDA approved for NASAL specimens only), is one component  of a comprehensive MRSA colonization surveillance program. It is not intended to diagnose MRSA infection nor to guide or monitor treatment for MRSA infections. Performed at [facility], [place] [address] [place], [place] [address]   Culture, blood (routine x 2)     Status: None   Collection Time: 07/05/19  7:12 AM   Specimen: BLOOD  Result Value Ref Range Status   Specimen Description BLOOD RIGHT ANTECUBITAL  Final   Special Requests   Final    BOTTLES DRAWN AEROBIC AND ANAEROBIC Blood Culture adequate volume   Culture   Final    NO GROWTH 5 DAYS Performed at [facility], [place] [address], [place], [place] [address]    Report Status 07/10/2019 FINAL  Final  Culture, blood (routine x 2)     Status: None   Collection Time: 07/05/19  7:17 AM   Specimen: BLOOD RIGHT HAND  Result Value Ref Range Status   Specimen Description BLOOD RIGHT HAND  Final   Special Requests   Final    BOTTLES DRAWN AEROBIC ONLY Blood Culture adequate volume    Culture   Final    NO GROWTH 5 DAYS Performed at [facility], [place] [address], [place], [place] [address]    Report Status 07/10/2019 FINAL  Final  Culture, blood (routine x 2)     Status: None   Collection Time: 07/06/19  3:08 AM   Specimen: BLOOD RIGHT HAND  Result Value Ref Range Status   Specimen Description BLOOD RIGHT HAND  Final   Special Requests   Final    BOTTLES DRAWN AEROBIC AND ANAEROBIC Blood Culture adequate volume   Culture   Final    NO GROWTH 5 DAYS Performed at [facility], [place] [address], [place], [place] [address]    Report Status 07/11/2019 FINAL  Final  Anaerobic culture     Status: None (Preliminary result)   Collection Time: 07/08/19  3:16 PM   Specimen: CSF; Cerebrospinal Fluid  Result Value Ref Range Status   Specimen Description CSF  Final   Special Requests   Final    NONE Performed at [facility], [place] [address], [place], [place] [address]    Gram Stain PENDING  Incomplete   Culture   Final    NO ANAEROBES ISOLATED; CULTURE IN PROGRESS FOR 5 DAYS   Report Status PENDING  Incomplete  CSF culture     Status: None   Collection Time: 07/08/19  3:16 PM   Specimen: CSF; Cerebrospinal Fluid  Result Value Ref Range Status   Specimen Description CSF  Final   Special Requests NONE  Final   Gram Stain   Final    WBC PRESENT, PREDOMINANTLY MONONUCLEAR NO ORGANISMS SEEN CYTOSPIN SMEAR    Culture   Final    NO GROWTH 3 DAYS Performed at [facility], [place] [address] [place] [address] [place], [place] [address]    Report Status 07/11/2019 FINAL  Final  Fungus Culture With Stain     Status: None (Preliminary result)   Collection Time: 07/08/19  3:16 PM   Specimen: CSF; Cerebrospinal Fluid  Result Value Ref Range Status   Fungus Stain Final report  Final    Comment: (NOTE) Performed At: [facility] [place], [place] [id number] Rush Farmer MD [id number]    Fungus (Mycology) Culture PENDING  Incomplete   Fungal  Source CSF  Final    Comment: Performed at [facility], [place] [address], [place], [place] [address]  Fungus Culture Result     Status: None   Collection Time: 07/08/19  3:16 PM  Result Value Ref Range Status   Result 1 Comment  Final    Comment: (NOTE) KOH/Calcofluor preparation:  no fungus observed. Performed At: [facility] [place], [place] [id number] Rush Farmer MD [id number]   Culture, Urine     Status: Abnormal   Collection Time: 07/10/19  9:08 AM   Specimen: Urine, Random  Result Value Ref Range Status   Specimen Description URINE, RANDOM  Final   Special Requests NONE  Final   Culture (A)  Final    <10,000 COLONIES/mL INSIGNIFICANT GROWTH Performed at [facility], [address], [place], [place] [address]    Report Status 07/11/2019 FINAL  Final  Culture, blood (routine x 2)     Status: None (Preliminary result)   Collection Time: 07/10/19 10:21 AM   Specimen: BLOOD RIGHT HAND  Result Value Ref Range Status   Specimen Description BLOOD RIGHT HAND  Final   Special Requests   Final    BOTTLES DRAWN AEROBIC AND ANAEROBIC Blood Culture adequate volume   Culture   Final    NO GROWTH < 24 HOURS Performed at [facility], [place] [address] [place], [place] [address]    Report Status PENDING  Incomplete  Culture, blood (routine x 2)     Status: None (Preliminary result)   Collection Time: 07/10/19 10:41 AM   Specimen: BLOOD LEFT HAND  Result Value Ref Range Status   Specimen Description BLOOD LEFT HAND  Final   Special Requests   Final    BOTTLES DRAWN AEROBIC AND ANAEROBIC Blood Culture results may not be optimal due to an inadequate volume of blood received in culture bottles   Culture   Final    NO GROWTH < 24 HOURS Performed at [facility], [place] [address] [place],  [address]    Report Status PENDING  Incomplete  SARS Coronavirus 2 [facility] order, Performed in [facility] hospital lab)     Status: None    Collection Time: 07/11/19  4:39 PM   Specimen: Nasopharyngeal Swab  Result Value Ref Range Status   SARS Coronavirus 2 NEGATIVE NEGATIVE Final    Comment: (NOTE) If result is NEGATIVE SARS-CoV-2 target nucleic acids are NOT DETECTED. The SARS-CoV-2 RNA is generally detectable in upper and lower  respiratory specimens during the acute phase of infection. The lowest  concentration of SARS-CoV-2 viral copies this assay can detect is 250  copies / mL. A negative result does not preclude SARS-CoV-2 infection  and should not be used as the sole basis for treatment or other  patient management decisions.  A negative result may occur with  improper specimen collection / handling, submission of specimen other  than nasopharyngeal swab, presence of viral mutation(s) within the  areas targeted by this assay, and inadequate number of viral copies  (<250 copies / mL). A negative result must be combined with clinical  observations, patient history, and epidemiological information. If result is POSITIVE SARS-CoV-2 target nucleic acids are DETECTED. The SARS-CoV-2 RNA is generally detectable in upper and lower  respiratory specimens dur ing the acute phase of infection.  Positive  results are indicative of active infection with SARS-CoV-2.  Clinical  correlation with patient history and other diagnostic information is  necessary to determine patient infection status.  Positive results do  not rule out bacterial infection or co-infection with other viruses. If result is PRESUMPTIVE POSTIVE SARS-CoV-2 nucleic acids MAY BE PRESENT.   A presumptive positive result was obtained on the submitted specimen  and confirmed on repeat testing.  While 2019 novel coronavirus  (SARS-CoV-2) nucleic acids may be present in the submitted sample  additional confirmatory testing may be necessary for epidemiological  and / or clinical management purposes  to differentiate between  SARS-CoV-2 and other Sarbecovirus  currently known to infect humans.  If clinically indicated additional testing with an alternate test  methodology [id number]) is advised. The SARS-CoV-2 RNA is generally  detectable in upper and lower respiratory sp ecimens during the acute  phase of infection. The expected result is Negative. Fact Sheet for Patients:  StrictlyIdeas.no Fact Sheet for Healthcare Providers: BankingDealers.co.za This test is not yet approved or cleared by the [place] FDA and has been authorized for detection and/or diagnosis of SARS-CoV-2 by FDA under an Emergency Use Authorization (EUA).  This EUA will remain in effect (meaning this test can be used) for the duration of the COVID-19 declaration under Section 564(b)(1) of the Act, 21 U.S.C. section 360bbb-3(b)(1), unless the authorization is terminated or revoked sooner. Performed at [facility], [place] [address], [place], [place] [address]     Jeanmarie Hubert, MD. PGY1 07/12/2019, 12:39 PM

## 2019-07-12 NOTE — Progress Notes (Addendum)
Pt resting in bed - daughter got her back to bed. Discussed plan for crushing pills in yogurt for probiotics. Pt expressed dislike of greek yogurt - will attempt to mix with applesauce or others to get probiotic benefits and palatability for patient. No other needs voiced at this time.  1730 - Pt had another episode of coughing, feeling short of breath, had to sit up on the edge of the bed and breathe. SpO2 level never below 90% that I saw. Pt recovered, now resting. I paged on call MD about previous doses of IV Morphine for air hunger and if we can have a dose prn ready. MD called back, said they would place orders

## 2019-07-12 NOTE — Progress Notes (Signed)
  Date: 07/12/2019  Patient name: Rhonda Hale  Medical record number: [id number]  Date of birth: [birth year]   I have seen and evaluated this patient and I have discussed the plan of care with the house staff. Please see their note for complete details. I concur with their findings with the following additions/corrections: [name] was seen this morning on team rounds.  Her daughter is at the bedside.  The daughter confirms that mentally, she is still almost at baseline and that her respiratory status has improved significantly compared to yesterday.  This is not due to additional doses of morphine because overall improvement and less anxiety.  1.  Fever - last abnormal temperature was on the 28th when she was 94.8.  Subsequently her T-max was 100.3 on the 29th at 9 AM.  The only known source is multifocal pneumonia.  ID has narrowed antibiotics to meropenem but has added Bactrim for possible PCP although it is low on the differential.  No other source of infection has been identified.  2.  Acute hypoxic respiratory failure - this is most likely secondary to multifocal pneumonia.  We have no culture data. Repeat cultures was negative.   She got 40 of IV furosemide on the 30th for possible pulmonary edema and responded well to it.  She required morphine for air hunger on the morning of the 30th.  Bactrim has been added for possible PCP.  Since PCP is low on the differential but possible, prednisone has not been added.  If she is able to ambulate and does not desat, ID recommends we stop the Bactrim.  3. PRES - her risk factor was use of bevacizumab. Mental status is almost at baseline.  Blood pressure is stable with amlodipine, beta-blocker.  She has not had any known seizure activity.  4.  Oliguric renal failure - 2/2 suspected ATN from transient hypotension.  Possibly also a component from acyclovir which has been stopped.  Creatinine significantly improved and today 1.65.  Due to starting Bactrim,  she likely will have a bump in her creatinine.  Bartholomew Crews, MD 07/12/2019, 2:37 PM

## 2019-07-12 NOTE — Plan of Care (Signed)
Pt feeling overall better today, but had one incident of air hunger: now has a prn Dilaudid once. Starting to crush meds in yogurt for probiotic effect due to all the antibiotics pt has received.   Problem: Education: Goal: Knowledge of General Education information will improve Description: Including pain rating scale, medication(s)/side effects and non-pharmacologic comfort measures Outcome: Progressing   Problem: Health Behavior/Discharge Planning: Goal: Ability to manage health-related needs will improve Outcome: Progressing   Problem: Clinical Measurements: Goal: Ability to maintain clinical measurements within normal limits will improve Outcome: Progressing Goal: Will remain free from infection Outcome: Progressing Goal: Diagnostic test results will improve Outcome: Progressing Goal: Respiratory complications will improve Outcome: Progressing Goal: Cardiovascular complication will be avoided Outcome: Progressing   Problem: Activity: Goal: Risk for activity intolerance will decrease Outcome: Progressing   Problem: Nutrition: Goal: Adequate nutrition will be maintained Outcome: Progressing   Problem: Coping: Goal: Level of anxiety will decrease Outcome: Progressing   Problem: Elimination: Goal: Will not experience complications related to bowel motility Outcome: Progressing Goal: Will not experience complications related to urinary retention Outcome: Progressing   Problem: Pain Managment: Goal: General experience of comfort will improve Outcome: Progressing   Problem: Safety: Goal: Ability to remain free from injury will improve Outcome: Progressing   Problem: Skin Integrity: Goal: Risk for impaired skin integrity will decrease Outcome: Progressing

## 2019-07-12 NOTE — Progress Notes (Signed)
Pharmacy called re: heparin level - I've seen no s/s bleeding, no change in mental status, no bruising, no blood in Foley, etc. Rate changed per [facility] order.  OT also got patient up in to chair: they had to take patient off O2 for a brief period and her SpO2 was 83% (see flowsheet). Back on 3L HFNC, she was 92%.

## 2019-07-12 NOTE — Progress Notes (Signed)
[place] for Heparin Indication: pulmonary embolus  Allergies  Allergen Reactions  . Atorvastatin Other (See Comments)    Severe heartburn, reflux    Patient Measurements: Height: 5\' 2"  (157.5 cm) Weight: 179 lb 10.8 oz (81.5 kg) IBW/kg (Calculated) : 50.1 Heparin Dosing Weight: 67.4 kg  Vital Signs: Temp: 98 F (36.7 C) (07/31 0314) Temp Source: Oral (07/31 0314) BP: 121/58 (07/31 0314) Pulse Rate: 82 (07/31 0314)  Labs: Recent Labs    07/09/19 1501 07/09/19 1831  07/10/19 0452 07/10/19 1021 07/10/19 1451 07/11/19 0351 07/12/19 0433  HGB 7.3*  --   --  7.2*  --   --  7.4* 7.1*  HCT 22.7*  --   --  22.0*  --   --  22.0* 21.2*  PLT 370  --   --  400  --   --  443* 451*  APTT  --  44*  --   --   --   --   --   --   HEPARINUNFRC  --  <0.10*   < >  --  0.49  --  0.61 0.91*  CREATININE 3.22*  --    < > 3.38*  --  3.11* 2.33*  --   TROPONINIHS 18*  --   --   --   --   --   --   --    < > = values in this interval not displayed.    Estimated Creatinine Clearance: 20.6 mL/min (A) (by C-G formula based on SCr of 2.33 mg/dL (H)).  Assessment: 73 y.o. female with h/o PE, Xarelto on hold, for heparin  Goal of Therapy:  Heparin level 0.3-0.7 units/ml Monitor platelets by anticoagulation protocol: Yes   Plan:  Decrease Heparin 950 units/hr Check heparin level in 8 hours.   Phillis Knack, PharmD, BCPS

## 2019-07-12 NOTE — Progress Notes (Signed)
SLP Cancellation Note  Patient Details Name: Rhonda Hale MRN: [id number] DOB: 09-25-1946   Cancelled treatment:       Reason Eval/Treat Not Completed: Other (comment)(patient tolerating solids, thins, no f/u). SLP spoke with patient and daughter. She is slowly introducing solids with thin liquids, but continues with decreased appetite and soreness on tongue. SLP to s/o at this time but please reorder if needed. Thank you!   [name] 07/12/2019, 4:57 PM   [name], MA, CCC-SLP Speech Therapy [facility] Acute Rehab Pager: [phone]

## 2019-07-12 NOTE — Care Management Important Message (Signed)
Important Message  Patient Details  Name: Rhonda Hale MRN: [id number] Date of Birth: [birth year]   Medicare Important Message Given:  Yes     Buffey Zabinski Montine Circle 07/12/2019, 3:35 PM

## 2019-07-12 NOTE — Progress Notes (Signed)
Subjective:  Patient feeling better this AM. No SHOB, cough, or pain. Daughter at bedside states that her breathing seems better but she did have a eventful night. She soiled her bedsheets overnight. Overall the patient and her daughter think she is doing much better. She is alert to self but not time. Daughter states that her mental status is very close to baseline. We discussed the current treatment plan and coordination of care with ID. All questions and concerns addressed.   Objective:   Vital Signs (last 24 hours): Vitals:   07/11/19 2012 07/11/19 2314 07/12/19 0033 07/12/19 0314  BP: 133/62 (!) 128/57 (!) 114/52 (!) 121/58  Pulse: 77 82 76 82  Resp: (!) 24 (!) 24 (!) 24 (!) 26  Temp: (!) 97.5 F (36.4 C) 98.3 F (36.8 C) 98.4 F (36.9 C) 98 F (36.7 C)  TempSrc: Axillary Oral Axillary Oral  SpO2: 94% 93% 91% 93%  Weight:      Height:       Physical Exam: Physical Exam  Constitutional: No distress.  Laying calmly in bed  Cardiovascular: Normal rate, regular rhythm and normal heart sounds.  No murmur heard. No LE edema   Pulmonary/Chest: No respiratory distress.  Some crackles but much clearer than day before  Abdominal: Bowel sounds are normal. She exhibits no distension. There is no abdominal tenderness.  Neurological: She is alert.  Only oriented to self  Nursing note and vitals reviewed.  BMP Latest Ref Rng & Units 07/12/2019 07/11/2019 07/10/2019  Glucose 70 - 99 mg/dL 79 104(H) 148(H)  BUN 8 - 23 mg/dL 13 15 18   Creatinine 0.44 - 1.00 mg/dL 1.65(H) 2.33(H) 3.11(H)  Sodium 135 - 145 mmol/L 138 134(L) 134(L)  Potassium 3.5 - 5.1 mmol/L 3.9 3.0(L) 3.3(L)  Chloride 98 - 111 mmol/L 108 105 106  CO2 22 - 32 mmol/L 21(L) 18(L) 18(L)  Calcium 8.9 - 10.3 mg/dL 7.6(L) 7.6(L) 7.2(L)   CBC Latest Ref Rng & Units 07/12/2019 07/11/2019 07/10/2019  WBC 4.0 - 10.5 K/uL 18.2(H) 16.9(H) 13.1(H)  Hemoglobin 12.0 - 15.0 g/dL 7.1(L) 7.4(L) 7.2(L)  Hematocrit 36.0 - 46.0 % 21.2(L)  22.0(L) 22.0(L)  Platelets 150 - 400 K/uL 451(H) 443(H) 400   Chest CT: Extensive bilateral irregular ground-glass and consolidative opacity. Small bilateral pleural effusions with associated atelectasis or consolidation. Most consistent with multifocal infection. No discrete nodules but metastatic disease not strictly excluded given hx of ovarian malignancy.   Assessment/Plan:   Ms. Dicicco is a 73 year old female with metastatic ovarian cancer on chemotherapy with paclitaxel/avastin and PE on Xarelto who presented with fever. Infectious workup unrevealing on admission. Due to decline in mentation we obtained an MRI concerning for PRES syndrome. Patient with slightly improved mentation since management of bp for PRES however patient has since developed dyspnea with imaging and labs consistent with a multifocal pnuemonia for which she's being broadly treated and an oliguric renal failure likely ischemic secondary to low bps now improved.  # Fever with AMS: Blood cultures show NGTD.  UA within normal limits and urine culture shows no growth.  CT scan and MRI brain do not reveal source for infection.  * Chest x-ray/CT scan concerning for multifocal pneumonia.  * Patient afebrile for over 48 hours  * WBC still trending up from 16.9-18.2 * Patient close to baseline per daughter but only oriented to self on exam  * Trend fever curve and WBC, RVP today  # Dyspnea/Pneumonia: Patient developed dyspnea while admitted, chest xray changed from admission  and concerning for multifocal pneumonia, initially treated with broad spectrum antibiotics * CT yesterday also demonstrates multifocal pneumonia, metastatic disease unable to be ruled out  * WBC up from 16.9 yesterday to 18.2 today   * ID consulted yesterday, does not think this is a bacterial pneumonia and deescalated antibiotics to cefepime only  * Patient in no respiratory distress today and lungs improved on exam * RVP today, trend WBC and fever curves    # PRES Syndrome: Blood pressure well controlled on current regimen. PRES thought to be 2/2 bevacizumab.  * given kidney injury from ischemic ATN, need to avoid hypotension per nephrology  * Continue amlodipine 10mg  QD  # Oliguric Renal Failure secondary to ischemic ATN: Creatinine bumped up to 3.59 from baseline of 1.07, downtrending now.   * Creatinine 1.65 today, continue to trend  # Anemia: Iron studies consistent with anemia of chronic disease. Hemoglobin stable at 7.1 today.  * Patient SOB but not lightheaded * Monitor with CBC. Type and Screened. Iron infusion received.  Dispo: Anticipated discharge pending clinical improvement.   Al Decant, MD 07/12/2019, 7:42 AM Pager: [phone]

## 2019-07-12 NOTE — Progress Notes (Signed)
Resp panel swab taken and sent. Pt resting in bed, daughter at bedside due to pt confusion. Pt is oriented to herself, is able to say what kind of building she is in when presented with choices (bank, hospital, restaurant) and knows [place]. She reports feeling urge to void - explained Foley catheter to her. Taking pills crushed in applesauce, good swallow, able to take thin liquids.

## 2019-07-13 DIAGNOSIS — N179 Acute kidney failure, unspecified: Secondary | ICD-10-CM

## 2019-07-13 DIAGNOSIS — I6783 Posterior reversible encephalopathy syndrome: Principal | ICD-10-CM

## 2019-07-13 DIAGNOSIS — J189 Pneumonia, unspecified organism: Secondary | ICD-10-CM

## 2019-07-13 LAB — CBC
HCT: 21.7 % — ABNORMAL LOW (ref 36.0–46.0)
Hemoglobin: 7.1 g/dL — ABNORMAL LOW (ref 12.0–15.0)
MCH: 32.6 pg (ref 26.0–34.0)
MCHC: 32.7 g/dL (ref 30.0–36.0)
MCV: 99.5 fL (ref 80.0–100.0)
Platelets: 446 10*3/uL — ABNORMAL HIGH (ref 150–400)
RBC: 2.18 MIL/uL — ABNORMAL LOW (ref 3.87–5.11)
RDW: 22.6 % — ABNORMAL HIGH (ref 11.5–15.5)
WBC: 20.3 10*3/uL — ABNORMAL HIGH (ref 4.0–10.5)
nRBC: 9.6 % — ABNORMAL HIGH (ref 0.0–0.2)

## 2019-07-13 LAB — ANAEROBIC CULTURE

## 2019-07-13 LAB — BASIC METABOLIC PANEL
Anion gap: 10 (ref 5–15)
BUN: 9 mg/dL (ref 8–23)
CO2: 20 mmol/L — ABNORMAL LOW (ref 22–32)
Calcium: 7.7 mg/dL — ABNORMAL LOW (ref 8.9–10.3)
Chloride: 108 mmol/L (ref 98–111)
Creatinine, Ser: 1.33 mg/dL — ABNORMAL HIGH (ref 0.44–1.00)
GFR calc Af Amer: 45 mL/min — ABNORMAL LOW (ref >60)
GFR calc non Af Amer: 39 mL/min — ABNORMAL LOW (ref >60)
Glucose, Bld: 78 mg/dL (ref 70–99)
Potassium: 3.4 mmol/L — ABNORMAL LOW (ref 3.5–5.1)
Sodium: 138 mmol/L (ref 135–145)

## 2019-07-13 LAB — MAGNESIUM: Magnesium: 1.7 mg/dL (ref 1.7–2.4)

## 2019-07-13 LAB — HEPARIN LEVEL (UNFRACTIONATED)
Heparin Unfractionated: 0.27 IU/mL — ABNORMAL LOW (ref 0.30–0.70)
Heparin Unfractionated: 0.47 IU/mL (ref 0.30–0.70)

## 2019-07-13 MED ORDER — FOLIC ACID 1 MG PO TABS
1.0000 mg | ORAL_TABLET | Freq: Every day | ORAL | Status: DC
  Administered 2019-07-13 – 2019-07-17 (×5): 1 mg via ORAL
  Filled 2019-07-13 (×5): qty 1

## 2019-07-13 MED ORDER — ESCITALOPRAM OXALATE 20 MG PO TABS
20.0000 mg | ORAL_TABLET | Freq: Every day | ORAL | Status: DC
  Administered 2019-07-13 – 2019-07-17 (×5): 20 mg via ORAL
  Filled 2019-07-13 (×5): qty 1

## 2019-07-13 MED ORDER — PREDNISONE 20 MG PO TABS
40.0000 mg | ORAL_TABLET | Freq: Every day | ORAL | Status: DC
  Administered 2019-07-14 – 2019-07-17 (×4): 40 mg via ORAL
  Filled 2019-07-13 (×4): qty 2

## 2019-07-13 MED ORDER — LIDOCAINE 4 % EX CREA
TOPICAL_CREAM | Freq: Once | CUTANEOUS | Status: DC
  Filled 2019-07-13: qty 5

## 2019-07-13 NOTE — Progress Notes (Signed)
Pharmacy Antibiotic Note  Rhonda Hale is a 73 y.o. female admitted on 07/03/2019 with altered mental status.  Pharmacy has been consulted for cefepime dosing. Pt has altering renal function with current Cr of 1.33. WBC continue to trend up and are 20.3 today, pt is afebrile.   Plan: Continue cefepime 2 gram every 24 hours   Monitor renal function and clinical improvement   Height: 5\' 2"  (157.5 cm) Weight: 179 lb 10.8 oz (81.5 kg) IBW/kg (Calculated) : 50.1  Temp (24hrs), Avg:98.3 F (36.8 C), Min:97.8 F (36.6 C), Max:98.8 F (37.1 C)  Recent Labs  Lab 07/09/19 1501  07/10/19 0452 07/10/19 1451 07/11/19 0351 07/12/19 0433 07/13/19 0021  WBC 10.7*  --  13.1*  --  16.9* 18.2* 20.3*  CREATININE 3.22*   < > 3.38* 3.11* 2.33* 1.65* 1.33*   < > = values in this interval not displayed.    Estimated Creatinine Clearance: 36.2 mL/min (A) (by C-G formula based on SCr of 1.33 mg/dL (H)).    Allergies  Allergen Reactions  . Atorvastatin Other (See Comments)    Severe heartburn, reflux    Antimicrobials this admission: 7/22 Vancomycin >> 7/23 7/22 Cefepime >> 7/24 7/24 Zosyn >> 7/25 7/25 Acyclovir >>7/28 7/28 Azithromycin >>7/30 7/28 Ceftriaxone >>7/29 7/29 Zyvox >>7/30 7/29 Cefepime >> 7/31 Bactrim >>  Dose adjustments this admission: N/A  Microbiology results: 7/22; 7/24 BCx (x2):  ngtd 7/22 UCx: ngtd 7/23 MRSA PCR: neg 7/25 Bcx: ngtd 7/25 MRI: Most concerning for PRES 7/27 CSF: NGTD 7/28 Procalcitonin 0.28 7/31 resp panel neg  Thank you for allowing pharmacy to be a part of this patient's care.  Eddie Candle, PharmD PGY-1 Pharmacy Resident  Phone: [phone]

## 2019-07-13 NOTE — Progress Notes (Signed)
   Subjective: Doing well overnight. Had an episode of SHOB that she got dilaudid for but otherwise uneventful. She would like to go home. Tolerating PO intake. Discussed that plan to continue antibiotics and coordinate care with ID. She will continue to get up to chair with meals and try to avoid further deconditioning.   Objective: Vital signs in last 24 hours: Vitals:   07/12/19 2121 07/13/19 0046 07/13/19 0152 07/13/19 0515  BP: 112/73  (!) 132/58 (!) 125/57  Pulse: 90 86 84 83  Resp:  (!) 25 (!) 23 20  Temp:  98.5 F (36.9 C)    TempSrc:  Oral    SpO2:  97% 95% 96%  Weight:      Height:       General: Well nourished female in no acute distress Pulm: Good air movement with no wheezing or crackles  CV: RRR, no murmurs, no rubs  Neuro: Alert and oriented x 3  Assessment/Plan:  Ms. Hamblin is a 73 year old female with metastatic ovarian cancer on chemotherapy with paclitaxel/avastin and PE on Xarelto who presented with fever. Infectious workup unrevealing on admission. Due to decline in mentation we obtained an MRI concerning for PRES syndrome. Patient with slightly improved mentation since management of bp for PRES however patient has since developed dyspnea with imaging and labs consistent with a multifocal pnuemonia for which she's being broadly treated and an oliguric renal failure likely ischemic secondary to low bps now improved.  Multifocal Pneumonia Acute Hypoxic Respiratory Failure - Patient dyspnea is improving but she continues to have an oxygen requirement of 3L/min [place]  - She did receive 1 dose of dilaudid overnight for air hunger  - Afebrile but leukocytosis continues to increased  - RVP negative - Currently on cefepime and bactrim (PCP coverage). Appreciate ID recs/help.  - If she continues to desaturate we may need to add prednisone   PRES Syndrome 2/2 Bevacizumab.  - SBP goal 120-140  - Continue Amlodipine 10 mg QD and Labetalol 50 mg BID   Ischemic ATN:  -  Creatine trending down. Baseline 1.07, at 1.3 today  - Urine output stable, 1600cc out over past 24 hours  - Continues to have a NAGMA  - Avoid nephrotoxic medication and monitor closely with use of Bactrim   Anemia of Chronic Disease - Hgb remains stable  - Transfuse if <7.0 of hemodynamically unstable   Dispo: Anticipated discharge in approximately 1-2 day(s) pending improvement in acute hypoxic respiratory failure.   [name], MD 07/13/2019, 7:43 AM

## 2019-07-13 NOTE — Progress Notes (Signed)
[place] for Heparin Indication: pulmonary embolus  Allergies  Allergen Reactions  . Atorvastatin Other (See Comments)    Severe heartburn, reflux    Patient Measurements: Height: 5\' 2"  (157.5 cm) Weight: 179 lb 10.8 oz (81.5 kg) IBW/kg (Calculated) : 50.1 Heparin Dosing Weight: 67.4 kg  Vital Signs: Temp: 98.5 F (36.9 C) (08/01 0046) Temp Source: Oral (08/01 0046) BP: 112/73 (07/31 2121) Pulse Rate: 86 (08/01 0046)  Labs: Recent Labs    07/10/19 0452  07/10/19 1451 07/11/19 0351 07/12/19 0433 07/12/19 1356 07/13/19 0002  HGB 7.2*  --   --  7.4* 7.1*  --   --   HCT 22.0*  --   --  22.0* 21.2*  --   --   PLT 400  --   --  443* 451*  --   --   HEPARINUNFRC  --    < >  --  0.61 0.91* 0.76* 0.47  CREATININE 3.38*  --  3.11* 2.33* 1.65*  --   --    < > = values in this interval not displayed.    Estimated Creatinine Clearance: 29.2 mL/min (A) (by C-G formula based on SCr of 1.65 mg/dL (H)).  Assessment: 73 yr old female with h/o PE, Xarelto on hold, for heparin  Goal of Therapy:  Heparin level 0.3-0.7 units/ml Monitor platelets by anticoagulation protocol: Yes   Plan:  Continue Heparin at current rate  Phillis Knack, PharmD, BCPS

## 2019-07-13 NOTE — Progress Notes (Addendum)
INFECTIOUS DISEASE PROGRESS NOTE  ID: Rhonda Hale is a 73 y.o. female with  Principal Problem:   PRES (posterior reversible encephalopathy syndrome) Active Problems:   Fever   AKI (acute kidney injury) ([place])   Hypercalcemia   Hypomagnesemia, chronic   Multifocal pneumonia  Subjective: Feels better, still not well.  desturated (per daughter) to 89% when going to bathroom.  Continued productive cough, occasional.   Abtx:  Anti-infectives (From admission, onward)   Start     Dose/Rate Route Frequency Ordered Stop   07/12/19 1200  sulfamethoxazole-trimethoprim (BACTRIM) 156.8 mg in dextrose 5 % 250 mL IVPB     10 mg/kg/day  62.7 kg (Adjusted) 259.8 mL/hr over 60 Minutes Intravenous Every 6 hours 07/12/19 0923     07/10/19 1000  linezolid (ZYVOX) IVPB 600 mg  Status:  Discontinued     600 mg 300 mL/hr over 60 Minutes Intravenous Every 12 hours 07/10/19 0926 07/11/19 1538   07/10/19 0930  ceFEPIme (MAXIPIME) 2 g in sodium chloride 0.9 % 100 mL IVPB     2 g 200 mL/hr over 30 Minutes Intravenous Every 24 hours 07/10/19 0926     07/09/19 2200  acyclovir (ZOVIRAX) 615 mg in dextrose 5 % 100 mL IVPB  Status:  Discontinued     615 mg 112.3 mL/hr over 60 Minutes Intravenous Every 24 hours 07/09/19 0913 07/09/19 1027   07/09/19 0630  azithromycin (ZITHROMAX) 500 mg in sodium chloride 0.9 % 250 mL IVPB  Status:  Discontinued     500 mg 250 mL/hr over 60 Minutes Intravenous Every 24 hours 07/09/19 0526 07/11/19 1538   07/09/19 0530  cefTRIAXone (ROCEPHIN) 2 g in sodium chloride 0.9 % 100 mL IVPB  Status:  Discontinued     2 g 200 mL/hr over 30 Minutes Intravenous Every 24 hours 07/09/19 0526 07/10/19 0911   07/06/19 1415  acyclovir (ZOVIRAX) 615 mg in dextrose 5 % 100 mL IVPB  Status:  Discontinued     615 mg 112.3 mL/hr over 60 Minutes Intravenous Every 12 hours 07/06/19 1412 07/09/19 0913   07/05/19 1400  piperacillin-tazobactam (ZOSYN) IVPB 3.375 g  Status:  Discontinued     3.375  g 12.5 mL/hr over 240 Minutes Intravenous Every 8 hours 07/05/19 1214 07/06/19 1526   07/05/19 1130  vancomycin (VANCOCIN) 1,250 mg in sodium chloride 0.9 % 250 mL IVPB  Status:  Discontinued     1,250 mg 166.7 mL/hr over 90 Minutes Intravenous Every 48 hours 07/03/19 1130 07/04/19 1018   07/03/19 2230  ceFEPIme (MAXIPIME) 2 g in sodium chloride 0.9 % 100 mL IVPB  Status:  Discontinued     2 g 200 mL/hr over 30 Minutes Intravenous Every 12 hours 07/03/19 1130 07/05/19 1201   07/03/19 1130  vancomycin (VANCOCIN) 1,750 mg in sodium chloride 0.9 % 500 mL IVPB     1,750 mg 250 mL/hr over 120 Minutes Intravenous  Once 07/03/19 1029 07/03/19 1630   07/03/19 1130  ceFEPIme (MAXIPIME) 2 g in sodium chloride 0.9 % 100 mL IVPB     2 g 200 mL/hr over 30 Minutes Intravenous  Once 07/03/19 1029 07/03/19 1128      Medications:  Scheduled: . amLODipine  10 mg Oral Daily  . calcium-vitamin D  1 tablet Oral BID  . escitalopram  20 mg Oral Daily  . feeding supplement  1 Container Oral TID BM  . feeding supplement (PRO-STAT SUGAR FREE 64)  30 mL Oral BID WC  . folic acid  1 mg Oral Daily  . labetalol  50 mg Oral BID  . levothyroxine  137 mcg Oral QAC breakfast  . lidocaine   Topical Once  . magnesium oxide  800 mg Oral BID  . multivitamin with minerals  1 tablet Oral Daily  . potassium chloride  20 mEq Oral BID  . sodium chloride flush  3 mL Intravenous Q12H    Objective: Vital signs in last 24 hours: Temp:  [97.8 F (36.6 C)-98.8 F (37.1 C)] 97.8 F (36.6 C) (08/01 0935) Pulse Rate:  [79-90] 80 (08/01 0935) Resp:  [20-27] 21 (08/01 0935) BP: (108-135)/(54-73) 135/65 (08/01 0935) SpO2:  [94 %-99 %] 94 % (08/01 0935)   General appearance: alert, cooperative and no distress Resp: diminished breath sounds anterior - right Chest wall: no tenderness, port on R is non-tender, no erythema or fluctuance.  Cardio: regular rate and rhythm GI: normal findings: bowel sounds normal and soft,  non-tender Extremities: edema BLE  Lab Results Recent Labs    07/12/19 0433 07/13/19 0021  WBC 18.2* 20.3*  HGB 7.1* 7.1*  HCT 21.2* 21.7*  NA 138 138  K 3.9 3.4*  CL 108 108  CO2 21* 20*  BUN 13 9  CREATININE 1.65* 1.33*   Liver Panel Recent Labs    07/11/19 0351 07/12/19 0433  PROT 5.5* 5.4*  ALBUMIN 2.1* 2.1*  AST 24 24  ALT 18 18  ALKPHOS 73 74  BILITOT 0.4 0.7   Sedimentation Rate No results for input(s): ESRSEDRATE in the last 72 hours. C-Reactive Protein No results for input(s): CRP in the last 72 hours.  Microbiology: Recent Results (from the past 240 hour(s))  Urine culture     Status: None   Collection Time: 07/03/19  4:11 PM   Specimen: In/Out Cath Urine  Result Value Ref Range Status   Specimen Description IN/OUT CATH URINE  Final   Special Requests NONE  Final   Culture   Final    NO GROWTH Performed at [facility], [address], [place], [place] [address]    Report Status 07/04/2019 FINAL  Final  MRSA PCR Screening     Status: None   Collection Time: 07/04/19  7:48 AM   Specimen: Nasal Mucosa; Nasopharyngeal  Result Value Ref Range Status   MRSA by PCR NEGATIVE NEGATIVE Final    Comment:        The GeneXpert MRSA Assay (FDA approved for NASAL specimens only), is one component of a comprehensive MRSA colonization surveillance program. It is not intended to diagnose MRSA infection nor to guide or monitor treatment for MRSA infections. Performed at [facility], [place] [address], [place], [place] [address]   Culture, blood (routine x 2)     Status: None   Collection Time: 07/05/19  7:12 AM   Specimen: BLOOD  Result Value Ref Range Status   Specimen Description BLOOD RIGHT ANTECUBITAL  Final   Special Requests   Final    BOTTLES DRAWN AEROBIC AND ANAEROBIC Blood Culture adequate volume   Culture   Final    NO GROWTH 5 DAYS Performed at [facility], [place] [address], [place], [place] [address]    Report Status  07/10/2019 FINAL  Final  Culture, blood (routine x 2)     Status: None   Collection Time: 07/05/19  7:17 AM   Specimen: BLOOD RIGHT HAND  Result Value Ref Range Status   Specimen Description BLOOD RIGHT HAND  Final  Special Requests   Final    BOTTLES DRAWN AEROBIC ONLY Blood Culture adequate volume   Culture   Final    NO GROWTH 5 DAYS Performed at [facility], [place] [address] [place], [place] [address]    Report Status 07/10/2019 FINAL  Final  Culture, blood (routine x 2)     Status: None   Collection Time: 07/06/19  3:08 AM   Specimen: BLOOD RIGHT HAND  Result Value Ref Range Status   Specimen Description BLOOD RIGHT HAND  Final   Special Requests   Final    BOTTLES DRAWN AEROBIC AND ANAEROBIC Blood Culture adequate volume   Culture   Final    NO GROWTH 5 DAYS Performed at [facility], [place] [address] [place], [place] [address]    Report Status 07/11/2019 FINAL  Final  Anaerobic culture     Status: None   Collection Time: 07/08/19  3:16 PM   Specimen: CSF; Cerebrospinal Fluid  Result Value Ref Range Status   Specimen Description CSF  Final   Special Requests NONE  Final   Culture   Final    NO ANAEROBES ISOLATED Performed at [facility], [place] [address], [place], [place] [address]    Report Status 07/13/2019 FINAL  Final  CSF culture     Status: None   Collection Time: 07/08/19  3:16 PM   Specimen: CSF; Cerebrospinal Fluid  Result Value Ref Range Status   Specimen Description CSF  Final   Special Requests NONE  Final   Gram Stain   Final    WBC PRESENT, PREDOMINANTLY MONONUCLEAR NO ORGANISMS SEEN CYTOSPIN SMEAR    Culture   Final    NO GROWTH 3 DAYS Performed at [facility], [place] [address], [place], [place] [address]    Report Status 07/11/2019 FINAL  Final  Fungus Culture With Stain     Status: None (Preliminary result)   Collection Time: 07/08/19  3:16 PM   Specimen: CSF; Cerebrospinal Fluid  Result Value Ref Range Status    Fungus Stain Final report  Final    Comment: (NOTE) Performed At: [facility] [place], [place] [id number] Rush Farmer MD [id number]    Fungus (Mycology) Culture PENDING  Incomplete   Fungal Source CSF  Final    Comment: Performed at [facility], [place] [address], [place], [place] [address]  Fungus Culture Result     Status: None   Collection Time: 07/08/19  3:16 PM  Result Value Ref Range Status   Result 1 Comment  Final    Comment: (NOTE) KOH/Calcofluor preparation:  no fungus observed. Performed At: [facility] [place], [place] [id number] Rush Farmer MD [id number]   Culture, Urine     Status: Abnormal   Collection Time: 07/10/19  9:08 AM   Specimen: Urine, Random  Result Value Ref Range Status   Specimen Description URINE, RANDOM  Final   Special Requests NONE  Final   Culture (A)  Final    <10,000 COLONIES/mL INSIGNIFICANT GROWTH Performed at [facility], [address] [place], [place] [address]    Report Status 07/11/2019 FINAL  Final  Culture, blood (routine x 2)     Status: None (Preliminary result)   Collection Time: 07/10/19 10:21 AM   Specimen: BLOOD RIGHT HAND  Result Value Ref Range Status   Specimen Description BLOOD RIGHT HAND  Final   Special Requests  Final    BOTTLES DRAWN AEROBIC AND ANAEROBIC Blood Culture adequate volume   Culture   Final    NO GROWTH 3 DAYS Performed at [facility], [place] [address], [place], [place] [address]    Report Status PENDING  Incomplete  Culture, blood (routine x 2)     Status: None (Preliminary result)   Collection Time: 07/10/19 10:41 AM   Specimen: BLOOD LEFT HAND  Result Value Ref Range Status   Specimen Description BLOOD LEFT HAND  Final   Special Requests   Final    BOTTLES DRAWN AEROBIC AND ANAEROBIC Blood Culture results may not be optimal due to an inadequate volume of blood received in culture bottles   Culture   Final    NO GROWTH  3 DAYS Performed at [facility], [place] [address] [place], [place] [address]    Report Status PENDING  Incomplete  SARS Coronavirus 2 [facility] order, Performed in [place] hospital lab)     Status: None   Collection Time: 07/11/19  4:39 PM   Specimen: Nasopharyngeal Swab  Result Value Ref Range Status   SARS Coronavirus 2 NEGATIVE NEGATIVE Final    Comment: (NOTE) If result is NEGATIVE SARS-CoV-2 target nucleic acids are NOT DETECTED. The SARS-CoV-2 RNA is generally detectable in upper and lower  respiratory specimens during the acute phase of infection. The lowest  concentration of SARS-CoV-2 viral copies this assay can detect is 250  copies / mL. A negative result does not preclude SARS-CoV-2 infection  and should not be used as the sole basis for treatment or other  patient management decisions.  A negative result may occur with  improper specimen collection / handling, submission of specimen other  than nasopharyngeal swab, presence of viral mutation(s) within the  areas targeted by this assay, and inadequate number of viral copies  (<250 copies / mL). A negative result must be combined with clinical  observations, patient history, and epidemiological information. If result is POSITIVE SARS-CoV-2 target nucleic acids are DETECTED. The SARS-CoV-2 RNA is generally detectable in upper and lower  respiratory specimens dur ing the acute phase of infection.  Positive  results are indicative of active infection with SARS-CoV-2.  Clinical  correlation with patient history and other diagnostic information is  necessary to determine patient infection status.  Positive results do  not rule out bacterial infection or co-infection with other viruses. If result is PRESUMPTIVE POSTIVE SARS-CoV-2 nucleic acids MAY BE PRESENT.   A presumptive positive result was obtained on the submitted specimen  and confirmed on repeat testing.  While 2019 novel coronavirus  (SARS-CoV-2) nucleic  acids may be present in the submitted sample  additional confirmatory testing may be necessary for epidemiological  and / or clinical management purposes  to differentiate between  SARS-CoV-2 and other Sarbecovirus currently known to infect humans.  If clinically indicated additional testing with an alternate test  methodology [phone]) is advised. The SARS-CoV-2 RNA is generally  detectable in upper and lower respiratory sp ecimens during the acute  phase of infection. The expected result is Negative. Fact Sheet for Patients:  StrictlyIdeas.no Fact Sheet for Healthcare Providers: BankingDealers.co.za This test is not yet approved or cleared by the [place] FDA and has been authorized for detection and/or diagnosis of SARS-CoV-2 by FDA under an Emergency Use Authorization (EUA).  This EUA will remain in effect (meaning this test can be used) for the duration of the COVID-19 declaration under Section 564(b)(1) of the  Act, 21 U.S.C. section 360bbb-3(b)(1), unless the authorization is terminated or revoked sooner. Performed at [facility], [place] [address], [place], [place] [address]   Respiratory Panel by PCR     Status: None   Collection Time: 07/12/19 10:06 AM   Specimen: Nasopharyngeal Swab; Respiratory  Result Value Ref Range Status   Adenovirus NOT DETECTED NOT DETECTED Final   Coronavirus 229E NOT DETECTED NOT DETECTED Final    Comment: (NOTE) The Coronavirus on the Respiratory Panel, DOES NOT test for the novel  Coronavirus (2019 nCoV)    Coronavirus HKU1 NOT DETECTED NOT DETECTED Final   Coronavirus NL63 NOT DETECTED NOT DETECTED Final   Coronavirus OC43 NOT DETECTED NOT DETECTED Final   Metapneumovirus NOT DETECTED NOT DETECTED Final   Rhinovirus / Enterovirus NOT DETECTED NOT DETECTED Final   Influenza A NOT DETECTED NOT DETECTED Final   Influenza B NOT DETECTED NOT DETECTED Final   Parainfluenza Virus 1 NOT  DETECTED NOT DETECTED Final   Parainfluenza Virus 2 NOT DETECTED NOT DETECTED Final   Parainfluenza Virus 3 NOT DETECTED NOT DETECTED Final   Parainfluenza Virus 4 NOT DETECTED NOT DETECTED Final   Respiratory Syncytial Virus NOT DETECTED NOT DETECTED Final   Bordetella pertussis NOT DETECTED NOT DETECTED Final   Chlamydophila pneumoniae NOT DETECTED NOT DETECTED Final   Mycoplasma pneumoniae NOT DETECTED NOT DETECTED Final    Comment: Performed at [facility], [place], [place], [place] [address]    Studies/Results: No results found.   Assessment/Plan: Ovarian Cancer (last CTX 7-15) PRESS PNA (? PCP) ARF  Total days of antibiotics: 4 cefepime, 2 bactrim/steroids  Afeb. SPO2 stable > 48 on 3L O2.  Cr improving WBC trending up  Would agree with adding steroids as she is still having episodes of desat when walking.  If possible, would ask respiratory to induce sputum for PCP DFA.          Bobby Rumpf MD, FACP Infectious Diseases (pager) [phone] [email or web address] 07/13/2019, 3:27 PM  LOS: 10 days

## 2019-07-13 NOTE — Progress Notes (Addendum)
ANTICOAGULATION CONSULT NOTE - [place] for Heparin Indication: pulmonary embolus  Allergies  Allergen Reactions  . Atorvastatin Other (See Comments)    Severe heartburn, reflux    Patient Measurements: Height: 5\' 2"  (157.5 cm) Weight: 179 lb 10.8 oz (81.5 kg) IBW/kg (Calculated) : 50.1 Heparin Dosing Weight: 68.3  Vital Signs: Temp: 98.5 F (36.9 C) (08/01 0046) Temp Source: Oral (08/01 0046) BP: 125/57 (08/01 0515) Pulse Rate: 83 (08/01 0515)  Labs: Recent Labs    07/11/19 0351 07/12/19 0433 07/12/19 1356 07/13/19 0002 07/13/19 0021 07/13/19 0558  HGB 7.4* 7.1*  --   --  7.1*  --   HCT 22.0* 21.2*  --   --  21.7*  --   PLT 443* 451*  --   --  446*  --   HEPARINUNFRC 0.61 0.91* 0.76* 0.47  --  0.27*  CREATININE 2.33* 1.65*  --   --  1.33*  --     Estimated Creatinine Clearance: 36.2 mL/min (A) (by C-G formula based on SCr of 1.33 mg/dL (H)).  Assessment: 73 yr old female with hx of PE, on Xarelto 20 mg/day at home (last dose of Xarelto while inpt was on 07/06/19, in prep for LP and held for unstable renal function). Pharmacy is consulted to dose heparin   Heparin level on 8/1 is sub-therapeutic at 0.27 on 850 units/hour. Dose was recently decreased from 950 units/hr d/t supra-therapeutic levels. CBC is still stable with Hgb 7.1, Hct 21.7, and plts of 446. No s/sx bleeding or infusion issues per nursing.   Of note, pt has AKI, with Scr trending down (from 1.65 yesterday to 1.33 today) Other medical hx includes: ovarian cancer (S/P hysterectomy, splenectomy, on chemo), fever with AMS, PRESS, hypercalcemia, anemia.   Goal of Therapy:  Heparin level 0.3-0.7 units/ml Monitor platelets by anticoagulation protocol: Yes   Plan:  -Increase heparin to 900 units/hr -Check heparin level with AM labs -Monitor for s/sx of bleeding  -Monitor renal function to transition back to Xarelto as appropriate.     Thank you,   Eddie Candle,  PharmD PGY-1 Pharmacy Resident  Phone: [phone]

## 2019-07-14 DIAGNOSIS — D638 Anemia in other chronic diseases classified elsewhere: Secondary | ICD-10-CM

## 2019-07-14 LAB — CBC WITH DIFFERENTIAL/PLATELET
Abs Immature Granulocytes: 3.33 10*3/uL — ABNORMAL HIGH (ref 0.00–0.07)
Basophils Absolute: 0.2 10*3/uL — ABNORMAL HIGH (ref 0.0–0.1)
Basophils Relative: 1 %
Eosinophils Absolute: 0.3 10*3/uL (ref 0.0–0.5)
Eosinophils Relative: 1 %
HCT: 21.9 % — ABNORMAL LOW (ref 36.0–46.0)
Hemoglobin: 7.2 g/dL — ABNORMAL LOW (ref 12.0–15.0)
Immature Granulocytes: 17 %
Lymphocytes Relative: 17 %
Lymphs Abs: 3.2 10*3/uL (ref 0.7–4.0)
MCH: 32.9 pg (ref 26.0–34.0)
MCHC: 32.9 g/dL (ref 30.0–36.0)
MCV: 100 fL (ref 80.0–100.0)
Monocytes Absolute: 3.1 10*3/uL — ABNORMAL HIGH (ref 0.1–1.0)
Monocytes Relative: 16 %
Neutro Abs: 9.5 10*3/uL — ABNORMAL HIGH (ref 1.7–7.7)
Neutrophils Relative %: 48 %
Platelets: 479 10*3/uL — ABNORMAL HIGH (ref 150–400)
RBC: 2.19 MIL/uL — ABNORMAL LOW (ref 3.87–5.11)
RDW: 22.6 % — ABNORMAL HIGH (ref 11.5–15.5)
WBC: 19.6 10*3/uL — ABNORMAL HIGH (ref 4.0–10.5)
nRBC: 10.6 % — ABNORMAL HIGH (ref 0.0–0.2)

## 2019-07-14 LAB — RENAL FUNCTION PANEL
Albumin: 2 g/dL — ABNORMAL LOW (ref 3.5–5.0)
Anion gap: 11 (ref 5–15)
BUN: 6 mg/dL — ABNORMAL LOW (ref 8–23)
CO2: 21 mmol/L — ABNORMAL LOW (ref 22–32)
Calcium: 7.5 mg/dL — ABNORMAL LOW (ref 8.9–10.3)
Chloride: 106 mmol/L (ref 98–111)
Creatinine, Ser: 1.17 mg/dL — ABNORMAL HIGH (ref 0.44–1.00)
GFR calc Af Amer: 53 mL/min — ABNORMAL LOW (ref >60)
GFR calc non Af Amer: 46 mL/min — ABNORMAL LOW (ref >60)
Glucose, Bld: 84 mg/dL (ref 70–99)
Phosphorus: 3.4 mg/dL (ref 2.5–4.6)
Potassium: 3.4 mmol/L — ABNORMAL LOW (ref 3.5–5.1)
Sodium: 138 mmol/L (ref 135–145)

## 2019-07-14 LAB — HEPARIN LEVEL (UNFRACTIONATED): Heparin Unfractionated: 0.45 IU/mL (ref 0.30–0.70)

## 2019-07-14 MED ORDER — SODIUM CHLORIDE 0.9 % IV SOLN
2.0000 g | Freq: Two times a day (BID) | INTRAVENOUS | Status: DC
  Administered 2019-07-14 (×2): 2 g via INTRAVENOUS
  Filled 2019-07-14 (×2): qty 2

## 2019-07-14 MED ORDER — GERHARDT'S BUTT CREAM
TOPICAL_CREAM | Freq: Three times a day (TID) | CUTANEOUS | Status: DC
  Administered 2019-07-14: 23:00:00 via TOPICAL
  Administered 2019-07-15: 1 via TOPICAL
  Administered 2019-07-15 – 2019-07-17 (×6): via TOPICAL
  Filled 2019-07-14 (×3): qty 1

## 2019-07-14 NOTE — Progress Notes (Signed)
Internal Medicine Attending:   I saw and examined the patient. I reviewed the resident's note and I agree with the resident's findings and plan as documented in the resident's note.  Patient feels much better today and is able to ambulate with assistance.  She states that her shortness of breath is improving.  Patient was initially made to the hospital with acute hypoxic respiratory failure secondary to multifocal pneumonia.  Patient's O2 sats are stable on 3 L nasal cannula.  Will complete course of cefepime for multifocal pneumonia.  Given that the patient was immunosuppressed (on chemotherapy) it is possible that her pneumonia is secondary to PCP.  Patient was empirically started on Bactrim and prednisone was added onto her regimen yesterday.  We will continue with current regimen for now.  ID to follow-up next week.  No further work-up at this time.  Patient also had PRES secondary to bevacizumab.  Patient blood pressure is at goal (SBP 120-140).  Continue with amlodipine and labetalol.

## 2019-07-14 NOTE — Plan of Care (Signed)

## 2019-07-14 NOTE — Progress Notes (Addendum)
   Subjective: Patient is doing well today. She was able to ambulate with assistance last night and sit in the chair. Breathing is doing well. Daughter feels she is back to baseline. We discussed the plan to continue antibiotics and follow her cultures. Discussed likely discharge tomorrow. All questions and concerns addressed.   Objective: Vital signs in last 24 hours: Vitals:   07/13/19 2001 07/14/19 0137 07/14/19 0616 07/14/19 0900  BP: (!) 126/56     Pulse: 85     Resp: (!) 25     Temp: 98.2 F (36.8 C) 98.4 F (36.9 C) 97.7 F (36.5 C) 97.8 F (36.6 C)  TempSrc: Oral Axillary Oral Axillary  SpO2: 95%     Weight:      Height:       General: Well nourished female in no acute distress Pulm: Good air movement with faint bibasilar crackles CV: RRR, no murmurs, no rubs  Extremities: No LE edema   Assessment/Plan:  Rhonda Hale is a 73 year old female with metastatic ovarian cancer on chemotherapy with paclitaxel/avastin and PE on Xarelto who presented with fever. Infectious workup unrevealing on admission. Due to decline in mentation we obtained an MRI concerning for PRES syndrome. Patient with slightly improved mentation since management of bp for PRES however patient has since developed dyspnea with imaging and labs consistent with a multifocal pnuemonia for which she's being broadly treated and an oliguric renal failure likely ischemic secondary to low bps now improved.  Multifocal Pneumonia Acute Hypoxic Respiratory Failure - Patient dyspnea is improving. On 3L/min [name] - Remains afebrile  - Started on Prednisone 40 mg QD - Currently on cefepime (Day #6) and bactrim (PCP coverage). Appreciate ID recs/help.  - Follow-up PCP sputum - In regards to her Abx we can stop her cefepime tomorrow morning. Bactrim will need to be continued on discharge    PRES Syndrome 2/2 Bevacizumab.  - SBP goal 120-140  - Continue Amlodipine 10 mg QD and Labetalol 50 mg BID   Ischemic ATN:  -  Creatine trending down. Baseline 1.07, at 1.17 today  - Urine output stable - Continues to have a NAGMA  - Avoid nephrotoxic medication and monitor closely with use of Bactrim   Anemia of Chronic Disease - Hgb remains stable  - Transfuse if <7.0 of hemodynamically unstable   Prior VTE/PE - Previously on Xarelto, transitioned to heparin on admission  - With improvement in renal function should be okay to resume Xarelto on discharge  - Unable to tolerate Lovenox  Dispo: Anticipated discharge in approximately 1 day(s) pending improvement in acute hypoxic respiratory failure.   Rhonda Homes, MD 07/14/2019, 10:40 AM

## 2019-07-14 NOTE — Progress Notes (Signed)
ANTICOAGULATION CONSULT NOTE - [place] for Heparin Indication: pulmonary embolus  Allergies  Allergen Reactions  . Atorvastatin Other (See Comments)    Severe heartburn, reflux    Patient Measurements: Height: 5\' 2"  (157.5 cm) Weight: 179 lb 10.8 oz (81.5 kg) IBW/kg (Calculated) : 50.1 Heparin Dosing Weight: 68.3  Vital Signs: Temp: 97.7 F (36.5 C) (08/02 0616) Temp Source: Oral (08/02 0616) BP: 126/56 (08/01 2001) Pulse Rate: 85 (08/01 2001)  Labs: Recent Labs    07/12/19 0433  07/13/19 0002 07/13/19 0021 07/13/19 0558 07/14/19 0435  HGB 7.1*  --   --  7.1*  --  7.2*  HCT 21.2*  --   --  21.7*  --  21.9*  PLT 451*  --   --  446*  --  479*  HEPARINUNFRC 0.91*   < > 0.47  --  0.27* 0.45  CREATININE 1.65*  --   --  1.33*  --  1.17*   < > = values in this interval not displayed.    Estimated Creatinine Clearance: 41.1 mL/min (A) (by C-G formula based on SCr of 1.17 mg/dL (H)).  Assessment: 73 yr old female with hx of PE, on Xarelto 20 mg/day at home (last dose of Xarelto while inpt was on 07/06/19, in prep for LP and held for unstable renal function). Pharmacy is consulted to dose heparin   Heparin level on 8/2 is therapeutic at 0.45 on 900 units/hour. CBC is still stable with Hgb 7.2, Hct 21.9, and plts of 479.   Of note, pt has AKI, with Scr trending down (from 1.33 yesterday to 1.17 today) Other medical hx includes: ovarian cancer (S/P hysterectomy, splenectomy, on chemo), fever with AMS, PRESS, hypercalcemia, anemia.   Goal of Therapy:  Heparin level 0.3-0.7 units/ml Monitor platelets by anticoagulation protocol: Yes   Plan:  -Continue heparin to 900 units/hr -Check heparin level with AM labs -Monitor for s/sx of bleeding  -Monitor renal function to transition back to Xarelto as appropriate.     Thank you,   Eddie Candle, PharmD PGY-1 Pharmacy Resident  Phone: [phone]

## 2019-07-15 DIAGNOSIS — R159 Full incontinence of feces: Secondary | ICD-10-CM

## 2019-07-15 DIAGNOSIS — K529 Noninfective gastroenteritis and colitis, unspecified: Secondary | ICD-10-CM

## 2019-07-15 LAB — BASIC METABOLIC PANEL
Anion gap: 9 (ref 5–15)
BUN: 10 mg/dL (ref 8–23)
CO2: 21 mmol/L — ABNORMAL LOW (ref 22–32)
Calcium: 7.2 mg/dL — ABNORMAL LOW (ref 8.9–10.3)
Chloride: 109 mmol/L (ref 98–111)
Creatinine, Ser: 1.2 mg/dL — ABNORMAL HIGH (ref 0.44–1.00)
GFR calc Af Amer: 51 mL/min — ABNORMAL LOW (ref >60)
GFR calc non Af Amer: 44 mL/min — ABNORMAL LOW (ref >60)
Glucose, Bld: 115 mg/dL — ABNORMAL HIGH (ref 70–99)
Potassium: 4.6 mmol/L (ref 3.5–5.1)
Sodium: 139 mmol/L (ref 135–145)

## 2019-07-15 LAB — CBC
HCT: 20.6 % — ABNORMAL LOW (ref 36.0–46.0)
Hemoglobin: 6.8 g/dL — CL (ref 12.0–15.0)
MCH: 33.3 pg (ref 26.0–34.0)
MCHC: 33 g/dL (ref 30.0–36.0)
MCV: 101 fL — ABNORMAL HIGH (ref 80.0–100.0)
Platelets: 437 10*3/uL — ABNORMAL HIGH (ref 150–400)
RBC: 2.04 MIL/uL — ABNORMAL LOW (ref 3.87–5.11)
RDW: 23.3 % — ABNORMAL HIGH (ref 11.5–15.5)
WBC: 20.2 10*3/uL — ABNORMAL HIGH (ref 4.0–10.5)
nRBC: 7 % — ABNORMAL HIGH (ref 0.0–0.2)

## 2019-07-15 LAB — CULTURE, BLOOD (ROUTINE X 2)
Culture: NO GROWTH
Culture: NO GROWTH
Special Requests: ADEQUATE

## 2019-07-15 LAB — HEMOGLOBIN AND HEMATOCRIT, BLOOD
HCT: 25 % — ABNORMAL LOW (ref 36.0–46.0)
Hemoglobin: 8.5 g/dL — ABNORMAL LOW (ref 12.0–15.0)

## 2019-07-15 LAB — C DIFFICILE QUICK SCREEN W PCR REFLEX
C Diff antigen: NEGATIVE
C Diff interpretation: NOT DETECTED
C Diff toxin: NEGATIVE

## 2019-07-15 LAB — HEPARIN LEVEL (UNFRACTIONATED): Heparin Unfractionated: 0.58 IU/mL (ref 0.30–0.70)

## 2019-07-15 LAB — PREPARE RBC (CROSSMATCH)

## 2019-07-15 MED ORDER — SULFAMETHOXAZOLE-TRIMETHOPRIM 400-80 MG/5ML IV SOLN
15.0000 mg/kg/d | Freq: Four times a day (QID) | INTRAVENOUS | Status: DC
  Administered 2019-07-15 – 2019-07-16 (×4): 235.2 mg via INTRAVENOUS
  Filled 2019-07-15 (×6): qty 14.7

## 2019-07-15 MED ORDER — SODIUM CHLORIDE 0.9 % IV SOLN
INTRAVENOUS | Status: DC | PRN
  Administered 2019-07-16: 250 mL via INTRAVENOUS

## 2019-07-15 MED ORDER — SODIUM CHLORIDE 0.9% IV SOLUTION: Freq: Once | INTRAVENOUS | Status: DC

## 2019-07-15 MED ORDER — RIVAROXABAN 20 MG PO TABS
20.0000 mg | ORAL_TABLET | Freq: Every day | ORAL | Status: DC
  Administered 2019-07-15 – 2019-07-16 (×2): 20 mg via ORAL
  Filled 2019-07-15 (×2): qty 1

## 2019-07-15 NOTE — Progress Notes (Addendum)
   Subjective:  Patient was awake and aware at bedside. Patient states that she feels a lot better this morning that she did yesterday. She states that she is able to breath better. Her daughter feels like she is at baseline. Concerns about the sudden drop in hemoglobin were answered.   Objective:  Vital signs in last 24 hours: Vitals:   07/14/19 1920 07/14/19 2305 07/14/19 2320 07/15/19 0602  BP: (!) 118/56 (!) 116/54 (!) 113/53   Pulse: 87 88 89   Resp: (!) 22 (!) 21 (!) 23   Temp: 98.4 F (36.9 C) 98 F (36.7 C)    TempSrc: Oral Oral    SpO2: 96% 96% 96%   Weight:    89.2 kg  Height:       Physical Exam  Constitutional: She is well-developed, well-nourished, and in no distress. No distress.  Cardiovascular: Normal rate, regular rhythm, normal heart sounds and intact distal pulses. Exam reveals no gallop and no friction rub.  No murmur heard. Pulmonary/Chest: Effort normal and breath sounds normal. No respiratory distress. She has no wheezes. She has no rales.  Skin: She is not diaphoretic.    Assessment/Plan:  Principal Problem:   PRES (posterior reversible encephalopathy syndrome) Active Problems:   Fever   AKI (acute kidney injury) ([place])   Hypercalcemia   Hypomagnesemia, chronic   Multifocal pneumonia  Multifocal Pneumonia/Acute Hypoxic Respiratory Failure:  - Patient's breathing improved  - 48 hours afebrile  - Continue prednisone 40 mg  - Continue Cefepime and bactrim  - PCP smear pending   PRES Syndrome 2/2 Bevacizumab - SBP Goal 12-140 - Contine Amlodipine and Labetalol   Ischemic ATN:  - Creatine stable - Urine output stable  - Avoid nephrotoxic medication  Anemia of Chronic Disease:  - Hgb 6.8 - Ordered type and cross, will transfuse - Continue to monitor  Prior VTE/PE - Transitioned from heparin to xarelto  - When renal function improves can restart Xarelto on discharge     Dispo: Anticipated discharge in approximately 2 day(s).    Maudie Mercury, MD 07/15/2019, 10:44 AM Pager: [phone]

## 2019-07-15 NOTE — Progress Notes (Signed)
PHARMACY NOTE:  ANTIMICROBIAL RENAL DOSAGE ADJUSTMENT  Current antimicrobial regimen includes a mismatch between antimicrobial dosage and estimated renal function.  As per policy approved by the Pharmacy & Therapeutics and Medical Executive Committees, the antimicrobial dosage will be adjusted accordingly.  Current antimicrobial dosage:  Bactrim 10 mg/kg/day  Indication: PJP Treatment  Renal Function:  Estimated Creatinine Clearance: 42 mL/min (A) (by C-G formula based on SCr of 1.2 mg/dL (H)). []      On intermittent HD, scheduled: []      On CRRT    Antimicrobial dosage has been changed to:  Bactrim 15 mg/kg/day  Additional comments: Renal function improved   Thank you for allowing pharmacy to be a part of this patient's care.  Jimmy Footman, PharmD, BCPS, BCIDP Infectious Diseases Clinical Pharmacist Phone: [phone] 07/15/2019 10:13 AM

## 2019-07-15 NOTE — Progress Notes (Addendum)
See addendum below:  Transitioning back to oral Xarelto.   ANTICOAGULATION CONSULT NOTE - [place] for Heparin Indication: pulmonary embolus  (diagnosed 03/15/19)  Allergies  Allergen Reactions  . Atorvastatin Other (See Comments)    Severe heartburn, reflux    Patient Measurements: Height: 5\' 2"  (157.5 cm) Weight: 196 lb 10.4 oz (89.2 kg) IBW/kg (Calculated) : 50.1 Heparin Dosing Weight: 70.6 kg  Vital Signs: Temp: 98 F (36.7 C) (08/02 2305) Temp Source: Oral (08/02 2305) BP: 113/53 (08/02 2320) Pulse Rate: 89 (08/02 2320)  Labs: Recent Labs    07/13/19 0021 07/13/19 0558 07/14/19 0435 07/15/19 0430  HGB 7.1*  --  7.2* 6.8*  HCT 21.7*  --  21.9* 20.6*  PLT 446*  --  479* 437*  HEPARINUNFRC  --  0.27* 0.45 0.58  CREATININE 1.33*  --  1.17* 1.20*    Estimated Creatinine Clearance: 42 mL/min (A) (by C-G formula based on SCr of 1.2 mg/dL (H)).  Assessment: 73 yr old female with hx of PE (diagnosed 03/15/19), on Xarelto 20 mg/day at home (last dose of Xarelto while inpt was on 07/06/19, in prep for LP and held for unstable renal function). Pharmacy consulted to dose heparin   Heparin level remains therapeutic at 0.58 today on 900 units/hour.  Hgb dropped to 6.8, Hct 20.5,  pltc remains in 400s. No bleeding noted currently. MD has ordered to transfused 1 unit PRBCs .    Goal of Therapy:  Heparin level 0.3-0.7 units/ml Monitor platelets by anticoagulation protocol: Yes   Plan:  -Continue heparin to 900 units/hr -Check heparin level and CBC with AM labs daily -Monitor for s/sx of bleeding  -Monitor renal function to transition back to Xarelto as appropriate.    Thank you,  Nicole Cella, RPh Clinical Pharmacist [phone] Please check AMION for all [place] phone numbers After 10:00 PM, call [place] [phone] 07/15/2019 10:19 AM   ADDENDUM: Pharmacy consulted this afternoon to change from IV heparin back to  oral Xarelto.  Patient  was on Xarelto 20mg  daily PTA for h/o PE. (diagnosed 03/15/19). SCr is 1.2,  Estimated CrCl is 42 ml/min.  No bleeding reported.  PLAN:  Discontinue IV heparin and restart  Xarelto 20 mg po daily with supper.  Monitor for signs and symptoms of bleeding.   Monitor renal function.  Avoid Xarelto if CrCl < 30 ml/min.  Nicole Cella, [place] Clinical Pharmacist [phone] 07/15/2019 2:25 PM

## 2019-07-15 NOTE — Progress Notes (Signed)
CRITICAL VALUE ALERT  Critical Value:  Hemoglobin of 6.8  Date & Time Notied: 07/15/19 0630  Provider Notified: Darrick Meigs Internal medicine resident  Orders Received/Actions taken:  Request for Type and screen order

## 2019-07-15 NOTE — Progress Notes (Signed)
Subjective: Patient feeling better was able to walk to the bathroom   Antibiotics:  Anti-infectives (From admission, onward)   Start     Dose/Rate Route Frequency Ordered Stop   07/15/19 1200  sulfamethoxazole-trimethoprim (BACTRIM) 235.2 mg in dextrose 5 % 250 mL IVPB     15 mg/kg/day  62.7 kg (Adjusted) 264.7 mL/hr over 60 Minutes Intravenous Every 6 hours 07/15/19 0909     07/14/19 1000  ceFEPIme (MAXIPIME) 2 g in sodium chloride 0.9 % 100 mL IVPB  Status:  Discontinued     2 g 200 mL/hr over 30 Minutes Intravenous Every 12 hours 07/14/19 0740 07/15/19 0914   07/12/19 1200  sulfamethoxazole-trimethoprim (BACTRIM) 156.8 mg in dextrose 5 % 250 mL IVPB  Status:  Discontinued     10 mg/kg/day  62.7 kg (Adjusted) 259.8 mL/hr over 60 Minutes Intravenous Every 6 hours 07/12/19 0923 07/15/19 0909   07/10/19 1000  linezolid (ZYVOX) IVPB 600 mg  Status:  Discontinued     600 mg 300 mL/hr over 60 Minutes Intravenous Every 12 hours 07/10/19 0926 07/11/19 1538   07/10/19 0930  ceFEPIme (MAXIPIME) 2 g in sodium chloride 0.9 % 100 mL IVPB  Status:  Discontinued     2 g 200 mL/hr over 30 Minutes Intravenous Every 24 hours 07/10/19 0926 07/14/19 0740   07/09/19 2200  acyclovir (ZOVIRAX) 615 mg in dextrose 5 % 100 mL IVPB  Status:  Discontinued     615 mg 112.3 mL/hr over 60 Minutes Intravenous Every 24 hours 07/09/19 0913 07/09/19 1027   07/09/19 0630  azithromycin (ZITHROMAX) 500 mg in sodium chloride 0.9 % 250 mL IVPB  Status:  Discontinued     500 mg 250 mL/hr over 60 Minutes Intravenous Every 24 hours 07/09/19 0526 07/11/19 1538   07/09/19 0530  cefTRIAXone (ROCEPHIN) 2 g in sodium chloride 0.9 % 100 mL IVPB  Status:  Discontinued     2 g 200 mL/hr over 30 Minutes Intravenous Every 24 hours 07/09/19 0526 07/10/19 0911   07/06/19 1415  acyclovir (ZOVIRAX) 615 mg in dextrose 5 % 100 mL IVPB  Status:  Discontinued     615 mg 112.3 mL/hr over 60 Minutes Intravenous Every 12 hours  07/06/19 1412 07/09/19 0913   07/05/19 1400  piperacillin-tazobactam (ZOSYN) IVPB 3.375 g  Status:  Discontinued     3.375 g 12.5 mL/hr over 240 Minutes Intravenous Every 8 hours 07/05/19 1214 07/06/19 1526   07/05/19 1130  vancomycin (VANCOCIN) 1,250 mg in sodium chloride 0.9 % 250 mL IVPB  Status:  Discontinued     1,250 mg 166.7 mL/hr over 90 Minutes Intravenous Every 48 hours 07/03/19 1130 07/04/19 1018   07/03/19 2230  ceFEPIme (MAXIPIME) 2 g in sodium chloride 0.9 % 100 mL IVPB  Status:  Discontinued     2 g 200 mL/hr over 30 Minutes Intravenous Every 12 hours 07/03/19 1130 07/05/19 1201   07/03/19 1130  vancomycin (VANCOCIN) 1,750 mg in sodium chloride 0.9 % 500 mL IVPB     1,750 mg 250 mL/hr over 120 Minutes Intravenous  Once 07/03/19 1029 07/03/19 1630   07/03/19 1130  ceFEPIme (MAXIPIME) 2 g in sodium chloride 0.9 % 100 mL IVPB     2 g 200 mL/hr over 30 Minutes Intravenous  Once 07/03/19 1029 07/03/19 1128      Medications: Scheduled Meds: . sodium chloride   Intravenous Once  . amLODipine  10 mg Oral Daily  . calcium-vitamin  D  1 tablet Oral BID  . escitalopram  20 mg Oral Daily  . feeding supplement  1 Container Oral TID BM  . feeding supplement (PRO-STAT SUGAR FREE 64)  30 mL Oral BID WC  . folic acid  1 mg Oral Daily  . Gerhardt's butt cream   Topical TID  . labetalol  50 mg Oral BID  . levothyroxine  137 mcg Oral QAC breakfast  . lidocaine   Topical Once  . magnesium oxide  800 mg Oral BID  . multivitamin with minerals  1 tablet Oral Daily  . potassium chloride  20 mEq Oral BID  . predniSONE  40 mg Oral Q breakfast  . sodium chloride flush  3 mL Intravenous Q12H   Continuous Infusions: . sulfamethoxazole-trimethoprim     PRN Meds:.acetaminophen **OR** acetaminophen, hydrALAZINE, ipratropium-albuterol, labetalol, lidocaine-prilocaine, ondansetron **OR** ondansetron (ZOFRAN) IV, senna-docusate, sodium chloride flush    Objective: Weight change:    Intake/Output Summary (Last 24 hours) at 07/15/2019 1159 Last data filed at 07/15/2019 0601 Gross per 24 hour  Intake 1689.57 ml  Output 825 ml  Net 864.57 ml   Blood pressure (!) 113/53, pulse 89, temperature 98 F (36.7 C), temperature source Oral, resp. rate (!) 23, height 5\' 2"  (1.575 m), weight 89.2 kg, SpO2 96 %. Temp:  [97.5 F (36.4 C)-98.4 F (36.9 C)] 98 F (36.7 C) (08/02 2305) Pulse Rate:  [82-89] 89 (08/02 2320) Resp:  [21-23] 23 (08/02 2320) BP: (103-118)/(50-56) 113/53 (08/02 2320) SpO2:  [91 %-96 %] 96 % (08/02 2320) Weight:  [89.2 kg] 89.2 kg (08/03 0602)  Physical Exam: General: Alert and awake, oriented x3, not in any acute distress. HEENT: anicteric sclera, EOMI CVS regular rate, normal  Chest: , no wheezing, no respiratory distress Abdomen: soft non-distended,  Extremities: no edema or deformity noted bilaterally Skin: no rashes Neuro: nonfocal  CBC:    BMET Recent Labs    07/14/19 0435 07/15/19 0430  NA 138 139  K 3.4* 4.6  CL 106 109  CO2 21* 21*  GLUCOSE 84 115*  BUN 6* 10  CREATININE 1.17* 1.20*  CALCIUM 7.5* 7.2*     Liver Panel  Recent Labs    07/14/19 0435  ALBUMIN 2.0*       Sedimentation Rate No results for input(s): ESRSEDRATE in the last 72 hours. C-Reactive Protein No results for input(s): CRP in the last 72 hours.  Micro Results: Recent Results (from the past 720 hour(s))  Blood Culture (routine x 2)     Status: None   Collection Time: 07/03/19 10:00 AM   Specimen: BLOOD RIGHT HAND  Result Value Ref Range Status   Specimen Description BLOOD RIGHT HAND  Final   Special Requests   Final    BOTTLES DRAWN AEROBIC AND ANAEROBIC Blood Culture adequate volume   Culture   Final    NO GROWTH 5 DAYS Performed at [facility], [address], [place], [place] [address]    Report Status 07/08/2019 FINAL  Final  Blood Culture (routine x 2)     Status: None   Collection Time: 07/03/19 10:07 AM   Specimen: BLOOD  RIGHT WRIST  Result Value Ref Range Status   Specimen Description BLOOD RIGHT WRIST  Final   Special Requests   Final    BOTTLES DRAWN AEROBIC AND ANAEROBIC Blood Culture results may not be optimal due to an inadequate volume of blood received in culture bottles   Culture   Final  NO GROWTH 5 DAYS Performed at [facility], [place] [address] [place], [place] [address]    Report Status 07/08/2019 FINAL  Final  SARS Coronavirus 2 (CEPHEID- Performed in [place] hospital lab), Hosp Order     Status: None   Collection Time: 07/03/19 10:13 AM   Specimen: Nasopharyngeal Swab  Result Value Ref Range Status   SARS Coronavirus 2 NEGATIVE NEGATIVE Final    Comment: (NOTE) If result is NEGATIVE SARS-CoV-2 target nucleic acids are NOT DETECTED. The SARS-CoV-2 RNA is generally detectable in upper and lower  respiratory specimens during the acute phase of infection. The lowest  concentration of SARS-CoV-2 viral copies this assay can detect is 250  copies / mL. A negative result does not preclude SARS-CoV-2 infection  and should not be used as the sole basis for treatment or other  patient management decisions.  A negative result may occur with  improper specimen collection / handling, submission of specimen other  than nasopharyngeal swab, presence of viral mutation(s) within the  areas targeted by this assay, and inadequate number of viral copies  (<250 copies / mL). A negative result must be combined with clinical  observations, patient history, and epidemiological information. If result is POSITIVE SARS-CoV-2 target nucleic acids are DETECTED. The SARS-CoV-2 RNA is generally detectable in upper and lower  respiratory specimens dur ing the acute phase of infection.  Positive  results are indicative of active infection with SARS-CoV-2.  Clinical  correlation with patient history and other diagnostic information is  necessary to determine patient infection status.  Positive results do   not rule out bacterial infection or co-infection with other viruses. If result is PRESUMPTIVE POSTIVE SARS-CoV-2 nucleic acids MAY BE PRESENT.   A presumptive positive result was obtained on the submitted specimen  and confirmed on repeat testing.  While 2019 novel coronavirus  (SARS-CoV-2) nucleic acids may be present in the submitted sample  additional confirmatory testing may be necessary for epidemiological  and / or clinical management purposes  to differentiate between  SARS-CoV-2 and other Sarbecovirus currently known to infect humans.  If clinically indicated additional testing with an alternate test  methodology [phone]) is advised. The SARS-CoV-2 RNA is generally  detectable in upper and lower respiratory sp ecimens during the acute  phase of infection. The expected result is Negative. Fact Sheet for Patients:  StrictlyIdeas.no Fact Sheet for Healthcare Providers: BankingDealers.co.za This test is not yet approved or cleared by the [place] FDA and has been authorized for detection and/or diagnosis of SARS-CoV-2 by FDA under an Emergency Use Authorization (EUA).  This EUA will remain in effect (meaning this test can be used) for the duration of the COVID-19 declaration under Section 564(b)(1) of the Act, 21 U.S.C. section 360bbb-3(b)(1), unless the authorization is terminated or revoked sooner. Performed at [facility], [place] [address], [place], [place] [address]   Urine culture     Status: None   Collection Time: 07/03/19  4:11 PM   Specimen: In/Out Cath Urine  Result Value Ref Range Status   Specimen Description IN/OUT CATH URINE  Final   Special Requests NONE  Final   Culture   Final    NO GROWTH Performed at [facility], [place] [address] [place], [place] [address]    Report Status 07/04/2019 FINAL  Final  MRSA PCR Screening     Status: None   Collection Time: 07/04/19  7:48 AM   Specimen: Nasal  Mucosa; Nasopharyngeal  Result Value Ref Range Status   MRSA by PCR NEGATIVE NEGATIVE Final    Comment:        The GeneXpert MRSA Assay (FDA approved for NASAL specimens only), is one component of a comprehensive MRSA colonization surveillance program. It is not intended to diagnose MRSA infection nor to guide or monitor treatment for MRSA infections. Performed at [facility], [place] [address], [place], [place] [address]   Culture, blood (routine x 2)     Status: None   Collection Time: 07/05/19  7:12 AM   Specimen: BLOOD  Result Value Ref Range Status   Specimen Description BLOOD RIGHT ANTECUBITAL  Final   Special Requests   Final    BOTTLES DRAWN AEROBIC AND ANAEROBIC Blood Culture adequate volume   Culture   Final    NO GROWTH 5 DAYS Performed at [facility], [place] [address] [place], [place] [address]    Report Status 07/10/2019 FINAL  Final  Culture, blood (routine x 2)     Status: None   Collection Time: 07/05/19  7:17 AM   Specimen: BLOOD RIGHT HAND  Result Value Ref Range Status   Specimen Description BLOOD RIGHT HAND  Final   Special Requests   Final    BOTTLES DRAWN AEROBIC ONLY Blood Culture adequate volume   Culture   Final    NO GROWTH 5 DAYS Performed at [facility], [place] [address], [place], [place] [address]    Report Status 07/10/2019 FINAL  Final  Culture, blood (routine x 2)     Status: None   Collection Time: 07/06/19  3:08 AM   Specimen: BLOOD RIGHT HAND  Result Value Ref Range Status   Specimen Description BLOOD RIGHT HAND  Final   Special Requests   Final    BOTTLES DRAWN AEROBIC AND ANAEROBIC Blood Culture adequate volume   Culture   Final    NO GROWTH 5 DAYS Performed at [facility], [place] [address] [place], [place] [address]    Report Status 07/11/2019 FINAL  Final  Anaerobic culture     Status: None   Collection Time: 07/08/19  3:16 PM   Specimen: CSF; Cerebrospinal Fluid  Result Value Ref Range Status    Specimen Description CSF  Final   Special Requests NONE  Final   Culture   Final    NO ANAEROBES ISOLATED Performed at [facility], [place] [address], [place], [place] [address]    Report Status 07/13/2019 FINAL  Final  CSF culture     Status: None   Collection Time: 07/08/19  3:16 PM   Specimen: CSF; Cerebrospinal Fluid  Result Value Ref Range Status   Specimen Description CSF  Final   Special Requests NONE  Final   Gram Stain   Final    WBC PRESENT, PREDOMINANTLY MONONUCLEAR NO ORGANISMS SEEN CYTOSPIN SMEAR    Culture   Final    NO GROWTH 3 DAYS Performed at [facility], [place] [address] [place], [place] [address]    Report Status 07/11/2019 FINAL  Final  Fungus Culture With Stain     Status: None (Preliminary result)   Collection Time: 07/08/19  3:16 PM   Specimen: CSF; Cerebrospinal Fluid  Result Value Ref Range Status   Fungus Stain Final report  Final    Comment: (NOTE) Performed At: [facility] [place], [place] [id number] Rush Farmer MD [id number]    Fungus (Mycology) Culture  PENDING  Incomplete   Fungal Source CSF  Final    Comment: Performed at [facility], [place] [address] [place], [place] [address]  Fungus Culture Result     Status: None   Collection Time: 07/08/19  3:16 PM  Result Value Ref Range Status   Result 1 Comment  Final    Comment: (NOTE) KOH/Calcofluor preparation:  no fungus observed. Performed At: [facility] [place], [place] [id number] Rush Farmer MD [id number]   Culture, Urine     Status: Abnormal   Collection Time: 07/10/19  9:08 AM   Specimen: Urine, Random  Result Value Ref Range Status   Specimen Description URINE, RANDOM  Final   Special Requests NONE  Final   Culture (A)  Final    <10,000 COLONIES/mL INSIGNIFICANT GROWTH Performed at [facility], [address], [place], [place] [address]    Report Status 07/11/2019 FINAL  Final  Culture, blood  (routine x 2)     Status: None   Collection Time: 07/10/19 10:21 AM   Specimen: BLOOD RIGHT HAND  Result Value Ref Range Status   Specimen Description BLOOD RIGHT HAND  Final   Special Requests   Final    BOTTLES DRAWN AEROBIC AND ANAEROBIC Blood Culture adequate volume   Culture   Final    NO GROWTH 5 DAYS Performed at [facility], [place] [address] [place], [place] [address]    Report Status 07/15/2019 FINAL  Final  Culture, blood (routine x 2)     Status: None   Collection Time: 07/10/19 10:41 AM   Specimen: BLOOD LEFT HAND  Result Value Ref Range Status   Specimen Description BLOOD LEFT HAND  Final   Special Requests   Final    BOTTLES DRAWN AEROBIC AND ANAEROBIC Blood Culture results may not be optimal due to an inadequate volume of blood received in culture bottles   Culture   Final    NO GROWTH 5 DAYS Performed at [facility], [place] [address], [place], [place] [address]    Report Status 07/15/2019 FINAL  Final  SARS Coronavirus 2 [facility] order, Performed in [place] hospital lab)     Status: None   Collection Time: 07/11/19  4:39 PM   Specimen: Nasopharyngeal Swab  Result Value Ref Range Status   SARS Coronavirus 2 NEGATIVE NEGATIVE Final    Comment: (NOTE) If result is NEGATIVE SARS-CoV-2 target nucleic acids are NOT DETECTED. The SARS-CoV-2 RNA is generally detectable in upper and lower  respiratory specimens during the acute phase of infection. The lowest  concentration of SARS-CoV-2 viral copies this assay can detect is 250  copies / mL. A negative result does not preclude SARS-CoV-2 infection  and should not be used as the sole basis for treatment or other  patient management decisions.  A negative result may occur with  improper specimen collection / handling, submission of specimen other  than nasopharyngeal swab, presence of viral mutation(s) within the  areas targeted by this assay, and inadequate number of viral copies  (<250 copies / mL). A  negative result must be combined with clinical  observations, patient history, and epidemiological information. If result is POSITIVE SARS-CoV-2 target nucleic acids are DETECTED. The SARS-CoV-2 RNA is generally detectable in upper and lower  respiratory specimens dur ing the acute phase of infection.  Positive  results are indicative of active infection with SARS-CoV-2.  Clinical  correlation with patient history and other  diagnostic information is  necessary to determine patient infection status.  Positive results do  not rule out bacterial infection or co-infection with other viruses. If result is PRESUMPTIVE POSTIVE SARS-CoV-2 nucleic acids MAY BE PRESENT.   A presumptive positive result was obtained on the submitted specimen  and confirmed on repeat testing.  While 2019 novel coronavirus  (SARS-CoV-2) nucleic acids may be present in the submitted sample  additional confirmatory testing may be necessary for epidemiological  and / or clinical management purposes  to differentiate between  SARS-CoV-2 and other Sarbecovirus currently known to infect humans.  If clinically indicated additional testing with an alternate test  methodology [phone]) is advised. The SARS-CoV-2 RNA is generally  detectable in upper and lower respiratory sp ecimens during the acute  phase of infection. The expected result is Negative. Fact Sheet for Patients:  StrictlyIdeas.no Fact Sheet for Healthcare Providers: BankingDealers.co.za This test is not yet approved or cleared by the [place] FDA and has been authorized for detection and/or diagnosis of SARS-CoV-2 by FDA under an Emergency Use Authorization (EUA).  This EUA will remain in effect (meaning this test can be used) for the duration of the COVID-19 declaration under Section 564(b)(1) of the Act, 21 U.S.C. section 360bbb-3(b)(1), unless the authorization is terminated or revoked sooner. Performed  at [facility], [place] [address] [place], [place] [address]   Respiratory Panel by PCR     Status: None   Collection Time: 07/12/19 10:06 AM   Specimen: Nasopharyngeal Swab; Respiratory  Result Value Ref Range Status   Adenovirus NOT DETECTED NOT DETECTED Final   Coronavirus 229E NOT DETECTED NOT DETECTED Final    Comment: (NOTE) The Coronavirus on the Respiratory Panel, DOES NOT test for the novel  Coronavirus (2019 nCoV)    Coronavirus HKU1 NOT DETECTED NOT DETECTED Final   Coronavirus NL63 NOT DETECTED NOT DETECTED Final   Coronavirus OC43 NOT DETECTED NOT DETECTED Final   Metapneumovirus NOT DETECTED NOT DETECTED Final   Rhinovirus / Enterovirus NOT DETECTED NOT DETECTED Final   Influenza A NOT DETECTED NOT DETECTED Final   Influenza B NOT DETECTED NOT DETECTED Final   Parainfluenza Virus 1 NOT DETECTED NOT DETECTED Final   Parainfluenza Virus 2 NOT DETECTED NOT DETECTED Final   Parainfluenza Virus 3 NOT DETECTED NOT DETECTED Final   Parainfluenza Virus 4 NOT DETECTED NOT DETECTED Final   Respiratory Syncytial Virus NOT DETECTED NOT DETECTED Final   Bordetella pertussis NOT DETECTED NOT DETECTED Final   Chlamydophila pneumoniae NOT DETECTED NOT DETECTED Final   Mycoplasma pneumoniae NOT DETECTED NOT DETECTED Final    Comment: Performed at [facility], [place]. [address], [place], [place] [address]  C difficile quick scan w PCR reflex     Status: None   Collection Time: 07/15/19  9:11 AM   Specimen: STOOL  Result Value Ref Range Status   C Diff antigen NEGATIVE NEGATIVE Final   C Diff toxin NEGATIVE NEGATIVE Final   C Diff interpretation No C. difficile detected.  Final    Comment: Performed at [facility], [place] [address], [place], [place] [address]    Studies/Results: No results found.    Assessment/Plan:  INTERVAL HISTORY:   C. difficile test is negative  Principal Problem:   PRES (posterior reversible encephalopathy syndrome) Active Problems:    AKI (acute kidney injury) ([place])   Hypercalcemia   Hypomagnesemia, chronic   Multifocal pneumonia    Rhonda Hale is a 73 y.o.  female with metastatic ovarian cancer on paclitaxel who came in with fever and altered mental status had evidence of PR ES on imaging.  She had been on acyclovir briefly but developed acute renal failure.  She has been now found to have shortness of breath hypoxia and bilateral infiltrates on CT scan.  She is COVID-19 negative.  She is being treated empirically for PCP pneumonia with Bactrim and steroids she was also on cefepime.  Has had loose stools.  1.  Multifocal pneumonia being treated as presumptive PCP: Would continue Bactrim and steroids and discontinue cefepime  Hopefully she continues to improve but should she turned a corner for the worse she should be seen by pulmonary and undergo formal bronchoscopy  2.  Diarrhea: Not C. difficile but could be antibiotic related she apparently has chronic diarrhea but not the incontinence she is having now.  Discontinue cefepime as above   LOS: 12 days   Alcide Evener 07/15/2019, 11:59 AM

## 2019-07-15 NOTE — Progress Notes (Signed)
Occupational Therapy Treatment Patient Details Name: Rhonda Hale MRN: [id number] DOB: 1946-05-05 Today's Date: 07/15/2019    History of present illness Rhonda Hale is a 73 y.o. woman with PMHx significant for ovarian cancer status post hysterectomy and splenectomy, on chemotherapy, presented to ED with fever and chills, maximum temperature 103F, and nontraumatic fall at home prior to arrival. Associated symptoms include decreased appetite, decreased oral intake, fatigue and weakness for two days.    OT comments  Pt progressing toward established goals. Pt currently requires minguard-minA for ADL and functional mobility. Pt on 1lnc throughout session able to maintain SpO2 >95%. Pt more alert throughout session reports she has been sleeping well. Pt will continue to benefit from skilled OT services to maximize safety and independence with ADL/IADL and functional mobility. Will continue to follow acutely and progress as tolerated.     Follow Up Recommendations  Home health OT;Supervision/Assistance - 24 hour    Equipment Recommendations  3 in 1 bedside commode    Recommendations for Other Services      Precautions / Restrictions Precautions Precautions: Fall Restrictions Weight Bearing Restrictions: No       Mobility Bed Mobility Overal bed mobility: Needs Assistance Bed Mobility: Supine to Sit     Supine to sit: Min assist;HOB elevated     General bed mobility comments: minA to progress trunk upright  Transfers Overall transfer level: Needs assistance Equipment used: 1 person hand held assist Transfers: Sit to/from [facility] Sit to Stand: Min guard Stand pivot transfers: Min assist       General transfer comment: minA for stability    Balance Overall balance assessment: Needs assistance Sitting-balance support: No upper extremity supported;Feet supported Sitting balance-Leahy Scale: Fair     Standing balance support: No upper extremity  supported;Single extremity supported;During functional activity Standing balance-Leahy Scale: Fair Standing balance comment: intermittent single UE support for stability                            ADL either performed or assessed with clinical judgement   ADL Overall ADL's : Needs assistance/impaired Eating/Feeding: Set up;Sitting                   Lower Body Dressing: Min guard;Minimal assistance;Sit to/from stand Lower Body Dressing Details (indicate cue type and reason): assist to don socks;minguard for sit<>stand Toilet Transfer: Minimal assistance;Min guard;Stand-pivot Toilet Transfer Details (indicate cue type and reason): standpivot from EOB to recliner Toileting- Clothing Manipulation and Hygiene: Minimal assistance;Min guard;Sit to/from stand Toileting - Clothing Manipulation Details (indicate cue type and reason): minguard sit<>stand;minA for stability with initial standing     Functional mobility during ADLs: Min guard;Minimal assistance       Vision       Perception     Praxis      Cognition Arousal/Alertness: Awake/alert Behavior During Therapy: WFL for tasks assessed/performed Overall Cognitive Status: Within Functional Limits for tasks assessed                                 General Comments: pt much more alert this date, she reports she has been sleeping well        Exercises     Shoulder Instructions       General Comments pt on 1lnc Spo2 >95% throughout session;pt appeared less anxious with mobility this date    Pertinent Vitals/ Pain  Pain Assessment: No/denies pain  Home Living                                          Prior Functioning/Environment              Frequency  Min 2X/week        Progress Toward Goals  OT Goals(current goals can now be found in the care plan section)  Progress towards OT goals: Progressing toward goals  Acute Rehab OT Goals Patient Stated  Goal: go home OT Goal Formulation: With patient Time For Goal Achievement: 07/18/19 Potential to Achieve Goals: Good ADL Goals Pt Will Perform Lower Body Bathing: with modified independence;with adaptive equipment;sit to/from stand Pt Will Perform Lower Body Dressing: with modified independence;with adaptive equipment;sit to/from stand Pt Will Transfer to Toilet: with modified independence;ambulating;bedside commode Additional ADL Goal #1: Pt will be able to identify and implement at least 3 energy conservation strategies during ADLs.  Plan Discharge plan remains appropriate    Co-evaluation                 AM-PAC OT "6 Clicks" Daily Activity     Outcome Measure   Help from another person eating meals?: A Little Help from another person taking care of personal grooming?: A Little Help from another person toileting, which includes using toliet, bedpan, or urinal?: A Little Help from another person bathing (including washing, rinsing, drying)?: A Little Help from another person to put on and taking off regular upper body clothing?: A Little Help from another person to put on and taking off regular lower body clothing?: A Little 6 Click Score: 18    End of Session    OT Visit Diagnosis: Muscle weakness (generalized) (M62.81)   Activity Tolerance Patient tolerated treatment well   Patient Left in chair;with call bell/phone within reach;with family/visitor present   Nurse Communication Mobility status        Time: 8811-0315 OT Time Calculation (min): 33 min  Charges: OT General Charges $OT Visit: 1 Visit OT Treatments $Self Care/Home Management : 23-37 mins  Dorinda Hill OTR/L Acute Rehabilitation Services Office: [phone]    Wyn Forster 07/15/2019, 12:55 PM

## 2019-07-15 NOTE — Progress Notes (Signed)
Physical Therapy Treatment Patient Details Name: Rhonda Hale MRN: [id number] DOB: Aug 06, 1946 Today's Date: 07/15/2019    History of Present Illness Rhonda Hale is a 73 y.o. woman with PMHx significant for ovarian cancer status post hysterectomy and splenectomy, on chemotherapy, presented to ED with fever and chills, maximum temperature 103F, and nontraumatic fall at home prior to arrival. Associated symptoms include decreased appetite, decreased oral intake, fatigue and weakness for two days.     PT Comments    Pt progressing towards goals, however, continues to exhibit increased fatigued. Required seated rest X 2 during short distance gait within the room. Improved steadiness noted with use of RW. Feel pt would benefit from use of rollator at home and pt's daughter reports she has one at home. Current recommendations appropriate. Will continue to follow acutely to maximize functional mobility independence and safety.    Follow Up Recommendations  Home health PT;Supervision/Assistance - 24 hour     Equipment Recommendations  None recommended by PT    Recommendations for Other Services       Precautions / Restrictions Precautions Precautions: Fall Restrictions Weight Bearing Restrictions: No    Mobility  Bed Mobility Overal bed mobility: Needs Assistance Bed Mobility: Sit to Supine       Sit to supine: Min assist   General bed mobility comments: Min A for LE assist for return to supine.   Transfers Overall transfer level: Needs assistance Equipment used: Rolling walker (2 wheeled);1 person hand held assist Transfers: Sit to/from Stand Sit to Stand: Min guard         General transfer comment: Min guard for safety to stand with HHA and with RW. Cues for safe hand placement.   Ambulation/Gait Ambulation/Gait assistance: Min guard;Min assist Gait Distance (Feet): 15 Feet Assistive device: 1 person hand held assist;Rolling walker (2 wheeled) Gait Pattern/deviations:  Step-through pattern;Decreased stride length Gait velocity: Decreased   General Gait Details: Min A without AD for steadying. Improved steadiness noted with use of RW. Pt limited secondary to fatigue and required seated rest X2. Oxygen sats >92% on RA throughout mobility.    Stairs             Wheelchair Mobility    Modified Rankin (Stroke Patients Only)       Balance Overall balance assessment: Needs assistance Sitting-balance support: No upper extremity supported;Feet supported Sitting balance-Leahy Scale: Fair     Standing balance support: No upper extremity supported;Single extremity supported Standing balance-Leahy Scale: Poor Standing balance comment: Reliant on at least 1 UE for support                             Cognition Arousal/Alertness: Awake/alert Behavior During Therapy: WFL for tasks assessed/performed Overall Cognitive Status: Within Functional Limits for tasks assessed                                        Exercises      General Comments General comments (skin integrity, edema, etc.): Educated about use of rollator at home to improve safety.       Pertinent Vitals/Pain Pain Assessment: No/denies pain    Home Living                      Prior Function            PT Goals (current  goals can now be found in the care plan section) Acute Rehab PT Goals Patient Stated Goal: go home PT Goal Formulation: With patient/family Time For Goal Achievement: 07/18/19 Potential to Achieve Goals: Good Progress towards PT goals: Progressing toward goals    Frequency    Min 3X/week      PT Plan Current plan remains appropriate    Co-evaluation              AM-PAC PT "6 Clicks" Mobility   Outcome Measure  Help needed turning from your back to your side while in a flat bed without using bedrails?: A Little Help needed moving from lying on your back to sitting on the side of a flat bed without using  bedrails?: A Little Help needed moving to and from a bed to a chair (including a wheelchair)?: A Little Help needed standing up from a chair using your arms (e.g., wheelchair or bedside chair)?: A Little Help needed to walk in hospital room?: A Lot Help needed climbing 3-5 steps with a railing? : A Lot 6 Click Score: 16    End of Session Equipment Utilized During Treatment: Gait belt Activity Tolerance: Patient limited by fatigue Patient left: in bed;with call bell/phone within reach;with family/visitor present Nurse Communication: Mobility status PT Visit Diagnosis: Unsteadiness on feet (R26.81)     Time: 3818-2993 PT Time Calculation (min) (ACUTE ONLY): 38 min  Charges:  $Gait Training: 23-37 mins $Therapeutic Activity: 8-22 mins                     Leighton Ruff, PT, DPT  Acute Rehabilitation Services  Pager: [phone] Office: [phone]    [name] 07/15/2019, 5:13 PM

## 2019-07-16 DIAGNOSIS — R197 Diarrhea, unspecified: Secondary | ICD-10-CM

## 2019-07-16 LAB — CBC WITH DIFFERENTIAL/PLATELET
Abs Immature Granulocytes: 1 10*3/uL — ABNORMAL HIGH (ref 0.00–0.07)
Basophils Absolute: 0 10*3/uL (ref 0.0–0.1)
Basophils Relative: 0 %
Eosinophils Absolute: 0 10*3/uL (ref 0.0–0.5)
Eosinophils Relative: 0 %
HCT: 26.2 % — ABNORMAL LOW (ref 36.0–46.0)
Hemoglobin: 8.7 g/dL — ABNORMAL LOW (ref 12.0–15.0)
Lymphocytes Relative: 8 %
Lymphs Abs: 1.5 10*3/uL (ref 0.7–4.0)
MCH: 32.5 pg (ref 26.0–34.0)
MCHC: 33.2 g/dL (ref 30.0–36.0)
MCV: 97.8 fL (ref 80.0–100.0)
Metamyelocytes Relative: 3 %
Monocytes Absolute: 0 10*3/uL — ABNORMAL LOW (ref 0.1–1.0)
Monocytes Relative: 0 %
Myelocytes: 2 %
Neutro Abs: 16.6 10*3/uL — ABNORMAL HIGH (ref 1.7–7.7)
Neutrophils Relative %: 87 %
Platelets: 422 10*3/uL — ABNORMAL HIGH (ref 150–400)
RBC: 2.68 MIL/uL — ABNORMAL LOW (ref 3.87–5.11)
RDW: 23.8 % — ABNORMAL HIGH (ref 11.5–15.5)
WBC: 19.1 10*3/uL — ABNORMAL HIGH (ref 4.0–10.5)
nRBC: 4.8 % — ABNORMAL HIGH (ref 0.0–0.2)
nRBC: 8 /100 WBC — ABNORMAL HIGH

## 2019-07-16 LAB — TYPE AND SCREEN
ABO/RH(D): A POS
Antibody Screen: NEGATIVE
Unit division: 0

## 2019-07-16 LAB — BPAM RBC
Blood Product Expiration Date: 202008242359
ISSUE DATE / TIME: 202008031356
Unit Type and Rh: 6200

## 2019-07-16 LAB — PNEUMOCYSTIS JIROVECI SMEAR BY DFA: Pneumocystis jiroveci Ag: NEGATIVE

## 2019-07-16 MED ORDER — PREDNISONE 20 MG PO TABS: 20.0000 mg | ORAL_TABLET | Freq: Every day | ORAL | Status: DC

## 2019-07-16 MED ORDER — ONDANSETRON HCL 4 MG/2ML IJ SOLN
4.0000 mg | Freq: Once | INTRAMUSCULAR | Status: AC
  Administered 2019-07-16: 4 mg via INTRAVENOUS
  Filled 2019-07-16: qty 2

## 2019-07-16 MED ORDER — SULFAMETHOXAZOLE-TRIMETHOPRIM 800-160 MG PO TABS
2.0000 | ORAL_TABLET | Freq: Three times a day (TID) | ORAL | Status: DC
  Administered 2019-07-16 – 2019-07-17 (×4): 2 via ORAL
  Filled 2019-07-16 (×4): qty 2

## 2019-07-16 NOTE — Care Management Important Message (Signed)
Important Message  Patient Details  Name: Rhonda Hale MRN: [id number] Date of Birth: [birth year]   Medicare Important Message Given:  Yes     [name] 07/16/2019, 1:25 PM

## 2019-07-16 NOTE — Progress Notes (Addendum)
Subjective: Patient feeling better and is off O2   Antibiotics:  Anti-infectives (From admission, onward)   Start     Dose/Rate Route Frequency Ordered Stop   07/16/19 1400  sulfamethoxazole-trimethoprim (BACTRIM DS) 800-160 MG per tablet 2 tablet     2 tablet Oral Every 8 hours 07/16/19 0939 08/01/19 2359   07/15/19 1200  sulfamethoxazole-trimethoprim (BACTRIM) 235.2 mg in dextrose 5 % 250 mL IVPB  Status:  Discontinued     15 mg/kg/day  62.7 kg (Adjusted) 264.7 mL/hr over 60 Minutes Intravenous Every 6 hours 07/15/19 0909 07/16/19 0939   07/14/19 1000  ceFEPIme (MAXIPIME) 2 g in sodium chloride 0.9 % 100 mL IVPB  Status:  Discontinued     2 g 200 mL/hr over 30 Minutes Intravenous Every 12 hours 07/14/19 0740 07/15/19 0914   07/12/19 1200  sulfamethoxazole-trimethoprim (BACTRIM) 156.8 mg in dextrose 5 % 250 mL IVPB  Status:  Discontinued     10 mg/kg/day  62.7 kg (Adjusted) 259.8 mL/hr over 60 Minutes Intravenous Every 6 hours 07/12/19 0923 07/15/19 0909   07/10/19 1000  linezolid (ZYVOX) IVPB 600 mg  Status:  Discontinued     600 mg 300 mL/hr over 60 Minutes Intravenous Every 12 hours 07/10/19 0926 07/11/19 1538   07/10/19 0930  ceFEPIme (MAXIPIME) 2 g in sodium chloride 0.9 % 100 mL IVPB  Status:  Discontinued     2 g 200 mL/hr over 30 Minutes Intravenous Every 24 hours 07/10/19 0926 07/14/19 0740   07/09/19 2200  acyclovir (ZOVIRAX) 615 mg in dextrose 5 % 100 mL IVPB  Status:  Discontinued     615 mg 112.3 mL/hr over 60 Minutes Intravenous Every 24 hours 07/09/19 0913 07/09/19 1027   07/09/19 0630  azithromycin (ZITHROMAX) 500 mg in sodium chloride 0.9 % 250 mL IVPB  Status:  Discontinued     500 mg 250 mL/hr over 60 Minutes Intravenous Every 24 hours 07/09/19 0526 07/11/19 1538   07/09/19 0530  cefTRIAXone (ROCEPHIN) 2 g in sodium chloride 0.9 % 100 mL IVPB  Status:  Discontinued     2 g 200 mL/hr over 30 Minutes Intravenous Every 24 hours 07/09/19 0526 07/10/19  0911   07/06/19 1415  acyclovir (ZOVIRAX) 615 mg in dextrose 5 % 100 mL IVPB  Status:  Discontinued     615 mg 112.3 mL/hr over 60 Minutes Intravenous Every 12 hours 07/06/19 1412 07/09/19 0913   07/05/19 1400  piperacillin-tazobactam (ZOSYN) IVPB 3.375 g  Status:  Discontinued     3.375 g 12.5 mL/hr over 240 Minutes Intravenous Every 8 hours 07/05/19 1214 07/06/19 1526   07/05/19 1130  vancomycin (VANCOCIN) 1,250 mg in sodium chloride 0.9 % 250 mL IVPB  Status:  Discontinued     1,250 mg 166.7 mL/hr over 90 Minutes Intravenous Every 48 hours 07/03/19 1130 07/04/19 1018   07/03/19 2230  ceFEPIme (MAXIPIME) 2 g in sodium chloride 0.9 % 100 mL IVPB  Status:  Discontinued     2 g 200 mL/hr over 30 Minutes Intravenous Every 12 hours 07/03/19 1130 07/05/19 1201   07/03/19 1130  vancomycin (VANCOCIN) 1,750 mg in sodium chloride 0.9 % 500 mL IVPB     1,750 mg 250 mL/hr over 120 Minutes Intravenous  Once 07/03/19 1029 07/03/19 1630   07/03/19 1130  ceFEPIme (MAXIPIME) 2 g in sodium chloride 0.9 % 100 mL IVPB     2 g 200 mL/hr over 30 Minutes Intravenous  Once 07/03/19  1029 07/03/19 1128      Medications: Scheduled Meds: . sodium chloride   Intravenous Once  . amLODipine  10 mg Oral Daily  . calcium-vitamin D  1 tablet Oral BID  . escitalopram  20 mg Oral Daily  . feeding supplement  1 Container Oral TID BM  . feeding supplement (PRO-STAT SUGAR FREE 64)  30 mL Oral BID WC  . folic acid  1 mg Oral Daily  . Gerhardt's butt cream   Topical TID  . labetalol  50 mg Oral BID  . levothyroxine  137 mcg Oral QAC breakfast  . lidocaine   Topical Once  . magnesium oxide  800 mg Oral BID  . multivitamin with minerals  1 tablet Oral Daily  . potassium chloride  20 mEq Oral BID  . predniSONE  40 mg Oral Q breakfast  . rivaroxaban  20 mg Oral Q supper  . sodium chloride flush  3 mL Intravenous Q12H  . sulfamethoxazole-trimethoprim  2 tablet Oral Q8H   Continuous Infusions: . sodium chloride  Stopped (07/16/19 0622)   PRN Meds:.sodium chloride, acetaminophen **OR** acetaminophen, hydrALAZINE, ipratropium-albuterol, labetalol, lidocaine-prilocaine, ondansetron **OR** ondansetron (ZOFRAN) IV, senna-docusate, sodium chloride flush    Objective: Weight change: -8.8 kg  Intake/Output Summary (Last 24 hours) at 07/16/2019 0949 Last data filed at 07/16/2019 0700 Gross per 24 hour  Intake 2262.99 ml  Output 1380 ml  Net 882.99 ml   Blood pressure (!) 124/59, pulse 80, temperature 97.9 F (36.6 C), temperature source Axillary, resp. rate (!) 25, height 5\' 2"  (1.575 m), weight 80.4 kg, SpO2 91 %. Temp:  [97.8 F (36.6 C)-98.4 F (36.9 C)] 97.9 F (36.6 C) (08/04 0829) Pulse Rate:  [72-84] 80 (08/04 0829) Resp:  [16-26] 25 (08/04 0829) BP: (92-130)/(43-72) 124/59 (08/04 0829) SpO2:  [91 %-96 %] 91 % (08/04 0829) Weight:  [80.4 kg] 80.4 kg (08/04 0636)  Physical Exam: General: Alert and awake, oriented x3, not in any acute distress. HEENT: anicteric sclera, EOMI CVS regular rate, normal  Chest: , no wheezing, no respiratory distress, lungs fairly clear anteriorly and posteriorly Abdomen: soft non-distended,  Extremities: no edema or deformity noted bilaterally Skin: no rashes Neuro: nonfocal  CBC:    BMET Recent Labs    07/14/19 0435 07/15/19 0430  NA 138 139  K 3.4* 4.6  CL 106 109  CO2 21* 21*  GLUCOSE 84 115*  BUN 6* 10  CREATININE 1.17* 1.20*  CALCIUM 7.5* 7.2*     Liver Panel  Recent Labs    07/14/19 0435  ALBUMIN 2.0*       Sedimentation Rate No results for input(s): ESRSEDRATE in the last 72 hours. C-Reactive Protein No results for input(s): CRP in the last 72 hours.  Micro Results: Recent Results (from the past 720 hour(s))  Blood Culture (routine x 2)     Status: None   Collection Time: 07/03/19 10:00 AM   Specimen: BLOOD RIGHT HAND  Result Value Ref Range Status   Specimen Description BLOOD RIGHT HAND  Final   Special Requests    Final    BOTTLES DRAWN AEROBIC AND ANAEROBIC Blood Culture adequate volume   Culture   Final    NO GROWTH 5 DAYS Performed at [facility], [address] [place], [place] [address]    Report Status 07/08/2019 FINAL  Final  Blood Culture (routine x 2)     Status: None   Collection Time: 07/03/19 10:07 AM   Specimen: BLOOD RIGHT  WRIST  Result Value Ref Range Status   Specimen Description BLOOD RIGHT WRIST  Final   Special Requests   Final    BOTTLES DRAWN AEROBIC AND ANAEROBIC Blood Culture results may not be optimal due to an inadequate volume of blood received in culture bottles   Culture   Final    NO GROWTH 5 DAYS Performed at [facility], [place] [address] [place], [place] [address]    Report Status 07/08/2019 FINAL  Final  SARS Coronavirus 2 (CEPHEID- Performed in [place] hospital lab), Hosp Order     Status: None   Collection Time: 07/03/19 10:13 AM   Specimen: Nasopharyngeal Swab  Result Value Ref Range Status   SARS Coronavirus 2 NEGATIVE NEGATIVE Final    Comment: (NOTE) If result is NEGATIVE SARS-CoV-2 target nucleic acids are NOT DETECTED. The SARS-CoV-2 RNA is generally detectable in upper and lower  respiratory specimens during the acute phase of infection. The lowest  concentration of SARS-CoV-2 viral copies this assay can detect is 250  copies / mL. A negative result does not preclude SARS-CoV-2 infection  and should not be used as the sole basis for treatment or other  patient management decisions.  A negative result may occur with  improper specimen collection / handling, submission of specimen other  than nasopharyngeal swab, presence of viral mutation(s) within the  areas targeted by this assay, and inadequate number of viral copies  (<250 copies / mL). A negative result must be combined with clinical  observations, patient history, and epidemiological information. If result is POSITIVE SARS-CoV-2 target nucleic acids are DETECTED. The  SARS-CoV-2 RNA is generally detectable in upper and lower  respiratory specimens dur ing the acute phase of infection.  Positive  results are indicative of active infection with SARS-CoV-2.  Clinical  correlation with patient history and other diagnostic information is  necessary to determine patient infection status.  Positive results do  not rule out bacterial infection or co-infection with other viruses. If result is PRESUMPTIVE POSTIVE SARS-CoV-2 nucleic acids MAY BE PRESENT.   A presumptive positive result was obtained on the submitted specimen  and confirmed on repeat testing.  While 2019 novel coronavirus  (SARS-CoV-2) nucleic acids may be present in the submitted sample  additional confirmatory testing may be necessary for epidemiological  and / or clinical management purposes  to differentiate between  SARS-CoV-2 and other Sarbecovirus currently known to infect humans.  If clinically indicated additional testing with an alternate test  methodology [phone]) is advised. The SARS-CoV-2 RNA is generally  detectable in upper and lower respiratory sp ecimens during the acute  phase of infection. The expected result is Negative. Fact Sheet for Patients:  StrictlyIdeas.no Fact Sheet for Healthcare Providers: BankingDealers.co.za This test is not yet approved or cleared by the [place] FDA and has been authorized for detection and/or diagnosis of SARS-CoV-2 by FDA under an Emergency Use Authorization (EUA).  This EUA will remain in effect (meaning this test can be used) for the duration of the COVID-19 declaration under Section 564(b)(1) of the Act, 21 U.S.C. section 360bbb-3(b)(1), unless the authorization is terminated or revoked sooner. Performed at [facility], [place] [address], [place], [place] [address]   Urine culture     Status: None   Collection Time: 07/03/19  4:11 PM   Specimen: In/Out Cath Urine  Result Value  Ref Range Status   Specimen Description IN/OUT CATH URINE  Final   Special Requests NONE  Final  Culture   Final    NO GROWTH Performed at [facility], [place] [address] [place], [place] [address]    Report Status 07/04/2019 FINAL  Final  MRSA PCR Screening     Status: None   Collection Time: 07/04/19  7:48 AM   Specimen: Nasal Mucosa; Nasopharyngeal  Result Value Ref Range Status   MRSA by PCR NEGATIVE NEGATIVE Final    Comment:        The GeneXpert MRSA Assay (FDA approved for NASAL specimens only), is one component of a comprehensive MRSA colonization surveillance program. It is not intended to diagnose MRSA infection nor to guide or monitor treatment for MRSA infections. Performed at [facility], [place] [address], [place], [place] [address]   Culture, blood (routine x 2)     Status: None   Collection Time: 07/05/19  7:12 AM   Specimen: BLOOD  Result Value Ref Range Status   Specimen Description BLOOD RIGHT ANTECUBITAL  Final   Special Requests   Final    BOTTLES DRAWN AEROBIC AND ANAEROBIC Blood Culture adequate volume   Culture   Final    NO GROWTH 5 DAYS Performed at [facility], [place] [address], [place], [place] [address]    Report Status 07/10/2019 FINAL  Final  Culture, blood (routine x 2)     Status: None   Collection Time: 07/05/19  7:17 AM   Specimen: BLOOD RIGHT HAND  Result Value Ref Range Status   Specimen Description BLOOD RIGHT HAND  Final   Special Requests   Final    BOTTLES DRAWN AEROBIC ONLY Blood Culture adequate volume   Culture   Final    NO GROWTH 5 DAYS Performed at [facility], [place] [address], [place], [place] [address]    Report Status 07/10/2019 FINAL  Final  Culture, blood (routine x 2)     Status: None   Collection Time: 07/06/19  3:08 AM   Specimen: BLOOD RIGHT HAND  Result Value Ref Range Status   Specimen Description BLOOD RIGHT HAND  Final   Special Requests   Final    BOTTLES DRAWN AEROBIC AND ANAEROBIC  Blood Culture adequate volume   Culture   Final    NO GROWTH 5 DAYS Performed at [facility], [place] [address], [place], [place] [address]    Report Status 07/11/2019 FINAL  Final  Anaerobic culture     Status: None   Collection Time: 07/08/19  3:16 PM   Specimen: CSF; Cerebrospinal Fluid  Result Value Ref Range Status   Specimen Description CSF  Final   Special Requests NONE  Final   Culture   Final    NO ANAEROBES ISOLATED Performed at [facility], [place] [address] [place], [place] [address]    Report Status 07/13/2019 FINAL  Final  CSF culture     Status: None   Collection Time: 07/08/19  3:16 PM   Specimen: CSF; Cerebrospinal Fluid  Result Value Ref Range Status   Specimen Description CSF  Final   Special Requests NONE  Final   Gram Stain   Final    WBC PRESENT, PREDOMINANTLY MONONUCLEAR NO ORGANISMS SEEN CYTOSPIN SMEAR    Culture   Final    NO GROWTH 3 DAYS Performed at [facility], [place] [address], [place], [place] [address]    Report Status 07/11/2019 FINAL  Final  Fungus Culture With Stain     Status: None (  Preliminary result)   Collection Time: 07/08/19  3:16 PM   Specimen: CSF; Cerebrospinal Fluid  Result Value Ref Range Status   Fungus Stain Final report  Final    Comment: (NOTE) Performed At: [facility] [place], [place] [id number] Rush Farmer MD [id number]    Fungus (Mycology) Culture PENDING  Incomplete   Fungal Source CSF  Final    Comment: Performed at [facility], [place] [address] [place], [place] [address]  Fungus Culture Result     Status: None   Collection Time: 07/08/19  3:16 PM  Result Value Ref Range Status   Result 1 Comment  Final    Comment: (NOTE) KOH/Calcofluor preparation:  no fungus observed. Performed At: [facility] [place], [place] [id number] Rush Farmer MD OV:[id number]   Culture, Urine     Status: Abnormal   Collection Time: 07/10/19  9:08 AM    Specimen: Urine, Random  Result Value Ref Range Status   Specimen Description URINE, RANDOM  Final   Special Requests NONE  Final   Culture (A)  Final    <10,000 COLONIES/mL INSIGNIFICANT GROWTH Performed at [facility], [address], [place], [place] [address]    Report Status 07/11/2019 FINAL  Final  Culture, blood (routine x 2)     Status: None   Collection Time: 07/10/19 10:21 AM   Specimen: BLOOD RIGHT HAND  Result Value Ref Range Status   Specimen Description BLOOD RIGHT HAND  Final   Special Requests   Final    BOTTLES DRAWN AEROBIC AND ANAEROBIC Blood Culture adequate volume   Culture   Final    NO GROWTH 5 DAYS Performed at [facility], [place] [address] [place], [place] [address]    Report Status 07/15/2019 FINAL  Final  Culture, blood (routine x 2)     Status: None   Collection Time: 07/10/19 10:41 AM   Specimen: BLOOD LEFT HAND  Result Value Ref Range Status   Specimen Description BLOOD LEFT HAND  Final   Special Requests   Final    BOTTLES DRAWN AEROBIC AND ANAEROBIC Blood Culture results may not be optimal due to an inadequate volume of blood received in culture bottles   Culture   Final    NO GROWTH 5 DAYS Performed at [facility], [place] [address], [place], [place] [address]    Report Status 07/15/2019 FINAL  Final  SARS Coronavirus 2 [facility] order, Performed in [place] hospital lab)     Status: None   Collection Time: 07/11/19  4:39 PM   Specimen: Nasopharyngeal Swab  Result Value Ref Range Status   SARS Coronavirus 2 NEGATIVE NEGATIVE Final    Comment: (NOTE) If result is NEGATIVE SARS-CoV-2 target nucleic acids are NOT DETECTED. The SARS-CoV-2 RNA is generally detectable in upper and lower  respiratory specimens during the acute phase of infection. The lowest  concentration of SARS-CoV-2 viral copies this assay can detect is 250  copies / mL. A negative result does not preclude SARS-CoV-2 infection  and should not be used as the  sole basis for treatment or other  patient management decisions.  A negative result may occur with  improper specimen collection / handling, submission of specimen other  than nasopharyngeal swab, presence of viral mutation(s) within the  areas targeted by this assay, and inadequate number of viral copies  (<250 copies / mL). A negative result must be combined with clinical  observations, patient history, and epidemiological information. If result is POSITIVE SARS-CoV-2 target nucleic acids are DETECTED. The SARS-CoV-2 RNA is generally detectable in upper and lower  respiratory specimens dur ing the acute phase of infection.  Positive  results are indicative of active infection with SARS-CoV-2.  Clinical  correlation with patient history and other diagnostic information is  necessary to determine patient infection status.  Positive results do  not rule out bacterial infection or co-infection with other viruses. If result is PRESUMPTIVE POSTIVE SARS-CoV-2 nucleic acids MAY BE PRESENT.   A presumptive positive result was obtained on the submitted specimen  and confirmed on repeat testing.  While 2019 novel coronavirus  (SARS-CoV-2) nucleic acids may be present in the submitted sample  additional confirmatory testing may be necessary for epidemiological  and / or clinical management purposes  to differentiate between  SARS-CoV-2 and other Sarbecovirus currently known to infect humans.  If clinically indicated additional testing with an alternate test  methodology [phone]) is advised. The SARS-CoV-2 RNA is generally  detectable in upper and lower respiratory sp ecimens during the acute  phase of infection. The expected result is Negative. Fact Sheet for Patients:  StrictlyIdeas.no Fact Sheet for Healthcare Providers: BankingDealers.co.za This test is not yet approved or cleared by the [place] FDA and has been authorized for detection  and/or diagnosis of SARS-CoV-2 by FDA under an Emergency Use Authorization (EUA).  This EUA will remain in effect (meaning this test can be used) for the duration of the COVID-19 declaration under Section 564(b)(1) of the Act, 21 U.S.C. section 360bbb-3(b)(1), unless the authorization is terminated or revoked sooner. Performed at [facility], [place] [address], [place], [place] [address]   Respiratory Panel by PCR     Status: None   Collection Time: 07/12/19 10:06 AM   Specimen: Nasopharyngeal Swab; Respiratory  Result Value Ref Range Status   Adenovirus NOT DETECTED NOT DETECTED Final   Coronavirus 229E NOT DETECTED NOT DETECTED Final    Comment: (NOTE) The Coronavirus on the Respiratory Panel, DOES NOT test for the novel  Coronavirus (2019 nCoV)    Coronavirus HKU1 NOT DETECTED NOT DETECTED Final   Coronavirus NL63 NOT DETECTED NOT DETECTED Final   Coronavirus OC43 NOT DETECTED NOT DETECTED Final   Metapneumovirus NOT DETECTED NOT DETECTED Final   Rhinovirus / Enterovirus NOT DETECTED NOT DETECTED Final   Influenza A NOT DETECTED NOT DETECTED Final   Influenza B NOT DETECTED NOT DETECTED Final   Parainfluenza Virus 1 NOT DETECTED NOT DETECTED Final   Parainfluenza Virus 2 NOT DETECTED NOT DETECTED Final   Parainfluenza Virus 3 NOT DETECTED NOT DETECTED Final   Parainfluenza Virus 4 NOT DETECTED NOT DETECTED Final   Respiratory Syncytial Virus NOT DETECTED NOT DETECTED Final   Bordetella pertussis NOT DETECTED NOT DETECTED Final   Chlamydophila pneumoniae NOT DETECTED NOT DETECTED Final   Mycoplasma pneumoniae NOT DETECTED NOT DETECTED Final    Comment: Performed at [facility], [place]. [address], [place], [place] [address]  C difficile quick scan w PCR reflex     Status: None   Collection Time: 07/15/19  9:11 AM   Specimen: STOOL  Result Value Ref Range Status   C Diff antigen NEGATIVE NEGATIVE Final   C Diff toxin NEGATIVE NEGATIVE Final   C Diff interpretation No  C. difficile detected.  Final    Comment: Performed at [facility], [place] [address], [place], [place] [address]    Studies/Results:  No results found.    Assessment/Plan:  INTERVAL HISTORY:   Seems to be improving on empiric therapy for PCP  Principal Problem:   PRES (posterior reversible encephalopathy syndrome) Active Problems:   AKI (acute kidney injury) ([place])   Hypercalcemia   Hypomagnesemia, chronic   Multifocal pneumonia    Rhonda Hale is a 73 y.o. female with metastatic ovarian cancer on paclitaxel who came in with fever and altered mental status had evidence of PR ES on imaging.  She had been on acyclovir briefly but developed acute renal failure.  She has been now found to have shortness of breath hypoxia and bilateral infiltrates on CT scan.  She is COVID-19 negative.  She is being treated empirically for PCP pneumonia with Bactrim and steroids she was also on cefepime.    1.  Multifocal pneumonia being treated as presumptive PCP:   I will change her to oral Bactrim today and I would plan on giving her a total of 21 days of Bactrim along with a prednisone taper  Two DS TID starting today through August 20th, 2020  Would watch CBC and BMP in particular on bactrim  If she is DC would have PCP follow this lab up promptly in next few days and would certainly recheck next week  Prednisone is typically 40mg  bid x 5 days then 40mg  daily x 5 and then 20mg  daily x 11 days  I would just go with 40 daily for 10 days and then 20mg  daily x 11 days  2.  Diarrhea: Not C. Difficile   Rhonda Hale has an appointment on 08/15/2019 at 945 pm and should arrive by 930 she has option to convert to evisit  The [facility] is located in the [facility] at  [address] in [place].  [address], which is located to the left of the elevators.  Phone: [phone]  Fax: [phone]  [email or web address]        LOS: 13 days   Alcide Evener 07/16/2019, 9:49 AM

## 2019-07-16 NOTE — TOC Progression Note (Signed)
Transition of Care [facility]) - Progression Note    Patient Details  Name: Rhonda Hale MRN: [id number] Date of Birth: 01/05/1946  [facility] CM/SW [place], LCSW Phone Number: 07/16/2019, 8:41 AM  Clinical Narrative:    CSW continuing to follow for discharge needs.    Expected Discharge Plan: [place] Barriers to Discharge: Continued Medical Work up  Expected Discharge Plan and Services Expected Discharge Plan: [place] In-house Referral: NA Discharge Planning Services: CM Consult Post Acute Care Choice: Durable Medical Equipment, Home Health Living arrangements for the past 2 months: Single Family Home Expected Discharge Date: 07/05/19                           [facility] Agency: [facility] Date [place]: 07/05/19 Time [place]: [place] Representative spoke with at [place]: Dante ([place]) Interventions    Readmission Risk Interventions Readmission Risk Prevention Plan 07/05/2019  Post Dischage Appt Complete  Medication Screening Complete  Transportation Screening Complete  Some recent data might be hidden

## 2019-07-16 NOTE — Progress Notes (Signed)
Internal Medicine Attending:   I saw and examined the patient. I reviewed Dr Jackson Latino note and I agree with the resident's findings and plan as documented in the resident's note.  She is now off supplemetnal O2, feeling much better. We have discussed her care with Dr Tommy Medal, will continue Bactrim and steroid course for presumed PCP PNA.  She may need continued PPx dose after this course, she will have follow up with Dr Tommy Medal in the ID clinic.  For her Anemia of Chronic disease- her Hgb is stable, we will plan to repeat CBC tomorrow, if stable she will likely be able to be discharged home (with her daughter who is an Therapist, sports).

## 2019-07-16 NOTE — Progress Notes (Addendum)
1044: Call from Cook at [place] center. Given updates. States chemo to be canceled for tomorrow.   1615: Call to MD to see if foley is to be discontinued. States patient will be discharged with foley. Daughter updated.  1620: Went to assess foley, foley partially out urethra, bulb not fully inflated. Foley removed. Daughter Daleen Snook wants to speak with MD to determine why foley has to be in place for discharge tomorrow. Does not want to replace foley until she speaks to physician. MD paged.  1700: Spoke with MD. Patient will not be discharging with foley. Okay to leave foley out if patient voids.

## 2019-07-16 NOTE — Progress Notes (Signed)
Nutrition Follow-up  DOCUMENTATION CODES:   Obesity unspecified  INTERVENTION:   -D/c Boost Breeze po TID, each supplement provides 250 kcal and 9 grams of protein -Continue 30 ml Prostat BID, each supplement provides 100 kcals and 15 grams protein -Continue Magic cup TID with meals, each supplement provides 290 kcal and 9 grams of protein -Continue MVI with minerals daily  NUTRITION DIAGNOSIS:   Increased nutrient needs related to cancer and cancer related treatments as evidenced by estimated needs.  Ongoing  GOAL:   Patient will meet greater than or equal to 90% of their needs  Progressing   MONITOR:   PO intake, Supplement acceptance, Labs, Weight trends, Skin, I & O's  REASON FOR ASSESSMENT:   Consult Assessment of nutrition requirement/status  ASSESSMENT:   Rhonda Hale is a 73 yo F with a PMHx of metastatic ovarian cancer and PE on Xarelto who presents with fever with no infectious sx being treated with broad spectrum antibiotics with blood and urine cx pending.  7/27- s/p lumbar puncture, CXR reveals pneumonia 8/2- rectal tube placed  Reviewed I/O's: +1.1 L x 24 hours and +7.1 L since admission  UOP: 1.2 L x 24 hours  Rectal tube output: 150 ml x 24 hours  Per MD notes, pt with mild hemoptysis. If this does not improve, pt may require pulmonology consult for bronchoscopy.   Per chart review, pt more alert and oriented. Intake has improved, noted meal completion 15-80%. Pt daughter has been assisting with meal ordering. Pt refusing Boost Breeze, but taking Prostat supplements.   Medications reviewed and include synthroid, potassium chloride, and prednisone.   Labs reviewed.   Diet Order:   Diet Order            Diet regular Room service appropriate? Yes; Fluid consistency: Thin  Diet effective now              EDUCATION NEEDS:   No education needs have been identified at this time  Skin:  Skin Assessment: Reviewed RN Assessment  Last BM:   07/16/19 (150 ml output via rectal tube)  Height:   Ht Readings from Last 1 Encounters:  07/03/19 5\' 2"  (1.575 m)    Weight:   Wt Readings from Last 1 Encounters:  07/16/19 80.4 kg    Ideal Body Weight:  50 kg  BMI:  Body mass index is 32.42 kg/m.  Estimated Nutritional Needs:   Kcal:  9326-7124  Protein:  85-100 grams  Fluid:  > 1.7 L    Rhonda Hale A. Jimmye Norman, RD, LDN, [place] Registered Dietitian II Certified Diabetes Care and Education Specialist Pager: [phone] After hours Pager: [phone]

## 2019-07-16 NOTE — Progress Notes (Signed)
   Subjective:  Patient was interviewed at bedside today and is awake and aware. Her only complaints overnight was slight nausea that was alleviated with zofran. She is in good health, and feels like she is continually improving. We discussed her plans for discharge now that her hemoglobin has increased. All questions were answered.   Objective:  Vital signs in last 24 hours: Vitals:   07/16/19 0621 07/16/19 0636 07/16/19 0829 07/16/19 1307  BP: (!) 130/56  (!) 124/59 (!) 112/59  Pulse: 82  80 79  Resp: (!) 23  (!) 25 (!) 31  Temp: 98 F (36.7 C)  97.9 F (36.6 C) 98.1 F (36.7 C)  TempSrc: Oral  Axillary   SpO2: 91%  91% 94%  Weight:  80.4 kg    Height:       Physical Exam Constitutional:      General: She is not in acute distress.    Appearance: Normal appearance. She is not ill-appearing, toxic-appearing or diaphoretic.  Cardiovascular:     Rate and Rhythm: Normal rate and regular rhythm.     Pulses: Normal pulses.     Heart sounds: Normal heart sounds. No murmur. No friction rub. No gallop.   Pulmonary:     Effort: Pulmonary effort is normal. No respiratory distress.     Breath sounds: Normal breath sounds. No stridor. No wheezing, rhonchi or rales.  Chest:     Chest wall: No tenderness.  Abdominal:     General: Abdomen is flat. There is no distension.  Neurological:     Mental Status: She is alert.     Assessment/Plan:  Principal Problem:   PRES (posterior reversible encephalopathy syndrome) Active Problems:   AKI (acute kidney injury) ([place])   Hypercalcemia   Hypomagnesemia, chronic   Multifocal pneumonia  Multifocal Pneumonia/Acute Hypoxic Respiratory Failure:  - afebrile  - Continue prednisone - Change Bactrim from IV to PO tomorrow for discharge pending hemoglobin - Finished Cefepime  - PCP smear negative  PRES Syndrome 2/2 Bevacizumab - SBP Goal 120-140 - Contine Amlodipine and Labetalol   Ischemic ATN:  - Urine output stable  - Avoid  nephrotoxic medication  Anemia of Chronic Disease:  - Hgb 8.5 - CBC with differential tomorrow morning - Continue to monitor Hgb  Prior VTE/PE - Transitioned from heparin to xarelto  - When renal function improves can restart Xarelto on discharge  Dispo: Discharge pending her hemoglobin levels are stable tomorrow.   Maudie Mercury, MD 07/16/2019, 4:57 PM Pager: [phone]

## 2019-07-17 DIAGNOSIS — Z9889 Other specified postprocedural states: Secondary | ICD-10-CM

## 2019-07-17 LAB — CBC WITH DIFFERENTIAL/PLATELET
Abs Immature Granulocytes: 1.67 10*3/uL — ABNORMAL HIGH (ref 0.00–0.07)
Basophils Absolute: 0.1 10*3/uL (ref 0.0–0.1)
Basophils Relative: 1 %
Eosinophils Absolute: 0 10*3/uL (ref 0.0–0.5)
Eosinophils Relative: 0 %
HCT: 27 % — ABNORMAL LOW (ref 36.0–46.0)
Hemoglobin: 8.9 g/dL — ABNORMAL LOW (ref 12.0–15.0)
Immature Granulocytes: 8 %
Lymphocytes Relative: 15 %
Lymphs Abs: 2.9 10*3/uL (ref 0.7–4.0)
MCH: 32.2 pg (ref 26.0–34.0)
MCHC: 33 g/dL (ref 30.0–36.0)
MCV: 97.8 fL (ref 80.0–100.0)
Monocytes Absolute: 1.4 10*3/uL — ABNORMAL HIGH (ref 0.1–1.0)
Monocytes Relative: 7 %
Neutro Abs: 13.8 10*3/uL — ABNORMAL HIGH (ref 1.7–7.7)
Neutrophils Relative %: 69 %
Platelets: 424 10*3/uL — ABNORMAL HIGH (ref 150–400)
RBC: 2.76 MIL/uL — ABNORMAL LOW (ref 3.87–5.11)
RDW: 23.9 % — ABNORMAL HIGH (ref 11.5–15.5)
WBC: 19.9 10*3/uL — ABNORMAL HIGH (ref 4.0–10.5)
nRBC: 5.5 % — ABNORMAL HIGH (ref 0.0–0.2)

## 2019-07-17 MED ORDER — AMLODIPINE BESYLATE 10 MG PO TABS: 10.0000 mg | ORAL_TABLET | Freq: Every day | ORAL | 0 refills | Status: DC

## 2019-07-17 MED ORDER — PREDNISONE 20 MG PO TABS: 20.0000 mg | ORAL_TABLET | Freq: Every day | ORAL | 0 refills | Status: DC

## 2019-07-17 MED ORDER — HEPARIN SOD (PORK) LOCK FLUSH 100 UNIT/ML IV SOLN
500.0000 [IU] | INTRAVENOUS | Status: AC | PRN
  Administered 2019-07-17: 500 [IU]

## 2019-07-17 MED ORDER — PREDNISONE 20 MG PO TABS: 40.0000 mg | ORAL_TABLET | Freq: Every day | ORAL | 0 refills | Status: DC

## 2019-07-17 MED ORDER — LABETALOL HCL 100 MG PO TABS: 50.0000 mg | ORAL_TABLET | Freq: Two times a day (BID) | ORAL | 0 refills | Status: DC

## 2019-07-17 MED ORDER — PREDNISONE 20 MG PO TABS: ORAL_TABLET | ORAL | 0 refills | Status: DC

## 2019-07-17 MED ORDER — SULFAMETHOXAZOLE-TRIMETHOPRIM 800-160 MG PO TABS: 2.0000 | ORAL_TABLET | Freq: Three times a day (TID) | ORAL | 0 refills | Status: DC

## 2019-07-17 MED FILL — predniSONE 20 MG TABS: 20 | 19 days supply | Qty: 27 | Fill #0

## 2019-07-17 MED FILL — LABETALOL HCL 100MG TABLET: 100 | 60 days supply | Qty: 60 | Fill #0

## 2019-07-17 MED FILL — SULFAMETHOXAZOLE-TMP DS TAB: 800-160 | 16 days supply | Qty: 96 | Fill #0

## 2019-07-17 MED FILL — AMLODIPINE BESYLATE 10 MG T: 10 | 30 days supply | Qty: 30 | Fill #0

## 2019-07-17 NOTE — Progress Notes (Signed)
   Subjective: Doing well this AM. She is a little sleepy and had some nausea this AM. It responded well to Zofran. She is anxious to leave the hospital today. We discussed the plan for discharge today. We discussed the results of her PCP smear and he will follow-up with ID in regards to her Bactrim. We will schedule her for follow-up in our clinic. All questions and concerns addressed.   Objective: Vital signs in last 24 hours: Vitals:   07/17/19 0039 07/17/19 0439 07/17/19 0500 07/17/19 0900  BP: (!) 105/51 138/73  (!) 119/57  Pulse: 70 73  79  Resp: (!) 25 (!) 21  (!) 37  Temp: 98.2 F (36.8 C) 98 F (36.7 C)  98.3 F (36.8 C)  TempSrc: Oral     SpO2: 94% 94%  94%  Weight:   80.3 kg   Height:       General: Well nourished female in no acute distress Pulm: Good air movement with no wheezing or crackles  CV: RRR, no murmurs, no rubs   Assessment/Plan:  Principal Problem:   PRES (posterior reversible encephalopathy syndrome) Active Problems:   AKI (acute kidney injury) ([facility])   Hypercalcemia   Hypomagnesemia, chronic   Multifocal pneumonia  Multifocal Pneumonia/Acute Hypoxic Respiratory Failure:  - Afebrile  - Continue prednisone 40 daily for 10 days and then 20mg  daily x 11 days - Continue bactrim on discharge  - Will follow-up with ID on 9/3  - PCP smear negative  PRES Syndrome 2/2 Bevacizumab - SBP Goal 120-140 - Contine Amlodipine and Labetalol   Ischemic ATN:  - Urine output stable  - Avoid nephrotoxic medication  Anemia of Chronic Disease:  - Hgb 8.5  Prior VTE/PE - Continue xarelto  Dispo: Anticipated discharge today.   [name], MD 07/17/2019, 2:22 PM

## 2019-07-17 NOTE — Progress Notes (Signed)
Student Pharmacist rounding with the IMTS-B1 service was consulted on antibiotic duration. The patient is a 73 year old lady on day 56 of hospital admission with suspected PJP pneumonia. She has been managed with IV Bactrim 235.2 mg every 6 hours for treatment and transitioned to Bactrim DS, two tablets by mouth every 8 hours in preparation for discharge. This dose is based off of 15-20mg /kg/day for trimethoprim and divided into 3-4 doses. Today is day 6 of Bactrim therapy. Per [employer] [facility] PJP pneumonia recommendations, the duration of therapy is 21 days. The medicine team has ordered for therapy to continue for 15 more days until August 20th. She is to be seen outpatient on September 3rd to assess clinical improvement and the need for extended prophylaxis dosing.  Youlanda Roys, 4th year Stage manager

## 2019-07-17 NOTE — Progress Notes (Addendum)
Physical Therapy Treatment Patient Details Name: Rhonda Hale MRN: [id number] DOB: 29-Nov-1946 Today's Date: 07/17/2019    History of Present Illness Rhonda Hale is a 73 y.o. woman with PMHx significant for ovarian cancer status post hysterectomy and splenectomy, on chemotherapy, presented to ED with fever and chills, maximum temperature 103F, and nontraumatic fall at home prior to arrival. Associated symptoms include decreased appetite, decreased oral intake, fatigue and weakness for two days.     PT Comments    Pt progressing towards goals. Plan is to go home today, so session focused on clean up following BM and transfer to chair as not to fatigue pt. Pt able to maintain prolonged standing for clean up. Pt requiring min guard A with use of RW to transfer to chair. Current recommendations appropriate. Will continue to follow acutely to maximize functional mobility independence and safety.     Follow Up Recommendations  Home health PT;Supervision/Assistance - 24 hour     Equipment Recommendations  None recommended by PT    Recommendations for Other Services       Precautions / Restrictions Precautions Precautions: Fall Restrictions Weight Bearing Restrictions: No    Mobility  Bed Mobility Overal bed mobility: Needs Assistance Bed Mobility: Supine to Sit     Supine to sit: Min assist;HOB elevated     General bed mobility comments: Min A for trunk elevation. Increased time and effort to come to sitting.   Transfers Overall transfer level: Needs assistance Equipment used: Rolling walker (2 wheeled) Transfers: Sit to/from [facility] Sit to Stand: Min guard Stand pivot transfers: Min guard       General transfer comment: Min guard for safety throughout mobility. Pt had BM in bed and was able to tolerate prolonged standing for clean up following BM.   Ambulation/Gait                 Stairs             Wheelchair Mobility    Modified  Rankin (Stroke Patients Only)       Balance Overall balance assessment: Needs assistance Sitting-balance support: No upper extremity supported;Feet supported Sitting balance-Leahy Scale: Fair     Standing balance support: Bilateral upper extremity supported;During functional activity Standing balance-Leahy Scale: Poor Standing balance comment: Reliant on BUE support for stability.                             Cognition Arousal/Alertness: Awake/alert Behavior During Therapy: WFL for tasks assessed/performed Overall Cognitive Status: Within Functional Limits for tasks assessed                                        Exercises      General Comments        Pertinent Vitals/Pain Pain Assessment: No/denies pain    Home Living                      Prior Function            PT Goals (current goals can now be found in the care plan section) Acute Rehab PT Goals Patient Stated Goal: to go home today PT Goal Formulation: With patient/family Time For Goal Achievement: 07/18/19 Potential to Achieve Goals: Good Progress towards PT goals: Progressing toward goals    Frequency    Min 3X/week  PT Plan Current plan remains appropriate    Co-evaluation              AM-PAC PT "6 Clicks" Mobility   Outcome Measure  Help needed turning from your back to your side while in a flat bed without using bedrails?: A Little Help needed moving from lying on your back to sitting on the side of a flat bed without using bedrails?: A Little Help needed moving to and from a bed to a chair (including a wheelchair)?: A Little Help needed standing up from a chair using your arms (e.g., wheelchair or bedside chair)?: A Little Help needed to walk in hospital room?: A Lot Help needed climbing 3-5 steps with a railing? : A Lot 6 Click Score: 16    End of Session   Activity Tolerance: Patient tolerated treatment well Patient left: in chair;with  call bell/phone within reach;with family/visitor present;with nursing/sitter in room Nurse Communication: Mobility status PT Visit Diagnosis: Unsteadiness on feet (R26.81)     Time: 5188-4166 PT Time Calculation (min) (ACUTE ONLY): 24 min  Charges:  $Therapeutic Activity: 23-37 mins                     Leighton Ruff, PT, DPT  Acute Rehabilitation Services  Pager: [phone] Office: [phone]    [name] 07/17/2019, 2:07 PM

## 2019-07-17 NOTE — Progress Notes (Signed)
1455: Patient discharged. Discharge instructions provided to patient and daughter Daleen Snook.

## 2019-07-17 NOTE — Plan of Care (Signed)
  Problem: Education: Goal: Knowledge of General Education information will improve Description: Including pain rating scale, medication(s)/side effects and non-pharmacologic comfort measures Outcome: Progressing   Problem: Health Behavior/Discharge Planning: Goal: Ability to manage health-related needs will improve Outcome: Progressing   Problem: Clinical Measurements: Goal: Ability to maintain clinical measurements within normal limits will improve Outcome: Progressing Goal: Will remain free from infection Outcome: Progressing   Problem: Activity: Goal: Risk for activity intolerance will decrease Outcome: Progressing   Problem: Pain Managment: Goal: General experience of comfort will improve Outcome: Progressing   Problem: Safety: Goal: Ability to remain free from injury will improve Outcome: Progressing

## 2019-07-17 NOTE — Discharge Instructions (Signed)
Fever, Adult     A fever is an increase in the body's temperature. It is usually defined as a temperature of 100.33F (38C) or higher. Brief mild or moderate fevers generally have no long-term effects, and they often do not need treatment. Moderate or high fevers may make you feel uncomfortable and can sometimes be a sign of a serious illness or disease. The sweating that may occur with repeated or prolonged fever may also cause a loss of fluid in the body (dehydration). Fever is confirmed by taking a temperature with a thermometer. A measured temperature can vary with:  Age.  Time of day.  Where in the body you take the temperature. Readings may vary if you place the thermometer: ? In the mouth (oral). ? In the rectum (rectal). ? In the ear (tympanic). ? Under the arm (axillary). ? On the forehead (temporal). Follow these instructions at home: Medicines  Take over-the counter and prescription medicines only as told by your health care provider. Follow the dosing instructions carefully.  If you were prescribed an antibiotic medicine, take it as told by your health care provider. Do not stop taking the antibiotic even if you start to feel better. General instructions  Watch your condition for any changes. Let your health care provider know about them.  Rest as needed.  Drink enough fluid to keep your urine pale yellow. This helps to prevent dehydration.  Sponge yourself or bathe with room-temperature water to help reduce your body temperature as needed. Do not use ice water.  Do not use too many blankets or wear clothes that are too heavy.  If your fever may be caused by an infection that spreads from person to person (is contagious), such as a cold or the flu, you should stay home from work and public gatherings for at least 24 hours after your fever is gone. Your fever should be gone without the need to use medicines. Contact a health care provider if:  You vomit.  You cannot  eat or drink without vomiting.  You have diarrhea.  You have pain when you urinate.  Your symptoms do not improve with treatment.  You develop new symptoms.  You develop excessive weakness. Get help right away if:  You have shortness of breath or have trouble breathing.  You are dizzy or you faint.  You are disoriented or confused.  You develop signs of dehydration, such as: ? Dark urine, very little urine, or no urine. ? Cracked lips. ? Dry mouth. ? Sunken eyes. ? Sleepiness. ? Weakness.  You develop severe pain in your abdomen.  You have persistent vomiting or diarrhea.  You develop a skin rash.  Your symptoms suddenly get worse. Summary  A fever is an increase in the body's temperature. It is usually defined as a temperature of 100.33F (38C) or higher. Moderate or high fevers can sometimes be a sign of a serious illness or disease. The sweating that may occur with repeated or prolonged fever may also cause dehydration.  Pay attention to any changes in your symptoms and contact your health care provider if your symptoms do not improve with treatment.  Take over-the counter and prescription medicines only as told by your health care provider. Follow the dosing instructions carefully.  If your fever is from an infection that may be contagious, such as cold or flu, you should stay home from work and public gatherings for at least 24 hours after your fever is gone. Your fever should be gone  without the need to use medicines.  Get help right away if you develop signs of dehydration, such as dark urine, cracked lips, dry mouth, sunken eyes, sleepiness, or weakness. This information is not intended to replace advice given to you by your health care provider. Make sure you discuss any questions you have with your health care provider. Document Released: 05/24/2001 Document Revised: 05/14/2018 Document Reviewed: 05/14/2018 Elsevier Patient Education  2020 [employer].

## 2019-07-22 ENCOUNTER — Telehealth: Payer: Self-pay | Admitting: *Deleted

## 2019-07-22 MED FILL — MEGESTROL 20 MG TABLET: 20 | 30 days supply | Qty: 120 | Fill #0

## 2019-07-22 NOTE — Telephone Encounter (Signed)
[facility] [place] calls for VO 3x week for 1 week 2x week for 7 weeks 1x week for 1 week Strengthening, balance, safety VO given, do you agree?

## 2019-07-22 NOTE — Telephone Encounter (Signed)
yes

## 2019-07-24 ENCOUNTER — Telehealth: Payer: Self-pay

## 2019-07-24 ENCOUNTER — Ambulatory Visit (INDEPENDENT_AMBULATORY_CARE_PROVIDER_SITE_OTHER): Payer: Medicare Other | Admitting: Internal Medicine

## 2019-07-24 ENCOUNTER — Other Ambulatory Visit: Payer: Self-pay

## 2019-07-24 VITALS — BP 122/74 | HR 74 | Temp 98.0°F | Ht 62.0 in | Wt 160.8 lb

## 2019-07-24 DIAGNOSIS — Z79899 Other long term (current) drug therapy: Secondary | ICD-10-CM

## 2019-07-24 DIAGNOSIS — E875 Hyperkalemia: Secondary | ICD-10-CM

## 2019-07-24 DIAGNOSIS — C569 Malignant neoplasm of unspecified ovary: Secondary | ICD-10-CM | POA: Diagnosis not present

## 2019-07-24 DIAGNOSIS — R159 Full incontinence of feces: Secondary | ICD-10-CM | POA: Diagnosis not present

## 2019-07-24 DIAGNOSIS — R32 Unspecified urinary incontinence: Secondary | ICD-10-CM

## 2019-07-24 DIAGNOSIS — I6783 Posterior reversible encephalopathy syndrome: Secondary | ICD-10-CM | POA: Diagnosis not present

## 2019-07-24 NOTE — Telephone Encounter (Signed)
Jim with [facility] requesting VO for OT. Please call back.

## 2019-07-24 NOTE — Patient Instructions (Signed)
Thank you, Ms.Delice Bison for allowing [place] to provide your care today. Today we discussed hospital follow up.    I have ordered CBC and CMP labs for you. I will call if any are abnormal.    I have place a referrals to Neurology for PRES Syndrome follow up   I have ordered the following tests: none today   We changes the following medications: none today   Please follow-up in 2 weeks.    Should you have any questions or concerns please call the internal medicine clinic at [phone].    Marianna Payment, D.O. [facility]

## 2019-07-25 LAB — CBC
Hematocrit: 31.4 % — ABNORMAL LOW (ref 34.0–46.6)
Hemoglobin: 10.1 g/dL — ABNORMAL LOW (ref 11.1–15.9)
MCH: 31.8 pg (ref 26.6–33.0)
MCHC: 32.2 g/dL (ref 31.5–35.7)
MCV: 99 fL — ABNORMAL HIGH (ref 79–97)
NRBC: 1 % — ABNORMAL HIGH (ref 0–0)
Platelets: 300 10*3/uL (ref 150–450)
RBC: 3.18 x10E6/uL — ABNORMAL LOW (ref 3.77–5.28)
RDW: 19.8 % — ABNORMAL HIGH (ref 11.7–15.4)
WBC: 16.4 10*3/uL — ABNORMAL HIGH (ref 3.4–10.8)

## 2019-07-25 LAB — CMP14 + ANION GAP
ALT: 34 IU/L — ABNORMAL HIGH (ref 0–32)
AST: 34 IU/L (ref 0–40)
Albumin/Globulin Ratio: 2 (ref 1.2–2.2)
Albumin: 4.1 g/dL (ref 3.7–4.7)
Alkaline Phosphatase: 68 IU/L (ref 39–117)
Anion Gap: 20 mmol/L — ABNORMAL HIGH (ref 10.0–18.0)
BUN/Creatinine Ratio: 18 (ref 12–28)
BUN: 25 mg/dL (ref 8–27)
Bilirubin Total: <0.2 mg/dL (ref 0.0–1.2)
CO2: 18 mmol/L — ABNORMAL LOW (ref 20–29)
Calcium: 10.4 mg/dL — ABNORMAL HIGH (ref 8.7–10.3)
Chloride: 93 mmol/L — ABNORMAL LOW (ref 96–106)
Creatinine, Ser: 1.39 mg/dL — ABNORMAL HIGH (ref 0.57–1.00)
GFR calc Af Amer: 43 mL/min/{1.73_m2} — ABNORMAL LOW (ref >59)
GFR calc non Af Amer: 37 mL/min/{1.73_m2} — ABNORMAL LOW (ref >59)
Globulin, Total: 2.1 g/dL (ref 1.5–4.5)
Glucose: 106 mg/dL — ABNORMAL HIGH (ref 65–99)
Potassium: 5.9 mmol/L — ABNORMAL HIGH (ref 3.5–5.2)
Sodium: 131 mmol/L — ABNORMAL LOW (ref 134–144)
Total Protein: 6.2 g/dL (ref 6.0–8.5)

## 2019-07-25 NOTE — Telephone Encounter (Signed)
LM for rtc

## 2019-07-26 ENCOUNTER — Other Ambulatory Visit: Payer: Self-pay

## 2019-07-26 ENCOUNTER — Encounter (HOSPITAL_COMMUNITY): Payer: Self-pay | Admitting: Emergency Medicine

## 2019-07-26 ENCOUNTER — Other Ambulatory Visit (HOSPITAL_COMMUNITY): Payer: Self-pay

## 2019-07-26 ENCOUNTER — Inpatient Hospital Stay (HOSPITAL_COMMUNITY)
Admission: EM | Admit: 2019-07-26 | Discharge: 2019-08-02 | DRG: 091 | Disposition: A | Discharge status: Home / Self Care | Payer: Medicare Other | Attending: Internal Medicine | Admitting: Internal Medicine

## 2019-07-26 ENCOUNTER — Telehealth: Payer: Self-pay | Admitting: Internal Medicine

## 2019-07-26 ENCOUNTER — Emergency Department (HOSPITAL_COMMUNITY): Payer: Medicare Other

## 2019-07-26 DIAGNOSIS — Z79899 Other long term (current) drug therapy: Secondary | ICD-10-CM

## 2019-07-26 DIAGNOSIS — R410 Disorientation, unspecified: Secondary | ICD-10-CM

## 2019-07-26 DIAGNOSIS — R630 Anorexia: Secondary | ICD-10-CM | POA: Diagnosis present

## 2019-07-26 DIAGNOSIS — E871 Hypo-osmolality and hyponatremia: Secondary | ICD-10-CM | POA: Diagnosis present

## 2019-07-26 DIAGNOSIS — I129 Hypertensive chronic kidney disease with stage 1 through stage 4 chronic kidney disease, or unspecified chronic kidney disease: Secondary | ICD-10-CM | POA: Diagnosis present

## 2019-07-26 DIAGNOSIS — Z79890 Hormone replacement therapy (postmenopausal): Secondary | ICD-10-CM

## 2019-07-26 DIAGNOSIS — Z90710 Acquired absence of both cervix and uterus: Secondary | ICD-10-CM

## 2019-07-26 DIAGNOSIS — E860 Dehydration: Secondary | ICD-10-CM | POA: Diagnosis present

## 2019-07-26 DIAGNOSIS — N170 Acute kidney failure with tubular necrosis: Secondary | ICD-10-CM | POA: Diagnosis present

## 2019-07-26 DIAGNOSIS — E875 Hyperkalemia: Secondary | ICD-10-CM | POA: Insufficient documentation

## 2019-07-26 DIAGNOSIS — G255 Other chorea: Secondary | ICD-10-CM | POA: Diagnosis present

## 2019-07-26 DIAGNOSIS — Z888 Allergy status to other drugs, medicaments and biological substances status: Secondary | ICD-10-CM

## 2019-07-26 DIAGNOSIS — R739 Hyperglycemia, unspecified: Secondary | ICD-10-CM | POA: Diagnosis not present

## 2019-07-26 DIAGNOSIS — R32 Unspecified urinary incontinence: Secondary | ICD-10-CM | POA: Diagnosis present

## 2019-07-26 DIAGNOSIS — Z20828 Contact with and (suspected) exposure to other viral communicable diseases: Secondary | ICD-10-CM | POA: Diagnosis present

## 2019-07-26 DIAGNOSIS — L658 Other specified nonscarring hair loss: Secondary | ICD-10-CM | POA: Diagnosis present

## 2019-07-26 DIAGNOSIS — T451X5A Adverse effect of antineoplastic and immunosuppressive drugs, initial encounter: Secondary | ICD-10-CM | POA: Diagnosis present

## 2019-07-26 DIAGNOSIS — Z8543 Personal history of malignant neoplasm of ovary: Secondary | ICD-10-CM

## 2019-07-26 DIAGNOSIS — R278 Other lack of coordination: Secondary | ICD-10-CM | POA: Diagnosis present

## 2019-07-26 DIAGNOSIS — Z7901 Long term (current) use of anticoagulants: Secondary | ICD-10-CM

## 2019-07-26 DIAGNOSIS — G92 Toxic encephalopathy: Principal | ICD-10-CM | POA: Diagnosis present

## 2019-07-26 DIAGNOSIS — E872 Acidosis: Secondary | ICD-10-CM | POA: Diagnosis not present

## 2019-07-26 DIAGNOSIS — N183 Chronic kidney disease, stage 3 (moderate): Secondary | ICD-10-CM | POA: Diagnosis present

## 2019-07-26 DIAGNOSIS — R4182 Altered mental status, unspecified: Secondary | ICD-10-CM | POA: Diagnosis present

## 2019-07-26 DIAGNOSIS — R197 Diarrhea, unspecified: Secondary | ICD-10-CM | POA: Diagnosis present

## 2019-07-26 DIAGNOSIS — F22 Delusional disorders: Secondary | ICD-10-CM | POA: Diagnosis present

## 2019-07-26 DIAGNOSIS — Z86711 Personal history of pulmonary embolism: Secondary | ICD-10-CM

## 2019-07-26 DIAGNOSIS — D539 Nutritional anemia, unspecified: Secondary | ICD-10-CM | POA: Diagnosis present

## 2019-07-26 DIAGNOSIS — R946 Abnormal results of thyroid function studies: Secondary | ICD-10-CM | POA: Diagnosis not present

## 2019-07-26 DIAGNOSIS — G9340 Encephalopathy, unspecified: Secondary | ICD-10-CM

## 2019-07-26 DIAGNOSIS — Z9221 Personal history of antineoplastic chemotherapy: Secondary | ICD-10-CM

## 2019-07-26 DIAGNOSIS — E039 Hypothyroidism, unspecified: Secondary | ICD-10-CM | POA: Diagnosis present

## 2019-07-26 DIAGNOSIS — T380X5A Adverse effect of glucocorticoids and synthetic analogues, initial encounter: Secondary | ICD-10-CM | POA: Diagnosis present

## 2019-07-26 DIAGNOSIS — N179 Acute kidney failure, unspecified: Secondary | ICD-10-CM | POA: Diagnosis present

## 2019-07-26 LAB — COMPREHENSIVE METABOLIC PANEL
ALT: 38 U/L (ref 0–44)
AST: 30 U/L (ref 15–41)
Albumin: 3.5 g/dL (ref 3.5–5.0)
Alkaline Phosphatase: 56 U/L (ref 38–126)
Anion gap: 14 (ref 5–15)
BUN: 36 mg/dL — ABNORMAL HIGH (ref 8–23)
CO2: 17 mmol/L — ABNORMAL LOW (ref 22–32)
Calcium: 10.7 mg/dL — ABNORMAL HIGH (ref 8.9–10.3)
Chloride: 96 mmol/L — ABNORMAL LOW (ref 98–111)
Creatinine, Ser: 1.8 mg/dL — ABNORMAL HIGH (ref 0.44–1.00)
GFR calc Af Amer: 31 mL/min — ABNORMAL LOW (ref >60)
GFR calc non Af Amer: 27 mL/min — ABNORMAL LOW (ref >60)
Glucose, Bld: 111 mg/dL — ABNORMAL HIGH (ref 70–99)
Potassium: 5.1 mmol/L (ref 3.5–5.1)
Sodium: 127 mmol/L — ABNORMAL LOW (ref 135–145)
Total Bilirubin: 0.3 mg/dL (ref 0.3–1.2)
Total Protein: 6.3 g/dL — ABNORMAL LOW (ref 6.5–8.1)

## 2019-07-26 LAB — AMMONIA: Ammonia: 18 umol/L (ref 9–35)

## 2019-07-26 LAB — C DIFFICILE QUICK SCREEN W PCR REFLEX
C Diff antigen: NEGATIVE
C Diff interpretation: NOT DETECTED
C Diff toxin: NEGATIVE

## 2019-07-26 LAB — SARS CORONAVIRUS 2 BY RT PCR (HOSPITAL ORDER, PERFORMED IN ~~LOC~~ HOSPITAL LAB): SARS Coronavirus 2: NEGATIVE

## 2019-07-26 LAB — CBC WITH DIFFERENTIAL/PLATELET
Abs Immature Granulocytes: 0.74 10*3/uL — ABNORMAL HIGH (ref 0.00–0.07)
Basophils Absolute: 0 10*3/uL (ref 0.0–0.1)
Basophils Relative: 0 %
Eosinophils Absolute: 0 10*3/uL (ref 0.0–0.5)
Eosinophils Relative: 0 %
HCT: 26.8 % — ABNORMAL LOW (ref 36.0–46.0)
Hemoglobin: 9.1 g/dL — ABNORMAL LOW (ref 12.0–15.0)
Immature Granulocytes: 5 %
Lymphocytes Relative: 11 %
Lymphs Abs: 1.6 10*3/uL (ref 0.7–4.0)
MCH: 32.4 pg (ref 26.0–34.0)
MCHC: 34 g/dL (ref 30.0–36.0)
MCV: 95.4 fL (ref 80.0–100.0)
Monocytes Absolute: 1.2 10*3/uL — ABNORMAL HIGH (ref 0.1–1.0)
Monocytes Relative: 8 %
Neutro Abs: 11.1 10*3/uL — ABNORMAL HIGH (ref 1.7–7.7)
Neutrophils Relative %: 76 %
Platelets: 253 10*3/uL (ref 150–400)
RBC: 2.81 MIL/uL — ABNORMAL LOW (ref 3.87–5.11)
RDW: 20.9 % — ABNORMAL HIGH (ref 11.5–15.5)
WBC: 14.6 10*3/uL — ABNORMAL HIGH (ref 4.0–10.5)
nRBC: 0.6 % — ABNORMAL HIGH (ref 0.0–0.2)

## 2019-07-26 MED ORDER — ONDANSETRON HCL 4 MG/2ML IJ SOLN: 4.0000 mg | Freq: Four times a day (QID) | INTRAMUSCULAR | Status: DC | PRN

## 2019-07-26 MED ORDER — ESCITALOPRAM OXALATE 20 MG PO TABS
20.0000 mg | ORAL_TABLET | Freq: Every day | ORAL | Status: DC
  Administered 2019-07-27 – 2019-07-31 (×5): 20 mg via ORAL
  Filled 2019-07-26 (×5): qty 1

## 2019-07-26 MED ORDER — ACETAMINOPHEN 325 MG PO TABS
650.0000 mg | ORAL_TABLET | Freq: Four times a day (QID) | ORAL | Status: DC | PRN
  Administered 2019-07-28: 650 mg via ORAL
  Filled 2019-07-26: qty 2

## 2019-07-26 MED ORDER — ENOXAPARIN SODIUM 30 MG/0.3ML ~~LOC~~ SOLN: 30.0000 mg | SUBCUTANEOUS | Status: DC

## 2019-07-26 MED ORDER — CALCITRIOL 0.5 MCG PO CAPS
0.5000 ug | ORAL_CAPSULE | Freq: Every day | ORAL | Status: DC
  Administered 2019-07-27 – 2019-08-02 (×7): 0.5 ug via ORAL
  Filled 2019-07-26 (×7): qty 1

## 2019-07-26 MED ORDER — AMLODIPINE BESYLATE 10 MG PO TABS
10.0000 mg | ORAL_TABLET | Freq: Every day | ORAL | Status: DC
  Administered 2019-07-27 – 2019-08-02 (×7): 10 mg via ORAL
  Filled 2019-07-26 (×7): qty 1

## 2019-07-26 MED ORDER — LACTATED RINGERS IV SOLN
INTRAVENOUS | Status: AC
  Administered 2019-07-26 – 2019-07-27 (×2): via INTRAVENOUS

## 2019-07-26 MED ORDER — SODIUM CHLORIDE 0.9 % IV BOLUS
500.0000 mL | Freq: Once | INTRAVENOUS | Status: AC
  Administered 2019-07-26: 500 mL via INTRAVENOUS

## 2019-07-26 MED ORDER — LABETALOL HCL 100 MG PO TABS
50.0000 mg | ORAL_TABLET | Freq: Two times a day (BID) | ORAL | Status: DC
  Administered 2019-07-27 – 2019-08-02 (×14): 50 mg via ORAL
  Filled 2019-07-26 (×14): qty 1

## 2019-07-26 MED ORDER — SULFAMETHOXAZOLE-TRIMETHOPRIM 800-160 MG PO TABS
2.0000 | ORAL_TABLET | Freq: Three times a day (TID) | ORAL | Status: DC
  Administered 2019-07-27 – 2019-07-31 (×14): 2 via ORAL
  Filled 2019-07-26 (×14): qty 2

## 2019-07-26 MED ORDER — LEVOTHYROXINE SODIUM 25 MCG PO TABS
137.0000 ug | ORAL_TABLET | Freq: Every day | ORAL | Status: DC
  Administered 2019-07-27 – 2019-08-01 (×6): 137 ug via ORAL
  Filled 2019-07-26 (×6): qty 1

## 2019-07-26 MED ORDER — MEGESTROL ACETATE 40 MG PO TABS
40.0000 mg | ORAL_TABLET | Freq: Two times a day (BID) | ORAL | Status: DC
  Administered 2019-07-27 – 2019-08-02 (×14): 40 mg via ORAL
  Filled 2019-07-26 (×16): qty 1

## 2019-07-26 MED ORDER — ACETAMINOPHEN 650 MG RE SUPP: 650.0000 mg | Freq: Four times a day (QID) | RECTAL | Status: DC | PRN

## 2019-07-26 MED ORDER — SODIUM CHLORIDE 0.9% FLUSH
10.0000 mL | INTRAVENOUS | Status: DC | PRN
  Administered 2019-07-27: 10 mL
  Filled 2019-07-26: qty 40

## 2019-07-26 MED ORDER — ONDANSETRON HCL 4 MG PO TABS: 4.0000 mg | ORAL_TABLET | Freq: Four times a day (QID) | ORAL | Status: DC | PRN

## 2019-07-26 MED ORDER — TIOTROPIUM BROMIDE MONOHYDRATE 1.25 MCG/ACT IN AERS: 1.0000 | INHALATION_SPRAY | Freq: Every day | RESPIRATORY_TRACT | Status: DC | PRN

## 2019-07-26 MED ORDER — RIVAROXABAN 20 MG PO TABS
20.0000 mg | ORAL_TABLET | Freq: Every day | ORAL | Status: DC
  Administered 2019-07-27 – 2019-07-29 (×4): 20 mg via ORAL
  Filled 2019-07-26 (×5): qty 1

## 2019-07-26 MED ORDER — UMECLIDINIUM BROMIDE 62.5 MCG/INH IN AEPB
1.0000 | INHALATION_SPRAY | Freq: Every day | RESPIRATORY_TRACT | Status: DC
  Administered 2019-07-27 – 2019-08-02 (×7): 1 via RESPIRATORY_TRACT
  Filled 2019-07-26: qty 7

## 2019-07-26 NOTE — Progress Notes (Signed)
Internal Medicine Clinic Attending  I saw and evaluated the patient.  I personally confirmed the key portions of the history and exam documented by Dr. Marianna Payment and I reviewed pertinent patient test results.  The assessment, diagnosis, and plan were formulated together and I agree with the documentation in the resident's note.      Patient noted to have an elevated potassium of 5.9 (possibly secondary to bactrim use). She was instructed to follow up in the ED by Dr. Marianna Payment.

## 2019-07-26 NOTE — Progress Notes (Signed)
   CC: PRES Syndrome Follow up  HPI:  [name] is a 73 y.o. female with a past medical history stated bellow and presents today for hospital follow-up for PRES syndrome. Please see problem based assessment and plan for additional details.   Past Medical History:  Diagnosis Date  . Cancer [facility])      Review of Systems: Review of Systems  Constitutional: Negative for chills and fever.  Eyes: Negative for blurred vision and double vision.  Respiratory: Negative for shortness of breath.   Cardiovascular: Negative for chest pain and leg swelling.  Neurological: Positive for focal weakness and weakness.  Psychiatric/Behavioral: Positive for hallucinations and memory loss.     Vitals:   07/24/19 1029  BP: 122/74  Pulse: 74  Temp: 98 F (36.7 C)  TempSrc: Oral  SpO2: 98%  Weight: 160 lb 12.8 oz (72.9 kg)  Height: 5\' 2"  (1.575 m)     Physical Exam: Physical Exam  Constitutional: She is oriented to person, place, and time. No distress.  Cardiovascular: Normal rate, regular rhythm, normal heart sounds and intact distal pulses. Exam reveals no gallop and no friction rub.  No murmur heard. Pulmonary/Chest: Effort normal. She has no wheezes. She has rales. She exhibits tenderness.  Abdominal: Soft. There is no abdominal tenderness.  Musculoskeletal:        General: No edema.  Neurological: She is alert and oriented to person, place, and time. She displays weakness. Coordination abnormal.     Assessment & Plan:   See Encounters Tab for problem based charting.  Patient seen with Dr. Dareen Piano

## 2019-07-26 NOTE — ED Notes (Signed)
ED TO INPATIENT HANDOFF REPORT  ED Nurse Name and Phone #: Caprice Kluver [phone]  S Name/Age/Gender Rhonda Hale 73 y.o. female Room/Bed: 037C/037C  Code Status   Code Status: Partial Code  Home/SNF/Other Home Patient oriented to: self and situation Is this baseline? No   Triage Complete: Triage complete  Chief Complaint Potassium 5.9  Triage Note As recently admitted to the hospital and diagnosed with PRES, pt also had pneumonia fevers and confusion. Pt has since went home where she lives with daughter- pt was called today by IM resident and told to come back to ER due to K level of 5.9 along with worsening of confusion per daughter.      Allergies Allergies  Allergen Reactions  . Atorvastatin Other (See Comments)    Severe heartburn, reflux    Level of Care/Admitting Diagnosis ED Disposition    ED Disposition Condition [facility] Area: [place] [[id number]]  Level of Care: Telemetry Medical [104]  Covid Evaluation: Confirmed COVID Negative  Diagnosis: Dehydration [276.51.ICD-9-CM]  Admitting Physician: Sid Falcon [phone]  Attending Physician: Sid Falcon [phone]  PT Class (Do Not Modify): Observation [104]  PT Acc Code (Do Not Modify): Observation [10022]       B Medical/Surgery History Past Medical History:  Diagnosis Date  . Cancer [facility])    History reviewed. No pertinent surgical history.   A IV Location/Drains/Wounds Patient Lines/Drains/Airways Status   Active Line/Drains/Airways    Name:   Placement date:   Placement time:   Site:   Days:   Implanted Port 07/03/19 Right Chest   07/03/19    1028    Chest   23          Intake/Output Last 24 hours No intake or output data in the 24 hours ending 07/26/19 2209  Labs/Imaging Results for orders placed or performed during the hospital encounter of 07/26/19 (from the past 48 hour(s))  Comprehensive metabolic panel     Status: Abnormal   Collection Time: 07/26/19   1:46 PM  Result Value Ref Range   Sodium 127 (L) 135 - 145 mmol/L   Potassium 5.1 3.5 - 5.1 mmol/L   Chloride 96 (L) 98 - 111 mmol/L   CO2 17 (L) 22 - 32 mmol/L   Glucose, Bld 111 (H) 70 - 99 mg/dL   BUN 36 (H) 8 - 23 mg/dL   Creatinine, Ser 1.80 (H) 0.44 - 1.00 mg/dL   Calcium 10.7 (H) 8.9 - 10.3 mg/dL   Total Protein 6.3 (L) 6.5 - 8.1 g/dL   Albumin 3.5 3.5 - 5.0 g/dL   AST 30 15 - 41 U/L   ALT 38 0 - 44 U/L   Alkaline Phosphatase 56 38 - 126 U/L   Total Bilirubin 0.3 0.3 - 1.2 mg/dL   GFR calc non Af Amer 27 (L) >60 mL/min   GFR calc Af Amer 31 (L) >60 mL/min   Anion gap 14 5 - 15    Comment: Performed at [facility], [address], [place], [place] [address]  CBC with Differential     Status: Abnormal   Collection Time: 07/26/19  1:46 PM  Result Value Ref Range   WBC 14.6 (H) 4.0 - 10.5 K/uL   RBC 2.81 (L) 3.87 - 5.11 MIL/uL   Hemoglobin 9.1 (L) 12.0 - 15.0 g/dL   HCT 26.8 (L) 36.0 - 46.0 %   MCV 95.4 80.0 - 100.0 fL   MCH 32.4  26.0 - 34.0 pg   MCHC 34.0 30.0 - 36.0 g/dL   RDW 20.9 (H) 11.5 - 15.5 %   Platelets 253 150 - 400 K/uL   nRBC 0.6 (H) 0.0 - 0.2 %   Neutrophils Relative % 76 %   Neutro Abs 11.1 (H) 1.7 - 7.7 K/uL   Lymphocytes Relative 11 %   Lymphs Abs 1.6 0.7 - 4.0 K/uL   Monocytes Relative 8 %   Monocytes Absolute 1.2 (H) 0.1 - 1.0 K/uL   Eosinophils Relative 0 %   Eosinophils Absolute 0.0 0.0 - 0.5 K/uL   Basophils Relative 0 %   Basophils Absolute 0.0 0.0 - 0.1 K/uL   Immature Granulocytes 5 %   Abs Immature Granulocytes 0.74 (H) 0.00 - 0.07 K/uL    Comment: Performed at [facility] [address] [place], [place] [address]  SARS Coronavirus 2 [facility] order, Performed in [facility] hospital lab)     Status: None   Collection Time: 07/26/19  7:44 PM  Result Value Ref Range   SARS Coronavirus 2 NEGATIVE NEGATIVE    Comment: (NOTE) If result is NEGATIVE SARS-CoV-2 target nucleic acids are NOT DETECTED. The SARS-CoV-2 RNA is  generally detectable in upper and lower  respiratory specimens during the acute phase of infection. The lowest  concentration of SARS-CoV-2 viral copies this assay can detect is 250  copies / mL. A negative result does not preclude SARS-CoV-2 infection  and should not be used as the sole basis for treatment or other  patient management decisions.  A negative result may occur with  improper specimen collection / handling, submission of specimen other  than nasopharyngeal swab, presence of viral mutation(s) within the  areas targeted by this assay, and inadequate number of viral copies  (<250 copies / mL). A negative result must be combined with clinical  observations, patient history, and epidemiological information. If result is POSITIVE SARS-CoV-2 target nucleic acids are DETECTED. The SARS-CoV-2 RNA is generally detectable in upper and lower  respiratory specimens dur ing the acute phase of infection.  Positive  results are indicative of active infection with SARS-CoV-2.  Clinical  correlation with patient history and other diagnostic information is  necessary to determine patient infection status.  Positive results do  not rule out bacterial infection or co-infection with other viruses. If result is PRESUMPTIVE POSTIVE SARS-CoV-2 nucleic acids MAY BE PRESENT.   A presumptive positive result was obtained on the submitted specimen  and confirmed on repeat testing.  While 2019 novel coronavirus  (SARS-CoV-2) nucleic acids may be present in the submitted sample  additional confirmatory testing may be necessary for epidemiological  and / or clinical management purposes  to differentiate between  SARS-CoV-2 and other Sarbecovirus currently known to infect humans.  If clinically indicated additional testing with an alternate test  methodology [phone]) is advised. The SARS-CoV-2 RNA is generally  detectable in upper and lower respiratory sp ecimens during the acute  phase of  infection. The expected result is Negative. Fact Sheet for Patients:  StrictlyIdeas.no Fact Sheet for Healthcare Providers: BankingDealers.co.za This test is not yet approved or cleared by the [place] FDA and has been authorized for detection and/or diagnosis of SARS-CoV-2 by FDA under an Emergency Use Authorization (EUA).  This EUA will remain in effect (meaning this test can be used) for the duration of the COVID-19 declaration under Section 564(b)(1) of the Act, 21 U.S.C. section 360bbb-3(b)(1), unless the authorization is terminated or revoked sooner.  Performed at [facility], [place] [address], [place], [place] [address]   Ammonia     Status: None   Collection Time: 07/26/19  7:55 PM  Result Value Ref Range   Ammonia 18 9 - 35 umol/L    Comment: Performed at [facility], [place] [address] [place],  [address]   Dg Chest Portable 1 View  Result Date: 07/26/2019 CLINICAL DATA:  73 year old female with recent pneumonia and hospital admission 1 week ago. EXAM: PORTABLE CHEST 1 VIEW COMPARISON:  Chest radiograph dated 07/10/2019 and CT dated 07/11/2019 FINDINGS: Left lung base hazy density likely combination of a small pleural effusion and associated left lung base atelectasis or infiltrate. Overall there has been interval improvement of the aeration of the lungs and clearance of the hazy densities in the left upper lobe and right lung base. There is no pneumothorax. The cardiac silhouette is within normal limits. Right pectoral Port-A-Cath with tip at the cavoatrial junction. No acute osseous pathology. Left upper lobe surgical sutures noted. IMPRESSION: Small left pleural effusion and left lung base atelectasis or infiltrate. Overall interval improvement of the aeration of the lungs compared to the prior radiograph. Electronically Signed   By: Anner Crete M.D.   On: 07/26/2019 19:31    Pending Labs Unresulted Labs  (From admission, onward)    Start     Ordered   07/27/19 0500  Comprehensive metabolic panel  Tomorrow morning,   R     07/26/19 1833   07/27/19 0500  CBC  Tomorrow morning,   R     07/26/19 1833   07/26/19 1906  Gastrointestinal Panel by PCR , Stool  (Gastrointestinal Panel by PCR, Stool)  Once,   STAT     07/26/19 1905   07/26/19 1906  Culture, blood (routine x 2)  BLOOD CULTURE X 2,   R (with STAT occurrences)     07/26/19 1905   07/26/19 1905  C difficile quick scan w PCR reflex  (C Difficile quick screen w PCR reflex panel)  Once, for 24 hours,   STAT     07/26/19 1905   07/26/19 1749  Urine culture  ONCE - STAT,   STAT     07/26/19 1753   07/26/19 1748  Urinalysis, Routine w reflex microscopic  Once,   STAT     07/26/19 1753          Vitals/Pain Today's Vitals   07/26/19 1830 07/26/19 1845 07/26/19 1937 07/26/19 2015  BP: 124/63 114/67 (!) 124/98 103/65  Pulse:   84 83  Resp: 20 (!) 23 20 (!) 28  Temp:      TempSrc:      SpO2:   100% 97%  PainSc:        Isolation Precautions Enteric precautions (UV disinfection)  Medications Medications  acetaminophen (TYLENOL) tablet 650 mg (has no administration in time range)    Or  acetaminophen (TYLENOL) suppository 650 mg (has no administration in time range)  ondansetron (ZOFRAN) tablet 4 mg (has no administration in time range)    Or  ondansetron (ZOFRAN) injection 4 mg (has no administration in time range)  sodium chloride flush (NS) 0.9 % injection 10-40 mL (has no administration in time range)  amLODipine (NORVASC) tablet 10 mg (has no administration in time range)  calcitRIOL (ROCALTROL) capsule 0.5 mcg (has no administration in time range)  labetalol (NORMODYNE) tablet 50 mg (has no administration in time range)  levothyroxine (SYNTHROID) tablet 137 mcg (  has no administration in time range)  megestrol (MEGACE) tablet 40 mg (has no administration in time range)  sulfamethoxazole-trimethoprim (BACTRIM DS) 800-160 MG  per tablet 2 tablet (has no administration in time range)  Tiotropium Bromide Monohydrate AERS 1 puff (has no administration in time range)  escitalopram (LEXAPRO) tablet 20 mg (has no administration in time range)  lactated ringers infusion ( Intravenous New Bag/Given 07/26/19 2147)  rivaroxaban (XARELTO) tablet 20 mg (has no administration in time range)  sodium chloride 0.9 % bolus 500 mL (500 mLs Intravenous New Bag/Given 07/26/19 1943)    Mobility non-ambulatory     Focused Assessments NA   R Recommendations: See Admitting Provider Note  Report given to:   Additional Notes:

## 2019-07-26 NOTE — ED Provider Notes (Signed)
[facility] EMERGENCY DEPARTMENT Provider Note   CSN: [id number] Arrival date & time: 07/26/19  1329     History   Chief Complaint Chief Complaint  Patient presents with   Abnormal Lab    HPI Rhonda Hale is a 73 y.o. female.     The history is provided by the patient.  Altered Mental Status Presenting symptoms: behavior changes and confusion   Severity:  Moderate Episode history:  Single Timing:  Constant Chronicity:  New Context: not dementia   Associated symptoms: no abdominal pain, no fever, no headaches, no light-headedness, no nausea, no palpitations, no rash and no vomiting     Past Medical History:  Diagnosis Date   Cancer [facility])     Patient Active Problem List   Diagnosis Date Noted   Hyperkalemia 07/26/2019   Multifocal pneumonia 07/13/2019   PRES (posterior reversible encephalopathy syndrome) 07/07/2019   AKI (acute kidney injury) ([place]) 07/03/2019   Hypercalcemia 07/03/2019   Hypomagnesemia, chronic 07/03/2019    History reviewed. No pertinent surgical history.   OB History   No obstetric history on file.      Home Medications    Prior to Admission medications   Medication Sig Start Date End Date Taking? Authorizing Provider  amLODipine (NORVASC) 10 MG tablet Take 1 tablet (10 mg total) by mouth daily. 07/18/19   [name], MD  calcitRIOL (ROCALTROL) 0.5 MCG capsule Take 0.5 mcg by mouth 2 (two) times a day. 05/13/19   [provider]  Calcium Carb-Cholecalciferol (CALCIUM-VITAMIN D) 600-400 MG-UNIT TABS Take 1 tablet by mouth 2 (two) times a day.    [provider]  dexamethasone (DECADRON) 4 MG tablet Take 4 mg by mouth as needed. After Chemotherapy 06/07/19   [provider]  escitalopram (LEXAPRO) 20 MG tablet Take 20 mg by mouth daily.    [provider]  gabapentin (NEURONTIN) 300 MG capsule Take 600 mg by mouth at bedtime. 05/03/19   [provider]  labetalol  (NORMODYNE) 100 MG tablet Take 0.5 tablets (50 mg total) by mouth 2 (two) times daily. 07/17/19   [name], MD  levothyroxine (SYNTHROID) 137 MCG tablet Take 137 mcg by mouth daily before breakfast.    [provider]  lidocaine-prilocaine (EMLA) cream Apply 1 application topically as needed. 08/17/18 08/17/19  [provider]  ondansetron (ZOFRAN) 8 MG tablet Take 8 mg by mouth every 8 (eight) hours as needed for nausea or vomiting.    [provider]  predniSONE (DELTASONE) 20 MG tablet Take 40 mg once daily for 8 days then 20 mg once daily for 11 days. 07/17/19   [name], MD  rivaroxaban (XARELTO) 20 MG TABS tablet Take 20 mg by mouth daily.    [provider]  sulfamethoxazole-trimethoprim (BACTRIM DS) 800-160 MG tablet Take 2 tablets by mouth every 8 (eight) hours for 16 days. 07/17/19 08/02/19  [name], MD    Family History No family history on file.  Social History Social History   Tobacco Use   Smoking status: Never Smoker   Smokeless tobacco: Never Used  Substance Use Topics   Alcohol use: Not Currently   Drug use: Never     Allergies   Atorvastatin   Review of Systems Review of Systems  Constitutional: Positive for appetite change, fatigue and unexpected weight change. Negative for chills, diaphoresis and fever.  HENT: Negative for congestion.   Respiratory: Negative for cough, chest tightness, shortness of breath and wheezing.  Cardiovascular: Negative for chest pain and palpitations.  Gastrointestinal: Positive for diarrhea. Negative for abdominal pain, constipation, nausea and vomiting.  Genitourinary: Negative for dysuria and flank pain.  Musculoskeletal: Negative for back pain, neck pain and neck stiffness.  Skin: Negative for rash and wound.  Neurological: Negative for light-headedness, numbness and headaches.  Psychiatric/Behavioral: Positive for confusion.  All other systems reviewed and are  negative.    Physical Exam Updated Vital Signs BP 112/66 (BP Location: Right Arm)    Pulse 82    Temp 98.4 F (36.9 C) (Oral)    Resp 20    SpO2 99%   Physical Exam Vitals signs and nursing note reviewed.  Constitutional:      General: She is not in acute distress.    Appearance: She is well-developed. She is not ill-appearing, toxic-appearing or diaphoretic.  HENT:     Head: Normocephalic and atraumatic.     Right Ear: External ear normal.     Left Ear: External ear normal.     Nose: Nose normal.     Mouth/Throat:     Pharynx: No oropharyngeal exudate.  Eyes:     Conjunctiva/sclera: Conjunctivae normal.     Pupils: Pupils are equal, round, and reactive to light.  Neck:     Musculoskeletal: Normal range of motion and neck supple. No muscular tenderness.  Cardiovascular:     Rate and Rhythm: Normal rate.     Heart sounds: No murmur.  Pulmonary:     Effort: No respiratory distress.     Breath sounds: No stridor. No wheezing, rhonchi or rales.  Chest:     Chest wall: No tenderness.  Abdominal:     General: Abdomen is flat. There is no distension.     Tenderness: There is no abdominal tenderness. There is no rebound.  Musculoskeletal:        General: No tenderness.     Right lower leg: No edema.     Left lower leg: No edema.  Skin:    General: Skin is warm.     Findings: No erythema or rash.  Neurological:     Mental Status: She is alert and oriented to person, place, and time.     Sensory: No sensory deficit.     Motor: No weakness or abnormal muscle tone.     Coordination: Coordination normal.     Deep Tendon Reflexes: Reflexes are normal and symmetric.  Psychiatric:        Mood and Affect: Mood normal.      ED Treatments / Results  Labs (all labs ordered are listed, but only abnormal results are displayed) Labs Reviewed  COMPREHENSIVE METABOLIC PANEL - Abnormal; Notable for the following components:      Result Value   Sodium 127 (*)    Chloride 96 (*)     CO2 17 (*)    Glucose, Bld 111 (*)    BUN 36 (*)    Creatinine, Ser 1.80 (*)    Calcium 10.7 (*)    Total Protein 6.3 (*)    GFR calc non Af Amer 27 (*)    GFR calc Af Amer 31 (*)    All other components within normal limits  CBC WITH DIFFERENTIAL/PLATELET - Abnormal; Notable for the following components:   WBC 14.6 (*)    RBC 2.81 (*)    Hemoglobin 9.1 (*)    HCT 26.8 (*)    RDW 20.9 (*)    nRBC 0.6 (*)    Neutro  Abs 11.1 (*)    Monocytes Absolute 1.2 (*)    Abs Immature Granulocytes 0.74 (*)    All other components within normal limits  C DIFFICILE QUICK SCREEN W PCR REFLEX  SARS CORONAVIRUS 2 (HOSPITAL ORDER, PERFORMED IN [facility])  URINE CULTURE  GASTROINTESTINAL PANEL BY PCR, STOOL (REPLACES STOOL CULTURE)  CULTURE, BLOOD (ROUTINE X 2)  CULTURE, BLOOD (ROUTINE X 2)  AMMONIA  URINALYSIS, ROUTINE W REFLEX MICROSCOPIC  COMPREHENSIVE METABOLIC PANEL  CBC    EKG None  Radiology Dg Chest Portable 1 View  Result Date: 07/26/2019 CLINICAL DATA:  73 year old female with recent pneumonia and hospital admission 1 week ago. EXAM: PORTABLE CHEST 1 VIEW COMPARISON:  Chest radiograph dated 07/10/2019 and CT dated 07/11/2019 FINDINGS: Left lung base hazy density likely combination of a small pleural effusion and associated left lung base atelectasis or infiltrate. Overall there has been interval improvement of the aeration of the lungs and clearance of the hazy densities in the left upper lobe and right lung base. There is no pneumothorax. The cardiac silhouette is within normal limits. Right pectoral Port-A-Cath with tip at the cavoatrial junction. No acute osseous pathology. Left upper lobe surgical sutures noted. IMPRESSION: Small left pleural effusion and left lung base atelectasis or infiltrate. Overall interval improvement of the aeration of the lungs compared to the prior radiograph. Electronically Signed   By: Anner Crete M.D.   On: 07/26/2019 19:31     Procedures Procedures (including critical care time)  Medications Ordered in ED Medications  acetaminophen (TYLENOL) tablet 650 mg (has no administration in time range)    Or  acetaminophen (TYLENOL) suppository 650 mg (has no administration in time range)  ondansetron (ZOFRAN) tablet 4 mg (has no administration in time range)    Or  ondansetron (ZOFRAN) injection 4 mg (has no administration in time range)  sodium chloride flush (NS) 0.9 % injection 10-40 mL (has no administration in time range)  amLODipine (NORVASC) tablet 10 mg (has no administration in time range)  calcitRIOL (ROCALTROL) capsule 0.5 mcg (has no administration in time range)  labetalol (NORMODYNE) tablet 50 mg (has no administration in time range)  levothyroxine (SYNTHROID) tablet 137 mcg (has no administration in time range)  megestrol (MEGACE) tablet 40 mg (has no administration in time range)  sulfamethoxazole-trimethoprim (BACTRIM DS) 800-160 MG per tablet 2 tablet (has no administration in time range)  escitalopram (LEXAPRO) tablet 20 mg (has no administration in time range)  lactated ringers infusion ( Intravenous New Bag/Given 07/26/19 2147)  rivaroxaban (XARELTO) tablet 20 mg (has no administration in time range)  umeclidinium bromide (INCRUSE ELLIPTA) 62.5 MCG/INH 1 puff (has no administration in time range)  sodium chloride 0.9 % bolus 500 mL (500 mLs Intravenous New Bag/Given 07/26/19 1943)     Initial Impression / Assessment and Plan / ED Course  I have reviewed the triage vital signs and the nursing notes.  Pertinent labs & imaging results that were available during my care of the patient were reviewed by me and considered in my medical decision making (see chart for details).        Rhonda Hale is a 74 y.o. female with history of ovarian cancer status post chemotherapy recently admitted for pneumonia and PRES who presents at the direction of her internal medicine resident team PCP for further  evaluation of worsened mental status, diarrhea, decreased oral intake, fatigue, and elevated potassium.  According to patient and family who came with her, patient was discharged  just over a week ago.  For the last several days patient has had worsening mental status with delirium and hallucinations.  Patient is acting confused.  She is not having new headaches, chest pain, shortness of breath, abdominal pain.  She reports her cough is improved after the antibiotics for the pneumonia.  She denies any urinary symptoms reports he is having diarrhea.  Family reports he is not eating or drinking as much and thinks she has lost approximately 16 pounds in the last week and a half since discharge.  Patient feels somewhat dehydrated.  Patient had lab work done yesterday showing concern for elevated potassium of 5.9.  They were told to come the emergency department for the hyperkalemia as well as further evaluation of her other complaints including the likely delirium.  On exam, patient is alert and oriented.  Lungs are clear and chest is nontender.  Abdomen is nontender.  Patient moving all extremities.  Patient's vital signs reassuring on my initial evaluation and she was not hypotensive.  Clinically I am concerned patient is delirious either from dehydration with her diarrhea and decreased oral intake and weight loss versus other abnormalities.  Patient has not screening labs in triage showing that although her potassium is now normal, her kidney function is worsening.  It is now 1.8 which is higher than it was at discharge last week.  Patient will be given some fluids for rehydration.  Will repeat urine and a chest x-ray as well as a coronavirus test.  Patient will be called for admission given the delirium and worsening dehydration with acute kidney injury to the internal medicine teaching service.  Teaching service was called and will admit patient for further management.  Patient admitted.     Final  Clinical Impressions(s) / ED Diagnoses   Final diagnoses:  AKI (acute kidney injury) ([place])  Delirium     Clinical Impression: 1. AKI (acute kidney injury) ([place])   2. Delirium     Disposition: Admit  This note was prepared with assistance of Dragon voice recognition software. Occasional wrong-word or sound-a-like substitutions may have occurred due to the inherent limitations of voice recognition software.       Bernhardt Riemenschneider, Gwenyth Allegra, MD 07/27/19 [phone]

## 2019-07-26 NOTE — Assessment & Plan Note (Signed)
Patient's most recent labs from her current clinic visit showed hyperkalemia of 5.9. The patient's  hyperkalemia could be due to combination of limited mobility, acute increase in creatinine from 1.2 to 1.39, and currently taking Bactrim for PCP prophylaxis.  I spoke with the patient's daughter who stated that her mother has had worsening confusion since her clinic visit.  I advised her to take the patient to the ED for further evaluation and management of her hyperkalemia at this time.

## 2019-07-26 NOTE — ED Triage Notes (Signed)
As recently admitted to the hospital and diagnosed with PRES, pt also had pneumonia fevers and confusion. Pt has since went home where she lives with daughter- pt was called today by IM resident and told to come back to ER due to K level of 5.9 along with worsening of confusion per daughter.

## 2019-07-26 NOTE — Assessment & Plan Note (Addendum)
Patient was recently discharged from the hospital for press syndrome likely 2/2 to bevacizumab.For ovarian cancer.  Patient's blood pressure was well controlled at today's visit at 122/74.  Only taking amlodipine 10 mg, labetalol 200 mg without side effect.  Patient's daughter states the patient continues to have periods of confusion and visual hallucinations.  Patient has home PT that has helped to improve her mobility but continues to have difficulty with lower extremity ataxia and weakness.  Patient has had worsening of bowel and bladder incontinence since being discharged.  Plan: -Continue blood pressure medications as patient is well controlled.  She will likely need to remain on blood pressure medication. -Patient has a follow-up appointment with oncology in 2 weeks in order to discuss goals of care, considering they no longer plan to use bevacizumab at this time. -We will follow-up with patient after her oncology appointment in 2 weeks to continue to manage her blood pressure. - Referred to neurology for further management. -Ordered CMP and CBC today.

## 2019-07-26 NOTE — Progress Notes (Signed)
New Admission Note: ? Arrival Method: via stretcher Mental Orientation: A/O x 3 Telemetry: None Assessment: Completed Skin: Refer to flowsheet IV: Right chest implanted port Pain: none Tubes: Safety Measures: Safety Fall Prevention Plan discussed with patient. Admission: Completed [facility] [place] Orientation: Patient has been orientated to the room, unit and the staff. Family: Daughter at bedside with patient due to patient being confused.  Orders have been reviewed and are being implemented. Will continue to monitor the patient. Call light has been placed within reach and bed alarm has been activated.  ? [employer], [place]

## 2019-07-26 NOTE — H&P (Addendum)
Date: 07/26/2019               Patient Name:  Rhonda Hale MRN: [id number]  DOB: 1946/03/31 Age / Sex: 73 y.o., female   PCP: System, Pcp Not In         Medical Service: Internal Medicine Teaching Service         Attending Physician: Dr. Daryll Drown, Peri Jefferson, MD    First Contact: Gilford Rile, MD, Remo Lipps Pager: [phone])  Second Contact: Tarri Abernethy, MD, Larkin Ina  Pager: [facility] [phone])       After Hours (After 5p/  First Contact Pager: [phone]  weekends / holidays): Second Contact Pager: [phone]   Chief Complaint: confusion/hyperkalemia  History of Present Illness: 73 y.o. yo female w/ PMH significant for metastatic ovarian cancer and macrocytic anemia found to have PRES syndrome on last hospital admission presenting to ED from home for worsening confusion and abnormal lab result. History obtained by patient's daughter (primary caretaker) at bedside. Patient reports improvement in cough since discharge from hospitalization. She continues to take Bactrim 600mg  tid and prednisone 40mg  daily. Per patient's daughter, patient has had one week of confusion with visual hallucinations and delirium. She reports that patient has had decreased PO intake, resulting in a >10lb weight loss since discharge.  She also reports urinary and fecal incontinence at baseline but has had multiple episodes of diarrhea today. Patient also has worsening total-body tremors per daughter. Patient denies any active visual or auditory hallucinations during exam, headache, dizziness, visual changes, fevers/chills, worsening cough, abdominal pain, nausea/vomiting, chest pain, shortness of breath, focal or generalized weakness, or urinary symptoms.  Patient was seen in clinic on Wednesday for hospital follow up visit. On labs, patient found to have hyperkalemia (5.9). Patient instructed to follow up in ED.   ED course: In the Ed patient was evaluated for worsening confusion and hyperkalemia. Potassium 5.1 in ED but noted  worsening renal function with BUN/Cr 36/1.80 in setting of decreased PO intake and diarrhea. Patient started on IVF in ED for dehydration.     Lab Orders     SARS CORONAVIRUS 2 Nasal Swab Aptima Multi Swab     Urine culture     Comprehensive metabolic panel     CBC with Differential     Urinalysis, Routine w reflex microscopic     Ammonia     Comprehensive metabolic panel     CBC   Meds: Current Meds  Medication Sig  . amLODipine (NORVASC) 10 MG tablet Take 1 tablet (10 mg total) by mouth daily.  . calcitRIOL (ROCALTROL) 0.5 MCG capsule Take 0.5 mcg by mouth 2 (two) times a day.  . Calcium Carb-Cholecalciferol (CALCIUM-VITAMIN D) 600-400 MG-UNIT TABS Take 1 tablet by mouth 2 (two) times a day.  . escitalopram (LEXAPRO) 20 MG tablet Take 20 mg by mouth daily.  Marland Kitchen labetalol (NORMODYNE) 100 MG tablet Take 0.5 tablets (50 mg total) by mouth 2 (two) times daily.  Marland Kitchen levothyroxine (SYNTHROID) 137 MCG tablet Take 137 mcg by mouth daily before breakfast.  . lidocaine-prilocaine (EMLA) cream Apply 1 application topically daily as needed (prior to port access).   . megestrol (MEGACE) 20 MG tablet Take 40 mg by mouth 2 (two) times daily.  . predniSONE (DELTASONE) 20 MG tablet Take 40 mg once daily for 8 days then 20 mg once daily for 11 days. (Patient taking differently: Take 20-40 mg by mouth See admin instructions. Ordered 07/17/19: Take two tablets (40 mg) once daily for 8  days. Then take one tablet  (20 mg) once daily for 11 days.)  . rivaroxaban (XARELTO) 20 MG TABS tablet Take 20 mg by mouth at bedtime.   . sulfamethoxazole-trimethoprim (BACTRIM DS) 800-160 MG tablet Take 2 tablets by mouth every 8 (eight) hours for 16 days.  . Tiotropium Bromide Monohydrate (SPIRIVA RESPIMAT) 1.25 MCG/ACT AERS Inhale 1 puff into the lungs daily as needed (shortness of breath).     Allergies: Allergies as of 07/26/2019 - Review Complete 07/26/2019  Allergen Reaction Noted  . Atorvastatin Other (See Comments)  06/21/2017   Past Medical History:  Diagnosis Date  . Cancer [facility])     Family History: No family history on file.   Social History:  Social History   Tobacco Use  . Smoking status: Never Smoker  . Smokeless tobacco: Never Used  Substance Use Topics  . Alcohol use: Not Currently  . Drug use: Never     Review of Systems: A complete ROS was negative except as per HPI.   Physical Exam: Blood pressure 112/66, pulse 82, temperature 98.4 F (36.9 C), temperature source Oral, resp. rate 20, SpO2 99 %.   Physical Exam  Constitutional: She is oriented to person, place, and time and well-developed, well-nourished, and in no distress.  HENT:  Head: Normocephalic and atraumatic.  Eyes: Conjunctivae and EOM are normal.  Neck: Normal range of motion. Neck supple.  Cardiovascular: Normal rate, regular rhythm, normal heart sounds and intact distal pulses. Exam reveals no gallop and no friction rub.  No murmur heard. Pulmonary/Chest: Effort normal and breath sounds normal. No respiratory distress. She has no wheezes. She has no rales.  Abdominal: Soft. Bowel sounds are normal. She exhibits no distension and no mass. There is no abdominal tenderness. There is no guarding.  Musculoskeletal: Normal range of motion.        General: No tenderness, deformity or edema.  Neurological: She is alert and oriented to person, place, and time. No cranial nerve deficit.  Skin: Skin is warm and dry. No rash noted. No erythema. No pallor.    EKG: personally reviewed my interpretation is NSR, anteroseptal infarct of undetermined age Rate 48, PR interval 158ms, QRS 2ms, QT/QTc 364/440ms  CXR: personally reviewed my interpretation is stable perihilar markings. Vascular congestion improved from previous CXR on 7/29. Right IJ Port-a-cath present and unchanged from previous CXR.   Assessment & Plan by Problem: Active Problems:   Dehydration  AKI 2/2 to dehydration:  Patient presents to hospital with  worsening confusion for one week per daughter. She also reports that patient has had decreased PO intake during this time and has lost >10lbs since last discharge due to poor appetite. She also noted one day history of diarrhea in setting of urinary and fecal incontinence. Patient denies any fevers, chills, abdominal pain, nausea or vomiting. BUN/Cr increased to 36/1.80 from 10/1.2 at discharge. Fluid resuscitation started in ED.   - Continue IVF - Avoid nephrotoxic agents  - Strict I/O monitoring  - Follow up GI pathogen panel - Follow up C.diff results    Altered mental status:  Per patient's daughter, patient has been experiencing worsening confusion for one week with visual hallucinations and delirium. She becomes lucid at times. Of note, patient is taking Bactrim 600mg  tid and Prednisone 40mg  qd since discharge from previous hospitalization. On examination, patient is alert and oriented to person, place, time and situation. She does not appear to be having any internal monologues or any visual cues in the room.  She denies current visual or auditory hallucinations.   - Continue to monitor for changes in mental status  - Follow up UA and UCx results  - Follow up blood cultures and GI panel   Hyperkalemia:  Patient found to have hyperkalemia (5.9) on clinic labs at hospital follow up visit. She was instructed to follow up in ED. Potassium in ED 5.1. No significant changes noted on EKG. Patient is currently asymptomatic.  - Follow up BMP  - Continue to monitor   Hx of metastatic ovarian cancer:  Patient being followed by Dr. Sherryle Lis. Recently started on Megace 40mg  bid for poor appetite. Patient was previously on chemotherapy but this was discontinued as she developed PRES syndrome. Per Dr. Maxie Barb note, will continue off of chemotherapy and plan for goals of care discussion at future visits.  - Continue Megace 40mg  bid   Hx of PE:  Patient takes Xarelto 20mg  at home since PE in  April 2020.   -Continue Xarelto 20mg  qd   Hypothyroidism:  Synthroid 137mg    DVT Prophylaxis: Xarelto Diet: Regular  Code: Limited code/DNI  Dispo: Admit patient to Observation with expected length of stay less than 2 midnights.  Signed: Harvie Heck, MD  Internal Medicine, PGY-1 07/26/2019, 6:52 PM  Pager: [phone]

## 2019-07-26 NOTE — Telephone Encounter (Signed)
Spoke with the patient's daughter about recent lab results showing hyperkalemia.  Advised them of the importance of going to the ER for further evaluation and management.  The patient's daughter expressed understanding and agreed with plan.  Please see my most recent clinic note with this patient for additional details.

## 2019-07-26 NOTE — ED Notes (Signed)
Attempted Report 

## 2019-07-27 ENCOUNTER — Observation Stay (HOSPITAL_COMMUNITY): Payer: Medicare Other

## 2019-07-27 DIAGNOSIS — E860 Dehydration: Secondary | ICD-10-CM | POA: Diagnosis present

## 2019-07-27 DIAGNOSIS — G92 Toxic encephalopathy: Secondary | ICD-10-CM | POA: Diagnosis not present

## 2019-07-27 DIAGNOSIS — I10 Essential (primary) hypertension: Secondary | ICD-10-CM | POA: Diagnosis not present

## 2019-07-27 DIAGNOSIS — Z8543 Personal history of malignant neoplasm of ovary: Secondary | ICD-10-CM | POA: Diagnosis not present

## 2019-07-27 DIAGNOSIS — Z79899 Other long term (current) drug therapy: Secondary | ICD-10-CM | POA: Diagnosis not present

## 2019-07-27 DIAGNOSIS — F22 Delusional disorders: Secondary | ICD-10-CM | POA: Diagnosis present

## 2019-07-27 DIAGNOSIS — D649 Anemia, unspecified: Secondary | ICD-10-CM | POA: Diagnosis not present

## 2019-07-27 DIAGNOSIS — N189 Chronic kidney disease, unspecified: Secondary | ICD-10-CM | POA: Diagnosis not present

## 2019-07-27 DIAGNOSIS — G9340 Encephalopathy, unspecified: Secondary | ICD-10-CM | POA: Diagnosis not present

## 2019-07-27 DIAGNOSIS — R197 Diarrhea, unspecified: Secondary | ICD-10-CM | POA: Diagnosis present

## 2019-07-27 DIAGNOSIS — D638 Anemia in other chronic diseases classified elsewhere: Secondary | ICD-10-CM | POA: Diagnosis not present

## 2019-07-27 DIAGNOSIS — C569 Malignant neoplasm of unspecified ovary: Secondary | ICD-10-CM | POA: Diagnosis not present

## 2019-07-27 DIAGNOSIS — N183 Chronic kidney disease, stage 3 (moderate): Secondary | ICD-10-CM | POA: Diagnosis present

## 2019-07-27 DIAGNOSIS — R4182 Altered mental status, unspecified: Secondary | ICD-10-CM | POA: Diagnosis present

## 2019-07-27 DIAGNOSIS — R278 Other lack of coordination: Secondary | ICD-10-CM | POA: Diagnosis present

## 2019-07-27 DIAGNOSIS — E875 Hyperkalemia: Secondary | ICD-10-CM | POA: Diagnosis present

## 2019-07-27 DIAGNOSIS — G255 Other chorea: Secondary | ICD-10-CM | POA: Diagnosis present

## 2019-07-27 DIAGNOSIS — E039 Hypothyroidism, unspecified: Secondary | ICD-10-CM | POA: Diagnosis present

## 2019-07-27 DIAGNOSIS — Z79890 Hormone replacement therapy (postmenopausal): Secondary | ICD-10-CM | POA: Diagnosis not present

## 2019-07-27 DIAGNOSIS — N289 Disorder of kidney and ureter, unspecified: Secondary | ICD-10-CM | POA: Diagnosis not present

## 2019-07-27 DIAGNOSIS — Z7901 Long term (current) use of anticoagulants: Secondary | ICD-10-CM | POA: Diagnosis not present

## 2019-07-27 DIAGNOSIS — N179 Acute kidney failure, unspecified: Secondary | ICD-10-CM | POA: Diagnosis not present

## 2019-07-27 DIAGNOSIS — E871 Hypo-osmolality and hyponatremia: Secondary | ICD-10-CM | POA: Diagnosis present

## 2019-07-27 DIAGNOSIS — Z9889 Other specified postprocedural states: Secondary | ICD-10-CM

## 2019-07-27 DIAGNOSIS — E872 Acidosis: Secondary | ICD-10-CM | POA: Diagnosis not present

## 2019-07-27 DIAGNOSIS — T380X5A Adverse effect of glucocorticoids and synthetic analogues, initial encounter: Secondary | ICD-10-CM | POA: Diagnosis present

## 2019-07-27 DIAGNOSIS — I129 Hypertensive chronic kidney disease with stage 1 through stage 4 chronic kidney disease, or unspecified chronic kidney disease: Secondary | ICD-10-CM | POA: Diagnosis present

## 2019-07-27 DIAGNOSIS — Z888 Allergy status to other drugs, medicaments and biological substances status: Secondary | ICD-10-CM | POA: Diagnosis not present

## 2019-07-27 DIAGNOSIS — R946 Abnormal results of thyroid function studies: Secondary | ICD-10-CM | POA: Diagnosis not present

## 2019-07-27 DIAGNOSIS — R251 Tremor, unspecified: Secondary | ICD-10-CM | POA: Diagnosis not present

## 2019-07-27 DIAGNOSIS — R32 Unspecified urinary incontinence: Secondary | ICD-10-CM | POA: Diagnosis present

## 2019-07-27 DIAGNOSIS — N170 Acute kidney failure with tubular necrosis: Secondary | ICD-10-CM | POA: Diagnosis present

## 2019-07-27 DIAGNOSIS — R739 Hyperglycemia, unspecified: Secondary | ICD-10-CM | POA: Diagnosis not present

## 2019-07-27 DIAGNOSIS — Z20828 Contact with and (suspected) exposure to other viral communicable diseases: Secondary | ICD-10-CM | POA: Diagnosis present

## 2019-07-27 DIAGNOSIS — Z9221 Personal history of antineoplastic chemotherapy: Secondary | ICD-10-CM | POA: Diagnosis not present

## 2019-07-27 LAB — URINALYSIS, ROUTINE W REFLEX MICROSCOPIC
Bilirubin Urine: NEGATIVE
Glucose, UA: NEGATIVE mg/dL
Ketones, ur: NEGATIVE mg/dL
Leukocytes,Ua: NEGATIVE
Nitrite: NEGATIVE
Protein, ur: NEGATIVE mg/dL
Specific Gravity, Urine: 1.012 (ref 1.005–1.030)
pH: 6 (ref 5.0–8.0)

## 2019-07-27 LAB — CBC
HCT: 19.8 % — ABNORMAL LOW (ref 36.0–46.0)
HCT: 18.9 % — ABNORMAL LOW (ref 36.0–46.0)
Hemoglobin: 6.5 g/dL — CL (ref 12.0–15.0)
Hemoglobin: 6.3 g/dL — CL (ref 12.0–15.0)
MCH: 32.2 pg (ref 26.0–34.0)
MCH: 32.1 pg (ref 26.0–34.0)
MCHC: 32.8 g/dL (ref 30.0–36.0)
MCHC: 33.3 g/dL (ref 30.0–36.0)
MCV: 98 fL (ref 80.0–100.0)
MCV: 96.4 fL (ref 80.0–100.0)
Platelets: 229 10*3/uL (ref 150–400)
Platelets: 205 10*3/uL (ref 150–400)
RBC: 2.02 MIL/uL — ABNORMAL LOW (ref 3.87–5.11)
RBC: 1.96 MIL/uL — ABNORMAL LOW (ref 3.87–5.11)
RDW: 21.6 % — ABNORMAL HIGH (ref 11.5–15.5)
RDW: 21.6 % — ABNORMAL HIGH (ref 11.5–15.5)
WBC: 15.9 10*3/uL — ABNORMAL HIGH (ref 4.0–10.5)
WBC: 15.2 10*3/uL — ABNORMAL HIGH (ref 4.0–10.5)
nRBC: 0.7 % — ABNORMAL HIGH (ref 0.0–0.2)
nRBC: 0.9 % — ABNORMAL HIGH (ref 0.0–0.2)

## 2019-07-27 LAB — GASTROINTESTINAL PANEL BY PCR, STOOL (REPLACES STOOL CULTURE)

## 2019-07-27 LAB — COMPREHENSIVE METABOLIC PANEL
ALT: 29 U/L (ref 0–44)
AST: 24 U/L (ref 15–41)
Albumin: 2.6 g/dL — ABNORMAL LOW (ref 3.5–5.0)
Alkaline Phosphatase: 40 U/L (ref 38–126)
Anion gap: 10 (ref 5–15)
BUN: 33 mg/dL — ABNORMAL HIGH (ref 8–23)
CO2: 19 mmol/L — ABNORMAL LOW (ref 22–32)
Calcium: 8.9 mg/dL (ref 8.9–10.3)
Chloride: 103 mmol/L (ref 98–111)
Creatinine, Ser: 1.52 mg/dL — ABNORMAL HIGH (ref 0.44–1.00)
GFR calc Af Amer: 38 mL/min — ABNORMAL LOW (ref >60)
GFR calc non Af Amer: 33 mL/min — ABNORMAL LOW (ref >60)
Glucose, Bld: 82 mg/dL (ref 70–99)
Potassium: 4.2 mmol/L (ref 3.5–5.1)
Sodium: 132 mmol/L — ABNORMAL LOW (ref 135–145)
Total Bilirubin: <0.1 mg/dL — ABNORMAL LOW (ref 0.3–1.2)
Total Protein: 4.6 g/dL — ABNORMAL LOW (ref 6.5–8.1)

## 2019-07-27 LAB — PREPARE RBC (CROSSMATCH)

## 2019-07-27 LAB — CK: Total CK: 75 U/L (ref 38–234)

## 2019-07-27 MED ORDER — SODIUM CHLORIDE 0.9% IV SOLUTION
Freq: Once | INTRAVENOUS | Status: AC
  Administered 2019-07-27: 14:00:00 via INTRAVENOUS

## 2019-07-27 MED ORDER — ADULT MULTIVITAMIN W/MINERALS CH
1.0000 | ORAL_TABLET | Freq: Every day | ORAL | Status: DC
  Administered 2019-07-28 – 2019-08-02 (×6): 1 via ORAL
  Filled 2019-07-27 (×6): qty 1

## 2019-07-27 MED ORDER — PRO-STAT SUGAR FREE PO LIQD
30.0000 mL | Freq: Two times a day (BID) | ORAL | Status: DC
  Administered 2019-07-27 – 2019-07-31 (×9): 30 mL via ORAL
  Filled 2019-07-27 (×10): qty 30

## 2019-07-27 MED ORDER — ENSURE MAX PROTEIN PO LIQD
11.0000 [oz_av] | Freq: Every day | ORAL | Status: DC
  Administered 2019-07-29 – 2019-07-30 (×2): 11 [oz_av] via ORAL
  Filled 2019-07-27 (×6): qty 330

## 2019-07-27 NOTE — Plan of Care (Signed)
  Problem: Clinical Measurements: Goal: Ability to maintain clinical measurements within normal limits will improve Outcome: Progressing   Problem: Activity: Goal: Risk for activity intolerance will decrease Outcome: Progressing   Problem: Nutrition: Goal: Adequate nutrition will be maintained Outcome: Progressing   Problem: Coping: Goal: Level of anxiety will decrease Outcome: Progressing   

## 2019-07-27 NOTE — Progress Notes (Signed)
  Date: 07/27/2019  Patient name: Rhonda Hale  Medical record number: [id number]  Date of birth: 1946/07/31   I have seen and evaluated Delice Bison and discussed their care with the Residency Team. Briefly, Ms. Kontz is a 73 year old woman with metastatic ovarian cancer who presented for encephalopathy.  She was noted to have decreased PO intake, delirium, hallucinations.  She is taking prednisone and bactrim based on recommendations from last hospitalization.  Steroids held in case these are contributing.  K was 5.9 in the ED and BUN and Cr were elevated.  She was also noted to be anemic today.  Her daughter further noted looser stools than normal, Cdiff negative.    Vitals:   07/27/19 0427 07/27/19 0826  BP: 128/86   Pulse: 74   Resp: 15   Temp: 98.1 F (36.7 C)   SpO2: 96% 96%   Gen: Ill appearing woman, mumbling to herself, alert to voice Eyes: Anicteric HENT: Bald, dry MM CV: RR, NR, no murmur Pulm: CTAB, no wheezing Abd: Soft, NT, +BS MSK: thin, decreased muscle tone Neuro: Tremor of hands noted  Assessment and Plan: I have seen and evaluated the patient as outlined above. I agree with the formulated Assessment and Plan as detailed in the residents' note, with the following changes:   1. AKI 2/2 dehydration - Cr improving, continue fluids - Follow up GI pathogen panel - Trend Cr  2. AMS - Agree with CT head - appears to be stable, no bleed or mass - Holding prednisone for now - UA without sign of infection - BC pending  3. Acute anemia - Transfuse today, trend CBC  Other issues per Dr. Gilford Rile' daily note.   Sid Falcon, MD 8/15/202011:46 AM

## 2019-07-27 NOTE — Progress Notes (Signed)
Initial Nutrition Assessment  DOCUMENTATION CODES:   Obesity unspecified  INTERVENTION:    Trial Ensure MAX Protein po once daily, each supplement provides 150 kcal and 30 grams of protein  30 ml Prostat BID, each supplement provides 100 kcals and 15 grams protein.   MVI daily  NUTRITION DIAGNOSIS:   Increased nutrient needs related to cancer and cancer related treatments as evidenced by estimated needs.  GOAL:   Patient will meet greater than or equal to 90% of their needs   MONITOR:   PO intake, Supplement acceptance, Labs, I & O's, Weight trends  REASON FOR ASSESSMENT:   Malnutrition Screening Tool    ASSESSMENT:   Patient with PMH significant for metastatic ovarian cancer. Found to have PRES syndrome during last admission (8/4). Presents this admission with AMS and hyperkalemia.   RD working remotely.  Spoke with daughter via phone. Reports pt's appetite remained poor after her last admission at the beginning of the month. Family would provide pt with food every 2 hours (PB & J, eggs, bacon, bagel, ice cream, chicken pot pie, and peaches) but she would only take a few bites. Daughter reports she remembers pt last eating a full meal prior to her previous admission. Pt does not like Ensure Enlive or Boost. They provide her with Akins shakes at home. Discussed with daughter that using high calore high protein shakes vs low calorie high protein shakes would be more beneficial. Daughter states pt just has hard time with acceptance. RD to provide Akins like shake for pt's comfort (Ensure MAX). Will make changes as needed.   Daughter reports pt weighed 172 lb last admission and 160 lb at her doctors visit on Wednesday. Records show pt weighing 172 lb this admission. Will monitor trends. Unable to diagnosis malnutrition at this time without NFPE.   Medications: calcitriol, megace 40 mg BID Labs: Na 132 (L)   Diet Order:   Diet Order            Diet regular Room service  appropriate? Yes; Fluid consistency: Thin  Diet effective now              EDUCATION NEEDS:   Education needs have been addressed  Skin:  Skin Assessment: Skin Integrity Issues: Skin Integrity Issues:: Other (Comment) Other: MASD- R arm, back, buttocks  Last BM:  8/14  Height:   Ht Readings from Last 1 Encounters:  07/24/19 5\' 2"  (1.575 m)    Weight:   Wt Readings from Last 1 Encounters:  07/26/19 78 kg    Ideal Body Weight:  50 kg  BMI:  Body mass index is 31.45 kg/m.  Estimated Nutritional Needs:   Kcal:  1750-1950 kcal  Protein:  85-100 grams  Fluid:  >/= 1.7 L/day   Mariana Single RD, LDN Clinical Nutrition Pager # - [phone]

## 2019-07-27 NOTE — Plan of Care (Signed)
  Problem: Elimination: Goal: Will not experience complications related to bowel motility Outcome: Progressing   

## 2019-07-27 NOTE — Progress Notes (Addendum)
   Subjective:  Rhonda Hale was seen at bedside with her daughter. She was altered, but easily redirected. She states that there was someone in the corner of the room and that it needed to be taken care of. Her daughter states that she has been having episodes of delirium that are worse and night and during the morning for the past several days. They came to the ER due to her elevated potassium, but to also assess her for her new onset delirium. All questions and concerns were addressed.   Objective:  Vital signs in last 24 hours: Vitals:   07/26/19 2100 07/26/19 2240 07/27/19 0427 07/27/19 0826  BP: 111/77 135/69 128/86   Pulse: 84 82 74   Resp: 19 18 15    Temp: 98.5 F (36.9 C) 98.1 F (36.7 C) 98.1 F (36.7 C)   TempSrc: Oral Oral Oral   SpO2: 97% 98% 96% 96%  Weight:  78 kg     Physical Exam Constitutional:      General: She is in acute distress.     Appearance: She is obese. She is ill-appearing.     Comments: Shaking observed throughout the interview. Fragmented speech noted.    HENT:     Head: Normocephalic and atraumatic.  Cardiovascular:     Rate and Rhythm: Normal rate and regular rhythm.     Pulses: Normal pulses.     Heart sounds: No murmur. No gallop.   Pulmonary:     Effort: Pulmonary effort is normal. No respiratory distress.     Breath sounds: No stridor. No wheezing, rhonchi or rales.  Abdominal:     General: Abdomen is flat. Bowel sounds are normal.     Palpations: Abdomen is soft.     Tenderness: There is no abdominal tenderness. There is no guarding or rebound.  Neurological:     Mental Status: She is disoriented.  Psychiatric:     Comments: Patient was speaking to the corner of the room. States there is someone in the corner. Easily redirectable.     Assessment/Plan:  Active Problems:   Dehydration Rhonda Hale is a 73 y/o female that presents to [facility] with an AKI, AMS, and Hyperkalemia.   AKI 2/2 to Dehydration: - Creatinine: 1.52 - Continue IVF  - GI pathogen panel: Pending  - Follow up - C. Diff quick scan: Negative - Continue I/O Monitoring  - Continue to avoid nephrotoxic   Altered Mental Status:  - CTA Head: No acute findings. No intracranial mass, hemorrhage or edema. No correlate to PRES syndrome on brain MRI from 07/06/2019. - Hold home Prednisone in the case AMS is 2/2 to steroid psychosis. - U/A: Rare bacteria, with hgb urine dipstick indicating Large amount.  - Blood cultures: NGTD  New Onset Anemia:  - Hgb: 6.5 - Repeat CBC before transfusion to rule out potential lab error - Type and Scrren - 2 units PRBC - Follow up CBC  Metastatic Ovarian Cancer:  - Continue Megace 40 mg BID  Hypothyroidism:  - Continue Synthroid 137 mg QD.   Hyperkalemia:  - K: 4.1 - Resolved  Dispo: Anticipated discharge pending medical course.   Rhonda Mercury, MD 07/27/2019, 11:46 AM Pager: [phone]

## 2019-07-28 ENCOUNTER — Inpatient Hospital Stay (HOSPITAL_COMMUNITY): Payer: Medicare Other

## 2019-07-28 ENCOUNTER — Encounter (HOSPITAL_COMMUNITY): Payer: Self-pay

## 2019-07-28 DIAGNOSIS — R251 Tremor, unspecified: Secondary | ICD-10-CM

## 2019-07-28 DIAGNOSIS — G9340 Encephalopathy, unspecified: Secondary | ICD-10-CM

## 2019-07-28 LAB — TYPE AND SCREEN
ABO/RH(D): A POS
Antibody Screen: NEGATIVE
Unit division: 0
Unit division: 0

## 2019-07-28 LAB — TSH: TSH: 6.424 u[IU]/mL — ABNORMAL HIGH (ref 0.350–4.500)

## 2019-07-28 LAB — BASIC METABOLIC PANEL
Anion gap: 11 (ref 5–15)
BUN: 21 mg/dL (ref 8–23)
CO2: 19 mmol/L — ABNORMAL LOW (ref 22–32)
Calcium: 8.3 mg/dL — ABNORMAL LOW (ref 8.9–10.3)
Chloride: 102 mmol/L (ref 98–111)
Creatinine, Ser: 1.13 mg/dL — ABNORMAL HIGH (ref 0.44–1.00)
GFR calc Af Amer: 55 mL/min — ABNORMAL LOW (ref >60)
GFR calc non Af Amer: 48 mL/min — ABNORMAL LOW (ref >60)
Glucose, Bld: 63 mg/dL — ABNORMAL LOW (ref 70–99)
Potassium: 3.8 mmol/L (ref 3.5–5.1)
Sodium: 132 mmol/L — ABNORMAL LOW (ref 135–145)

## 2019-07-28 LAB — VITAMIN B12: Vitamin B-12: 568 pg/mL (ref 180–914)

## 2019-07-28 LAB — CBC
HCT: 29.7 % — ABNORMAL LOW (ref 36.0–46.0)
Hemoglobin: 10 g/dL — ABNORMAL LOW (ref 12.0–15.0)
MCH: 31.3 pg (ref 26.0–34.0)
MCHC: 33.7 g/dL (ref 30.0–36.0)
MCV: 92.8 fL (ref 80.0–100.0)
Platelets: 210 10*3/uL (ref 150–400)
RBC: 3.2 MIL/uL — ABNORMAL LOW (ref 3.87–5.11)
RDW: 19.9 % — ABNORMAL HIGH (ref 11.5–15.5)
WBC: 15.7 10*3/uL — ABNORMAL HIGH (ref 4.0–10.5)
nRBC: 1.1 % — ABNORMAL HIGH (ref 0.0–0.2)

## 2019-07-28 LAB — BPAM RBC
Blood Product Expiration Date: 202009122359
Blood Product Expiration Date: 202009122359
ISSUE DATE / TIME: 202008151348
ISSUE DATE / TIME: 202008151638
Unit Type and Rh: 6200
Unit Type and Rh: 6200

## 2019-07-28 LAB — GLUCOSE, CAPILLARY
Glucose-Capillary: 60 mg/dL — ABNORMAL LOW (ref 70–99)
Glucose-Capillary: 81 mg/dL (ref 70–99)

## 2019-07-28 MED ORDER — LORAZEPAM 2 MG/ML IJ SOLN
1.0000 mg | Freq: Once | INTRAMUSCULAR | Status: AC
  Administered 2019-07-28: 1 mg via INTRAVENOUS
  Filled 2019-07-28: qty 1

## 2019-07-28 MED ORDER — QUETIAPINE FUMARATE 50 MG PO TABS
100.0000 mg | ORAL_TABLET | Freq: Every day | ORAL | Status: DC
  Administered 2019-07-28 – 2019-07-30 (×3): 100 mg via ORAL
  Filled 2019-07-28 (×3): qty 2

## 2019-07-28 MED ORDER — SODIUM CHLORIDE 0.9 % IV SOLN
1000.0000 mg | Freq: Every day | INTRAVENOUS | Status: DC
  Administered 2019-07-29 – 2019-07-30 (×2): 1000 mg via INTRAVENOUS
  Filled 2019-07-28 (×2): qty 8

## 2019-07-28 NOTE — Progress Notes (Signed)
Patient unable to complete MRI D/T being restless and not following commands despite Ativan 1 mg IV being administered. MD notified and made aware. MD stated to call back when patient wakes up and that she would order something stronger in order to try to complete the MRI. Orders followed and will continue to monitor.

## 2019-07-28 NOTE — Progress Notes (Signed)
Paged as pt was disoriented and having worsening tremors after returning from MRI. Pt seen at the bedside. Pt was given 1mg  Ativan at 1pm this afternoon for MRI scan. She was unable to stay still for the procedure. When she returned to the floor, pt was alert but not oriented. Having tremors that daughter was concerned were seizures.  On exam, pt is alert though not oriented, responding that she is in [place], [place]. Pt's tremors are in bilateral upper and lower extremities. Do not appear to be seizure activity. They are high amplitude and low frequency. Tremors do not go away with tasks. Pt will follow commands. She has full and equal strength bilaterally with no focal deficits. Pt is in no acute distress. Lung clear to ascultation bilaterally. Regular heart rate and rhthym, normal heart sounds. No abdominal tenderness.  Pt's tremulous activity not concerning for seizure or stroke at this time. BP controlled at 131/73. CT head yesterday was negative for acute findings. Will continue to monitor and hold further benzodiazepines for this pt and defer MRI scan at this time.

## 2019-07-28 NOTE — Progress Notes (Signed)
  Date: 07/28/2019  Patient name: Glendy Barsanti  Medical record number: [id number]  Date of birth: Oct 06, 1946   I have seen and evaluated this patient and I have discussed the plan of care with the house staff. Please see Dr. Gilford Rile' note for complete details. I concur with his findings and plan.   Patient with new tremor, action and rest, occasional myoclonus or hemiballismus noted.  There are reports of this type of tremor after Taxol therapy, however, she has not had this therapy for quite a while.  Agree with imaging and evaluation of vitamin deficiency.  There are case reports of paraneoplastic syndrome associated with ovarian cancer causing this syndrome as well.  If no obvious cause found by work up today, can consider neurology consult for further opinion.   Sid Falcon, MD 07/28/2019, 12:21 PM

## 2019-07-28 NOTE — Progress Notes (Addendum)
   Subjective:  Ms. Bodkins was seen at bedside with her daughter. She states that she is doing better today. Per her daughter, she had a lot of involuntary movement last night, but her delusions are resolving. Ms. Arel states that she has noticed new onset handshaking, which has been bothering her. We also discussed her CT scan, and blood results. All questions and concerns were addressed.   Objective:  Vital signs in last 24 hours: Vitals:   07/27/19 1658 07/27/19 1959 07/27/19 2237 07/28/19 0503  BP: 124/64 133/75 111/77 (!) 100/28  Pulse: 70 73 71 75  Resp: 18 20 19 18   Temp: 97.7 F (36.5 C) 97.6 F (36.4 C) 97.7 F (36.5 C) 97.7 F (36.5 C)  TempSrc: Oral Oral Oral Oral  SpO2: 96%  95% 95%  Weight:   76.7 kg    Physical Exam Vitals signs and nursing note reviewed.  Constitutional:      General: She is not in acute distress.    Appearance: She is obese. She is not ill-appearing.  HENT:     Head: Normocephalic and atraumatic.  Cardiovascular:     Rate and Rhythm: Normal rate and regular rhythm.     Pulses: Normal pulses.     Heart sounds: No murmur. No gallop.   Pulmonary:     Effort: Pulmonary effort is normal. No respiratory distress.     Breath sounds: No stridor. No wheezing, rhonchi or rales.  Abdominal:     General: Abdomen is flat. Bowel sounds are normal.     Palpations: Abdomen is soft.     Tenderness: There is no abdominal tenderness. There is no guarding or rebound.  Neurological:     Mental Status: She is disoriented.     Coordination: Coordination abnormal.     Comments: Could not complete Finger to nose testing.  Hand tremor noted at rest and when active.     Assessment/Plan:  Principal Problem:   Encephalopathy Active Problems:   AKI (acute kidney injury) ([place])   Dehydration   AMS (altered mental status) Rhonda Hale is a 73 y/o female that presents to [facility] with an AKI, AMS, and Hyperkalemia.   New Onset Tremors:  Ms. Zuha Rakestraw's  complaints of tremors is concerning. While this may be a side effect of her Paxil, in the setting of her ovarian cancer, and MRI of the brain will be ordered to R/U possible neurological insult or new onset mass. We will also screen here to ensure that this is not an endocrinological in nature or a vitamin deficiency.  - Ordered MRI brain w w/o contrast.  - Ordered PT/OT eval and treat - Ordered B12 - Ordered TSH  Altered Mental Status:  - Hold home Prednisone in the case AMS is 2/2 to steroid psychosis. - U/A: Rare bacteria, with hgb urine dipstick indicating Large amount.  - Blood cultures: NGTD <12 hours  AKI 2/2 to Dehydration: - Creatinine: 1.13 - GI pathogen panel: Negative - Continue IVF - Continue I/O Monitoring  - Continue to avoid nephrotoxic drugs   New Onset Anemia:  - Hgb: 10.0 - Follow up CBC  Metastatic Ovarian Cancer:  - Continue Megace 40 mg BID  Hypothyroidism:  - Continue Synthroid 137 mg QD.    Hyperkalemia: Resolved  Dispo: Anticipated discharge pending medical course.   Maudie Mercury, MD 07/28/2019, 6:05 AM Pager: [phone]

## 2019-07-28 NOTE — Progress Notes (Signed)
Anissa RN states the patient has no fluids at this time running and can administer the ativan and flush when complete.

## 2019-07-28 NOTE — Consult Note (Addendum)
NEURO HOSPITALIST CONSULT NOTE   Requestig physician: Dr. Daryll Drown  Reason for Consult: Delirium  History obtained from:  Family and Chart    HPI:                                                                                                                                          Rhonda Hale is an 73 y.o. female presenting with gradually worsening altered mental status and agitation since Tuesday. Also has had choreiform movements that have worsened to what is now almost constant writhing and twitching this evening. She has a history of metastatic ovarian cancer with last chemotherapy in July. She was recently admitted and managed for PRES.   Additional history is obtained from 8/14 H and P in Epic after discussion with the Internal Medicine team: :"73 y.o. yo female w/ PMH significant for metastatic ovarian cancer and macrocytic anemia found to have PRES syndrome on last hospital admission presenting to ED from home for worsening confusion and abnormal lab result. History obtained by patient's daughter (primary caretaker) at bedside. Patient reports improvement in cough since discharge from hospitalization. She continues to take Bactrim 600mg  tid and prednisone 40mg  daily. Per patient's daughter, patient has had one week of confusion with visual hallucinations and delirium. She reports that patient has had decreased PO intake, resulting in a >10lb weight loss since discharge.  She also reports urinary and fecal incontinence at baseline but has had multiple episodes of diarrhea today. Patient also has worsening total-body tremors per daughter. Patient denies any active visual or auditory hallucinations during exam, headache, dizziness, visual changes, fevers/chills, worsening cough, abdominal pain, nausea/vomiting, chest pain, shortness of breath, focal or generalized weakness, or urinary symptoms. Patient was seen in clinic on Wednesday for hospital follow up visit. On labs,  patient found to have hyperkalemia (5.9). Patient instructed to follow up in ED. ED course: In the Ed patient was evaluated for worsening confusion and hyperkalemia. Potassium 5.1 in ED but noted worsening renal function with BUN/Cr 36/1.80 in setting of decreased PO intake and diarrhea. Patient started on IVF in ED for dehydration."  MRI 07/06/19:  Abnormal hyperintense T2-weighted signal within the thalami, both posterior hemispheres and both cerebellar hemispheres in a pattern most consistent with posterior reversible encephalopathy syndrome (PRES).  Past Medical History:  Diagnosis Date  . Cancer [facility])     Past Surgical History:  Procedure Laterality Date  . ABDOMINAL HYSTERECTOMY N/A     No family history on file.            Social History:  reports that she has never smoked. She has never used smokeless tobacco. She reports previous alcohol use. She reports that she does not use drugs.  Allergies  Allergen Reactions  . Atorvastatin Other (See Comments)  Severe heartburn, reflux    MEDICATIONS:                                                                                                                     Scheduled: . amLODipine  10 mg Oral Daily  . calcitRIOL  0.5 mcg Oral Daily  . escitalopram  20 mg Oral Daily  . feeding supplement (PRO-STAT SUGAR FREE 64)  30 mL Oral BID  . labetalol  50 mg Oral BID  . levothyroxine  137 mcg Oral QAC breakfast  . megestrol  40 mg Oral BID  . multivitamin with minerals  1 tablet Oral Daily  . Ensure Max Protein  11 oz Oral Daily  . QUEtiapine  100 mg Oral QHS  . rivaroxaban  20 mg Oral QHS  . sulfamethoxazole-trimethoprim  2 tablet Oral Q8H  . umeclidinium bromide  1 puff Inhalation Daily   Continuous: . [START ON 07/29/2019] methylPREDNISolone (SOLU-MEDROL) injection       ROS:                                                                                                                                       Unable to  obtain due to delirium.    Blood pressure (!) 149/67, pulse 97, temperature 98.9 F (37.2 C), temperature source Oral, resp. rate (!) 22, height 5\' 2"  (1.575 m), weight 76.7 kg, SpO2 94 %.   General Examination:                                                                                                       Physical Exam  HEENT-  Chemotherapy-induced alopecia is noted.    Lungs- Respirations unlabored Extremities- No edema  Neurological Examination Mental Status: Florid delirium noted, with disorientation x 5 (thinks she is at a relative's home), agitation, waxing and waning LOC, waxing/waning dysarthria. Speech output is fluent but answers are unrelated to questions asked or at best tangential. Follows most simple motor commands. Akathisia and  chorea noted. Hypersensitive to mild noxious stimuli.  Cranial Nerves: II: Will fixate on and then track examiners face briefly. PERRL. Unreliable blink to threat bilaterally, with least consistent responses on the right.   III,IV, VI: No ptosis. Will track to left and right with conjugate EOM. No nystagmus.   V,VII: Face symmetric. Reacts to tactile stimulation bilaterally  VIII: hearing intact to voice IX,X: No hypophonia XI: Head rotates to left and right while agitated without asymmetry XII: midline tongue extension Motor: In the context of thrashing x 4 with akathisia: Right : Upper extremity   5/5    Left:     Upper extremity   5/5  Lower extremity   5/5     Lower extremity   5/5 Choreiform movements of all 4 extremities noted.  Occasional myoclonic-like jerks versus asterixis noted.  Sensory: Reacts to noxious stimuli x 4 without asymmetry.  Deep Tendon Reflexes: Due to increased tone in the context of almost constant movement, unable to elicit reflexes.  Plantars: Right: downgoing   Left: downgoing Cerebellar/Gait: Unable to assess.    Lab Results: Basic Metabolic Panel: Recent Labs  Lab 07/24/19 1144 07/26/19 1346  07/27/19 0810 07/28/19 0431  NA 131* 127* 132* 132*  K 5.9* 5.1 4.2 3.8  CL 93* 96* 103 102  CO2 18* 17* 19* 19*  GLUCOSE 106* 111* 82 63*  BUN 25 36* 33* 21  CREATININE 1.39* 1.80* 1.52* 1.13*  CALCIUM 10.4* 10.7* 8.9 8.3*    CBC: Recent Labs  Lab 07/24/19 1144 07/26/19 1346 07/27/19 0810 07/27/19 1211 07/28/19 0431  WBC 16.4* 14.6* 15.9* 15.2* 15.7*  NEUTROABS  --  11.1*  --   --   --   HGB 10.1* 9.1* 6.5* 6.3* 10.0*  HCT 31.4* 26.8* 19.8* 18.9* 29.7*  MCV 99* 95.4 98.0 96.4 92.8  PLT 300 253 229 205 210    Cardiac Enzymes: Recent Labs  Lab 07/27/19 1211  CKTOTAL 75    Lipid Panel: No results for input(s): CHOL, TRIG, HDL, CHOLHDL, VLDL, LDLCALC in the last 168 hours.  Imaging: Ct Head Wo Contrast  Result Date: 07/27/2019 CLINICAL DATA:  Encephalopathy. EXAM: CT HEAD WITHOUT CONTRAST TECHNIQUE: Contiguous axial images were obtained from the base of the skull through the vertex without intravenous contrast. COMPARISON:  Head CT dated 07/09/2019. Brain MRI dated 07/06/2019. FINDINGS: Brain: Ventricles are stable in size and configuration. Generalized mild age related parenchymal volume loss with commensurate dilatation of the sulci. Chronic small vessel ischemic changes again noted within the bilateral periventricular and subcortical white matter regions bilaterally. No mass, hemorrhage, edema or other evidence of acute parenchymal abnormality identified. No extra-axial hemorrhage. Vascular: Chronic calcified atherosclerotic changes of the large vessels at the skull base. No unexpected hyperdense vessel. Skull: Normal. Negative for fracture or focal lesion. Sinuses/Orbits: No acute finding. Other: None. IMPRESSION: 1. No acute findings. No intracranial mass, hemorrhage or edema. No correlate seen on today's head CT for the findings suggesting posterior reversible encephalopathy syndrome (PRES) on brain MRI of 07/06/2019. 2. Chronic small vessel ischemic changes within the  white matter. Electronically Signed   By: Franki Cabot M.D.   On: 07/27/2019 11:17    Assessment: 72 year old female with gradually worsening delirium since onset of AMS on Tuesday. Now also with chorea and akathisia. She has metastatic ovarian CA. Recently was managed for PRES in the setting of severe HTN.  1. Exam reveals chorea, akathisia, agitation and waxing and waning level of consciousness with overall  picture most consistent with delirium in the setting of a probable autoimmune encephalopathy. High on the DDx is a paraneoplastic syndrome secondary to her metastatic ovarian CA. Recurrence of PRES is possible. Unlikely to be an infectious encephalitis/meningitis given no neck stiffness and chorea would not be typical; however, WBC is elevated at 15.7.   2. LP unlikely to change management as would continue to treat empirically if LP was negative for autoantibodies. If an LP is considered tomorrow AM, then it would need to be performed under fluoroscopy with anesthesia given her delirium with chorea and unremitting erratic movements which would make a bedside LP highly unlikely to be successful, with a high risk of complication.  3. She has not slept in 48 hours due to agitation/delirium.  4. Elevated TSH. Unlikely that hypothyroidism would present with an agitated delirium. More likely would present as a hypoactive delirium.    Recommendations: 1. Seroquel 100 mg po crushed in applesauce for sleep and agitation.  2. Start IV Solumedrol 1000 mg qd x 5 days (ordered).  3. Repeat MRI brain to assess for possible recurrence of PRES (orderred).  4. Frequent neuro checks 5. Telemetry 6. EEG in AM (ordered) 7. Evaluation and management of elevated TSH as per primary team.     Electronically signed: Dr. Kerney Elbe 07/28/2019, 11:14 PM

## 2019-07-28 NOTE — Progress Notes (Signed)
When I walked into the room the patient was jerking in the bed and then she stopped and looked at her daughter and stated, "Where is Rhonda Hale at?" Patients daughter asked who Rhonda Hale was and patient replied, "That girl!" Patients daughter is concerned that the patient is having a seizure. MD notified. MD stated that she would be up to see the patient. Patient is alert, but not oriented.

## 2019-07-28 NOTE — Progress Notes (Signed)
PT Cancellation Note  Patient Details Name: Rhonda Hale MRN: [id number] DOB: 11-26-1946   Cancelled Treatment:    Reason Eval/Treat Not Completed: Patient at procedure or test/unavailable - at MRI currently, will follow up as able after results are available.     [facility] 07/28/2019, 1:14 PM

## 2019-07-28 NOTE — Progress Notes (Signed)
Paged MD back to inform her that patient was more confused and not following commands after the administration of Ativan. MD aware. Will continue to monitor.

## 2019-07-28 NOTE — Progress Notes (Signed)
Hypoglycemic Event    07/28/19 @ 0557  CBG:  60  Treatment:  Snack  Symptoms:  None  Follow-up CBG:  81  Possible Reasons for Event:  Poor po intake

## 2019-07-28 NOTE — Progress Notes (Addendum)
Paged this evening regarding request from daughter, at bedside, who would like to speak to someone from our team. Patient assessed at bedside and history obtained from daughter. Daughter notes that about 2 days following discharge from the hospital on 8/5, patient developed progressive tremulous like activity that progressed to the point of patient being too scared to stand. Additionally, patient's po intact has substantially decreased despite recent initiation of megace. Patient has had a non focal constant tremor with occassions of 20-30 sec episodes of severe shaking of all limbs. Daughter notes that patient has remained conscious during these episodes and is able to answer questions. Patient has also expressed to the daughter that she feels like she is falling during those episodes. Daughter notes patient has also been having auditory and visual hallucinations over the past week or so. Patient was supposed to have an MRI today however was unable to lay still for this. 1mg  Ativan was given in an attempt to capture MRI however this appeared to worsen pt symptoms. Dr. Maricela Bo and myself assess patient at bedside. Patient exhibited shaking throughout our time in the room. Patient was awake, alert and oriented to person, time and current president however was unsure where she was. CN II-XII were intact. Sensation intact. Strength of upper and lower extremities were equal and intact. Patient revealed deficits in cerebellar function tests including finger to nose and heel slide. While in the room, patient exhibited one of these elevated shaking episodes that appeared seizure like however patient was able to answer our questions during it.  Plan: Due to the acute change in neurological status, neurology consult was placed and they agreed to evaluate patient tonight. We greatly appreciate their input. Recs pending.  ADDENDUM Would recommend adding MRA to MRI brain to evaluate for possible spinal cord injury vs  spinal cord infarction as a cause of clonus.

## 2019-07-29 ENCOUNTER — Inpatient Hospital Stay (HOSPITAL_COMMUNITY): Payer: Medicare Other

## 2019-07-29 LAB — URINE CULTURE: Culture: 80000 — AB

## 2019-07-29 LAB — BASIC METABOLIC PANEL
Anion gap: 12 (ref 5–15)
BUN: 17 mg/dL (ref 8–23)
CO2: 19 mmol/L — ABNORMAL LOW (ref 22–32)
Calcium: 8.6 mg/dL — ABNORMAL LOW (ref 8.9–10.3)
Chloride: 101 mmol/L (ref 98–111)
Creatinine, Ser: 1.11 mg/dL — ABNORMAL HIGH (ref 0.44–1.00)
GFR calc Af Amer: 56 mL/min — ABNORMAL LOW (ref >60)
GFR calc non Af Amer: 49 mL/min — ABNORMAL LOW (ref >60)
Glucose, Bld: 58 mg/dL — ABNORMAL LOW (ref 70–99)
Potassium: 3.9 mmol/L (ref 3.5–5.1)
Sodium: 132 mmol/L — ABNORMAL LOW (ref 135–145)

## 2019-07-29 LAB — CBC
HCT: 31.3 % — ABNORMAL LOW (ref 36.0–46.0)
Hemoglobin: 10.7 g/dL — ABNORMAL LOW (ref 12.0–15.0)
MCH: 31.4 pg (ref 26.0–34.0)
MCHC: 34.2 g/dL (ref 30.0–36.0)
MCV: 91.8 fL (ref 80.0–100.0)
Platelets: 227 10*3/uL (ref 150–400)
RBC: 3.41 MIL/uL — ABNORMAL LOW (ref 3.87–5.11)
RDW: 19.9 % — ABNORMAL HIGH (ref 11.5–15.5)
WBC: 17 10*3/uL — ABNORMAL HIGH (ref 4.0–10.5)
nRBC: 2 % — ABNORMAL HIGH (ref 0.0–0.2)

## 2019-07-29 LAB — GLUCOSE, CAPILLARY
Glucose-Capillary: 172 mg/dL — ABNORMAL HIGH (ref 70–99)
Glucose-Capillary: 50 mg/dL — ABNORMAL LOW (ref 70–99)

## 2019-07-29 LAB — T4, FREE: Free T4: 0.59 ng/dL — ABNORMAL LOW (ref 0.61–1.12)

## 2019-07-29 MED ORDER — DEXTROSE 50 % IV SOLN
INTRAVENOUS | Status: AC
  Administered 2019-07-29: 25 g via INTRAVENOUS
  Filled 2019-07-29: qty 50

## 2019-07-29 MED ORDER — DEXTROSE 5 % IV SOLN
INTRAVENOUS | Status: DC
  Administered 2019-07-29 – 2019-07-31 (×5): via INTRAVENOUS

## 2019-07-29 MED ORDER — DEXTROSE 50 % IV SOLN
25.0000 g | INTRAVENOUS | Status: AC
  Administered 2019-07-29: 06:00:00 25 g via INTRAVENOUS

## 2019-07-29 NOTE — Progress Notes (Signed)
  Date: 07/29/2019  Patient name: Rhonda Hale  Medical record number: [id number]  Date of birth: October 15, 1946   I have seen and evaluated this patient and I have discussed the plan of care with Dr Gilford Rile. Please see their note for complete details. I concur with their findings with the following additions/corrections: We will work on getting a sedated MRI.  Appreciate neurology follow-up and recommendations in this complex patient.  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  Lucious Groves, DO 07/29/2019, 3:30 PM

## 2019-07-29 NOTE — Progress Notes (Addendum)
Occupational Therapy Treatment Patient Details Name: Rhonda Hale MRN: [id number] DOB: 1946-04-16 Today's Date: 07/29/2019    History of present illness 73 y.o. yo female w/ PMH significant for metastatic ovarian cancer and macrocytic anemia found to have PRES syndrome on last hospital admission presenting to ED from home for worsening confusion and abnormal lab result.   OT comments  Returned for second visit to provide adaptive weighted utensils and cup for increasing participation and performance of self feeding. Pt continues to present with lethargy and fatigue, requiring Total A for self feeding due to decreased arousal and attention. Pt's daughter very receptive of education. Will continue to follow acutely and continue to recommend dc to CIR.    Follow Up Recommendations  CIR;Supervision/Assistance - 24 hour    Equipment Recommendations  Other (comment)(Defer to next venue)    Recommendations for Other Services PT consult    Precautions / Restrictions Precautions Precautions: Fall       Mobility Bed Mobility Overal bed mobility: Needs Assistance Bed Mobility: Supine to Sit;Sit to Supine     Supine to sit: +2 for physical assistance;Total assist Sit to supine: +2 for physical assistance;Total assist   General bed mobility comments: Optimized position in bed for self feeding  Transfers                 General transfer comment: Defer due to safety    Balance Overall balance assessment: Needs assistance   Sitting balance-Leahy Scale: Zero(approaching Poor) Sitting balance - Comments: able to tolerate sitting EOB for ~5-8 min; brief participation in ADL while sitting up                                   ADL either performed or assessed with clinical judgement   ADL Overall ADL's : Needs assistance/impaired Eating/Feeding: Total assistance;With adaptive utensils                                     General ADL Comments:  Returned for second visit to provide adaptive utensils     Vision       Perception     Praxis      Cognition Arousal/Alertness: Lethargic;Suspect due to medications Behavior During Therapy: [facility] for tasks assessed/performed Overall Cognitive Status: Difficult to assess                                 General Comments: Pt opening her eyes with increased cues and stimuli. Pt with decreased attention due to lethargy and difficulty sustaining attention to self feeding. Pt becoming frustrated and verablizing she wants to sleep.         Exercises     Shoulder Instructions       General Comments Daughter present throughout    Pertinent Vitals/ Pain       Pain Assessment: Faces Faces Pain Scale: Hurts a little bit Pain Location: unspecified Pain Descriptors / Indicators: Grimacing Pain Intervention(s): Monitored during session;Repositioned  Home Living Family/patient expects to be discharged to:: Private residence Living Arrangements: Children Available Help at Discharge: Family;Available 24 hours/day Type of Home: House Home Access: Level entry     Home Layout: Two level;1/2 bath on main level     Bathroom Shower/Tub: Teacher, early years/pre: Standard  Home Equipment: [place] - 2 wheels;Shower seat   Additional Comments: Pt living with daughter while undergoing chemo.      Prior Functioning/Environment Level of Independence: Independent;Needs assistance  Gait / Transfers Assistance Needed: Independent, assistive device prn     Comments: Independent up till four weeks ago and has required assist for BADLs and transfers   Frequency  Min 3X/week        Progress Toward Goals  OT Goals(current goals can now be found in the care plan section)  Progress towards OT goals: Progressing toward goals  Acute Rehab OT Goals Patient Stated Goal: Pt unable to state; Daughter hopes for return to baseline OT Goal Formulation: With  patient/family Time For Goal Achievement: 08/12/19 Potential to Achieve Goals: Good ADL Goals Pt Will Perform Eating: with modified independence;sitting;with adaptive utensils Pt Will Perform Upper Body Dressing: with min guard assist;sitting Pt Will Perform Lower Body Dressing: with min assist;sit to/from stand Pt Will Transfer to Toilet: with min assist;bedside commode;stand pivot transfer Pt Will Perform Toileting - Clothing Manipulation and hygiene: sit to/from stand;with min guard assist;sitting/lateral leans Additional ADL Goal #1: Pt will perform bed mobility with Min Guard A in preparation for ADLs  Plan Discharge plan remains appropriate    Co-evaluation    PT/OT/SLP Co-Evaluation/Treatment: Yes Reason for Co-Treatment: For patient/therapist safety;To address functional/ADL transfers PT goals addressed during session: Mobility/safety with mobility OT goals addressed during session: ADL's and self-care      AM-PAC OT "6 Clicks" Daily Activity     Outcome Measure   Help from another person eating meals?: Total Help from another person taking care of personal grooming?: Total Help from another person toileting, which includes using toliet, bedpan, or urinal?: Total Help from another person bathing (including washing, rinsing, drying)?: Total Help from another person to put on and taking off regular upper body clothing?: Total Help from another person to put on and taking off regular lower body clothing?: Total 6 Click Score: 6    End of Session    OT Visit Diagnosis: Unsteadiness on feet (R26.81);Other abnormalities of gait and mobility (R26.89);Muscle weakness (generalized) (M62.81);Ataxia, unspecified (R27.0);Other symptoms and signs involving cognitive function   Activity Tolerance Patient limited by fatigue;Patient limited by lethargy   Patient Left in bed;with call bell/phone within reach;with nursing/sitter in room   Nurse Communication Mobility status         Time: 4166-0630 OT Time Calculation (min): 12 min  Charges: OT General Charges $OT Visit: 1 Visit OT Treatments $Self Care/Home Management : 8-22 mins  [place], OTR/L Acute Rehab Pager: [phone] Office: [place] 07/29/2019, 5:03 PM

## 2019-07-29 NOTE — Evaluation (Signed)
Physical Therapy Evaluation Patient Details Name: Rhonda Hale MRN: [id number] DOB: March 24, 1946 Today's Date: 07/29/2019   History of Present Illness  73 y.o. yo female w/ PMH significant for metastatic ovarian cancer and macrocytic anemia found to have PRES syndrome on last hospital admission presenting to ED from home for worsening confusion and abnormal lab result.  Clinical Impression   Pt admitted with above diagnosis. At baseline, approx 4 weeks ago, Rhonda Hale was managing ADLs independently and walking without difficulty; Today she presents to therapies with decr arousal, decr functional mobility, uncoordinated and involuntary movements of all 4 extremities; Has experienced a precipitous decline in functional independence, and hopeful that she will make good functional progress as her medical status improves; She has 24 hour help at home, and given how independent she was recently, believe a CIR stay for intensive rehab will best facilitate return to independence. Pt currently with functional limitations due to the deficits listed below (see PT Problem List). Pt will benefit from skilled PT to increase their independence and safety with mobility to allow discharge to the venue listed below.       Follow Up Recommendations CIR    Equipment Recommendations  Other (comment)(to be determined)    Recommendations for Other Services       Precautions / Restrictions Precautions Precautions: Fall      Mobility  Bed Mobility Overal bed mobility: Needs Assistance Bed Mobility: Supine to Sit;Sit to Supine     Supine to sit: +2 for physical assistance;Total assist Sit to supine: +2 for physical assistance;Total assist   General bed mobility comments: Total assist for all aspects of bed mobility  Transfers                    Ambulation/Gait                Stairs            Wheelchair Mobility    Modified Rankin (Stroke Patients Only)       Balance  Overall balance assessment: Needs assistance   Sitting balance-Leahy Scale: Zero(approaching Poor) Sitting balance - Comments: able to tolerate sitting EOB for ~5-8 min; brief participation in ADL while sitting up                                     Pertinent Vitals/Pain Pain Assessment: Faces Faces Pain Scale: Hurts a little bit Pain Location: unspecified Pain Descriptors / Indicators: Grimacing Pain Intervention(s): Monitored during session    Home Living Family/patient expects to be discharged to:: Private residence Living Arrangements: Children Available Help at Discharge: Family;Available 24 hours/day Type of Home: House Home Access: Level entry     Home Layout: Two level;1/2 bath on main level Home Equipment: Walker - 2 wheels;Shower seat Additional Comments: Pt living with daughter while undergoing chemo.    Prior Function Level of Independence: Independent;Needs assistance   Gait / Transfers Assistance Needed: Independent, assistive device prn     Comments: Independent up till four weeks ago and has required assist for BADLs and transfers     Hand Dominance        Extremity/Trunk Assessment   Upper Extremity Assessment Upper Extremity Assessment: Difficult to assess due to impaired cognition;Defer to OT evaluation    Lower Extremity Assessment Lower Extremity Assessment: Difficult to assess due to impaired cognition(Observed involuntary low-amplitude jerking movements)       Communication  Communication: Other (comment)(decr arousal)  Cognition Arousal/Alertness: Lethargic;Suspect due to medications Behavior During Therapy: [facility] for tasks assessed/performed Overall Cognitive Status: Difficult to assess                                 General Comments: [place] to answer questions; Brief eye opening when calling her name; She did participate in woping her face with a warm washcloth      General Comments      Exercises      Assessment/Plan    PT Assessment Patient needs continued PT services  PT Problem List Decreased strength;Decreased activity tolerance;Decreased balance;Decreased mobility;Decreased coordination;Decreased cognition;Decreased knowledge of use of DME;Decreased safety awareness;Decreased knowledge of precautions;Impaired tone       PT Treatment Interventions DME instruction;Gait training;Functional mobility training;Therapeutic activities;Therapeutic exercise;Balance training;Neuromuscular re-education;Cognitive remediation;Patient/family education    PT Goals (Current goals can be found in the Care Plan section)  Acute Rehab PT Goals Patient Stated Goal: Pt unable to state; Daughter hopes for return to baseline PT Goal Formulation: With family Time For Goal Achievement: 08/12/19 Potential to Achieve Goals: Fair    Frequency Min 3X/week   Barriers to discharge        Co-evaluation PT/OT/SLP Co-Evaluation/Treatment: Yes Reason for Co-Treatment: Complexity of the patient's impairments (multi-system involvement);For patient/therapist safety;To address functional/ADL transfers PT goals addressed during session: Mobility/safety with mobility         AM-PAC PT "6 Clicks" Mobility  Outcome Measure Help needed turning from your back to your side while in a flat bed without using bedrails?: Total Help needed moving from lying on your back to sitting on the side of a flat bed without using bedrails?: Total Help needed moving to and from a bed to a chair (including a wheelchair)?: Total Help needed standing up from a chair using your arms (e.g., wheelchair or bedside chair)?: Total Help needed to walk in hospital room?: Total Help needed climbing 3-5 steps with a railing? : Total 6 Click Score: 6    End of Session Equipment Utilized During Treatment: Gait belt Activity Tolerance: Patient tolerated treatment well Patient left: in bed;with call bell/phone within reach;with  family/visitor present Nurse Communication: Mobility status PT Visit Diagnosis: Unsteadiness on feet (R26.81);Other abnormalities of gait and mobility (R26.89);Muscle weakness (generalized) (M62.81);Other symptoms and signs involving the nervous system (R29.898)    Time: 4765-4650 PT Time Calculation (min) (ACUTE ONLY): 30 min   Charges:   PT Evaluation $PT Eval Moderate Complexity: 1 Mod          Roney [place], [place]  Acute Rehabilitation Services Pager [phone] Office [phone]   [place]ta [place] 07/29/2019, 4:19 PM

## 2019-07-29 NOTE — Progress Notes (Addendum)
Reason for consult: Choreiform movements    Subjective: Continues to have intermittent abnormal limb movements without clear trigger. Mentation waxing and waning overnight. Given seroquel. Sleeping comfortably since 8AM.    ROS: negative except above Unable to obtain due to poor mental status  Examination  Vital signs in last 24 hours: Temp:  [97.7 F (36.5 C)-99 F (37.2 C)] 99 F (37.2 C) (08/17 0900) Pulse Rate:  [85-98] 90 (08/17 0900) Resp:  [18-22] 18 (08/17 0900) BP: (115-149)/(67-92) 115/92 (08/17 0900) SpO2:  [94 %-96 %] 95 % (08/17 0900)  General: lying in bed, sleeping, tremulous  RS: breathing comfortably on room air  Extremities: normal   Neuro: MS: Opens eyes briefly to verbal stimulus, but goes back to sleep quickly, does not answer questions  CN: pupils equal and reactive, face symmetric Motor: normal tone in all 4 extremities, no rigidity noticed. Myoclonic jerks on all 4 extremities. Unable to elicit ankle clonus on exam.  Reflexes: no hyperreflexia noted  Coordination: not tested  Gait: not tested  Basic Metabolic Panel: Recent Labs  Lab 07/24/19 1144 07/26/19 1346 07/27/19 0810 07/28/19 0431 07/29/19 0306  NA 131* 127* 132* 132* 132*  K 5.9* 5.1 4.2 3.8 3.9  CL 93* 96* 103 102 101  CO2 18* 17* 19* 19* 19*  GLUCOSE 106* 111* 82 63* 58*  BUN 25 36* 33* 21 17  CREATININE 1.39* 1.80* 1.52* 1.13* 1.11*  CALCIUM 10.4* 10.7* 8.9 8.3* 8.6*    CBC: Recent Labs  Lab 07/26/19 1346 07/27/19 0810 07/27/19 1211 07/28/19 0431 07/29/19 0306  WBC 14.6* 15.9* 15.2* 15.7* 17.0*  NEUTROABS 11.1*  --   --   --   --   HGB 9.1* 6.5* 6.3* 10.0* 10.7*  HCT 26.8* 19.8* 18.9* 29.7* 31.3*  MCV 95.4 98.0 96.4 92.8 91.8  PLT 253 229 205 210 227     Coagulation Studies: No results for input(s): LABPROT, INR in the last 72 hours.  Imaging Reviewed:  Head CT: No acute intracranial pathology.  No findings suggestive MRI brain pending, will follow     ASSESSMENT AND PLAN  Rhonda Hale is a 73 yo female with history of metastatic ovarian cancer in remission who was recently admitted for AMS and treated for PRES thought to be secondary to chemotherapy-induced severe HTN  (on bevacizumab). Her hospital course was complicated by pneumonia treated as PJP and ischemic ATN. She presented 2 days ago with hyperkalemia in the setting of AKI and was noted to have abnormal limb movements consistent with chorea/akathisia overnight. She has also been experiencing visual and auditory hallucinations since being discharged home, possibly steroid-induced.   Impression  Myoclonus Acute toxic/metabolic encephalopathy -  unclear etiology: paraneoplastic syndrome on the differential given history of metastatic ovarian cancer, steroid-induced. No signs on exam suggestive of serotonin syndrome.    - Follow up MRI brain to check for recurrence of PRES/to assess for limbic encephalitis .- Follow up EEG  - Continue IV Solumedrol 1000 mg QD x 5 days  - Serum paraneoplastic syndrome panel. If CSF sample remains in lab, could order CSF paraneoplastic panel  - FT4 given elevated TSH  - Delirium precautions  - Seroquel 25 QHS for sleep and agitation    Rhonda Roche, MD  Internal Medicine PGY-3  P [phone]   NEUROHOSPITALIST ADDENDUM Performed a face to face diagnostic evaluation.   I have reviewed the contents of history and physical exam as documented by PA/ARNP/Resident and agree with above documentation.  I have  discussed and formulated the above plan as documented. Edits to the note have been made as needed.  Patient sleeping comfortably.  She was easily aroused on exam and demonstrated occasional myoclonic jerks in all 4 extremities.  No nystagmus noted on exam.  She did not have significant choreiform movements, however she was awake only briefly before going back to sleep. Tone was normal, reflexes were diminished.  No clonus was  appreciated.   While steroids could certainly explain encephalopathy and delirium, constellation of myoclonus and possible choreiform movements does raise suspicion for paraneoplastic syndrome often seen in ovarian cancer.  She was started on high-dose steroids for this.  We will reassess in a few days to see if there is any improvement.  We will also order serum paraneoplastic panel.    Rhonda Addison Marshay Slates MD [facility] [phone]   If 7pm to 7am, please call on call as listed on AMION.

## 2019-07-29 NOTE — Progress Notes (Signed)
Patient's lab blood glucose results were low again this morning. I obtained a CBG and it was 50. Pt is sleeping and unable to take po at this time. MD notified. Dextrose 50% 25g IV was given. Will repeat CBG in 79min.

## 2019-07-29 NOTE — Progress Notes (Signed)
Rehab Admissions Coordinator Note:  Patient was screened by Cleatrice Burke for appropriateness for an Inpatient Acute Rehab Consult per PT and OT recs.   At this time, we are recommending Inpatient Rehab consult. Please place order for consult.  Cleatrice Burke RN MSN 07/29/2019, 6:11 PM  I can be reached at [phone].

## 2019-07-29 NOTE — Plan of Care (Signed)
  Problem: Education: Goal: Knowledge of General Education information will improve Description: Including pain rating scale, medication(s)/side effects and non-pharmacologic comfort measures Outcome: Progressing   Problem: Activity: Goal: Risk for activity intolerance will decrease Outcome: Progressing   Problem: Nutrition: Goal: Adequate nutrition will be maintained Outcome: Progressing   

## 2019-07-29 NOTE — Progress Notes (Addendum)
   Subjective:  Rhonda Hale was seen at bedside with her daughter. Rhonda Hale was asleep, possibly 2/2 to her quetiapine, and the daughter asked for [place] to not wake her. She states her mother becomes severely agitated when she has been awoken this morning. Rhonda Hale had an eventful night, where she continued to have violent jerking movements. Our night team saw her and Neurology was consulted. Her daughter, and our team, appreciate Neurology's help in Rhonda Hale's care. She states that when her mother does wake up, she states that "Something is wrong with me, please fix me" before becoming altered. We spoke about Rhonda Hale failing the MRI yesterday. The daughter states she spoke with her sister about the MRI, and they are in agreement about allowing her to be sedated for another MRI and LP. All questions and concerns were addressed.   Objective:  Vital signs in last 24 hours: Vitals:   07/28/19 0921 07/28/19 1639 07/28/19 2018 07/28/19 2101  BP: 130/62 131/73 (!) 145/72 (!) 149/67  Pulse: 70 85 98 97  Resp: 18 18 20  (!) 22  Temp: 97.8 F (36.6 C) 97.7 F (36.5 C) 98.4 F (36.9 C) 98.9 F (37.2 C)  TempSrc: Oral Oral Axillary Oral  SpO2: 94% 94% 95% 94%  Weight:      Height:       Physical Exam deferred at daughter's request.  Assessment/Plan:  Principal Problem:   Encephalopathy Active Problems:   AKI (acute kidney injury) ([place])   Dehydration   AMS (altered mental status) Rhonda Hale is a 73 y/o female that presents to [facility] with an AKI, AMS, and Hyperkalemia.   New Onset Tremors:  - Ordered MRI brain w w/o contrast failed x2 due to tremors  - Ordered PT/OT eval and treat - B12: 568 - TSH: 6.42 - Neurology consulted, we appreciate their help with Rhonda Hale's care.   - High on the DDx is paraneoplastic syndrome, per the consult  - 1,000 mg QD x5 days.   - EEG ordered.   Altered Mental Status:  - Hold home Prednisone in the case AMS is 2/2 to steroid psychosis. - Blood  cultures: NGTD 2 days - Seroquel 100 mg for agitation  New Onset Anemia:  - Hgb: 10.7 - Follow up CBC  Metastatic Ovarian Cancer:  - Continue Megace 40 mg BID  Hypothyroidism:  - Continue Synthroid 137 mg QD. - Ordered free T4.  AKI 2/2 to Dehydration: - Creatinine: 1.11 - GI pathogen panel: Negative - Continue IVF - Continue I/O Monitoring  - Continue to avoid nephrotoxic drugs    Hyperkalemia: Resolved  Dispo: Anticipated discharge pending medical course.   Rhonda Mercury, MD 07/29/2019, 6:54 AM Pager: [phone]

## 2019-07-29 NOTE — Progress Notes (Signed)
Attempted MRI of brain, patient was uncooperative with scan.  We let her fall asleep before beginning, but every time the noises started she would begin thrashing her arms, legs and head. We calmed her down and attempted again with an MRI staff member standing by her in scan room to try to calm her, but each time we restarted the noises she immediately began thrashing.  Scan discontinued, scout images were completely compromised for motion and patient could've hurt herself with the thrashing. RN informed, this was the second attempt to complete this MRI

## 2019-07-29 NOTE — Evaluation (Addendum)
Occupational Therapy Evaluation Patient Details Name: Rhonda Hale MRN: [id number] DOB: 1946-03-18 Today's Date: 07/29/2019    History of Present Illness 73 y.o. yo female w/ PMH significant for metastatic ovarian cancer and macrocytic anemia found to have PRES syndrome on last hospital admission presenting to ED from home for worsening confusion and abnormal lab result.   Clinical Impression   PTA, pt was living with her daughter and was independent up till four weeks ago when tremors increased; since four weeks pt's family has been assisting with ADLs and transfers. Pt currently requiring Total A for Bed mobility and ADLs. Pt presenting with lethargy, poor balance, and uncoordinated and involuntary movements of all four extremities. Due to prior independence and good family support, recommend dc to CIR for intensive OT to optimize return to PLOF, increase safety, and decrease caregiver burden. Will continue to follow acutely as admitted.     Follow Up Recommendations  CIR;Supervision/Assistance - 24 hour    Equipment Recommendations  Other (comment)(Defer to next venue)    Recommendations for Other Services PT consult     Precautions / Restrictions Precautions Precautions: Fall      Mobility Bed Mobility Overal bed mobility: Needs Assistance Bed Mobility: Supine to Sit;Sit to Supine     Supine to sit: +2 for physical assistance;Total assist Sit to supine: +2 for physical assistance;Total assist   General bed mobility comments: Total assist for all aspects of bed mobility  Transfers                 General transfer comment: Defer due to safety    Balance Overall balance assessment: Needs assistance   Sitting balance-Leahy Scale: Zero(approaching Poor) Sitting balance - Comments: able to tolerate sitting EOB for ~5-8 min; brief participation in ADL while sitting up                                   ADL either performed or assessed with clinical  judgement   ADL Overall ADL's : Needs assistance/impaired                                       General ADL Comments: Total A due to poor arousal and cognition as well as jerky movements     Vision         Perception     Praxis      Pertinent Vitals/Pain Pain Assessment: Faces Faces Pain Scale: Hurts a little bit Pain Location: unspecified Pain Descriptors / Indicators: Grimacing Pain Intervention(s): Monitored during session;Limited activity within patient's tolerance;Repositioned     Hand Dominance Right   Extremity/Trunk Assessment Upper Extremity Assessment Upper Extremity Assessment: Difficult to assess due to impaired cognition(Jerky movements)   Lower Extremity Assessment Lower Extremity Assessment: Difficult to assess due to impaired cognition   Cervical / Trunk Assessment Cervical / Trunk Assessment: Other exceptions Cervical / Trunk Exceptions: Poor trunk control whiel seated at EOB   Communication Communication Communication: Other (comment)(decr arousal)   Cognition Arousal/Alertness: Lethargic;Suspect due to medications Behavior During Therapy: [facility] for tasks assessed/performed Overall Cognitive Status: Difficult to assess                                 General Comments: Unable to answer questions; Brief eye opening when calling  her name; She did participate in woping her face with a warm washcloth   General Comments  Daughter present throughout    Exercises     Shoulder Instructions      Home Living Family/patient expects to be discharged to:: Private residence Living Arrangements: Children Available Help at Discharge: Family;Available 24 hours/day Type of Home: House Home Access: Level entry     Home Layout: Two level;1/2 bath on main level     Bathroom Shower/Tub: Teacher, early years/pre: Standard     Home Equipment: Environmental consultant - 2 wheels;Shower seat   Additional Comments: Pt living with  daughter while undergoing chemo.      Prior Functioning/Environment Level of Independence: Independent;Needs assistance  Gait / Transfers Assistance Needed: Independent, assistive device prn     Comments: Independent up till four weeks ago and has required assist for BADLs and transfers        OT Problem List: Decreased strength;Decreased range of motion;Impaired balance (sitting and/or standing);Decreased activity tolerance;Decreased coordination;Decreased cognition;Decreased safety awareness;Decreased knowledge of use of DME or AE;Decreased knowledge of precautions      OT Treatment/Interventions: Self-care/ADL training;Therapeutic exercise;Energy conservation;DME and/or AE instruction;Therapeutic activities;Patient/family education    OT Goals(Current goals can be found in the care plan section) Acute Rehab OT Goals Patient Stated Goal: Pt unable to state; Daughter hopes for return to baseline OT Goal Formulation: With patient/family Time For Goal Achievement: 08/12/19 Potential to Achieve Goals: Good  OT Frequency: Min 3X/week   Barriers to D/C:            Co-evaluation PT/OT/SLP Co-Evaluation/Treatment: Yes Reason for Co-Treatment: For patient/therapist safety;To address functional/ADL transfers PT goals addressed during session: Mobility/safety with mobility OT goals addressed during session: ADL's and self-care      AM-PAC OT "6 Clicks" Daily Activity     Outcome Measure Help from another person eating meals?: Total Help from another person taking care of personal grooming?: Total Help from another person toileting, which includes using toliet, bedpan, or urinal?: Total Help from another person bathing (including washing, rinsing, drying)?: Total Help from another person to put on and taking off regular upper body clothing?: Total Help from another person to put on and taking off regular lower body clothing?: Total 6 Click Score: 6   End of Session Nurse  Communication: Mobility status  Activity Tolerance: Patient limited by fatigue;Patient limited by lethargy Patient left: in bed;with call bell/phone within reach;with nursing/sitter in room  OT Visit Diagnosis: Unsteadiness on feet (R26.81);Other abnormalities of gait and mobility (R26.89);Muscle weakness (generalized) (M62.81);Ataxia, unspecified (R27.0);Other symptoms and signs involving cognitive function                Time: 1204-1232 OT Time Calculation (min): 23 min Charges:  OT General Charges $OT Visit: 1 Visit OT Evaluation $OT Eval Moderate Complexity: [place], OTR/L Acute Rehab Pager: [phone] Office: [place] 07/29/2019, 4:54 PM

## 2019-07-30 ENCOUNTER — Inpatient Hospital Stay (HOSPITAL_COMMUNITY): Payer: Medicare Other

## 2019-07-30 ENCOUNTER — Inpatient Hospital Stay (HOSPITAL_COMMUNITY)
Admit: 2019-07-30 | Discharge: 2019-07-30 | Disposition: A | Discharge status: Home / Self Care | Payer: Medicare Other | Attending: Neurology | Admitting: Neurology

## 2019-07-30 MED ORDER — RIVAROXABAN 20 MG PO TABS
20.0000 mg | ORAL_TABLET | Freq: Every day | ORAL | Status: DC
  Administered 2019-07-30: 20 mg via ORAL
  Filled 2019-07-30: qty 1

## 2019-07-30 NOTE — Progress Notes (Signed)
EEG Completed; Results Pending  

## 2019-07-30 NOTE — Progress Notes (Addendum)
Reason for consult: Choreiform movements  Subjective: No acute events overnight.  Slept well with Seroquel.  Doing very well this morning.  She is awake, alert, and answering questions appropriately.  Continues to have mild myoclonic jerks on bilateral upper extremities, none noted on lower extremities. Endorses some confusion at times, but overall feeling much better.    ROS: negative except above  Examination  Vital signs in last 24 hours: Temp:  [98.2 F (36.8 C)-98.9 F (37.2 C)] 98.2 F (36.8 C) (08/18 0926) Pulse Rate:  [80-100] 80 (08/18 0926) Resp:  [16-18] 18 (08/18 0926) BP: (109-119)/(58-61) 109/60 (08/18 0926) SpO2:  [95 %-97 %] 97 % (08/18 0926)  General: Sitting up in bed talking to daughter in no acute distress CVS: pulse-normal rate and rhythm RS: breathing comfortably on RA  Extremities: normal   Neuro: MS: Alert, oriented to self and place, able to follows commands CN: pupils equal and reactive,  EOMI, face symmetric, tongue midline, normal sensation over face Motor: 5/5 strength in all 4 extremities. Mild myoclonic jerks on bilateral hands that is not present at rest, none noted on LE which is improved from yesterday. No ankle clonus.  Reflexes: 2+ bilaterally over patella, biceps  Coordination: mild dysmetria bilaterally from tremors  Gait: not tested  Basic Metabolic Panel: Recent Labs  Lab 07/24/19 1144 07/26/19 1346 07/27/19 0810 07/28/19 0431 07/29/19 0306  NA 131* 127* 132* 132* 132*  K 5.9* 5.1 4.2 3.8 3.9  CL 93* 96* 103 102 101  CO2 18* 17* 19* 19* 19*  GLUCOSE 106* 111* 82 63* 58*  BUN 25 36* 33* 21 17  CREATININE 1.39* 1.80* 1.52* 1.13* 1.11*  CALCIUM 10.4* 10.7* 8.9 8.3* 8.6*    CBC: Recent Labs  Lab 07/26/19 1346 07/27/19 0810 07/27/19 1211 07/28/19 0431 07/29/19 0306  WBC 14.6* 15.9* 15.2* 15.7* 17.0*  NEUTROABS 11.1*  --   --   --   --   HGB 9.1* 6.5* 6.3* 10.0* 10.7*  HCT 26.8* 19.8* 18.9* 29.7* 31.3*  MCV 95.4 98.0 96.4  92.8 91.8  PLT 253 229 205 210 227     Coagulation Studies: No results for input(s): LABPROT, INR in the last 72 hours.  Imaging Reviewed:  No new imaging to review at this time. Has MRI brain ordered. Will follow.   ASSESSMENT AND PLAN  Ms. Moscoso is a 73 yo female with history of metastatic ovarian cancer in remission who was recently admitted for AMS and treated for PRES thought to be secondary to chemotherapy-induced severe HTN  (on bevacizumab). Her hospital course was complicated by pneumonia treated as PJP and ischemic ATN. Admitted for AKI and hyperkalemia and noted to have AMS, visual hallucinations and choreiform movements.   Impression Acute toxic/metabolic encephalopathy  Myoclonus   Etiology remains unclear with paraneoplastic syndrome high in the differential due to history of metastatic ovarian cancer. She has been on high dose IV steroids for 2 days with remarkable improvement in mental status and myoclonic movements. We recommend stopping IV steroids and close monitoring of mental status and abnormal limb movements off steroids pending workup as below.    - Stop IV Solumedrol  - Follow up brain MRI  - EEG and serum paraneoplastic panel ordered  - Delirium precautions    Final recommendations per neurology attending Dr. Lorraine Lax.   Welford Roche, MD  Internal Medicine PGY-3  P [phone]  NEUROHOSPITALIST ADDENDUM Performed a face to face diagnostic evaluation.   I have reviewed the contents of  history and physical exam as documented by PA/ARNP/Resident and agree with above documentation.  I have discussed and formulated the above plan as documented. Edits to the note have been made as needed.   Patient has improved remarkably just after 2 doses of high-dose Solu-Medrol.  This would be fairly unusual to have such rapid improvement in suspect that encephalopathy may have been related to delirium that improved after receiving Seroquel.  After discussion  with daughter, we decided to discontinue IV steroids.  We will wait and see if patient continues to clinically improve.  Serum paraneoplastic panel has been ordered and is pending with results not expected come back a least in the next couple of weeks.  If she continues to improve, I would continue to manage her conservatively and paraneoplastic syndrome would less likely be a cause for her presentation.  However patient starts to decline, then I would recommend we re-initiate high-dose steroids.    Karena Addison Lotoya Casella MD [facility] [phone]   If 7pm to 7am, please call on call as listed on AMION.

## 2019-07-30 NOTE — Progress Notes (Signed)
Pt at MRI at this time - will attempt later as schedule permits.

## 2019-07-30 NOTE — Procedures (Addendum)
Patient Name: Rhonda Hale  MRN: [id number]  Epilepsy Attending: Lora Havens  Referring Physician/Provider: Dr Karena Addison Aroor Date: 07/30/2019 Duration: 23.48 minutes  Patient history: 73 year old female with altered mental status.  EEG to evaluate for seizures.  Level of alertness: awake, lethargic  AEDs during EEG study: None  Technical aspects: This EEG study was done with scalp electrodes positioned according to the 10-20 International system of electrode placement. Electrical activity was acquired at a sampling rate of 500Hz  and reviewed with a high frequency filter of 70Hz  and a low frequency filter of 1Hz . EEG data were recorded continuously and digitally stored.   Description:  The posterior dominant rhythm consists of 7-8 Hz activity of moderate voltage (25-35 uV) seen predominantly in posterior head regions, symmetric and reactive to eye opening and eye closing.         Drowsiness was characterized by attenuation of the posterior background rhythm.  There was also 3 to 5 Hz theta-delta slowing in the left more than right temporal region.  Hyperventilation and photic stimulation were not performed.  IMPRESSION: This study is suggestive of cortical dysfunction in the left more than right temporal region which is nonspecific to etiology. No seizures or epileptiform discharges were seen throughout the recording.    Shakia Sebastiano Barbra Sarks

## 2019-07-30 NOTE — Plan of Care (Signed)
  Problem: Activity: Goal: Risk for activity intolerance will decrease Outcome: Progressing   Problem: Nutrition: Goal: Adequate nutrition will be maintained Outcome: Progressing   

## 2019-07-30 NOTE — Progress Notes (Signed)
   Subjective:  Ms. Lippman was seen at bedside with her daughter. She is awake and alert today. She states that she is feeling better and that her shaking spells have quieted down. Her daughter states that her mother is intermittently confused, asking for milk from the "home fridge" and speaking to others that are not present still, but overall she has improved. All questions and concerns were addressed.     Objective:  Vital signs in last 24 hours: Vitals:   07/29/19 0802 07/29/19 0900 07/29/19 1637 07/29/19 2130  BP:  (!) 115/92 (!) 119/58 119/61  Pulse:  90 100 93  Resp:  18 18 16   Temp:  99 F (37.2 C) 98.9 F (37.2 C) 98.5 F (36.9 C)  TempSrc:  Oral Axillary Oral  SpO2: 96% 95% 96% 96%  Weight:      Height:       Physical Exam deferred at daughter's request.  Assessment/Plan:  Principal Problem:   Encephalopathy Active Problems:   AKI (acute kidney injury) ([place])   Dehydration   AMS (altered mental status) Maddyson Keil is a 73 y/o female that presents to [facility] with an AKI, AMS, and Hyperkalemia.   New Onset Tremors:  - Ordered MRI brain w w/o contrast failed x2 due to tremors  - Ordered PT/OT eval and treat - B12: 568 - TSH: 6.42 - Neurology consulted, we appreciate their help with Ms. Rico's care.   - Discontinue Solumedrol IV, per Neurology recommendation.   - EEG ordered.  - Serum paraneoplastic panel ordered.   Altered Mental Status:  - Hold home Prednisone in the case AMS is 2/2 to steroid psychosis. - Blood cultures: NGTD 4 days. - Seroquel 100 mg for agitation. - Delirium precautions.  New Onset Anemia:  - Hgb: 10.7 - Follow up CBC  Metastatic Ovarian Cancer:  - Continue Megace 40 mg BID  Hypothyroidism:  - Continue Synthroid 137 mg QD. - Ordered free T4.  AKI 2/2 to Dehydration: - Creatinine: 1.11 (07/29/2019) - GI pathogen panel: Negative - Continue IVF - Continue I/O Monitoring  - Continue to avoid nephrotoxic drugs    Hyperkalemia:  Resolved  Dispo: Anticipated discharge pending medical course.   Maudie Mercury, MD 07/30/2019, 6:50 AM Pager: [phone]

## 2019-07-30 NOTE — Discharge Instructions (Signed)
Information on my medicine - XARELTO (Rivaroxaban)  This medication education was reviewed with me or my healthcare representative as part of my discharge preparation.    What do you need to know about xarelto ? Take your Xarelto ONCE DAILY at the same time every day with your evening meal. If you have difficulty swallowing the tablet whole, you may crush it and mix in applesauce just prior to taking your dose.  Take Xarelto exactly as prescribed by your doctor and DO NOT stop taking Xarelto without talking to the doctor who prescribed the medication.  Stopping without other stroke prevention medication to take the place of Xarelto may increase your risk of developing a clot.  Refill your prescription before you run out.  After discharge, you should have regular check-up appointments with your healthcare provider that is prescribing your Xarelto.  In the future your dose may need to be changed if your kidney function or weight changes by a significant amount.  What do you do if you miss a dose? If you are taking Xarelto ONCE DAILY and you miss a dose, take it as soon as you remember on the same day then continue your regularly scheduled once daily regimen the next day. Do not take two doses of Xarelto at the same time or on the same day.   Important Safety Information A possible side effect of Xarelto is bleeding. You should call your healthcare provider right away if you experience any of the following: ? Bleeding from an injury or your nose that does not stop. ? Unusual colored urine (red or dark brown) or unusual colored stools (red or black). ? Unusual bruising for unknown reasons. ? A serious fall or if you hit your head (even if there is no bleeding).  Some medicines may interact with Xarelto and might increase your risk of bleeding while on Xarelto. To help avoid this, consult your healthcare provider or pharmacist prior to using any new prescription or non-prescription  medications, including herbals, vitamins, non-steroidal anti-inflammatory drugs (NSAIDs) and supplements.  This website has more information on Xarelto: [email or web address]

## 2019-07-30 NOTE — Progress Notes (Signed)
Inpatient Rehab Admissions:  Inpatient Rehab Consult received.  I met with pt and her daughter at the bedside for rehabilitation assessment. From what I can gather from prior notes, pt is much more alert and participatory today than previously. Discussed that from my bed level assessment that the patient appears appropriate for CIR but I would need to see updated therapy notes to help determine estimated length of stay, goals, and anticipated level of assist at DC. Pt and her daughter would like to pursue CIR once appropriate. AC will await updated therapy notes and, if favorable for CIR, will submit for insurance approval.   Will follow up tomorrow.   Jhonnie Garner, OTR/L  Rehab Admissions Coordinator  [phone] 07/30/2019 5:16 PM

## 2019-07-30 NOTE — Progress Notes (Signed)
SLP Cancellation Note  Patient Details Name: Rhonda Hale MRN: [id number] DOB: 04/10/1946   Cancelled treatment:       Reason Eval/Treat Not Completed: Patient at procedure or test/unavailable. Orders received for BSE and SLE. Pt is currently undergoing EEG in her room. RN reports no obvious difficulty swallowing. Will continue efforts and evaluate when pt is available.  Burnie Therien B. [facility], [place] Speech Language Pathologist [phone]  Shonna Chock 07/30/2019, 2:28 PM

## 2019-07-30 NOTE — Plan of Care (Signed)
  Problem: Education: Goal: Knowledge of General Education information will improve Description: Including pain rating scale, medication(s)/side effects and non-pharmacologic comfort measures Outcome: Progressing   Problem: Nutrition: Goal: Adequate nutrition will be maintained Outcome: Progressing   

## 2019-07-30 NOTE — Progress Notes (Signed)
Internal Medicine Attending:   I saw and examined the patient. I reviewed Dr Jackson Latino note and I agree with the resident's findings and plan as documented in the resident's note with the following additions: Free T4 not likely needed given known diagnosis of Hypothyroidism however it has returned low, regardless we can increase synthroid to 164mcg daily. MRI shows resolution of press, no definitive cause identified- appreciate neurology following and assistance with this difficult case.

## 2019-07-31 ENCOUNTER — Encounter (HOSPITAL_COMMUNITY): Payer: Self-pay

## 2019-07-31 DIAGNOSIS — E871 Hypo-osmolality and hyponatremia: Secondary | ICD-10-CM | POA: Diagnosis present

## 2019-07-31 DIAGNOSIS — E872 Acidosis: Secondary | ICD-10-CM

## 2019-07-31 LAB — BASIC METABOLIC PANEL
Anion gap: 12 (ref 5–15)
Anion gap: 14 (ref 5–15)
Anion gap: 15 (ref 5–15)
Anion gap: 11 (ref 5–15)
BUN: 46 mg/dL — ABNORMAL HIGH (ref 8–23)
BUN: 50 mg/dL — ABNORMAL HIGH (ref 8–23)
BUN: 51 mg/dL — ABNORMAL HIGH (ref 8–23)
BUN: 56 mg/dL — ABNORMAL HIGH (ref 8–23)
CO2: 17 mmol/L — ABNORMAL LOW (ref 22–32)
CO2: 14 mmol/L — ABNORMAL LOW (ref 22–32)
CO2: 14 mmol/L — ABNORMAL LOW (ref 22–32)
CO2: 16 mmol/L — ABNORMAL LOW (ref 22–32)
Calcium: 7.9 mg/dL — ABNORMAL LOW (ref 8.9–10.3)
Calcium: 7.7 mg/dL — ABNORMAL LOW (ref 8.9–10.3)
Calcium: 7.9 mg/dL — ABNORMAL LOW (ref 8.9–10.3)
Calcium: 8 mg/dL — ABNORMAL LOW (ref 8.9–10.3)
Chloride: 91 mmol/L — ABNORMAL LOW (ref 98–111)
Chloride: 90 mmol/L — ABNORMAL LOW (ref 98–111)
Chloride: 90 mmol/L — ABNORMAL LOW (ref 98–111)
Chloride: 92 mmol/L — ABNORMAL LOW (ref 98–111)
Creatinine, Ser: 2.47 mg/dL — ABNORMAL HIGH (ref 0.44–1.00)
Creatinine, Ser: 2.46 mg/dL — ABNORMAL HIGH (ref 0.44–1.00)
Creatinine, Ser: 2.58 mg/dL — ABNORMAL HIGH (ref 0.44–1.00)
Creatinine, Ser: 2.64 mg/dL — ABNORMAL HIGH (ref 0.44–1.00)
GFR calc Af Amer: 21 mL/min — ABNORMAL LOW (ref >60)
GFR calc Af Amer: 21 mL/min — ABNORMAL LOW (ref >60)
GFR calc Af Amer: 20 mL/min — ABNORMAL LOW (ref >60)
GFR calc Af Amer: 20 mL/min — ABNORMAL LOW (ref >60)
GFR calc non Af Amer: 18 mL/min — ABNORMAL LOW (ref >60)
GFR calc non Af Amer: 19 mL/min — ABNORMAL LOW (ref >60)
GFR calc non Af Amer: 18 mL/min — ABNORMAL LOW (ref >60)
GFR calc non Af Amer: 17 mL/min — ABNORMAL LOW (ref >60)
Glucose, Bld: 257 mg/dL — ABNORMAL HIGH (ref 70–99)
Glucose, Bld: 274 mg/dL — ABNORMAL HIGH (ref 70–99)
Glucose, Bld: 254 mg/dL — ABNORMAL HIGH (ref 70–99)
Glucose, Bld: 253 mg/dL — ABNORMAL HIGH (ref 70–99)
Potassium: 4.3 mmol/L (ref 3.5–5.1)
Potassium: 4.1 mmol/L (ref 3.5–5.1)
Potassium: 4.2 mmol/L (ref 3.5–5.1)
Potassium: 4 mmol/L (ref 3.5–5.1)
Sodium: 120 mmol/L — ABNORMAL LOW (ref 135–145)
Sodium: 118 mmol/L — CL (ref 135–145)
Sodium: 119 mmol/L — CL (ref 135–145)
Sodium: 119 mmol/L — CL (ref 135–145)

## 2019-07-31 LAB — CBC WITH DIFFERENTIAL/PLATELET
Abs Immature Granulocytes: 0.18 10*3/uL — ABNORMAL HIGH (ref 0.00–0.07)
Basophils Absolute: 0 10*3/uL (ref 0.0–0.1)
Basophils Relative: 0 %
Eosinophils Absolute: 0 10*3/uL (ref 0.0–0.5)
Eosinophils Relative: 0 %
HCT: 25 % — ABNORMAL LOW (ref 36.0–46.0)
Hemoglobin: 8.9 g/dL — ABNORMAL LOW (ref 12.0–15.0)
Immature Granulocytes: 1 %
Lymphocytes Relative: 3 %
Lymphs Abs: 0.4 10*3/uL — ABNORMAL LOW (ref 0.7–4.0)
MCH: 31.8 pg (ref 26.0–34.0)
MCHC: 35.6 g/dL (ref 30.0–36.0)
MCV: 89.3 fL (ref 80.0–100.0)
Monocytes Absolute: 0.4 10*3/uL (ref 0.1–1.0)
Monocytes Relative: 2 %
Neutro Abs: 14.5 10*3/uL — ABNORMAL HIGH (ref 1.7–7.7)
Neutrophils Relative %: 94 %
Platelets: 222 10*3/uL (ref 150–400)
RBC: 2.8 MIL/uL — ABNORMAL LOW (ref 3.87–5.11)
RDW: 19 % — ABNORMAL HIGH (ref 11.5–15.5)
WBC: 15.5 10*3/uL — ABNORMAL HIGH (ref 4.0–10.5)
nRBC: 1.5 % — ABNORMAL HIGH (ref 0.0–0.2)

## 2019-07-31 LAB — CULTURE, BLOOD (ROUTINE X 2)
Culture: NO GROWTH
Culture: NO GROWTH
Special Requests: ADEQUATE

## 2019-07-31 LAB — CREATININE, URINE, RANDOM: Creatinine, Urine: 109.68 mg/dL

## 2019-07-31 LAB — OSMOLALITY, URINE: Osmolality, Ur: 291 mOsm/kg — ABNORMAL LOW (ref 300–900)

## 2019-07-31 LAB — OSMOLALITY: Osmolality: 272 mOsm/kg — ABNORMAL LOW (ref 275–295)

## 2019-07-31 LAB — SODIUM, URINE, RANDOM: Sodium, Ur: 29 mmol/L

## 2019-07-31 MED ORDER — SODIUM CHLORIDE 0.9 % IV SOLN
INTRAVENOUS | Status: AC
  Administered 2019-07-31: 20:00:00 via INTRAVENOUS

## 2019-07-31 MED ORDER — SODIUM BICARBONATE 650 MG PO TABS
650.0000 mg | ORAL_TABLET | Freq: Three times a day (TID) | ORAL | Status: DC
  Administered 2019-08-01 (×3): 650 mg via ORAL
  Filled 2019-07-31 (×3): qty 1

## 2019-07-31 MED ORDER — SODIUM CHLORIDE 0.9 % IV SOLN
INTRAVENOUS | Status: AC
  Administered 2019-07-31: 15:00:00 via INTRAVENOUS

## 2019-07-31 MED ORDER — QUETIAPINE FUMARATE 25 MG PO TABS
25.0000 mg | ORAL_TABLET | Freq: Every day | ORAL | Status: DC
  Administered 2019-07-31 – 2019-08-01 (×2): 25 mg via ORAL
  Filled 2019-07-31 (×2): qty 1

## 2019-07-31 MED ORDER — APIXABAN 5 MG PO TABS
5.0000 mg | ORAL_TABLET | Freq: Two times a day (BID) | ORAL | Status: DC
  Administered 2019-07-31 – 2019-08-02 (×4): 5 mg via ORAL
  Filled 2019-07-31 (×4): qty 1

## 2019-07-31 MED ORDER — SULFAMETHOXAZOLE-TRIMETHOPRIM 800-160 MG PO TABS: 2.0000 | ORAL_TABLET | Freq: Two times a day (BID) | ORAL | Status: DC

## 2019-07-31 MED ORDER — SODIUM BICARBONATE 650 MG PO TABS
1300.0000 mg | ORAL_TABLET | Freq: Once | ORAL | Status: AC
  Administered 2019-07-31: 1300 mg via ORAL
  Filled 2019-07-31: qty 2

## 2019-07-31 MED ORDER — SODIUM BICARBONATE 650 MG PO TABS: 650.0000 mg | ORAL_TABLET | Freq: Three times a day (TID) | ORAL | Status: DC

## 2019-07-31 NOTE — Progress Notes (Addendum)
Reason for consult:   Subjective: Per daughter Daleen Snook, patient was restless overnight with some visual hallucinations and confusion. She has also noticed increased jerky movements in all extremities. Patient reports she can tell when she is confused at times. Feeling hungry, otherwise feeling well this AM.    ROS: negative except above  Examination  Vital signs in last 24 hours: Temp:  [97.5 F (36.4 C)-98.2 F (36.8 C)] 97.5 F (36.4 C) (08/19 0954) Pulse Rate:  [72-96] 76 (08/19 0954) Resp:  [14-20] 18 (08/19 0954) BP: (98-116)/(46-80) 103/80 (08/19 0954) SpO2:  [94 %-96 %] 96 % (08/19 0954) Weight:  [76.3 kg] 76.3 kg (08/18 2106)  General: lying comfortably in bed CVS: pulse-normal rate and rhythm RS: breathing comfortably on room air  Extremities: normal   Neuro: MS: Alert, oriented x3, follows commands CN: pupils equal and reactive,  EOMI, face symmetric, tongue midline, normal sensation over face  Motor: 5/5 strength in all 4 extremities. Now exhibiting myoclonic jerks on all 4 extremities  Reflexes: 2+ bilaterally over patella and biceps Coordination: bilateral dysmetria  Gait: not tested  Basic Metabolic Panel: Recent Labs  Lab 07/27/19 0810 07/28/19 0431 07/29/19 0306 07/31/19 0410 07/31/19 0913  NA 132* 132* 132* 120* 118*  K 4.2 3.8 3.9 4.3 4.1  CL 103 102 101 91* 90*  CO2 19* 19* 19* 17* 14*  GLUCOSE 82 63* 58* 257* 274*  BUN 33* 21 17 46* 50*  CREATININE 1.52* 1.13* 1.11* 2.47* 2.46*  CALCIUM 8.9 8.3* 8.6* 7.9* 7.7*    CBC: Recent Labs  Lab 07/26/19 1346 07/27/19 0810 07/27/19 1211 07/28/19 0431 07/29/19 0306 07/31/19 0410  WBC 14.6* 15.9* 15.2* 15.7* 17.0* 15.5*  NEUTROABS 11.1*  --   --   --   --  14.5*  HGB 9.1* 6.5* 6.3* 10.0* 10.7* 8.9*  HCT 26.8* 19.8* 18.9* 29.7* 31.3* 25.0*  MCV 95.4 98.0 96.4 92.8 91.8 89.3  PLT 253 229 205 210 227 222     Coagulation Studies: No results for input(s): LABPROT, INR in the last 72  hours.  Imaging Reviewed:  MRI brain: Limited by motion artifact.  No acute intracranial pathology identified.  Possible right vertebral artery stenosis, mild generalized parenchymal atrophy with moderate chronic small vessel ischemic disease.  No evidence of PRES.    ASSESSMENT AND PLAN  Ms. Mulhearn is a 73 yo female with history of metastatic ovarian cancer in remission who was recently admitted for AMS and treated for PRES thought to be secondary to chemotherapy-induced severe HTN (on bevacizumab). Her hospital course was complicated by pneumonia treated as PJP and ischemic ATN. Now admitted for AKI and hyperkalemia and noted to have AMS, visual hallucinations and choreiform movements.   Impression/Recommendations  Acute toxic/metabolic encephalopathy  Myoclonus  Very interesting case. At this time, we are not quite sure what is the underlying cause of her encephalopathy and myoclonus. We are currently awaiting paraneoplastic serum panel which will take a couple of weeks to result. However, her marked improvement over the last 2 days is not consistent with a paraneoplastic syndrome and more so with delirium which is being managed with Seroquel.   Today she is experiencing worsening confusion and myoclonic jerks on all 4 extremities after discontinuation of IV Solumedrol yesterday. However, on review of chart she did receive yesterday's dose in the morning. MRI brain was limited by motion artifact but overall did not show any acute intracranial pathology or changes consistent with PRES which she has a recent history  of. She does have new, acute hyponatremia with a sodium of 118 and worsening AKI with doubling of BUN and creatinine (?uremia) which could explain worsening of symptoms today. Recommend management of hyponatremia and AKI, watching her off steroids today and reassessing tomorrow morning. Would also recommend decreasing Seroquel dose 100-> 25 mg QHS.   - Continue to monitor for  clinical improvement off steroid therapy  - Management of acute hyponatremia and worsening AKI  - Decrease Seroquel to 25 mg QHS   Final recommendations per neurology attending, Dr. Lorraine Lax.   Welford Roche, MD  Internal Medicine PGY-3  P [phone]   NEUROHOSPITALIST ADDENDUM Performed a face to face diagnostic evaluation.   I have reviewed the contents of history and physical exam as documented by PA/ARNP/Resident and agree with above documentation.  I have discussed and formulated the above plan as documented. Edits to the note have been made as needed.  Per resident patient was more somnolent and was hallucinating.  On my examination patient is alert and no longer hallucinating.  She is not having any myoclonus at rest , however she does have some asterixis.  We will continue to hold steroids.  Patient received last dose yesterday.  We will also reduce Seroquel to 25 mg nightly.    Karena Addison Braxtyn Bojarski MD [facility] [phone]   If 7pm to 7am, please call on call as listed on AMION.

## 2019-07-31 NOTE — Progress Notes (Signed)
Subjective:  Rhonda Hale was seen at bedside with her daughter. She is awake. She states has taken a shower, cleaned herself, and walked around the room, but according to her daughter, she has been in her bed all day. Her daughter states that her mother has been restless throughout the night. Her daughter says that the restlessness is not as bad as compared to her admission, but feels her mother has "taken a step back." We discussed the results of her mother's MRI and EEG. All questions and concerns were addressed.    Objective:  Vital signs in last 24 hours: Vitals:   07/30/19 1713 07/30/19 2106 07/31/19 0522 07/31/19 0806  BP: (!) 99/46 116/64 98/68   Pulse: 78 81 77 72  Resp: 18 14 18 20   Temp: 98.2 F (36.8 C) 98.1 F (36.7 C) 98.2 F (36.8 C)   TempSrc: Oral Oral Oral   SpO2: 96% 94%  95%  Weight:  76.3 kg    Height:       Physical Exam Constitutional:      General: She is not in acute distress.    Appearance: She is obese. She is not ill-appearing, toxic-appearing or diaphoretic.     Comments: Appears anxious at bedside. She frequently gets sidetracked, her stories are fragmented, and not easily redirected.   HENT:     Head: Normocephalic and atraumatic.  Cardiovascular:     Rate and Rhythm: Normal rate and regular rhythm.     Heart sounds: No murmur. No friction rub. No gallop.   Pulmonary:     Effort: Pulmonary effort is normal.     Breath sounds: Normal breath sounds.  Abdominal:     General: Abdomen is flat. Bowel sounds are normal. There is no distension.     Palpations: Abdomen is soft.     Tenderness: There is no abdominal tenderness.  Neurological:     Mental Status: She is disoriented.     Assessment/Plan:  Principal Problem:   Encephalopathy Active Problems:   AKI (acute kidney injury) ([place])   Dehydration   AMS (altered mental status) Rhonda Hale is a 73 y/o female that presents to [facility] with an AKI, AMS, and Hyperkalemia.   Hyponatremia: - Na:  118 - Ordered osmolality.  - Ordered sodium, urine, random.  - Ordered creatinine, urine, random.  - Will consult with Nephrology.   AKI: - Creatinine: 2.46 - D/C D5W - Continue I/O Monitoring  - Continue to avoid nephrotoxic drugs. - Discontinue Bactrim in presence of new onset AKI.    New Onset Tremors:  - Ordered MRI brain w w/o contrast: Motion artifacts with signal abnormality within non dominant distal R vertebral artery, which may reflect slow or high grade stenosis/occlusion.  - Neurology consulted, we appreciate their help with Rhonda Hale's care.   - Discontinued Solumedrol IV, per Neurology recommendation.   - EEG suggestive of cortical dysfunction in the L more than the R temporal region which is nonspecific to etiology. No seizure or epileptiform activity noted.   - Serum paraneoplastic panel results: pending.   Altered Mental Status:  - Hold home Prednisone in the case AMS is 2/2 to steroid psychosis. - Blood cultures: NGTD 5 days. - Seroquel 100 mg for agitation. - Delirium precautions.  New Onset Anemia:  - Hgb: 8.9  Metastatic Ovarian Cancer:  - Continue Megace 40 mg BID.  Hypothyroidism:  - Continue Synthroid 137 mg QD. - Ordered free T4.   Hyperkalemia: Resolved  Dispo: Anticipated discharge  pending medical course.   Maudie Mercury, MD 07/31/2019, 9:18 AM Pager: [phone]

## 2019-07-31 NOTE — Plan of Care (Signed)
  Problem: Nutrition: Goal: Adequate nutrition will be maintained Outcome: Progressing   Problem: Coping: Goal: Level of anxiety will decrease Outcome: Progressing   

## 2019-07-31 NOTE — Progress Notes (Signed)
Physical Therapy Treatment Patient Details Name: Rhonda Hale MRN: [id number] DOB: September 12, 1946 Today's Date: 07/31/2019    History of Present Illness 73 y.o. yo female w/ PMH significant for metastatic ovarian cancer and macrocytic anemia found to have PRES syndrome on last hospital admission presenting to ED from home for worsening confusion and abnormal lab result.    PT Comments    Pt was seen with daughter in attendance to sit up on side of bed and work on bedside standing and sidestepping.  Pt is following instructions fairly well with extra time for following her cues to flex trunk in transition to sit.  Pt is more mobile and more controlled today with two moments of sudden extended movement, once standing and once sitting.  Would recommend that PT work with another person to walk away from the bed, and would recommend she have a chair behind her to walk.  Follow acutely for this progression as she awaits transition to CIR.     Follow Up Recommendations  CIR     Equipment Recommendations  None recommended by PT(await disposition in CIR)    Recommendations for Other Services Rehab consult     Precautions / Restrictions Precautions Precautions: Fall Restrictions Weight Bearing Restrictions: No Other Position/Activity Restrictions: pts daughter present 24/7 and is a nurse    Mobility  Bed Mobility Overal bed mobility: Needs Assistance Bed Mobility: Supine to Sit;Sit to Supine     Supine to sit: Mod assist Sit to supine: Mod assist   General bed mobility comments: pt was able to help sit up and return to bed, used bed rail to sit up, back with no rail  Transfers Overall transfer level: Needs assistance Equipment used: Rolling walker (2 wheeled) Transfers: Sit to/from Stand Sit to Stand: Min assist;From elevated surface;Mod assist         General transfer comment: min assist on follow up standing but initially was mod to stand and mod one trial to sit where pt was  extending strongly and could not control descent to bed  Ambulation/Gait Ambulation/Gait assistance: Min assist;Mod assist Gait Distance (Feet): 10 Feet(3+3+4) Assistive device: Rolling walker (2 wheeled);1 person hand held assist Gait Pattern/deviations: Step-to pattern Gait velocity: Decreased Gait velocity interpretation: <1.8 ft/sec, indicate of risk for recurrent falls General Gait Details: sidestepping on side of bed with dense cues for pt to keep walker close and to sidestep alternately with walker movement   Stairs             Wheelchair Mobility    Modified Rankin (Stroke Patients Only)       Balance Overall balance assessment: Needs assistance Sitting-balance support: Feet supported;Single extremity supported Sitting balance-Leahy Scale: Poor Sitting balance - Comments: pt had one incident of listing back quickly and reported this was because she was tired                                    Cognition Arousal/Alertness: Awake/alert Behavior During Therapy: Impulsive;Anxious Overall Cognitive Status: Impaired/Different from baseline Area of Impairment: Problem solving;Awareness;Safety/judgement;Following commands;Memory                     Memory: Decreased recall of precautions;Decreased short-term memory Following Commands: Follows one step commands inconsistently;Follows one step commands with increased time Safety/Judgement: Decreased awareness of safety;Decreased awareness of deficits Awareness: Intellectual;Emergent Problem Solving: Slow processing;Requires verbal cues;Requires tactile cues  Exercises      General Comments General comments (skin integrity, edema, etc.): Pt was seen for mobility with walker to stand and step along side of bed.  Due to impulsive moments of sitting with poor control, pt was kept at side of bed to avoid unexpected sitting but also a very low endurance was noted      Pertinent Vitals/Pain  Pain Assessment: No/denies pain    Home Living     Available Help at Discharge: Family;Available 24 hours/day Type of Home: House              Prior Function            PT Goals (current goals can now be found in the care plan section) Acute Rehab PT Goals Patient Stated Goal: to walk Progress towards PT goals: Progressing toward goals    Frequency    Min 3X/week      PT Plan Current plan remains appropriate    Co-evaluation              AM-PAC PT "6 Clicks" Mobility   Outcome Measure  Help needed turning from your back to your side while in a flat bed without using bedrails?: A Little Help needed moving from lying on your back to sitting on the side of a flat bed without using bedrails?: A Lot Help needed moving to and from a bed to a chair (including a wheelchair)?: A Lot Help needed standing up from a chair using your arms (e.g., wheelchair or bedside chair)?: A Lot Help needed to walk in hospital room?: A Lot Help needed climbing 3-5 steps with a railing? : Total 6 Click Score: 12    End of Session Equipment Utilized During Treatment: Gait belt Activity Tolerance: Patient limited by fatigue;Treatment limited secondary to medical complications (Comment)(possibly extension tone with standing) Patient left: in bed;with call bell/phone within reach;with bed alarm set;with family/visitor present Nurse Communication: Mobility status PT Visit Diagnosis: Unsteadiness on feet (R26.81);Other abnormalities of gait and mobility (R26.89);Muscle weakness (generalized) (M62.81);Other symptoms and signs involving the nervous system (R29.898)     Time: 7412-8786 PT Time Calculation (min) (ACUTE ONLY): 27 min  Charges:  $Gait Training: 8-22 mins $Therapeutic Activity: 8-22 mins                    Ramond Dial 07/31/2019, 12:28 PM    Mee Hives, PT MS Acute Rehab Dept. Number: [place] and [place], [place]

## 2019-07-31 NOTE — Consult Note (Signed)
[facility] Renal Consultation Note  Requesting MD: Joni Reining, DO  Indication for Consultation:  Hyponatremia, AKI   Chief complaint: high potassium   HPI:  Rhonda Hale is a 73 y.o. female history including ovarian cancer previously treated with paclitaxel and Avastin who came into the ER after outpatient labs demonstrated hyperkalemia and worsening renal function.  Her potassium was 5.9 and creatinine was improved to 1.39 on presentation.  Hyperkalemia has resolved.  She has had altered mental status and neurology has been consulted for the same.  She had been doing better however this morning was more confused.  Her daughter is at bedside and states that she has been seeing things in the room but has been oriented.  She was seen recently during prior admission for AKI which was felt ischemic insults from hypotension at that time.  Note that she had been on D5W which was discontinued this morning upon receipt of the abnormal labs per the team.  She has also been on an SSRI and reports that she has been on this for several years.  Appetite has not been great.  She ate less last night.  She has not had any nausea or vomiting.  Her daughter has noticed decreased urine output as they have been changing her less frequently.  She is incontinent of urine at baseline.  She does not use NSAIDs for at least the past 6 months and only used intermittently prior to that.  Noted to have developed AKI with Cr 2.46 today.   Creatinine, Ser  Date/Time Value Ref Range Status  07/31/2019 09:13 AM 2.46 (H) 0.44 - 1.00 mg/dL Final  07/31/2019 04:10 AM 2.47 (H) 0.44 - 1.00 mg/dL Final  07/29/2019 03:06 AM 1.11 (H) 0.44 - 1.00 mg/dL Final  07/28/2019 04:31 AM 1.13 (H) 0.44 - 1.00 mg/dL Final  07/27/2019 08:10 AM 1.52 (H) 0.44 - 1.00 mg/dL Final  07/26/2019 01:46 PM 1.80 (H) 0.44 - 1.00 mg/dL Final  07/24/2019 11:44 AM 1.39 (H) 0.57 - 1.00 mg/dL Final  07/15/2019 04:30 AM 1.20 (H) 0.44 - 1.00 mg/dL  Final  07/14/2019 04:35 AM 1.17 (H) 0.44 - 1.00 mg/dL Final  07/13/2019 12:21 AM 1.33 (H) 0.44 - 1.00 mg/dL Final  07/12/2019 04:33 AM 1.65 (H) 0.44 - 1.00 mg/dL Final  07/11/2019 03:51 AM 2.33 (H) 0.44 - 1.00 mg/dL Final  07/10/2019 02:51 PM 3.11 (H) 0.44 - 1.00 mg/dL Final  07/10/2019 04:52 AM 3.38 (H) 0.44 - 1.00 mg/dL Final  07/10/2019 12:22 AM 3.59 (H) 0.44 - 1.00 mg/dL Final  07/09/2019 03:01 PM 3.22 (H) 0.44 - 1.00 mg/dL Final  07/09/2019 04:29 AM 2.63 (H) 0.44 - 1.00 mg/dL Final    Comment:    DELTA CHECK NOTED  07/08/2019 03:27 AM 1.09 (H) 0.44 - 1.00 mg/dL Final  07/07/2019 03:29 AM 1.05 (H) 0.44 - 1.00 mg/dL Final  07/06/2019 04:21 AM 1.26 (H) 0.44 - 1.00 mg/dL Final  07/05/2019 04:02 AM 1.11 (H) 0.44 - 1.00 mg/dL Final  07/04/2019 03:50 AM 1.32 (H) 0.44 - 1.00 mg/dL Final  07/03/2019 05:55 PM 1.25 (H) 0.44 - 1.00 mg/dL Final  07/03/2019 09:38 AM 1.23 (H) 0.44 - 1.00 mg/dL Final     PMHx:   Past Medical History:  Diagnosis Date  . Cancer [facility])     Past Surgical History:  Procedure Laterality Date  . ABDOMINAL HYSTERECTOMY N/A     Family Hx: No family history on file.  Social History:  reports that she has never smoked.  She has never used smokeless tobacco. She reports previous alcohol use. She reports that she does not use drugs.  Allergies:  Allergies  Allergen Reactions  . Atorvastatin Other (See Comments)    Severe heartburn, reflux    Medications: Prior to Admission medications   Medication Sig Start Date End Date Taking? Authorizing Provider  amLODipine (NORVASC) 10 MG tablet Take 1 tablet (10 mg total) by mouth daily. 07/18/19  Yes Helberg, Larkin Ina, MD  calcitRIOL (ROCALTROL) 0.5 MCG capsule Take 0.5 mcg by mouth 2 (two) times a day. 05/13/19  Yes [provider]  Calcium Carb-Cholecalciferol (CALCIUM-VITAMIN D) 600-400 MG-UNIT TABS Take 1 tablet by mouth 2 (two) times a day.   Yes [provider]  escitalopram (LEXAPRO) 20 MG tablet  Take 20 mg by mouth daily.   Yes [provider]  labetalol (NORMODYNE) 100 MG tablet Take 0.5 tablets (50 mg total) by mouth 2 (two) times daily. 07/17/19  Yes Helberg, Larkin Ina, MD  levothyroxine (SYNTHROID) 137 MCG tablet Take 137 mcg by mouth daily before breakfast.   Yes [provider]  lidocaine-prilocaine (EMLA) cream Apply 1 application topically daily as needed (prior to port access).  08/17/18 08/17/19 Yes [provider]  megestrol (MEGACE) 20 MG tablet Take 40 mg by mouth 2 (two) times daily. 07/22/19  Yes [provider]  predniSONE (DELTASONE) 20 MG tablet Take 40 mg once daily for 8 days then 20 mg once daily for 11 days. Patient taking differently: Take 20-40 mg by mouth See admin instructions. Ordered 07/17/19: Take two tablets (40 mg) once daily for 8 days. Then take one tablet  (20 mg) once daily for 11 days. 07/17/19  Yes Helberg, Larkin Ina, MD  rivaroxaban (XARELTO) 20 MG TABS tablet Take 20 mg by mouth at bedtime.    Yes [provider]  sulfamethoxazole-trimethoprim (BACTRIM DS) 800-160 MG tablet Take 2 tablets by mouth every 8 (eight) hours for 16 days. 07/17/19 08/02/19 Yes Helberg, Larkin Ina, MD  Tiotropium Bromide Monohydrate (SPIRIVA RESPIMAT) 1.25 MCG/ACT AERS Inhale 1 puff into the lungs daily as needed (shortness of breath).   Yes [provider]  dexamethasone (DECADRON) 4 MG tablet Take 4 mg by mouth See admin instructions. Take one tablet (4 mg) by mouth twice daily on days 2 and 3 after chemotherapy 06/07/19   [provider]  ondansetron (ZOFRAN) 8 MG tablet Take 8 mg by mouth See admin instructions. Take one tablet (8 mg) by mouth twice daily on days 2 and 3 after chemotherapy    [provider]    I have reviewed the patient's current medications.  Labs:  BMP Latest Ref Rng & Units 07/31/2019 07/31/2019 07/29/2019  Glucose 70 - 99 mg/dL 274(H) 257(H) 58(L)  BUN 8 - 23 mg/dL 50(H) 46(H) 17  Creatinine 0.44 - 1.00  mg/dL 2.46(H) 2.47(H) 1.11(H)  BUN/Creat Ratio 12 - 28 - - -  Sodium 135 - 145 mmol/L 118(LL) 120(L) 132(L)  Potassium 3.5 - 5.1 mmol/L 4.1 4.3 3.9  Chloride 98 - 111 mmol/L 90(L) 91(L) 101  CO2 22 - 32 mmol/L 14(L) 17(L) 19(L)  Calcium 8.9 - 10.3 mg/dL 7.7(L) 7.9(L) 8.6(L)    Urinalysis    Component Value Date/Time   COLORURINE YELLOW 07/27/2019 0707   APPEARANCEUR HAZY (A) 07/27/2019 0707   LABSPEC 1.012 07/27/2019 0707   PHURINE 6.0 07/27/2019 0707   GLUCOSEU NEGATIVE 07/27/2019 0707   HGBUR LARGE (A) 07/27/2019 0707   BILIRUBINUR NEGATIVE 07/27/2019 0707   KETONESUR NEGATIVE  07/27/2019 0707   PROTEINUR NEGATIVE 07/27/2019 0707   NITRITE NEGATIVE 07/27/2019 0707   LEUKOCYTESUR NEGATIVE 07/27/2019 0707     ROS:  Pertinent items noted in HPI and remainder of comprehensive ROS otherwise negative.  Physical Exam: Vitals:   07/31/19 0900 07/31/19 0954  BP: 103/80 103/80  Pulse: 96 76  Resp: 18 18  Temp: (!) 97.5 F (36.4 C) (!) 97.5 F (36.4 C)  SpO2: 96% 96%     General: elderly female in bed in no acute disterss HEENT: NCAT  Eyes: EOMI ;sclera anicteric  Neck: supple trachea midline  Heart: RRR; no rub Lungs: clear and unlabored Abdomen: soft/NT/ND; normal bowel sounds Extremities: no edema; no cyanosis Skin: no rash on extremities exposed  Neuro: oriented to person, place and location but has been seeing things per her daughter and circumferential   Assessment/Plan:  # Hyponatremia - Secondary in part to hypotonic fluids.  May also be in part paraneoplastic - Agree with stopping D5W.  Asked RN to also discontinue the D5 hanging at [facility] - Na corrects to 121 with adjustment for hyperglycemia  - Stat BMP with results to me  - Discontinue SSRI  - urine sodium was checked; serum osms checked; different collection but will obtain urine osm   # AKI  - Has had Ischemic insults/ATN with mild hypotension as well as retention - Checked bladder scan and over 300 mL   - Insert foley catheter  - Hx of recent AKI which was felt in part 2/2 ATN with hypotension   # AMS - optimize hyponatremia as above   # Hyperglycemia - per primary team   # CKD stage III - baseline Cr is 1.1 - 1.3  # Ovarian cancer - note s/p paclitaxel and avastin   # Hyperkalemia resolved   Claudia Desanctis 07/31/2019, 12:26 PM

## 2019-07-31 NOTE — Progress Notes (Signed)
PHARMACY NOTE:  ANTIMICROBIAL RENAL DOSAGE ADJUSTMENT  Current antimicrobial regimen includes a mismatch between antimicrobial dosage and estimated renal function.  As per policy approved by the Pharmacy & Therapeutics and Medical Executive Committees, the antimicrobial dosage will be adjusted accordingly.  Current antimicrobial dosage:  Bactrim DS 2 tabs Q8h  Indication: PJP Pneumonia  Renal Function: Patient with newly developed AKI  Estimated Creatinine Clearance: 18.8 mL/min (A) (by C-G formula based on SCr of 2.47 mg/dL (H)).     Antimicrobial dosage has been changed to:  Bactrim DS 2 tabs q12h  Additional comments:   Hailyn Zarr A. Levada Dy, PharmD, BCPS, FNKF Clinical Pharmacist [place] Please utilize Amion for appropriate phone number to reach the unit pharmacist ([place])   07/31/2019 7:46 AM

## 2019-07-31 NOTE — Evaluation (Signed)
Clinical/Bedside Swallow Evaluation Patient Details  Name: Tammera Engert MRN: [id number] Date of Birth: [birth year]  Today's Date: 07/31/2019 Time: SLP Start Time (ACUTE ONLY): 1497 SLP Stop Time (ACUTE ONLY): 1008 SLP Time Calculation (min) (ACUTE ONLY): 10 min  Past Medical History:  Past Medical History:  Diagnosis Date  . Cancer [facility])    Past Surgical History:  Past Surgical History:  Procedure Laterality Date  . ABDOMINAL HYSTERECTOMY N/A    HPI:  73 y.o. yo female w/ PMH significant for metastatic ovarian cancer and macrocytic anemia found to have PRES syndrome on last hospital admission presenting to ED from home for worsening confusion and abnormal lab result.   Assessment / Plan / Recommendation Clinical Impression  Pt presents with a functional oropharyngeal swallow marked by adequate mastication, brisk swallow response, no s/s of aspiration.  She describes poor appetite and fatigue, but there are no s/s of dysphagia.  No f/u needed for swallowing   SLP Visit Diagnosis: Dysphagia, unspecified (R13.10)    Aspiration Risk    minimal   Diet Recommendation   regular solids, thin liquids  Medication Administration: Whole meds with puree(per dtr's recommendation)    Other  Recommendations Oral Care Recommendations: Oral care BID   Follow up Recommendations None      Frequency and Duration            Prognosis        Swallow Study   General Date of Onset: 07/26/19 HPI: 73 y.o. yo female w/ PMH significant for metastatic ovarian cancer and macrocytic anemia found to have PRES syndrome on last hospital admission presenting to ED from home for worsening confusion and abnormal lab result. Type of Study: Bedside Swallow Evaluation Diet Prior to this Study: Regular;Thin liquids Temperature Spikes Noted: No Respiratory Status: Room air History of Recent Intubation: No Behavior/Cognition: Alert;Cooperative;Confused Oral Cavity Assessment: Within Functional Limits Oral  Care Completed by SLP: Recent completion by staff Oral Cavity - Dentition: Adequate natural dentition Vision: Functional for self-feeding Self-Feeding Abilities: Able to feed self;Needs assist Patient Positioning: Upright in bed Baseline Vocal Quality: Normal Volitional Cough: Strong Volitional Swallow: Able to elicit    Oral/Motor/Sensory Function Overall Oral Motor/Sensory Function: Within functional limits   Ice Chips Ice chips: Within functional limits   Thin Liquid Thin Liquid: Within functional limits    Nectar Thick Nectar Thick Liquid: Not tested   Honey Thick Honey Thick Liquid: Not tested   Puree Puree: Within functional limits   Solid     Solid: Not tested      Juan Quam Laurice 07/31/2019,10:29 AM Estill Bamberg L. Tivis Ringer, [place] Office number [phone] Pager [phone]

## 2019-07-31 NOTE — Evaluation (Signed)
Speech Language Pathology Evaluation Patient Details Name: Rhonda Hale MRN: [id number] DOB: Mar 20, 1946 Today's Date: 07/31/2019 Time: 3474-2595 SLP Time Calculation (min) (ACUTE ONLY): 15 min  Problem List:  Patient Active Problem List   Diagnosis Date Noted  . Encephalopathy 07/27/2019  . AMS (altered mental status) 07/27/2019  . Hyperkalemia 07/26/2019  . Dehydration 07/26/2019  . Multifocal pneumonia 07/13/2019  . PRES (posterior reversible encephalopathy syndrome) 07/07/2019  . AKI (acute kidney injury) ([place]) 07/03/2019  . Hypercalcemia 07/03/2019  . Hypomagnesemia, chronic 07/03/2019   Past Medical History:  Past Medical History:  Diagnosis Date  . Cancer [facility])    Past Surgical History:  Past Surgical History:  Procedure Laterality Date  . ABDOMINAL HYSTERECTOMY N/A    HPI:  73 y.o. yo female w/ PMH significant for metastatic ovarian cancer and macrocytic anemia found to have PRES syndrome on last hospital admission presenting to ED from home for worsening confusion and abnormal lab result.   Assessment / Plan / Recommendation Clinical Impression  Pt presents with broad deficits in cognition and language.  Pragmatic, social communication is WNL.  Speech is fluent, marked by phonemic substitutions and errors in word-retrieval, with intermittent awareness of difficulty.  Pt presents with some ongoing visual hallucinations and restlessness.  Attention waxes and wanes, as well as ability to follow commands and produce coherent responses to questions.  Expression is marked by tangential output with poor sustained attention, semantic substitutions, and intermittent nonsensical content.  Daughter, who is a Marine scientist, does a great job with redirecting her mother and managing the fluctuations in cognitive ability.  Recommend SLP f/u to address compensatory strategies to facilitate memory/attention/language.  D/W pt, dtr, who agree.     SLP Assessment  SLP Recommendation/Assessment:  Patient needs continued Speech Lanaguage Pathology Services SLP Visit Diagnosis: Cognitive communication deficit (R41.841)    Follow Up Recommendations  Inpatient Rehab    Frequency and Duration min 2x/week  2 weeks      SLP Evaluation Cognition  Overall Cognitive Status: Impaired/Different from baseline Arousal/Alertness: Awake/alert Orientation Level: Oriented to person;Oriented to place;Disoriented to time;Disoriented to situation Attention: Sustained Sustained Attention: Impaired Sustained Attention Impairment: Verbal basic Memory: Impaired Memory Impairment: Retrieval deficit Awareness: Impaired Awareness Impairment: Intellectual impairment Problem Solving: Impaired Problem Solving Impairment: Verbal basic Safety/Judgment: Impaired       Comprehension  Auditory Comprehension Overall Auditory Comprehension: Impaired Yes/No Questions: Within Functional Limits Commands: Impaired Conversation: Simple    Expression Expression Primary Mode of Expression: Verbal Verbal Expression Overall Verbal Expression: Impaired Initiation: No impairment Automatic Speech: (wfl) Level of Generative/Spontaneous Verbalization: Conversation Repetition: No impairment Naming: Impairment Responsive: 76-100% accurate Confrontation: Within functional limits Convergent: Not tested Divergent: 0-24% accurate Verbal Errors: Phonemic paraphasias;Semantic paraphasias Pragmatics: No impairment   Oral / Motor  Oral Motor/Sensory Function Overall Oral Motor/Sensory Function: Within functional limits Motor Speech Overall Motor Speech: Appears within functional limits for tasks assessed   GO                    Rhonda Hale 07/31/2019, 10:42 AM  Rhonda Hale, Rhonda Hale Office number [phone] Pager [phone]

## 2019-07-31 NOTE — Progress Notes (Signed)
Internal Medicine Attending:   I saw and examined the patient. I reviewed the resident's note and I agree with the resident's findings and plan as documented in the resident's note. Patient seen at bedside with daughter present.  Still has some confusion but otherwise answers questions for the most part appropriately and seems to follow hospital course well.  Most notably labs this morning showed new AKI, mild non-anion gap metabolic acidosis and significantly worsened hyponatremia she has been on D5W which was started on the 17th for some hypoglycemia.  Unfortunately labs were not checked on the 18th.  We performed a stat repeat of her labs which returned consistent with previous results.  In this case discontinue D5W check urine lites, osmolality.  We are consulting nephrology for further assistance with this acutely worsened hyponatremia, and AKI.  Currently the hyponatremia may be worsening her neurologic status we are still unclear as to the root cause of her encephalopathy. Other notable findings on labs are hemoglobin has again decreased to 8.9 this may be in part dilutional we will need to monitor closely as she has previously required transfusions.

## 2019-08-01 LAB — BASIC METABOLIC PANEL
Anion gap: 10 (ref 5–15)
Anion gap: 11 (ref 5–15)
BUN: 60 mg/dL — ABNORMAL HIGH (ref 8–23)
BUN: 53 mg/dL — ABNORMAL HIGH (ref 8–23)
CO2: 17 mmol/L — ABNORMAL LOW (ref 22–32)
CO2: 17 mmol/L — ABNORMAL LOW (ref 22–32)
Calcium: 8 mg/dL — ABNORMAL LOW (ref 8.9–10.3)
Calcium: 8.1 mg/dL — ABNORMAL LOW (ref 8.9–10.3)
Chloride: 97 mmol/L — ABNORMAL LOW (ref 98–111)
Chloride: 101 mmol/L (ref 98–111)
Creatinine, Ser: 2.24 mg/dL — ABNORMAL HIGH (ref 0.44–1.00)
Creatinine, Ser: 1.85 mg/dL — ABNORMAL HIGH (ref 0.44–1.00)
GFR calc Af Amer: 24 mL/min — ABNORMAL LOW (ref >60)
GFR calc Af Amer: 30 mL/min — ABNORMAL LOW (ref >60)
GFR calc non Af Amer: 21 mL/min — ABNORMAL LOW (ref >60)
GFR calc non Af Amer: 26 mL/min — ABNORMAL LOW (ref >60)
Glucose, Bld: 196 mg/dL — ABNORMAL HIGH (ref 70–99)
Glucose, Bld: 204 mg/dL — ABNORMAL HIGH (ref 70–99)
Potassium: 4.2 mmol/L (ref 3.5–5.1)
Potassium: 3.9 mmol/L (ref 3.5–5.1)
Sodium: 124 mmol/L — ABNORMAL LOW (ref 135–145)
Sodium: 129 mmol/L — ABNORMAL LOW (ref 135–145)

## 2019-08-01 LAB — ARBOVIRUS PANEL, ~~LOC~~ LAB

## 2019-08-01 MED ORDER — LEVOTHYROXINE SODIUM 75 MCG PO TABS
150.0000 ug | ORAL_TABLET | Freq: Every day | ORAL | Status: DC
  Administered 2019-08-02: 150 ug via ORAL
  Filled 2019-08-01: qty 2

## 2019-08-01 NOTE — Progress Notes (Signed)
Removed foley catheter due to leaking, pt doesn't want me to reinsert a new one Dr. Gilford Rile made aware. Daughter requested to put the [place] instead.

## 2019-08-01 NOTE — Progress Notes (Signed)
Occupational Therapy Treatment Patient Details Name: Rhonda Hale MRN: [id number] DOB: 1946/05/21 Today's Date: 08/01/2019    History of present illness 73 y.o. yo female w/ PMH significant for metastatic ovarian cancer and macrocytic anemia found to have PRES syndrome on last hospital admission presenting to ED from home for worsening confusion and abnormal lab result.   OT comments  Pt seen for ADL progression and functional mobility. Overall, pt MIN A for functional mobility requiring verbal cues for safety.   Pt required MOD- MAX assist for LB/UB dressing. Able to cross ankle over knee but unable to don sock d/t decreased dynamic sitting balance. Increased assist needed for orientation of hospital gown and sequencing of steps. Pt completed stand pivot transfer from EOB> recliner with MIN A with RW. Still recommending CIR. Feel pt would benefit from structured environment to reach MIN A goals. Pt has great family support with daughter in the room during session. Will continue to follow for acute OT needs.   Follow Up Recommendations  CIR;Supervision/Assistance - 24 hour    Equipment Recommendations  Other (comment)(TBD to next venue)    Recommendations for Other Services      Precautions / Restrictions Precautions Precautions: Fall Restrictions Weight Bearing Restrictions: No Other Position/Activity Restrictions: pts daughter present 24/7 and is a nurse       Mobility Bed Mobility Overal bed mobility: Needs Assistance Bed Mobility: Supine to Sit;Sit to Supine     Supine to sit: Min guard Sit to supine: Min assist   General bed mobility comments: pt was able to get to EOB with MIN guard for safety, using bed rails with HOB elevated. Cues to get feet on floor before pt attempt to stand  Transfers Overall transfer level: Needs assistance Equipment used: Rolling walker (2 wheeled) Transfers: Sit to/from Stand Sit to Stand: Min assist;From elevated surface Stand pivot  transfers: Min assist       General transfer comment: Pt min A for transfer and sit>stand. most assist needed for safety awareness and hand placement during functional transfers    Balance Overall balance assessment: Needs assistance Sitting-balance support: Feet supported;Single extremity supported Sitting balance-Leahy Scale: Poor Sitting balance - Comments: pt unable to don sock from EOB d/t poor sitting balance   Standing balance support: Bilateral upper extremity supported;During functional activity Standing balance-Leahy Scale: Poor Standing balance comment: Reliant on BUE support for stability.                            ADL either performed or assessed with clinical judgement   ADL Overall ADL's : Needs assistance/impaired Eating/Feeding: Minimal assistance Eating/Feeding Details (indicate cue type and reason): daughter stated she was no longer using AE and eating with MIN A now             Upper Body Dressing : Minimal assistance Upper Body Dressing Details (indicate cue type and reason): verbal cues for sequencing of donning hospital gown Lower Body Dressing: Moderate assistance Lower Body Dressing Details (indicate cue type and reason): able to complete figure four but unable to don sock d/t decreaed sitting balance- able to pull up sock with MOD external support Toilet Transfer: Minimal assistance;Min guard;Stand-pivot;RW Toilet Transfer Details (indicate cue type and reason): standpivot from EOB to recliner- cues to reach back when sitting and to not pull up on RW         Functional mobility during ADLs: Min guard;Minimal assistance General ADL Comments: requries verbal cues  for sequencing of tasks and safety     Vision       Perception     Praxis      Cognition Arousal/Alertness: Awake/alert Behavior During Therapy: Anxious;Impulsive Overall Cognitive Status: Impaired/Different from baseline Area of Impairment: Problem  solving;Awareness;Safety/judgement;Following commands;Memory                     Memory: Decreased short-term memory(asked me several times where her robe was- cued pt that it was not here) Following Commands: Follows one step commands inconsistently;Follows one step commands with increased time Safety/Judgement: Decreased awareness of safety;Decreased awareness of deficits Awareness: Intellectual;Emergent Problem Solving: Slow processing;Requires verbal cues;Requires tactile cues General Comments: pt with poor safety awareness and decreaed insight into deficits. pt impulsive during transfer and required MOD verbal cues for safety.        Exercises     Shoulder Instructions       General Comments      Pertinent Vitals/ Pain       Pain Assessment: No/denies pain  Home Living                                          Prior Functioning/Environment              Frequency  Min 3X/week        Progress Toward Goals  OT Goals(current goals can now be found in the care plan section)  Progress towards OT goals: Progressing toward goals  Acute Rehab OT Goals Time For Goal Achievement: 08/12/19 Potential to Achieve Goals: Good  Plan Discharge plan remains appropriate    Co-evaluation                 AM-PAC OT "6 Clicks" Daily Activity     Outcome Measure   Help from another person eating meals?: A Little Help from another person taking care of personal grooming?: A Lot Help from another person toileting, which includes using toliet, bedpan, or urinal?: A Lot Help from another person bathing (including washing, rinsing, drying)?: A Lot Help from another person to put on and taking off regular upper body clothing?: A Little Help from another person to put on and taking off regular lower body clothing?: A Lot 6 Click Score: 14    End of Session Equipment Utilized During Treatment: Gait belt  OT Visit Diagnosis: Unsteadiness on feet  (R26.81);Other abnormalities of gait and mobility (R26.89);Muscle weakness (generalized) (M62.81);Ataxia, unspecified (R27.0);Other symptoms and signs involving cognitive function   Activity Tolerance Patient tolerated treatment well   Patient Left in chair;with call bell/phone within reach;with family/visitor present   Nurse Communication Mobility status        Time: 9509-3267 OT Time Calculation (min): 24 min  Charges: OT General Charges $OT Visit: 1 Visit OT Treatments $Self Care/Home Management : 23-37 mins     Ihor Gully, COTA/L 08/01/2019, 9:35 AM

## 2019-08-01 NOTE — Progress Notes (Signed)
Inpatient Rehabilitation-Admissions Coordinator   [facility] has initiated insurance authorization process for possible admit.   Will follow up once determination has been made.   Please call if questions.   Jhonnie Garner, OTR/L  Rehab Admissions Coordinator  [phone] 08/01/2019 10:34 AM

## 2019-08-01 NOTE — Progress Notes (Addendum)
Reason for consult:   Subjective: No acute events overnight. Doing well this morning and able to carry a conversation with me about her medical issues. Continues to have visual hallucinations about animals being in the room at night.    ROS: negative except above  Examination  Vital signs in last 24 hours: Temp:  [98.1 F (36.7 C)-98.7 F (37.1 C)] 98.7 F (37.1 C) (08/20 0443) Pulse Rate:  [70-80] 70 (08/20 0828) Resp:  [18] 18 (08/20 0828) BP: (119-134)/(51-57) 134/56 (08/20 0443) SpO2:  [95 %-98 %] 98 % (08/20 0443)  General: lying in bed CVS: pulse-normal rate and rhythm RS: breathing comfortably Extremities: normal   Neuro: MS: Alert, oriented, follows commands CN: pupils equal and reactive,  EOMI, face symmetric, tongue midline, normal sensation over face, Motor: 5/5 strength in all 4 extremities, asterixis  Reflexes: 2+ bilaterally over patella, biceps Coordination: bilateral dysmetria  Gait: not tested  Basic Metabolic Panel: Recent Labs  Lab 07/31/19 0410 07/31/19 0913 07/31/19 1330 07/31/19 1755 08/01/19 0315  NA 120* 118* 119* 119* 124*  K 4.3 4.1 4.2 4.0 4.2  CL 91* 90* 90* 92* 97*  CO2 17* 14* 14* 16* 17*  GLUCOSE 257* 274* 254* 253* 196*  BUN 46* 50* 51* 56* 60*  CREATININE 2.47* 2.46* 2.58* 2.64* 2.24*  CALCIUM 7.9* 7.7* 7.9* 8.0* 8.0*    CBC: Recent Labs  Lab 07/26/19 1346 07/27/19 0810 07/27/19 1211 07/28/19 0431 07/29/19 0306 07/31/19 0410  WBC 14.6* 15.9* 15.2* 15.7* 17.0* 15.5*  NEUTROABS 11.1*  --   --   --   --  14.5*  HGB 9.1* 6.5* 6.3* 10.0* 10.7* 8.9*  HCT 26.8* 19.8* 18.9* 29.7* 31.3* 25.0*  MCV 95.4 98.0 96.4 92.8 91.8 89.3  PLT 253 229 205 210 227 222     Coagulation Studies: No results for input(s): LABPROT, INR in the last 72 hours.  Imaging No new imaging to review.    ASSESSMENT AND PLAN  Acute toxic/metabolic encephalopathy  Myoclonus  Clinically improving off steroids. Continues to have asterixis but no  clonus noted. I wonder if transient worsening of mental status yesterday was secondary to hyponatremia and uremia. Overall, doing much better. Low suspicion for a paraneoplastic process at this time. Would not have expected her to improve this much with only 2 days of steroids or to continue to improve off of them. Would continue to treat delirium with low dose Seroquel.   Final recommendations per neurology attending, Dr. Lorraine Lax.   Welford Roche, MD  Internal Medicine PGY-3  P [phone]   NEUROHOSPITALIST ADDENDUM Performed a face to face diagnostic evaluation.   I have reviewed the contents of history and physical exam as documented by PA/ARNP/Resident and agree with above documentation.  I have discussed and formulated the above plan as documented. Edits to the note have been made as needed.  We will continue to hold off steroids as patient has made significant improvement.  There is also been improvement in asterixis and no myoclonus seen at rest.  Paraneoplastic panel is pending.  Continue Seroquel at night first few days and may discontinue if patient mental status returns to normal self.     Karena Addison  MD [facility] [phone]   If 7pm to 7am, please call on call as listed on AMION.

## 2019-08-01 NOTE — Progress Notes (Addendum)
Subjective:  Ms. Rodier was seen today at bedside, working with OT. She states that she is feeling well today, and is happy to be working with OT. We talked about her BMP results and management. We spoke about her pending paraneoplastic serum labs. All questions and concerns were addressed.   Objective:  Vital signs in last 24 hours: Vitals:   07/31/19 2031 08/01/19 0443 08/01/19 0828 08/01/19 1040  BP: (!) 119/51 (!) 134/56  119/64  Pulse: 79 80 70 77  Resp: 18 18 18 18   Temp: 98.3 F (36.8 C) 98.7 F (37.1 C)  98 F (36.7 C)  TempSrc: Oral Oral  Oral  SpO2: 96% 98%  97%  Weight:      Height:       Physical Exam Vitals signs and nursing note reviewed.  Constitutional:      General: She is not in acute distress.    Appearance: She is obese. She is not ill-appearing, toxic-appearing or diaphoretic.     Comments: Patient participated throughout interview. She was able to express her thoughts in a clear and concise manner, with no changes in mentation.   HENT:     Head: Normocephalic and atraumatic.  Cardiovascular:     Rate and Rhythm: Normal rate and regular rhythm.     Heart sounds: No murmur. No friction rub. No gallop.   Pulmonary:     Effort: Pulmonary effort is normal. No respiratory distress.     Breath sounds: Normal breath sounds. No stridor. No wheezing or rhonchi.  Neurological:     Mental Status: She is alert.   Mental status appeared improved, no abnormal movement of extremiteis noted.  Assessment/Plan:  Principal Problem:   Encephalopathy Active Problems:   AKI (acute kidney injury) ([place])   Dehydration   AMS (altered mental status)   Hyponatremia  Hypotonic Hyponatremia: improving - Na: 124 - Osmolality: 272 - Sodium, urine, random: 29 - Creatinine, urine, random: 109.68 - Nephrology following - Continue to monitor serial BMPs for her Hyponatremia.  AKI: improving - Creatinine: 2.24 - Continue I/O Monitoring  - Continue to avoid nephrotoxic  drugs. - Continue to follow BMPs. - Nephrology following  New Onset Tremors:  - Ordered MRI brain w w/o contrast: Motion artifacts with signal abnormality within non dominant distal R vertebral artery, which may reflect slow or high grade stenosis/occlusion.  - Neurology consulted             - Discontinued Solumedrol IV, per Neurology recommendation.              - EEG suggestive of cortical dysfunction in the L more than the R temporal region which is nonspecific to etiology. No seizure or epileptiform activity noted.              - Serum paraneoplastic panel results: pending.              Altered Mental Status:  - Hold home Prednisone in the case AMS is 2/2 to steroid psychosis. - Blood cultures: NGTD 5 days. - Decreased Seroquel to 25 mg. - Delirium precautions.  Acute on Chronic Anemia:  - Hgb: 8.9 (07/31/2019)  Metastatic Ovarian Cancer:  - Continue Megace 40 mg BID.  Hypothyroidism:   - free T4: 0.59 -TSH mildly elevated, doubt playing a role in AMS or hyponatremia, will adjust home synthyroid up from 137 mcg to 131mcg.  Hyperkalemia: Resolved  Dispo: Anticipated discharge pending medical course.   Maudie Mercury, MD 08/01/2019, 1:05 PM  Pager: [phone]  Internal Medicine Attending:   I saw and examined the patient. I reviewed the resident's note and I agree with the resident's findings and plan as documented in the resident's note. Appreciate Nephrology and Neurology assistance. Of note it appears she was on prednisone PTA, I do not think this has been a long term medication, stress dose steroids were stopped 2 days ago (would expect some lingering effect yesterday) thus I do not think her hyponatermia is due to AI but would it be worthwhile to check an AM cortisol?

## 2019-08-01 NOTE — Plan of Care (Signed)
  Problem: Clinical Measurements: Goal: Diagnostic test results will improve Outcome: Progressing   Problem: Nutrition: Goal: Adequate nutrition will be maintained Outcome: Progressing   

## 2019-08-01 NOTE — Care Management Important Message (Signed)
Important Message  Patient Details  Name: Rhonda Hale MRN: [id number] Date of Birth: 12/18/1945   Medicare Important Message Given:  Yes     [name] 08/01/2019, 2:50 PM

## 2019-08-01 NOTE — Progress Notes (Signed)
Nutrition Follow-up  DOCUMENTATION CODES:   Obesity unspecified  INTERVENTION:   -D/C Prostat + Ensure MAX   Add Magic cup TID with meals, each supplement provides 290 kcal and 9 grams of protein  MVI daily  NUTRITION DIAGNOSIS:   Increased nutrient needs related to cancer and cancer related treatments as evidenced by estimated needs.  Ongoing  GOAL:   Patient will meet greater than or equal to 90% of their needs  Progressing  MONITOR:   PO intake, Supplement acceptance, Labs, I & O's, Weight trends  REASON FOR ASSESSMENT:   Malnutrition Screening Tool    ASSESSMENT:   Patient with PMH significant for metastatic ovarian cancer. Found to have PRES syndrome during last admission (8/4). Presents this admission with AMS and hyperkalemia.   RD working remotely.  Spoke with daughter via phone. Reports appetite has progressed in the last two days. Pt now showing interest in food. She consumed 25%, of eggs, 50% of sausage, and 25% of cereal this am. Meal completions vary from 0-100% for pt's last five meals. She does not like Ensure MAX or Prostat. Will try Magic Cup. Daughter to continue to encourage intake.   Admission weight: 78 kg Current weight: 76.3 kg   I/O: +570 ml since admit UOP: 1,200 ml x 24 hrs   Medications: calcitriol, megace, MVI with minerals Labs: Na 124 (L) CBG 196-274   Diet Order:   Diet Order            Diet regular Room service appropriate? Yes; Fluid consistency: Thin  Diet effective now              EDUCATION NEEDS:   Education needs have been addressed  Skin:  Skin Assessment: Skin Integrity Issues: Skin Integrity Issues:: Other (Comment) Other: MASD- R arm, back, buttocks  Last BM:  8/16  Height:   Ht Readings from Last 1 Encounters:  07/28/19 5\' 2"  (1.575 m)    Weight:   Wt Readings from Last 1 Encounters:  07/30/19 76.3 kg    Ideal Body Weight:  50 kg  BMI:  Body mass index is 30.77 kg/m.  Estimated  Nutritional Needs:   Kcal:  1750-1950 kcal  Protein:  85-100 grams  Fluid:  >/= 1.7 L/day   Mariana Single RD, LDN Clinical Nutrition Pager # - [phone]

## 2019-08-01 NOTE — Progress Notes (Signed)
[facility] Progress Note  Name: Rhonda Hale MRN: [id number] DOB: [birth year]  Chief Complaint:  Admitted with abnormal outpatient labs - hyperkalemia   Subjective:  Feels better this morning.  She feels like she is clearer mentally and her daughter agrees.  In the interim she has had limited duration normal saline at 75 for a few hours at a time.  Slow interval improvement in Na.  She has followed with endocrinology in [place] and they are managing her calcitriol.     Review of systems:  Denies shortness of breath or chest pain  Denies n/v Decreased appetite  ---------------------- Background on consult:  Rhonda Hale is a 73 y.o. female history including ovarian cancer previously treated with paclitaxel and Avastin who came into the ER after outpatient labs demonstrated hyperkalemia and worsening renal function.  Her potassium was 5.9 and creatinine was improved to 1.39 on presentation.  Hyperkalemia has resolved.  She has had altered mental status and neurology has been consulted for the same.  She had been doing better however this morning was more confused.  Her daughter is at bedside and states that she has been seeing things in the room but has been oriented.  She was seen recently during prior admission for AKI which was felt ischemic insults from hypotension at that time.  Note that she had been on D5W which was discontinued this morning upon receipt of the abnormal labs per the team.  She has also been on an SSRI and reports that she has been on this for several years.  Appetite has not been great.  She ate less last night.  She has not had any nausea or vomiting.  Her daughter has noticed decreased urine output as they have been changing her less frequently.  She is incontinent of urine at baseline.  She does not use NSAIDs for at least the past 6 months and only used intermittently prior to that.  Noted to have developed AKI with Cr 2.46 today.    Intake/Output Summary  (Last 24 hours) at 08/01/2019 0808 Last data filed at 08/01/2019 0443 Gross per 24 hour  Intake 301.63 ml  Output 1200 ml  Net -898.37 ml    Vitals:  Vitals:   07/31/19 0954 07/31/19 1649 07/31/19 2031 08/01/19 0443  BP: 103/80 (!) 130/57 (!) 119/51 (!) 134/56  Pulse: 76 77 79 80  Resp: 18 18 18 18   Temp: (!) 97.5 F (36.4 C) 98.1 F (36.7 C) 98.3 F (36.8 C) 98.7 F (37.1 C)  TempSrc: Oral Oral Oral Oral  SpO2: 96% 95% 96% 98%  Weight:      Height:         Physical Exam:  General adult female in bed in no acute distress HEENT normocephalic atraumatic extraocular movements intact sclera anicteric Neck supple trachea midline Lungs clear to auscultation bilaterally normal work of breathing at rest  Heart regular rate and rhythm no rubs or gallops appreciated Abdomen soft nontender nondistended Extremities no edema  Psych normal mood and affect Neuro - more alert; she is oriented x 3; conversant  Medications reviewed   Labs:  BMP Latest Ref Rng & Units 08/01/2019 07/31/2019 07/31/2019  Glucose 70 - 99 mg/dL 196(H) 253(H) 254(H)  BUN 8 - 23 mg/dL 60(H) 56(H) 51(H)  Creatinine 0.44 - 1.00 mg/dL 2.24(H) 2.64(H) 2.58(H)  BUN/Creat Ratio 12 - 28 - - -  Sodium 135 - 145 mmol/L 124(L) 119(LL) 119(LL)  Potassium 3.5 - 5.1 mmol/L 4.2 4.0 4.2  Chloride 98 - 111 mmol/L 97(L) 92(L) 90(L)  CO2 22 - 32 mmol/L 17(L) 16(L) 14(L)  Calcium 8.9 - 10.3 mg/dL 8.0(L) 8.0(L) 7.9(L)     Assessment/Plan:   # Hyponatremia - Secondary in part to hypotonic fluids given the acute worsening after the fluids.  Cannot r/o in part paraneoplastic however also on SSRI which is now off - Slowly improving - corrects to 126  - no indication for 3% - will follow the repeat sodium this AM to guide decision re: additional normal saline   - Remain off of SSRI   # AKI  - Has had ischemic insults/ATN with mild hypotension, pre-renal insults, as well as retention - Continue foley catheter  - Hx of recent  AKI which was felt in part 2/2 ATN with hypotension   # AMS - optimize hyponatremia as above   # Hyperglycemia - per primary team   # CKD stage III - baseline Cr is 1.1 - 1.3  # Ovarian cancer - note s/p paclitaxel and avastin   # Hyperkalemia resolved   # Metabolic acidosis - on sodium bicarbonate   # Bone mineral metabolism  - continue home calcitriol - managed by endocrinology in [place] (Dr. Anderson Malta)   Claudia Desanctis, MD 08/01/2019 8:08 AM

## 2019-08-02 ENCOUNTER — Other Ambulatory Visit: Payer: Self-pay

## 2019-08-02 ENCOUNTER — Inpatient Hospital Stay (HOSPITAL_COMMUNITY)
Admission: RE | Admit: 2019-08-02 | Discharge: 2019-08-10 | DRG: 092 | Disposition: A | Discharge status: Home Health | Payer: Medicare Other | Source: Intra-hospital | Attending: Physical Medicine & Rehabilitation | Admitting: Physical Medicine & Rehabilitation

## 2019-08-02 ENCOUNTER — Encounter (HOSPITAL_COMMUNITY): Payer: Self-pay | Admitting: *Deleted

## 2019-08-02 DIAGNOSIS — Z86711 Personal history of pulmonary embolism: Secondary | ICD-10-CM

## 2019-08-02 DIAGNOSIS — D638 Anemia in other chronic diseases classified elsewhere: Secondary | ICD-10-CM | POA: Diagnosis not present

## 2019-08-02 DIAGNOSIS — G92 Toxic encephalopathy: Secondary | ICD-10-CM | POA: Diagnosis present

## 2019-08-02 DIAGNOSIS — C569 Malignant neoplasm of unspecified ovary: Secondary | ICD-10-CM | POA: Diagnosis present

## 2019-08-02 DIAGNOSIS — R4182 Altered mental status, unspecified: Secondary | ICD-10-CM | POA: Diagnosis present

## 2019-08-02 DIAGNOSIS — M542 Cervicalgia: Secondary | ICD-10-CM | POA: Diagnosis not present

## 2019-08-02 DIAGNOSIS — G9340 Encephalopathy, unspecified: Secondary | ICD-10-CM

## 2019-08-02 DIAGNOSIS — Z8543 Personal history of malignant neoplasm of ovary: Secondary | ICD-10-CM

## 2019-08-02 DIAGNOSIS — E890 Postprocedural hypothyroidism: Secondary | ICD-10-CM | POA: Diagnosis present

## 2019-08-02 DIAGNOSIS — I1310 Hypertensive heart and chronic kidney disease without heart failure, with stage 1 through stage 4 chronic kidney disease, or unspecified chronic kidney disease: Secondary | ICD-10-CM | POA: Diagnosis present

## 2019-08-02 DIAGNOSIS — N183 Chronic kidney disease, stage 3 (moderate): Secondary | ICD-10-CM | POA: Diagnosis present

## 2019-08-02 DIAGNOSIS — E872 Acidosis: Secondary | ICD-10-CM | POA: Diagnosis present

## 2019-08-02 DIAGNOSIS — D631 Anemia in chronic kidney disease: Secondary | ICD-10-CM | POA: Diagnosis present

## 2019-08-02 DIAGNOSIS — G928 Other toxic encephalopathy: Secondary | ICD-10-CM | POA: Diagnosis present

## 2019-08-02 DIAGNOSIS — Z7901 Long term (current) use of anticoagulants: Secondary | ICD-10-CM | POA: Diagnosis not present

## 2019-08-02 DIAGNOSIS — Z9221 Personal history of antineoplastic chemotherapy: Secondary | ICD-10-CM

## 2019-08-02 DIAGNOSIS — D509 Iron deficiency anemia, unspecified: Secondary | ICD-10-CM | POA: Diagnosis present

## 2019-08-02 DIAGNOSIS — I10 Essential (primary) hypertension: Secondary | ICD-10-CM

## 2019-08-02 DIAGNOSIS — E271 Primary adrenocortical insufficiency: Secondary | ICD-10-CM | POA: Diagnosis present

## 2019-08-02 DIAGNOSIS — Z79899 Other long term (current) drug therapy: Secondary | ICD-10-CM

## 2019-08-02 DIAGNOSIS — Z888 Allergy status to other drugs, medicaments and biological substances status: Secondary | ICD-10-CM | POA: Diagnosis not present

## 2019-08-02 DIAGNOSIS — N189 Chronic kidney disease, unspecified: Secondary | ICD-10-CM

## 2019-08-02 DIAGNOSIS — N289 Disorder of kidney and ureter, unspecified: Secondary | ICD-10-CM

## 2019-08-02 DIAGNOSIS — Z79890 Hormone replacement therapy (postmenopausal): Secondary | ICD-10-CM | POA: Diagnosis not present

## 2019-08-02 DIAGNOSIS — Z90710 Acquired absence of both cervix and uterus: Secondary | ICD-10-CM

## 2019-08-02 DIAGNOSIS — E871 Hypo-osmolality and hyponatremia: Secondary | ICD-10-CM | POA: Diagnosis present

## 2019-08-02 HISTORY — DX: Essential (primary) hypertension: I10

## 2019-08-02 HISTORY — DX: Chronic obstructive pulmonary disease, unspecified: J44.9

## 2019-08-02 LAB — RENAL FUNCTION PANEL
Albumin: 2.6 g/dL — ABNORMAL LOW (ref 3.5–5.0)
Anion gap: 10 (ref 5–15)
BUN: 31 mg/dL — ABNORMAL HIGH (ref 8–23)
CO2: 19 mmol/L — ABNORMAL LOW (ref 22–32)
Calcium: 8.1 mg/dL — ABNORMAL LOW (ref 8.9–10.3)
Chloride: 104 mmol/L (ref 98–111)
Creatinine, Ser: 1.13 mg/dL — ABNORMAL HIGH (ref 0.44–1.00)
GFR calc Af Amer: 55 mL/min — ABNORMAL LOW (ref >60)
GFR calc non Af Amer: 48 mL/min — ABNORMAL LOW (ref >60)
Glucose, Bld: 129 mg/dL — ABNORMAL HIGH (ref 70–99)
Phosphorus: 2.3 mg/dL — ABNORMAL LOW (ref 2.5–4.6)
Potassium: 3.4 mmol/L — ABNORMAL LOW (ref 3.5–5.1)
Sodium: 133 mmol/L — ABNORMAL LOW (ref 135–145)

## 2019-08-02 LAB — CBC
HCT: 24 % — ABNORMAL LOW (ref 36.0–46.0)
Hemoglobin: 8.1 g/dL — ABNORMAL LOW (ref 12.0–15.0)
MCH: 31.2 pg (ref 26.0–34.0)
MCHC: 33.8 g/dL (ref 30.0–36.0)
MCV: 92.3 fL (ref 80.0–100.0)
Platelets: 225 10*3/uL (ref 150–400)
RBC: 2.6 MIL/uL — ABNORMAL LOW (ref 3.87–5.11)
RDW: 20.3 % — ABNORMAL HIGH (ref 11.5–15.5)
WBC: 13.6 10*3/uL — ABNORMAL HIGH (ref 4.0–10.5)
nRBC: 0.6 % — ABNORMAL HIGH (ref 0.0–0.2)

## 2019-08-02 LAB — CORTISOL-AM, BLOOD: Cortisol - AM: 2 ug/dL — ABNORMAL LOW (ref 6.7–22.6)

## 2019-08-02 LAB — GLUCOSE, CAPILLARY: Glucose-Capillary: 147 mg/dL — ABNORMAL HIGH (ref 70–99)

## 2019-08-02 MED ORDER — MEGESTROL ACETATE 40 MG PO TABS
40.0000 mg | ORAL_TABLET | Freq: Two times a day (BID) | ORAL | Status: DC
  Administered 2019-08-02 – 2019-08-10 (×16): 40 mg via ORAL
  Filled 2019-08-02 (×17): qty 1

## 2019-08-02 MED ORDER — ONDANSETRON HCL 4 MG PO TABS
4.0000 mg | ORAL_TABLET | Freq: Four times a day (QID) | ORAL | Status: DC | PRN
  Administered 2019-08-03: 4 mg via ORAL
  Filled 2019-08-02: qty 1

## 2019-08-02 MED ORDER — SORBITOL 70 % SOLN: 30.0000 mL | Freq: Every day | Status: DC | PRN

## 2019-08-02 MED ORDER — SODIUM CHLORIDE 0.9% FLUSH
10.0000 mL | Freq: Two times a day (BID) | INTRAVENOUS | Status: DC
  Administered 2019-08-05 – 2019-08-07 (×5): 10 mL

## 2019-08-02 MED ORDER — COSYNTROPIN 0.25 MG IJ SOLR
0.2500 mg | Freq: Once | INTRAMUSCULAR | Status: AC
  Administered 2019-08-03: 0.25 mg via INTRAVENOUS
  Filled 2019-08-02: qty 0.25

## 2019-08-02 MED ORDER — QUETIAPINE FUMARATE 25 MG PO TABS
25.0000 mg | ORAL_TABLET | Freq: Every day | ORAL | Status: DC
  Administered 2019-08-02 – 2019-08-09 (×8): 25 mg via ORAL
  Filled 2019-08-02 (×8): qty 1

## 2019-08-02 MED ORDER — ADULT MULTIVITAMIN W/MINERALS CH
1.0000 | ORAL_TABLET | Freq: Every day | ORAL | Status: DC
  Administered 2019-08-03 – 2019-08-10 (×8): 1 via ORAL
  Filled 2019-08-02 (×8): qty 1

## 2019-08-02 MED ORDER — APIXABAN 5 MG PO TABS
5.0000 mg | ORAL_TABLET | Freq: Two times a day (BID) | ORAL | Status: DC
  Administered 2019-08-02 – 2019-08-10 (×16): 5 mg via ORAL
  Filled 2019-08-02 (×16): qty 1

## 2019-08-02 MED ORDER — AMLODIPINE BESYLATE 10 MG PO TABS
10.0000 mg | ORAL_TABLET | Freq: Every day | ORAL | Status: DC
  Administered 2019-08-03 – 2019-08-10 (×8): 10 mg via ORAL
  Filled 2019-08-02 (×8): qty 1

## 2019-08-02 MED ORDER — SODIUM CHLORIDE 0.9% FLUSH
10.0000 mL | INTRAVENOUS | Status: DC | PRN
  Administered 2019-08-05 – 2019-08-07 (×2): 10 mL
  Filled 2019-08-02 (×2): qty 40

## 2019-08-02 MED ORDER — ENSURE ENLIVE PO LIQD
237.0000 mL | Freq: Two times a day (BID) | ORAL | Status: DC
  Administered 2019-08-05 – 2019-08-06 (×4): 237 mL via ORAL

## 2019-08-02 MED ORDER — LEVOTHYROXINE SODIUM 75 MCG PO TABS
150.0000 ug | ORAL_TABLET | Freq: Every day | ORAL | Status: DC
  Administered 2019-08-03 – 2019-08-09 (×7): 150 ug via ORAL
  Filled 2019-08-02 (×9): qty 2

## 2019-08-02 MED ORDER — POTASSIUM CHLORIDE CRYS ER 10 MEQ PO TBCR
10.0000 meq | EXTENDED_RELEASE_TABLET | Freq: Once | ORAL | Status: AC
  Administered 2019-08-02: 10 meq via ORAL
  Filled 2019-08-02: qty 1

## 2019-08-02 MED ORDER — ACETAMINOPHEN 325 MG PO TABS
650.0000 mg | ORAL_TABLET | Freq: Four times a day (QID) | ORAL | Status: DC | PRN
  Administered 2019-08-02 – 2019-08-09 (×12): 650 mg via ORAL
  Filled 2019-08-02 (×12): qty 2

## 2019-08-02 MED ORDER — LABETALOL HCL 100 MG PO TABS
50.0000 mg | ORAL_TABLET | Freq: Two times a day (BID) | ORAL | Status: DC
  Administered 2019-08-02 – 2019-08-10 (×16): 50 mg via ORAL
  Filled 2019-08-02 (×16): qty 1

## 2019-08-02 MED ORDER — CALCITRIOL 0.5 MCG PO CAPS
0.5000 ug | ORAL_CAPSULE | Freq: Every day | ORAL | Status: DC
  Administered 2019-08-03 – 2019-08-10 (×8): 0.5 ug via ORAL
  Filled 2019-08-02 (×8): qty 1

## 2019-08-02 MED ORDER — ACETAMINOPHEN 650 MG RE SUPP: 650.0000 mg | Freq: Four times a day (QID) | RECTAL | Status: DC | PRN

## 2019-08-02 MED ORDER — SODIUM BICARBONATE 650 MG PO TABS
650.0000 mg | ORAL_TABLET | Freq: Two times a day (BID) | ORAL | Status: DC
  Administered 2019-08-02: 650 mg via ORAL
  Filled 2019-08-02: qty 1

## 2019-08-02 MED ORDER — SODIUM BICARBONATE 650 MG PO TABS
650.0000 mg | ORAL_TABLET | Freq: Two times a day (BID) | ORAL | Status: DC
  Administered 2019-08-02 – 2019-08-03 (×2): 650 mg via ORAL
  Filled 2019-08-02 (×3): qty 1

## 2019-08-02 MED ORDER — UMECLIDINIUM BROMIDE 62.5 MCG/INH IN AEPB
1.0000 | INHALATION_SPRAY | Freq: Every day | RESPIRATORY_TRACT | Status: DC
  Administered 2019-08-03 – 2019-08-10 (×7): 1 via RESPIRATORY_TRACT
  Filled 2019-08-02 (×2): qty 7

## 2019-08-02 MED ORDER — ONDANSETRON HCL 4 MG/2ML IJ SOLN: 4.0000 mg | Freq: Four times a day (QID) | INTRAMUSCULAR | Status: DC | PRN

## 2019-08-02 NOTE — Progress Notes (Signed)
Patient ID: Rhonda Hale, female   DOB: [birth year], 73 y.o.   MRN: [id number] Admit to unit, oriented to rehab, medications, rehab therapy schedule and plan of care. States an understanding of information reviewed. Margarito Liner

## 2019-08-02 NOTE — Progress Notes (Signed)
Patient admitted to acute rehab. The IMTS will follow-up the results of her ACTH stim test tomorrow morning.   [name], MD IMTS PGY3  Pager: [phone]

## 2019-08-02 NOTE — Progress Notes (Signed)
Initial Nutrition Assessment  RD working remotely.  DOCUMENTATION CODES:   Not applicable  INTERVENTION:  - Magic cup TID with meals, each supplement provides 290 kcal and 9 grams of protein  - Encourage adequate PO intake  - Agree with Regular diet order  - Continue MVI with minerals daily  NUTRITION DIAGNOSIS:   Increased nutrient needs related to cancer and cancer related treatments as evidenced by estimated needs.  GOAL:   Patient will meet greater than or equal to 90% of their needs  MONITOR:   PO intake, Supplement acceptance, Labs, Weight trends, I & O's  REASON FOR ASSESSMENT:   Malnutrition Screening Tool    ASSESSMENT:   73 year old female with PMH of pulmonary emboli maintained on anticoagulation, metastatic ovarian cancer s/p hysterectomy as well as chemotherapy, microcytic anemia, CKD stage III. Pt with recent admission 07/03/19 to 07/17/19 for PRES thought to be secondary to chemotherapy induced severe HTN as well as pneumonia. Pt was discharged home with assistance of her family. Pt presented 07/26/19 with AMS, tremors, and decreased appetite. Chest x-ray showing small left pleural effusion and left lung base atelectasis or infiltrate. Neurology services suspect acute toxic metabolic encephalopathy. Pt's mental status continues to improve. Tolerating a regular diet. Pt admitted to CIR on 8/21.   Reviewed RD notes from acute admission. On 8/15, RD spoke with pt's daughter via phone call. Pt's daughter reported that pt's appetite remained poor since admission from 7/22-8/5 but that family was providing pt with food every 2 hours (PB&J, eggs, bacon, bagel, ice cream, chicken pot pie, peaches, etc). Pt's daughter reported to RD that pt was not eating full meals.  RD was able to speak with pt via phone call to room. Pt reports that her appetite has greatly improved and that she is already feeling hungry for dinner. Pt states she is looking over the menu to find what she  might like to eat and said that the cake sounded delicious. RD encouraged pt to reach out to RN to ask for a snack (graham crackers and peanut butter) since dinner is still 2 hours away.  Pt denies any N/V at this time. Pt states that her main issues were not having any appetite and food tasting salty. Pt states "when you have chemo, you feel like there is a bolus of food in your stomach all the time." Pt states that this is why she didn't want to eat.  Pt reports that her UBW is 177 lbs. Pt believes she last weighed this at the beginning of August. Pt shares that she has lost 17 lbs during the month of August due to not wanting to eat.  No CIR admission weight recorded at this time. Reviewed weight history in chart. Noted pt with a 7.4 kg (16.3 lb) weight loss from 8/5 to 8/12 (9.2% weight loss, significant for timeframe) but a 1.1 kg (2.4 lb) weight gain from 8/12 to 8/20. RD will continue to monitor trends. Unable to diagnose malnutrition at this time.  Noted during previous admissions that pt refused Ensure [place], Ensure Max, [employer], and [employer] but accepted [employer]. RD will order Magic Cups with meals.  Medications reviewed and include: calcitriol, Megace 40 mg BID, MVI with minerals, sodium bicarb 650 mg BID  Labs reviewed: sodium 133, potassium 3.4, phosphorus 2.3, hemoglobin 8.1  NUTRITION - FOCUSED PHYSICAL EXAM:  Unable to complete at this time. RD working remotely.  Diet Order:   Diet Order  Diet regular Room service appropriate? Yes; Fluid consistency: Thin  Diet effective now              EDUCATION NEEDS:   No education needs have been identified at this time  Skin:  Skin Assessment: Reviewed RN Assessment  Last BM:  08/02/19  Height:   Ht Readings from Last 1 Encounters:  07/28/19 5\' 2"  (1.575 m)    Weight:   Wt Readings from Last 1 Encounters:  08/01/19 74 kg    Ideal Body Weight:  50 kg  BMI:  There is no height or weight on file to  calculate BMI.  Estimated Nutritional Needs:   Kcal:  [id number]  Protein:  85-100 grams  Fluid:  >/= 1.7 L    Gaynell Face, MS, RD, LDN Inpatient Clinical Dietitian Pager: [phone] Weekend/After Hours: [phone]

## 2019-08-02 NOTE — PMR Pre-admission (Signed)
PMR Admission Coordinator Pre-Admission Assessment  Patient: Rhonda Hale is an 73 y.o., female MRN: [id number] DOB: 1946-11-20 Height: '5\' 2"'  (157.5 cm) Weight: 74 kg  Insurance Information HMO: yes    PPO:      PCP:      IPA:      80/20:      OTHER:  PRIMARY: U[employer] Medicare      Policy#: [id number]      Subscriber: Patient CM Name: Legrand Como      Phone#: [phone]     Fax#: [phone] Pre-Cert#: [id number]      Employer:  [employer] provided by Legrand Como for admit to CIR. Pt is approved for 7 days from day of admit (8/21-8/27). Clinical updates are due to [place] (p): [phone] (f): [phone]. Benefits:  Phone #: online     Name: [email or web address] Eff. Date: 12/12/2018 - still active     Deduct: $0      Out of Pocket Max: $3,600 ($3,600 met)      Life Max:  CIR: $295/day co-pay for days 1-5, $0/day co-pay for days 6+      SNF: $0/day co-pay for days 1-20, $160/day co-pay for days 21-43, $0/day co-pay for days 44-100; limited to 100 days/cal yr.  Outpatient: limited by medical necessity     Co-Pay: $30/visit co-pay Home Health: 100%; limited by medical necessity      Co-Pay: 0% co-insurance DME: 80%     Co-Pay: 20% co-insurance Providers:  SECONDARY: None      Policy#:       Subscriber:  CM Name:       Phone#:      Fax#:  Pre-Cert#:       Employer:  Benefits:  Phone #:      Name: Eff. Date:      Deduct:       Out of Pocket Max:      Life Max:  CIR:       SNF:  Outpatient:     Co-Pay:  Home Health:       Co-Pay:  DME:      Co-Pay:  Medicaid Application Date:       Case Manager:  Disability Application Date:      Case Worker:   The "Data Collection Information Summary" for patients in Inpatient Rehabilitation Facilities with attached "Privacy Act Normal Records" was provided and verbally reviewed with: Patient and Family  Emergency Contact Information Contact Information    Name Relation Home Work Mobile   Kohlphenson,Krista Daughter   [phone]       Current Medical History  Patient Admitting Diagnosis: Debility due to multiple medical conditions   History of Present Illness: Rhonda Hale is a 73 year old female with history of pulmonary emboli maintained on anticoagulation, bone mineral metabolism followed by endocrinology services [place] in [facility], metastatic ovarian cancer status post hysterectomy as well as chemotherapy and microcytic anemia, CKD stage III with baseline creatinine 1.1-1.3, as well as recent admission 07/03/2019 to 07/17/2019 for 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  She was discharged home with assistance of her family independent with ADLs using assistive device.  Patient was completing a course of Bactrim as well as low-dose steroids.  Presented 07/26/2019 with altered mental status, tremors as well as decrease in appetite.  Daughter reports urinary and fecal incontinence at baseline.  In the ED potassium 5.9, sodium 127, BUN 36, creatinine 1.80, COVID negative, urine culture 80,000 Staphylococcus epidermidis, WBC  14,600, hemoglobin 9.1, ammonia level 18.  Chest x-ray small left pleural effusion and left lung base atelectasis or infiltrate, cranial CT/MRI scan negative for acute changes.  No intracranial mass or hemorrhage.  No correlation seen on head CT or imaging suggesting posterior reversible encephalopathy syndrome.  Patient with noted bouts of mild clonus and jerking with EEG negative for seizure.  Follow-up neurology services suspect acute toxic metabolic encephalopathy.  Renal service follow-up for hyponatremia and hyperkalemia.  And did respond well to normal saline at 75 an hour after switch from D5W.  Her mental status continues to improve and she continues on low-dose Seroquel.  Tolerating a regular diet.  Therapy evaluations completed and patient is to be admitted for a comprehensive rehab program on 08/02/2019.    Patient's  medical record from [facility] has been reviewed by the rehabilitation admission coordinator and physician.  Past Medical History  Past Medical History:  Diagnosis Date  . Cancer [facility])     Family History   family history is not on file.  Prior Rehab/Hospitalizations Has the patient had prior rehab or hospitalizations prior to admission? Yes  Has the patient had major surgery during 100 days prior to admission? No   Current Medications  Current Facility-Administered Medications:  .  acetaminophen (TYLENOL) tablet 650 mg, 650 mg, Oral, Q6H PRN, 650 mg at 07/28/19 1057 **OR** acetaminophen (TYLENOL) suppository 650 mg, 650 mg, Rectal, Q6H PRN, Guadlupe Spanish B, MD .  amLODipine (NORVASC) tablet 10 mg, 10 mg, Oral, Daily, Katherine Roan, MD, 10 mg at 08/02/19 0856 .  apixaban (ELIQUIS) tablet 5 mg, 5 mg, Oral, BID, Maudie Mercury, MD, 5 mg at 08/02/19 0856 .  calcitRIOL (ROCALTROL) capsule 0.5 mcg, 0.5 mcg, Oral, Daily, Katherine Roan, MD, 0.5 mcg at 08/02/19 0855 .  labetalol (NORMODYNE) tablet 50 mg, 50 mg, Oral, BID, Katherine Roan, MD, 50 mg at 08/02/19 0855 .  levothyroxine (SYNTHROID) tablet 150 mcg, 150 mcg, Oral, QAC breakfast, Lucious Groves, DO, 150 mcg at 08/02/19 4315 .  megestrol (MEGACE) tablet 40 mg, 40 mg, Oral, BID, Katherine Roan, MD, 40 mg at 08/02/19 0856 .  multivitamin with minerals tablet 1 tablet, 1 tablet, Oral, Daily, Sid Falcon, MD, 1 tablet at 08/02/19 0855 .  ondansetron (ZOFRAN) tablet 4 mg, 4 mg, Oral, Q6H PRN **OR** ondansetron (ZOFRAN) injection 4 mg, 4 mg, Intravenous, Q6H PRN, Katherine Roan, MD .  QUEtiapine (SEROQUEL) tablet 25 mg, 25 mg, Oral, QHS, Santos-Sanchez, Idalys, MD, 25 mg at 08/01/19 2145 .  sodium bicarbonate tablet 650 mg, 650 mg, Oral, BID, Claudia Desanctis, MD, 650 mg at 08/02/19 0855 .  sodium chloride flush (NS) 0.9 % injection 10-40 mL, 10-40 mL, Intracatheter, PRN, Gilles Chiquito B, MD, 10 mL  at 07/27/19 2105 .  umeclidinium bromide (INCRUSE ELLIPTA) 62.5 MCG/INH 1 puff, 1 puff, Inhalation, Daily, Sid Falcon, MD, 1 puff at 08/02/19 4008  Patients Current Diet:  Diet Order            Diet - low sodium heart healthy        Diet regular Room service appropriate? Yes; Fluid consistency: Thin  Diet effective now              Precautions / Restrictions Precautions Precautions: Fall Restrictions Weight Bearing Restrictions: No Other Position/Activity Restrictions: pts daughter present 24/7 and is a nurse   Has the patient had 2 or more falls or a fall with injury  in the past year? Yes  Prior Activity Level Community (5-7x/wk): active til COVID; Independent PTA with AD use. Does not drive or work currently, vision issues   Prior Functional Level Self Care: Did the patient need help bathing, dressing, using the toilet or eating? Independent  Indoor Mobility: Did the patient need assistance with walking from room to room (with or without device)? Independent  Stairs: Did the patient need assistance with internal or external stairs (with or without device)? Independent  Functional Cognition: Did the patient need help planning regular tasks such as shopping or remembering to take medications? [place] / Equipment Home Assistive Devices/Equipment: Bedside commode/3-in-1, Wheelchair, Environmental consultant (specify type) Home Equipment: Walker - 2 wheels, Shower seat  Prior Device Use: Indicate devices/aids used by the patient prior to current illness, exacerbation or injury? Walker  Current Functional Level Cognition  Arousal/Alertness: Awake/alert Overall Cognitive Status: Impaired/Different from baseline Orientation Level: Oriented to person, Oriented to place, Oriented to situation, Oriented to time Following Commands: Follows one step commands inconsistently, Follows one step commands with increased time Safety/Judgement: Decreased awareness of  safety, Decreased awareness of deficits General Comments: pt with poor safety awareness and decreaed insight into deficits. pt impulsive during transfer and required MOD verbal cues for safety. Attention: Sustained Sustained Attention: Impaired Sustained Attention Impairment: Verbal basic Memory: Impaired Memory Impairment: Retrieval deficit Awareness: Impaired Awareness Impairment: Intellectual impairment Problem Solving: Impaired Problem Solving Impairment: Verbal basic Safety/Judgment: Impaired    Extremity Assessment (includes Sensation/Coordination)  Upper Extremity Assessment: Difficult to assess due to impaired cognition  Lower Extremity Assessment: Difficult to assess due to impaired cognition    ADLs  Overall ADL's : Needs assistance/impaired Eating/Feeding: Minimal assistance Eating/Feeding Details (indicate cue type and reason): daughter stated she was no longer using AE and eating with MIN A now Upper Body Dressing : Minimal assistance Upper Body Dressing Details (indicate cue type and reason): verbal cues for sequencing of donning hospital gown Lower Body Dressing: Moderate assistance Lower Body Dressing Details (indicate cue type and reason): able to complete figure four but unable to don sock d/t decreaed sitting balance- able to pull up sock with MOD external support Toilet Transfer: Minimal assistance, Min guard, Stand-pivot, RW Toilet Transfer Details (indicate cue type and reason): standpivot from EOB to recliner- cues to reach back when sitting and to not pull up on RW Functional mobility during ADLs: Min guard, Minimal assistance General ADL Comments: requries verbal cues for sequencing of tasks and safety    Mobility  Overal bed mobility: Needs Assistance Bed Mobility: Supine to Sit, Sit to Supine Supine to sit: Min guard Sit to supine: Min assist General bed mobility comments: pt was able to get to EOB with MIN guard for safety, using bed rails with HOB  elevated. Cues to get feet on floor before pt attempt to stand    Transfers  Overall transfer level: Needs assistance Equipment used: Rolling walker (2 wheeled) Transfers: Sit to/from Stand Sit to Stand: Min assist, From elevated surface Stand pivot transfers: Min assist General transfer comment: Pt min A for transfer and sit>stand. most assist needed for safety awareness and hand placement during functional transfers    Ambulation / Gait / Stairs / Wheelchair Mobility  Ambulation/Gait Ambulation/Gait assistance: Min assist, Mod assist Gait Distance (Feet): 10 Feet(3+3+4) Assistive device: Rolling walker (2 wheeled), 1 person hand held assist Gait Pattern/deviations: Step-to pattern General Gait Details: sidestepping on side of bed with dense cues for pt to keep walker  close and to sidestep alternately with walker movement Gait velocity: Decreased Gait velocity interpretation: <1.8 ft/sec, indicate of risk for recurrent falls    Posture / Balance Dynamic Sitting Balance Sitting balance - Comments: pt unable to don sock from EOB d/t poor sitting balance Balance Overall balance assessment: Needs assistance Sitting-balance support: Feet supported, Single extremity supported Sitting balance-Leahy Scale: Poor Sitting balance - Comments: pt unable to don sock from EOB d/t poor sitting balance Standing balance support: Bilateral upper extremity supported, During functional activity Standing balance-Leahy Scale: Poor Standing balance comment: Reliant on BUE support for stability.     Special needs/care consideration BiPAP/CPAP : no CPM : no Continuous Drip IV : no Dialysis : no        Days : no Life Vest : no Oxygen : no Special Bed : no Trach Size : no Wound Vac (area) : no      Location : no Skin: *left knee abrasion, *ecchymosis to arm and back, right and left; *excoriated area to mid chest, *MASD bilateral buttocks                         Bowel mgmt: incontinence, last BM  08/01/2019 Bladder mgmt: daughter reports some incontinence at baseline? External catheter in place Diabetic mgmt: no Behavioral consideration : delirium precautions Chemo/radiation : per daughter, this chemo/radiation is on hold.    Previous Home Environment (from acute therapy documentation) Living Arrangements: Children  Lives With: Daughter Available Help at Discharge: Family, Available 24 hours/day Type of Home: House Home Layout: Two level, 1/2 bath on main level Home Access: Level entry Bathroom Shower/Tub: Chiropodist: Standard Home Care Services: Yes Type of Home Care Services: Home PT, [place] (if known): [facility] Additional Comments: Pt living with daughter while undergoing chemo.  Discharge Living Setting Plans for Discharge Living Setting: Patient's home, House, Lives with (comment)(lives with daugther and daughter's spouse) Type of Home at Discharge: House Discharge Home Layout: Two level, Able to live on main level with bedroom/bathroom Alternate Level Stairs-Rails: Right Alternate Level Stairs-Number of Steps: 15 Discharge Home Access: Stairs to enter Entrance Stairs-Rails: None Entrance Stairs-Number of Steps: 1 Discharge Bathroom Shower/Tub: Other (comment)(only half bath downstairs; walk in shower upstairs) Discharge Bathroom Toilet: Standard Discharge Bathroom Accessibility: Yes How Accessible: Accessible via walker Does the patient have any problems obtaining your medications?: No  Social/Family/Support Systems Patient Roles: Other (Comment)(lives with very supportive daugther and her family) Contact Information: daughter Dorice Lamas): [phone] Anticipated Caregiver: daughter and daughter's spouse Anticipated Caregiver's Contact Information: see above Ability/Limitations of Caregiver: Min A  Caregiver Availability: 24/7 Discharge Plan Discussed with Primary Caregiver: Yes Is Caregiver In Agreement with  Plan?: Yes Does Caregiver/Family have Issues with Lodging/Transportation while Pt is in Rehab?: No  *Designated visitor: Daughter Daleen Snook.   Goals/Additional Needs Patient/Family Goal for Rehab: PT/OT: Mod I/Supervision; SLP: Mod I/Supervision Expected length of stay: 5-7 days Cultural Considerations: Christian Dietary Needs: regular diet, thin liquids Equipment Needs: TBD Pt/Family Agrees to Admission and willing to participate: Yes Program Orientation Provided & Reviewed with Pt/Caregiver Including Roles  & Responsibilities: Yes(with pt and daughter)  Barriers to Discharge: Home environment access/layout, Pending chemo/radiation  Barriers to Discharge Comments: one step to enter; 1/2 bath only on first floor; per daughter, chemo/radiation on hold   Decrease burden of Care through IP rehab admission: NA  Possible need for SNF placement upon discharge: Not anticipated; pt has great social support  from family. Pt lives with daughter and her family with are present 24/7 as daughter works from home.   Patient Condition: I have reviewed medical records from [facility], spoken with RN, and patient and daughter. I met with patient at the bedside for inpatient rehabilitation assessment.  Patient will benefit from ongoing PT, OT and SLP, can actively participate in 3 hours of therapy a day 5 days of the week, and can make measurable gains during the admission.  Patient will also benefit from the coordinated team approach during an Inpatient Acute Rehabilitation admission.  The patient will receive intensive therapy as well as Rehabilitation physician, nursing, social worker, and care management interventions.  Due to bladder management, bowel management, safety, skin/wound care, disease management, medication administration, pain management and patient education the patient requires 24 hour a day rehabilitation nursing.  The patient is currently Min/Mod A with mobility and basic ADLs.   Discharge setting and therapy post discharge at home with home health is anticipated.  Patient has agreed to participate in the Acute Inpatient Rehabilitation Program and will admit 08/02/2019.  Preadmission Screen Completed By:  Jhonnie Garner, 08/02/2019 10:51 AM ______________________________________________________________________   Discussed status with Dr. Naaman Plummer on 08/02/2019 at 10:51AM and received approval for admission today.  Admission Coordinator:  Jhonnie Garner, OT, time 10:51AM/Date 08/02/2019   Assessment/Plan: Diagnosis: debility d/t multiple medical issues, toxic encephalopathy 1. Does the need for close, 24 hr/day Medical supervision in concert with the patient's rehab needs make it unreasonable for this patient to be served in a less intensive setting? Yes 2. Co-Morbidities requiring supervision/potential complications: PE, ovarian ca, CKD III, hx PRES,  3. Due to bladder management, bowel management, safety, skin/wound care, disease management, medication administration, pain management and patient education, does the patient require 24 hr/day rehab nursing? Yes 4. Does the patient require coordinated care of a physician, rehab nurse, PT (1-2 hrs/day, 5 days/week), OT (1-2 hrs/day, 5 days/week) and SLP (1-2 hrs/day, 5 days/week) to address physical and functional deficits in the context of the above medical diagnosis(es)? Yes Addressing deficits in the following areas: balance, endurance, locomotion, strength, transferring, bowel/bladder control, bathing, dressing, feeding, grooming, toileting, cognition and psychosocial support 5. Can the patient actively participate in an intensive therapy program of at least 3 hrs of therapy 5 days a week? Yes 6. The potential for patient to make measurable gains while on inpatient rehab is excellent 7. Anticipated functional outcomes upon discharge from inpatients are: modified independent and supervision PT, modified independent and supervision OT,  modified independent and supervision SLP 8. Estimated rehab length of stay to reach the above functional goals is: 5-8 days 9. Anticipated D/C setting: Home 10. Anticipated post D/C treatments: [place] therapy 11. Overall Rehab/Functional Prognosis: excellent  MD Signature: Meredith Staggers, MD, [facility] 08/02/2019

## 2019-08-02 NOTE — Progress Notes (Signed)
Inpatient Rehabilitation  Patient information reviewed and entered into eRehab system by Scott Fix M. Channon Ambrosini, M.A., CCC/SLP, PPS Coordinator.  Information including medical coding, functional ability and quality indicators will be reviewed and updated through discharge.    

## 2019-08-02 NOTE — Progress Notes (Signed)
Patient transferred to 4W to inpatient rehab. Belongings taken with patient and daughter was called.   Farley LyRN

## 2019-08-02 NOTE — Progress Notes (Addendum)
Meredith Staggers, MD  Physician  Physical Medicine and Rehabilitation  PMR Pre-admission  Signed  Date of Service:  08/02/2019 9:32 AM      Related encounter: ED to Hosp-Admission (Discharged) from 07/26/2019 in [facility] [place]         PMR Admission Coordinator Pre-Admission Assessment  Patient: Rhonda Hale is an 73 y.o., female MRN: [id number] DOB: [birth year] Height: _0  (157.5 cm) Weight: 74 kg  Insurance Information HMO: yes    PPO:      PCP:      IPA:      80/20:      OTHER:  PRIMARY: [employer] Medicare      Policy#: [id number]      Subscriber: Patient CM Name: Legrand Como      Phone#: [phone]     Fax#: [phone] Pre-Cert#: [id number]      Employer:  [employer]auf[employer] provided by Legrand Como for admit to CIR. Pt is approved for 7 days from day of admit (8/21-8/27). Clinical updates are due to [place] (p): [phone] (f): [phone]. Benefits:  Phone #: online     Name: [email or web address] Eff. Date: 12/12/2018 - still active     Deduct: $0      Out of Pocket Max: $3,600 ($3,600 met)      Life Max:  CIR: $295/day co-pay for days 1-5, $0/day co-pay for days 6+      SNF: $0/day co-pay for days 1-20, $160/day co-pay for days 21-43, $0/day co-pay for days 44-100; limited to 100 days/cal yr.  Outpatient: limited by medical necessity     Co-Pay: $30/visit co-pay Home Health: 100%; limited by medical necessity      Co-Pay: 0% co-insurance DME: 80%     Co-Pay: 20% co-insurance Providers:  SECONDARY: None      Policy#:       Subscriber:  CM Name:       Phone#:      Fax#:  Pre-Cert#:       Employer:  Benefits:  Phone #:      Name: Eff. Date:      Deduct:       Out of Pocket Max:      Life Max:  CIR:       SNF:  Outpatient:     Co-Pay:  Home Health:       Co-Pay:  DME:      Co-Pay:  Medicaid Application Date:       Case Manager:  Disability Application Date:      Case Worker:   The Data Collection Information Summary for patients in Inpatient  Rehabilitation Facilities with attached Privacy Act [place] Records was provided and verbally reviewed with: Patient and Family  Emergency Contact Information         Contact Information    Name Relation Home Work [place] Daughter   [phone]      Current Medical History  Patient Admitting Diagnosis: Debility due to multiple medical conditions   History of Present Illness: Rhonda Hale is a 73 year old female with history of pulmonary emboli maintained on anticoagulation, bone mineral metabolism followed by endocrinology services [place], metastatic ovarian cancer status post hysterectomy as well as chemotherapy and microcytic anemia, CKD stage III with baseline creatinine 1.1-1.3, as well as recent admission 07/03/2019 to 07/17/2019 for 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 She was discharged home with assistance  of her family independent with ADLs using assistive device. Patient was completing a course of Bactrim as well as low-dose steroids. Presented 07/26/2019 with altered mental status, tremors as well as decrease in appetite. Daughter reports urinary and fecal incontinence at baseline. In the ED potassium 5.9, sodium 127, BUN 36, creatinine 1.80, COVID negative, urine culture 80,000 Staphylococcus epidermidis, WBC 14,600, hemoglobin 9.1, ammonia level 18. Chest x-ray small left pleural effusion and left lung base atelectasis or infiltrate, cranial CT/MRI scan negative for acute changes. No intracranial mass or hemorrhage. No correlation seen on head CT or imaging suggesting posterior reversible encephalopathy syndrome. Patient with noted bouts of mild clonus and jerking with EEG negative for seizure. Follow-up neurology services suspect acute toxic metabolic encephalopathy. Renal service follow-up for hyponatremia and hyperkalemia. And did  respond well to normal saline at 75 an hour after switch from D5W. Her mental status continues to improve and she continues on low-dose Seroquel. Tolerating a regular diet. Therapy evaluations completed and patient is to be admitted for a comprehensive rehab program on 08/02/2019.    Patient's medical record from [facility] has been reviewed by the rehabilitation admission coordinator and physician.  Past Medical History      Past Medical History:  Diagnosis Date   Cancer [facility])     Family History   family history is not on file.  Prior Rehab/Hospitalizations Has the patient had prior rehab or hospitalizations prior to admission? Yes  Has the patient had major surgery during 100 days prior to admission? No              Current Medications  Current Facility-Administered Medications:    acetaminophen (TYLENOL) tablet 650 mg, 650 mg, Oral, Q6H PRN, 650 mg at 07/28/19 1057 **OR** acetaminophen (TYLENOL) suppository 650 mg, 650 mg, Rectal, Q6H PRN, Katherine Roan, MD   amLODipine (NORVASC) tablet 10 mg, 10 mg, Oral, Daily, Winfrey, William B, MD, 10 mg at 08/02/19 0856   apixaban (ELIQUIS) tablet 5 mg, 5 mg, Oral, BID, Maudie Mercury, MD, 5 mg at 08/02/19 6226   calcitRIOL (ROCALTROL) capsule 0.5 mcg, 0.5 mcg, Oral, Daily, Guadlupe Spanish B, MD, 0.5 mcg at 08/02/19 0855   labetalol (NORMODYNE) tablet 50 mg, 50 mg, Oral, BID, Katherine Roan, MD, 50 mg at 08/02/19 3335   levothyroxine (SYNTHROID) tablet 150 mcg, 150 mcg, Oral, QAC breakfast, Lucious Groves, DO, 150 mcg at 08/02/19 4562   megestrol (MEGACE) tablet 40 mg, 40 mg, Oral, BID, Katherine Roan, MD, 40 mg at 08/02/19 5638   multivitamin with minerals tablet 1 tablet, 1 tablet, Oral, Daily, Gilles Chiquito B, MD, 1 tablet at 08/02/19 0855   ondansetron (ZOFRAN) tablet 4 mg, 4 mg, Oral, Q6H PRN **OR** ondansetron (ZOFRAN) injection 4 mg, 4 mg, Intravenous, Q6H PRN, Katherine Roan,  MD   QUEtiapine (SEROQUEL) tablet 25 mg, 25 mg, Oral, QHS, Santos-Sanchez, Idalys, MD, 25 mg at 08/01/19 2145   sodium bicarbonate tablet 650 mg, 650 mg, Oral, BID, Harrie Jeans C, MD, 650 mg at 08/02/19 0855   sodium chloride flush (NS) 0.9 % injection 10-40 mL, 10-40 mL, Intracatheter, PRN, Gilles Chiquito B, MD, 10 mL at 07/27/19 2105   umeclidinium bromide (INCRUSE ELLIPTA) 62.5 MCG/INH 1 puff, 1 puff, Inhalation, Daily, Sid Falcon, MD, 1 puff at 08/02/19 9373  Patients Current Diet:     Diet Order                  Diet -  low sodium heart healthy         Diet regular Room service appropriate? Yes; Fluid consistency: Thin  Diet effective now               Precautions / Restrictions Precautions Precautions: Fall Restrictions Weight Bearing Restrictions: No Other Position/Activity Restrictions: pts daughter present 24/7 and is a nurse   Has the patient had 2 or more falls or a fall with injury in the past year? Yes  Prior Activity Level Community (5-7x/wk): active til COVID; Independent PTA with AD use. Does not drive or work currently, vision issues   Prior Functional Level Self Care: Did the patient need help bathing, dressing, using the toilet or eating? Independent  Indoor Mobility: Did the patient need assistance with walking from room to room (with or without device)? Independent  Stairs: Did the patient need assistance with internal or external stairs (with or without device)? Independent  Functional Cognition: Did the patient need help planning regular tasks such as shopping or remembering to take medications? [place] / Equipment Home Assistive Devices/Equipment: Bedside commode/3-in-1, Wheelchair, Environmental consultant (specify type) Home Equipment: Walker - 2 wheels, Shower seat  Prior Device Use: Indicate devices/aids used by the patient prior to current illness, exacerbation or injury? Walker  Current Functional  Level Cognition  Arousal/Alertness: Awake/alert Overall Cognitive Status: Impaired/Different from baseline Orientation Level: Oriented to person, Oriented to place, Oriented to situation, Oriented to time Following Commands: Follows one step commands inconsistently, Follows one step commands with increased time Safety/Judgement: Decreased awareness of safety, Decreased awareness of deficits General Comments: pt with poor safety awareness and decreaed insight into deficits. pt impulsive during transfer and required MOD verbal cues for safety. Attention: Sustained Sustained Attention: Impaired Sustained Attention Impairment: Verbal basic Memory: Impaired Memory Impairment: Retrieval deficit Awareness: Impaired Awareness Impairment: Intellectual impairment Problem Solving: Impaired Problem Solving Impairment: Verbal basic Safety/Judgment: Impaired    Extremity Assessment (includes Sensation/Coordination)  Upper Extremity Assessment: Difficult to assess due to impaired cognition  Lower Extremity Assessment: Difficult to assess due to impaired cognition    ADLs  Overall ADL's : Needs assistance/impaired Eating/Feeding: Minimal assistance Eating/Feeding Details (indicate cue type and reason): daughter stated she was no longer using AE and eating with MIN A now Upper Body Dressing : Minimal assistance Upper Body Dressing Details (indicate cue type and reason): verbal cues for sequencing of donning hospital gown Lower Body Dressing: Moderate assistance Lower Body Dressing Details (indicate cue type and reason): able to complete figure four but unable to don sock d/t decreaed sitting balance- able to pull up sock with MOD external support Toilet Transfer: Minimal assistance, Min guard, Stand-pivot, RW Toilet Transfer Details (indicate cue type and reason): standpivot from EOB to recliner- cues to reach back when sitting and to not pull up on RW Functional mobility during ADLs: Min guard,  Minimal assistance General ADL Comments: requries verbal cues for sequencing of tasks and safety    Mobility  Overal bed mobility: Needs Assistance Bed Mobility: Supine to Sit, Sit to Supine Supine to sit: Min guard Sit to supine: Min assist General bed mobility comments: pt was able to get to EOB with MIN guard for safety, using bed rails with HOB elevated. Cues to get feet on floor before pt attempt to stand    Transfers  Overall transfer level: Needs assistance Equipment used: Rolling walker (2 wheeled) Transfers: Sit to/from Stand Sit to Stand: Min assist, From elevated surface Stand pivot transfers: Min assist General  transfer comment: Pt min A for transfer and sit>stand. most assist needed for safety awareness and hand placement during functional transfers    Ambulation / Gait / Stairs / Wheelchair Mobility  Ambulation/Gait Ambulation/Gait assistance: Min assist, Mod assist Gait Distance (Feet): 10 Feet(3+3+4) Assistive device: Rolling walker (2 wheeled), 1 person hand held assist Gait Pattern/deviations: Step-to pattern General Gait Details: sidestepping on side of bed with dense cues for pt to keep walker close and to sidestep alternately with walker movement Gait velocity: Decreased Gait velocity interpretation: <1.8 ft/sec, indicate of risk for recurrent falls    Posture / Balance Dynamic Sitting Balance Sitting balance - Comments: pt unable to don sock from EOB d/t poor sitting balance Balance Overall balance assessment: Needs assistance Sitting-balance support: Feet supported, Single extremity supported Sitting balance-Leahy Scale: Poor Sitting balance - Comments: pt unable to don sock from EOB d/t poor sitting balance Standing balance support: Bilateral upper extremity supported, During functional activity Standing balance-Leahy Scale: Poor Standing balance comment: Reliant on BUE support for stability.     Special needs/care consideration BiPAP/CPAP :  no CPM : no Continuous Drip IV : no Dialysis : no        Days : no Life Vest : no Oxygen : no Special Bed : no Trach Size : no Wound Vac (area) : no      Location : no Skin: *left knee abrasion, *ecchymosis to arm and back, right and left; *excoriated area to mid chest, *MASD bilateral buttocks                         Bowel mgmt: incontinence, last BM 08/01/2019 Bladder mgmt: daughter reports some incontinence at baseline? External catheter in place Diabetic mgmt: no Behavioral consideration : delirium precautions Chemo/radiation : per daughter, this chemo/radiation is on hold.    Previous Home Environment (from acute therapy documentation) Living Arrangements: Children  Lives With: Daughter Available Help at Discharge: Family, Available 24 hours/day Type of Home: House Home Layout: Two level, 1/2 bath on main level Home Access: Level entry Bathroom Shower/Tub: Chiropodist: Standard Home Care Services: Yes Type of Home Care Services: Home PT, [place] (if known): [facility] Additional Comments: Pt living with daughter while undergoing chemo.  Discharge Living Setting Plans for Discharge Living Setting: Patient's home, House, Lives with (comment)(lives with daugther and daughter's spouse) Type of Home at Discharge: House Discharge Home Layout: Two level, Able to live on main level with bedroom/bathroom Alternate Level Stairs-Rails: Right Alternate Level Stairs-Number of Steps: 15 Discharge Home Access: Stairs to enter Entrance Stairs-Rails: None Entrance Stairs-Number of Steps: 1 Discharge Bathroom Shower/Tub: Other (comment)(only half bath downstairs; walk in shower upstairs) Discharge Bathroom Toilet: Standard Discharge Bathroom Accessibility: Yes How Accessible: Accessible via walker Does the patient have any problems obtaining your medications?: No  Social/Family/Support Systems Patient Roles: Other (Comment)(lives with very  supportive daugther and her family) Contact Information: daughter Dorice Lamas): [phone] Anticipated Caregiver: daughter and daughter's spouse Anticipated Caregiver's Contact Information: see above Ability/Limitations of Caregiver: Min A  Caregiver Availability: 24/7 Discharge Plan Discussed with Primary Caregiver: Yes Is Caregiver In Agreement with Plan?: Yes Does Caregiver/Family have Issues with Lodging/Transportation while Pt is in Rehab?: No  *Designated visitor: Daughter Daleen Snook.   Goals/Additional Needs Patient/Family Goal for Rehab: PT/OT: Mod I/Supervision; SLP: Mod I/Supervision Expected length of stay: 5-7 days Cultural Considerations: Christian Dietary Needs: regular diet, thin liquids Equipment Needs: TBD Pt/Family Agrees to Admission  and willing to participate: Yes Program Orientation Provided & Reviewed with Pt/Caregiver Including Roles  & Responsibilities: Yes(with pt and daughter)  Barriers to Discharge: Home environment access/layout, Pending chemo/radiation  Barriers to Discharge Comments: one step to enter; 1/2 bath only on first floor; per daughter, chemo/radiation on hold   Decrease burden of Care through IP rehab admission: NA  Possible need for SNF placement upon discharge: Not anticipated; pt has great social support from family. Pt lives with daughter and her family with are present 24/7 as daughter works from home.   Patient Condition: I have reviewed medical records from [facility], spoken with RN, and patient and daughter. I met with patient at the bedside for inpatient rehabilitation assessment.  Patient will benefit from ongoing PT, OT and SLP, can actively participate in 3 hours of therapy a day 5 days of the week, and can make measurable gains during the admission.  Patient will also benefit from the coordinated team approach during an Inpatient Acute Rehabilitation admission.  The patient will receive intensive therapy as  well as Rehabilitation physician, nursing, social worker, and care management interventions.  Due to bladder management, bowel management, safety, skin/wound care, disease management, medication administration, pain management and patient education the patient requires 24 hour a day rehabilitation nursing.  The patient is currently Min/Mod A with mobility and basic ADLs.  Discharge setting and therapy post discharge at home with home health is anticipated.  Patient has agreed to participate in the Acute Inpatient Rehabilitation Program and will admit 08/02/2019.  Preadmission Screen Completed By:  Jhonnie Garner, 08/02/2019 10:51 AM ______________________________________________________________________   Discussed status with Dr. Naaman Plummer on 08/02/2019 at 10:51AM and received approval for admission today.  Admission Coordinator:  Jhonnie Garner, OT, time 10:51AM/Date 08/02/2019   Assessment/Plan: Diagnosis: debility d/t multiple medical issues, toxic encephalopathy 1. Does the need for close, 24 hr/day Medical supervision in concert with the patient's rehab needs make it unreasonable for this patient to be served in a less intensive setting? Yes 2. Co-Morbidities requiring supervision/potential complications: PE, ovarian ca, CKD III, hx PRES,  3. Due to bladder management, bowel management, safety, skin/wound care, disease management, medication administration, pain management and patient education, does the patient require 24 hr/day rehab nursing? Yes 4. Does the patient require coordinated care of a physician, rehab nurse, PT (1-2 hrs/day, 5 days/week), OT (1-2 hrs/day, 5 days/week) and SLP (1-2 hrs/day, 5 days/week) to address physical and functional deficits in the context of the above medical diagnosis(es)? Yes Addressing deficits in the following areas: balance, endurance, locomotion, strength, transferring, bowel/bladder control, bathing, dressing, feeding, grooming, toileting, cognition and psychosocial  support 5. Can the patient actively participate in an intensive therapy program of at least 3 hrs of therapy 5 days a week? Yes 6. The potential for patient to make measurable gains while on inpatient rehab is excellent 7. Anticipated functional outcomes upon discharge from inpatients are: modified independent and supervision PT, modified independent and supervision OT, modified independent and supervision SLP 8. Estimated rehab length of stay to reach the above functional goals is: 5-8 days 9. Anticipated D/C setting: Home 10. Anticipated post D/C treatments: [place] therapy 11. Overall Rehab/Functional Prognosis: excellent  MD Signature: Meredith Staggers, MD, [facility] 08/02/2019         Revision History Date/Time User Provider Type Action  08/02/2019 11:00 AM Meredith Staggers, MD Physician Sign  08/02/2019 10:52 AM Jhonnie Garner, OT Rehab Admission Coordinator Share  View Details Report

## 2019-08-02 NOTE — Progress Notes (Signed)
Inpatient Rehabilitation-Admissions Coordinator   [facility] has received insurance approval and medical clearance from attending service for admit to CIR today. Pt and her daughter very excited for transition to CIR. Letter reviewed and consent forms signed. AC will update RN, SW/CM regarding plans.   Please call if questions.   Jhonnie Garner, OTR/L  Rehab Admissions Coordinator  [phone] 08/02/2019 10:49 AM

## 2019-08-02 NOTE — Progress Notes (Signed)
VAST consulted to cap and flush IP access. Called unit and spoke with pt's nurse as she requested call in consult. Pt's nurse confirmed need to leave IP accessed, but flush and cap. VAS RN advised we will take care of line as soon as we are able.

## 2019-08-02 NOTE — H&P (Signed)
Physical Medicine and Rehabilitation Admission H&P    Chief Complaint  Patient presents with   Abnormal Lab  : HPI: Rhonda Hale is a 73 year old right-handed female with history of pulmonary emboli maintained on anticoagulation, bone mineral metabolism followed by endocrinology services [place] in [facility], metastatic ovarian cancer status post hysterectomy as well as chemotherapy and microcytic anemia, CKD stage III with baseline creatinine 1.1-1.3, as well as recent admission 07/03/2019 to 07/17/2019 for 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  She was discharged home with assistance of her family independent with ADLs using assistive device.  Patient was completing a course of Bactrim as well as low-dose steroids.  Presented 07/26/2019 with altered mental status, tremors as well as decrease in appetite.  Daughter reports urinary and fecal incontinence at baseline.  In the ED potassium 5.9, sodium 127, BUN 36, creatinine 1.80, COVID negative, urine culture 80,000 Staphylococcus epidermidis, WBC 14,600, hemoglobin 9.1, ammonia level 18.  Chest x-ray small left pleural effusion and left lung base atelectasis or infiltrate, cranial CT/MRI scan negative for acute changes.  No intracranial mass or hemorrhage.  No correlation seen on head CT or imaging suggesting posterior reversible encephalopathy syndrome.  Patient with noted bouts of mild clonus and jerking with EEG negative for seizure.  Follow-up neurology services suspect acute toxic metabolic encephalopathy.  Renal service follow-up for hyponatremia and hyperkalemia.  SSRI held due to hyponatremia.  She did respond well to normal saline at 75 an hour after switch from D5W and latest sodium 133.  Her mental status continues to improve and she continues on low-dose Seroquel.  Tolerating a regular diet.  Therapy evaluations completed and patient was admitted for a  comprehensive rehab program.  Review of Systems  Constitutional: Positive for malaise/fatigue.       Decreased appetite  HENT: Negative for hearing loss.   Eyes: Negative for blurred vision and double vision.  Respiratory: Negative for cough and shortness of breath.   Cardiovascular: Positive for leg swelling. Negative for chest pain and palpitations.  Gastrointestinal: Positive for constipation. Negative for heartburn, nausea and vomiting.  Genitourinary: Negative for dysuria, flank pain and hematuria.  Musculoskeletal: Positive for myalgias.  Skin: Negative for rash.  Neurological:       Hallucinations  All other systems reviewed and are negative.  Past Medical History:  Diagnosis Date   Cancer [facility])    Past Surgical History:  Procedure Laterality Date   ABDOMINAL HYSTERECTOMY N/A    History reviewed. No pertinent family history. Social History:  reports that she has never smoked. She has never used smokeless tobacco. She reports previous alcohol use. She reports that she does not use drugs. Allergies:  Allergies  Allergen Reactions   Atorvastatin Other (See Comments)    Severe heartburn, reflux   Medications Prior to Admission  Medication Sig Dispense Refill   amLODipine (NORVASC) 10 MG tablet Take 1 tablet (10 mg total) by mouth daily. 30 tablet 0   calcitRIOL (ROCALTROL) 0.5 MCG capsule Take 0.5 mcg by mouth 2 (two) times a day.     Calcium Carb-Cholecalciferol (CALCIUM-VITAMIN D) 600-400 MG-UNIT TABS Take 1 tablet by mouth 2 (two) times a day.     escitalopram (LEXAPRO) 20 MG tablet Take 20 mg by mouth daily.     labetalol (NORMODYNE) 100 MG tablet Take 0.5 tablets (50 mg total) by mouth 2 (two) times daily. 60 tablet 0   levothyroxine (SYNTHROID) 137 MCG  tablet Take 137 mcg by mouth daily before breakfast.     lidocaine-prilocaine (EMLA) cream Apply 1 application topically daily as needed (prior to port access).      megestrol (MEGACE) 20 MG tablet Take 40  mg by mouth 2 (two) times daily.     predniSONE (DELTASONE) 20 MG tablet Take 40 mg once daily for 8 days then 20 mg once daily for 11 days. (Patient taking differently: Take 20-40 mg by mouth See admin instructions. Ordered 07/17/19: Take two tablets (40 mg) once daily for 8 days. Then take one tablet  (20 mg) once daily for 11 days.) 27 tablet 0   rivaroxaban (XARELTO) 20 MG TABS tablet Take 20 mg by mouth at bedtime.      sulfamethoxazole-trimethoprim (BACTRIM DS) 800-160 MG tablet Take 2 tablets by mouth every 8 (eight) hours for 16 days. 96 tablet 0   Tiotropium Bromide Monohydrate (SPIRIVA RESPIMAT) 1.25 MCG/ACT AERS Inhale 1 puff into the lungs daily as needed (shortness of breath).     dexamethasone (DECADRON) 4 MG tablet Take 4 mg by mouth See admin instructions. Take one tablet (4 mg) by mouth twice daily on days 2 and 3 after chemotherapy     ondansetron (ZOFRAN) 8 MG tablet Take 8 mg by mouth See admin instructions. Take one tablet (8 mg) by mouth twice daily on days 2 and 3 after chemotherapy      Drug Regimen Review Drug regimen was reviewed and remains appropriate with no significant issues identified  Home: Home Living Family/patient expects to be discharged to:: Private residence Living Arrangements: Children Available Help at Discharge: Family, Available 24 hours/day Type of Home: House Home Access: Level entry Home Layout: Two level, 1/2 bath on main level Bathroom Shower/Tub: Chiropodist: Standard Home Equipment: Environmental consultant - 2 wheels, Shower seat Additional Comments: Pt living with daughter while undergoing chemo.  Lives With: Daughter   Functional History: Prior Function Level of Independence: Independent, Needs assistance Gait / Transfers Assistance Needed: Independent, assistive device prn Comments: Independent up till four weeks ago and has required assist for BADLs and transfers  Functional Status:  Mobility: Bed Mobility Overal bed  mobility: Needs Assistance Bed Mobility: Supine to Sit, Sit to Supine Supine to sit: Min guard Sit to supine: Min assist General bed mobility comments: pt was able to get to EOB with MIN guard for safety, using bed rails with HOB elevated. Cues to get feet on floor before pt attempt to stand Transfers Overall transfer level: Needs assistance Equipment used: Rolling walker (2 wheeled) Transfers: Sit to/from Stand Sit to Stand: Min assist, From elevated surface Stand pivot transfers: Min assist General transfer comment: Pt min A for transfer and sit>stand. most assist needed for safety awareness and hand placement during functional transfers Ambulation/Gait Ambulation/Gait assistance: Min assist, Mod assist Gait Distance (Feet): 10 Feet(3+3+4) Assistive device: Rolling walker (2 wheeled), 1 person hand held assist Gait Pattern/deviations: Step-to pattern General Gait Details: sidestepping on side of bed with dense cues for pt to keep walker close and to sidestep alternately with walker movement Gait velocity: Decreased Gait velocity interpretation: <1.8 ft/sec, indicate of risk for recurrent falls    ADL: ADL Overall ADL's : Needs assistance/impaired Eating/Feeding: Minimal assistance Eating/Feeding Details (indicate cue type and reason): daughter stated she was no longer using AE and eating with MIN A now Upper Body Dressing : Minimal assistance Upper Body Dressing Details (indicate cue type and reason): verbal cues for sequencing of donning hospital gown  Lower Body Dressing: Moderate assistance Lower Body Dressing Details (indicate cue type and reason): able to complete figure four but unable to don sock d/t decreaed sitting balance- able to pull up sock with MOD external support Toilet Transfer: Minimal assistance, Min guard, Stand-pivot, RW Toilet Transfer Details (indicate cue type and reason): standpivot from EOB to recliner- cues to reach back when sitting and to not pull up on  RW Functional mobility during ADLs: Min guard, Minimal assistance General ADL Comments: requries verbal cues for sequencing of tasks and safety  Cognition: Cognition Overall Cognitive Status: Impaired/Different from baseline Arousal/Alertness: Awake/alert Orientation Level: Oriented to person, Oriented to place, Oriented to situation, Oriented to time Attention: Sustained Sustained Attention: Impaired Sustained Attention Impairment: Verbal basic Memory: Impaired Memory Impairment: Retrieval deficit Awareness: Impaired Awareness Impairment: Intellectual impairment Problem Solving: Impaired Problem Solving Impairment: Verbal basic Safety/Judgment: Impaired Cognition Arousal/Alertness: Awake/alert Behavior During Therapy: Anxious, Impulsive Overall Cognitive Status: Impaired/Different from baseline Area of Impairment: Problem solving, Awareness, Safety/judgement, Following commands, Memory Memory: Decreased short-term memory(asked me several times where her robe was- cued pt that it was not here) Following Commands: Follows one step commands inconsistently, Follows one step commands with increased time Safety/Judgement: Decreased awareness of safety, Decreased awareness of deficits Awareness: Intellectual, Emergent Problem Solving: Slow processing, Requires verbal cues, Requires tactile cues General Comments: pt with poor safety awareness and decreaed insight into deficits. pt impulsive during transfer and required MOD verbal cues for safety.  Physical Exam: Blood pressure 129/61, pulse 70, temperature 98.3 F (36.8 C), temperature source Oral, resp. rate 18, height 5\' 2"  (1.575 m), weight 74 kg, SpO2 97 %. Physical Exam  Constitutional: No distress.  HENT:  Head: Normocephalic and atraumatic.  Eyes: Pupils are equal, round, and reactive to light. Conjunctivae and EOM are normal.  Neck: Normal range of motion. No tracheal deviation present. No thyromegaly present.   Cardiovascular: Normal rate, regular rhythm and normal heart sounds. Exam reveals no friction rub.  No murmur heard. Respiratory: Effort normal and breath sounds normal. No respiratory distress. She has no wheezes.  GI: Soft. Bowel sounds are normal. She exhibits no distension. There is no abdominal tenderness.  Musculoskeletal: Normal range of motion.        General: No edema.  Neurological: She is alert.  Fair insight and awareness. Some delays in processing and tends to be a little tangential. Otherwise fairly appropriate. UE 4/5. LE 4/5 prox to distal. No sensory findings  Skin: Skin is warm and dry. She is not diaphoretic.  Tattoo left wrist  Psychiatric: She has a normal mood and affect. Her behavior is normal.    Results for orders placed or performed during the hospital encounter of 07/26/19 (from the past 48 hour(s))  Basic metabolic panel     Status: Abnormal   Collection Time: 07/31/19  9:13 AM  Result Value Ref Range   Sodium 118 (LL) 135 - 145 mmol/L    Comment: CRITICAL RESULT CALLED TO, READ BACK BY AND VERIFIED WITH: YAT,S RN @0948  ON [id number] BY FLEMINGS    Potassium 4.1 3.5 - 5.1 mmol/L   Chloride 90 (L) 98 - 111 mmol/L   CO2 14 (L) 22 - 32 mmol/L   Glucose, Bld 274 (H) 70 - 99 mg/dL   BUN 50 (H) 8 - 23 mg/dL   Creatinine, Ser 2.46 (H) 0.44 - 1.00 mg/dL   Calcium 7.7 (L) 8.9 - 10.3 mg/dL   GFR calc non Af Amer 19 (L) >60 mL/min  GFR calc Af Amer 21 (L) >60 mL/min   Anion gap 14 5 - 15    Comment: Performed at [facility] [address], [place], [place] [address]  Osmolality     Status: Abnormal   Collection Time: 07/31/19 10:35 AM  Result Value Ref Range   Osmolality 272 (L) 275 - 295 mOsm/kg    Comment: REPEATED TO VERIFY Performed at [facility], [place] [address] [place], [place] [address]   Sodium, urine, random     Status: None   Collection Time: 07/31/19 11:08 AM  Result Value Ref Range   Sodium, Ur 29 mmol/L    Comment: Performed at  [facility] [address] [place], [place] [address]  Creatinine, urine, random     Status: None   Collection Time: 07/31/19 11:08 AM  Result Value Ref Range   Creatinine, Urine 109.68 mg/dL    Comment: Performed at [facility] [address] [place], [place] [address]  Osmolality, urine     Status: Abnormal   Collection Time: 07/31/19  1:20 PM  Result Value Ref Range   Osmolality, Ur 291 (L) 300 - 900 mOsm/kg    Comment: Performed at [facility] [address], [place], [place] [id number]  Basic metabolic panel     Status: Abnormal   Collection Time: 07/31/19  1:30 PM  Result Value Ref Range   Sodium 119 (LL) 135 - 145 mmol/L    Comment: CRITICAL RESULT CALLED TO, READ BACK BY AND VERIFIED WITH: C YEP RN AT 1358 ON [id number] BY K FORSYTH    Potassium 4.2 3.5 - 5.1 mmol/L   Chloride 90 (L) 98 - 111 mmol/L   CO2 14 (L) 22 - 32 mmol/L   Glucose, Bld 254 (H) 70 - 99 mg/dL   BUN 51 (H) 8 - 23 mg/dL   Creatinine, Ser 2.58 (H) 0.44 - 1.00 mg/dL   Calcium 7.9 (L) 8.9 - 10.3 mg/dL   GFR calc non Af Amer 18 (L) >60 mL/min   GFR calc Af Amer 20 (L) >60 mL/min   Anion gap 15 5 - 15    Comment: Performed at [facility], [place] [address], [place], [place] [id number]  Basic metabolic panel     Status: Abnormal   Collection Time: 07/31/19  5:55 PM  Result Value Ref Range   Sodium 119 (LL) 135 - 145 mmol/L    Comment: CRITICAL RESULT CALLED TO, READ BACK BY AND VERIFIED WITH: C YAK,RN 1852 07/31/2019 WBOND    Potassium 4.0 3.5 - 5.1 mmol/L   Chloride 92 (L) 98 - 111 mmol/L   CO2 16 (L) 22 - 32 mmol/L   Glucose, Bld 253 (H) 70 - 99 mg/dL   BUN 56 (H) 8 - 23 mg/dL   Creatinine, Ser 2.64 (H) 0.44 - 1.00 mg/dL   Calcium 8.0 (L) 8.9 - 10.3 mg/dL   GFR calc non Af Amer 17 (L) >60 mL/min   GFR calc Af Amer 20 (L) >60 mL/min   Anion gap 11 5 - 15    Comment: Performed at [facility], [place] [address], [place], [place] [id number]  Basic metabolic panel      Status: Abnormal   Collection Time: 08/01/19  3:15 AM  Result Value Ref Range   Sodium 124 (L) 135 - 145 mmol/L   Potassium 4.2 3.5 - 5.1 mmol/L   Chloride 97 (L) 98 -  111 mmol/L   CO2 17 (L) 22 - 32 mmol/L   Glucose, Bld 196 (H) 70 - 99 mg/dL   BUN 60 (H) 8 - 23 mg/dL   Creatinine, Ser 2.24 (H) 0.44 - 1.00 mg/dL   Calcium 8.0 (L) 8.9 - 10.3 mg/dL   GFR calc non Af Amer 21 (L) >60 mL/min   GFR calc Af Amer 24 (L) >60 mL/min   Anion gap 10 5 - 15    Comment: Performed at [facility] [address], [place], [place] [id number]  Basic metabolic panel     Status: Abnormal   Collection Time: 08/01/19 10:14 AM  Result Value Ref Range   Sodium 129 (L) 135 - 145 mmol/L   Potassium 3.9 3.5 - 5.1 mmol/L   Chloride 101 98 - 111 mmol/L   CO2 17 (L) 22 - 32 mmol/L   Glucose, Bld 204 (H) 70 - 99 mg/dL   BUN 53 (H) 8 - 23 mg/dL   Creatinine, Ser 1.85 (H) 0.44 - 1.00 mg/dL   Calcium 8.1 (L) 8.9 - 10.3 mg/dL   GFR calc non Af Amer 26 (L) >60 mL/min   GFR calc Af Amer 30 (L) >60 mL/min   Anion gap 11 5 - 15    Comment: Performed at [facility] [address], [place], [place] [address]  Renal function panel     Status: Abnormal   Collection Time: 08/02/19  4:00 AM  Result Value Ref Range   Sodium 133 (L) 135 - 145 mmol/L   Potassium 3.4 (L) 3.5 - 5.1 mmol/L   Chloride 104 98 - 111 mmol/L   CO2 19 (L) 22 - 32 mmol/L   Glucose, Bld 129 (H) 70 - 99 mg/dL   BUN 31 (H) 8 - 23 mg/dL   Creatinine, Ser 1.13 (H) 0.44 - 1.00 mg/dL   Calcium 8.1 (L) 8.9 - 10.3 mg/dL   Phosphorus 2.3 (L) 2.5 - 4.6 mg/dL   Albumin 2.6 (L) 3.5 - 5.0 g/dL   GFR calc non Af Amer 48 (L) >60 mL/min   GFR calc Af Amer 55 (L) >60 mL/min   Anion gap 10 5 - 15    Comment: Performed at [facility] [address] [place], [place] [address]  CBC     Status: Abnormal   Collection Time: 08/02/19  4:00 AM  Result Value Ref Range   WBC 13.6 (H) 4.0 - 10.5 K/uL   RBC 2.60 (L) 3.87 - 5.11 MIL/uL    Hemoglobin 8.1 (L) 12.0 - 15.0 g/dL   HCT 24.0 (L) 36.0 - 46.0 %   MCV 92.3 80.0 - 100.0 fL   MCH 31.2 26.0 - 34.0 pg   MCHC 33.8 30.0 - 36.0 g/dL   RDW 20.3 (H) 11.5 - 15.5 %   Platelets 225 150 - 400 K/uL   nRBC 0.6 (H) 0.0 - 0.2 %    Comment: Performed at [facility] [address] [place], [place] [address]  Cortisol-am, blood     Status: Abnormal   Collection Time: 08/02/19  4:00 AM  Result Value Ref Range   Cortisol - AM 2.0 (L) 6.7 - 22.6 ug/dL    Comment: Performed at [facility] [address], [place], [place] [address]   No results found.     Medical Problem List and Plan: 1.  Decreased functional mobility secondary to acute toxic metabolic encephalopathy  -admit to inpatient rehab  2.  Antithrombotics: -DVT/anticoagulation: Eliquis for history of pulmonary emboli  -antiplatelet therapy: N/A 3. Pain Management: Tylenol as needed 4. Mood: Seroquel 25 mg nightly  -antipsychotic agents: N/A 5. Neuropsych: This patient is not yet capable of making decisions on her own behalf.  -she has been experiencing some HS confusion which does seem to be showing signs of improvement. I certainly would expect further improvement once home in familiar environ   -daughter is staying with her.  -will monitor sleep patterns (sleep wake chart)   6. Skin/Wound Care: Routine skin checks 7. Fluids/Electrolytes/Nutrition: Routine in and outs with follow-up chemistries 8.  History of metastatic ovarian cancer with chemotherapy.  Follow-up oncology services 9.  Acute on chronic renal insufficiency.  Baseline creatinine 1.1-1.3  -encourage fluids  -check labs monday. 10.  Bone mineral metabolism.  Followed by endocrinology in [place] 11.  Hypertension.  Norvasc 10 mg daily, labetalol 50 mg twice daily.  Monitor with increased mobility, vs per routine 12.  Hypothyroidism.  Synthroid    Lavon Paganini Angiulli, PA-C 08/02/2019

## 2019-08-02 NOTE — Progress Notes (Signed)
Subjective:  Rhonda Hale was seen today at bedside today and was accompanied by her daughter. She states that she is feeling like, "Everything is happening at once." She endorses nightmares, and not being able to sleep well last night. Her daughter states that her mother did not sleep well at all last night. She spoke through her sleep and woke up and has been alternating between anxious and angry.   Objective:  Vital signs in last 24 hours: Vitals:   08/01/19 0828 08/01/19 1040 08/01/19 2017 08/02/19 0433  BP:  119/64 (!) 136/56 129/61  Pulse: 70 77 76 70  Resp: 18 18 18 18   Temp:  98 F (36.7 C) 98.3 F (36.8 C) 98.3 F (36.8 C)  TempSrc:  Oral Oral Oral  SpO2:  97% 97% 97%  Weight:   74 kg   Height:       Physical Exam Vitals signs and nursing note reviewed.  Constitutional:      General: She is not in acute distress.    Appearance: She is obese. She is not ill-appearing, toxic-appearing or diaphoretic.     Comments: Patient appears anxious during the interview. She is awake and alert.  HENT:     Head: Normocephalic and atraumatic.  Cardiovascular:     Rate and Rhythm: Normal rate and regular rhythm.     Heart sounds: No murmur. No friction rub. No gallop.   Pulmonary:     Effort: Pulmonary effort is normal. No respiratory distress.     Breath sounds: Normal breath sounds. No stridor. No wheezing or rhonchi.  Neurological:     Mental Status: She is alert.     Comments: Abnormal shaking/jerking not noted during the interview or physical.      Assessment/Plan:  Principal Problem:   Encephalopathy Active Problems:   AKI (acute kidney injury) ([place])   Dehydration   AMS (altered mental status)   Hyponatremia  Hypotonic Hyponatremia: Improving - Na: 133 - Nephrology following, we appreciate their help with Rhonda Hale's care.  - Continue to monitor serial BMPs for her Hyponatremia.  AKI: improving - Creatinine: 1.13 - Continue I/O Monitoring  - Continue to  avoid nephrotoxic drugs. - Continue to follow BMPs. - Nephrology following, we appreciate their help with Rhonda Hale's care.   New Onset Tremors:  - Ordered MRI brain w w/o contrast: Motion artifacts with signal abnormality within non dominant distal R vertebral artery, which may reflect slow or high grade stenosis/occlusion.  - Neurology consulted             - Discontinued Solumedrol IV, per Neurology recommendation.              - EEG suggestive of cortical dysfunction in the L more than the R temporal region which is nonspecific to etiology. No seizure or epileptiform activity noted.              - Serum paraneoplastic panel results: pending.              Altered Mental Status:  - Hold home Prednisone in the case AMS is 2/2 to steroid psychosis. - Blood cultures: NGTD 5 days. - Decreased Seroquel to 25 mg. - Delirium precautions.  Acute on Chronic Anemia:  - Hgb: 8.1  Metastatic Ovarian Cancer:  - Continue Megace 40 mg BID.  Hypothyroidism:  - free T4: 0.59 - Continue Synthroid 150 mcg  Hyperkalemia: Resolved  Dispo: Anticipated discharge pending medical course.   Rhonda Mercury, MD 08/02/2019, 7:02  AM Pager: [phone]

## 2019-08-02 NOTE — Plan of Care (Signed)
°  Problem: Coping: °Goal: Level of anxiety will decrease °Outcome: Progressing °  °

## 2019-08-02 NOTE — H&P (Signed)
Physical Medicine and Rehabilitation Admission H&P        Chief Complaint  Patient presents with   Abnormal Lab  : HPI: Rhonda Hale is a 73 year old right-handed female with history of pulmonary emboli maintained on anticoagulation, bone mineral metabolism followed by endocrinology services [facility] in [facility], metastatic ovarian cancer status post hysterectomy as well as chemotherapy and microcytic anemia, CKD stage III with baseline creatinine 1.1-1.3, as well as recent admission 07/03/2019 to 07/17/2019 for 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  She was discharged home with assistance of her family independent with ADLs using assistive device.  Patient was completing a course of Bactrim as well as low-dose steroids.  Presented 07/26/2019 with altered mental status, tremors as well as decrease in appetite.  Daughter reports urinary and fecal incontinence at baseline.  In the ED potassium 5.9, sodium 127, BUN 36, creatinine 1.80, COVID negative, urine culture 80,000 Staphylococcus epidermidis, WBC 14,600, hemoglobin 9.1, ammonia level 18.  Chest x-ray small left pleural effusion and left lung base atelectasis or infiltrate, cranial CT/MRI scan negative for acute changes.  No intracranial mass or hemorrhage.  No correlation seen on head CT or imaging suggesting posterior reversible encephalopathy syndrome.  Patient with noted bouts of mild clonus and jerking with EEG negative for seizure.  Follow-up neurology services suspect acute toxic metabolic encephalopathy.  Renal service follow-up for hyponatremia and hyperkalemia.  SSRI held due to hyponatremia.  She did respond well to normal saline at 75 an hour after switch from D5W and latest sodium 133.  Her mental status continues to improve and she continues on low-dose Seroquel.  Tolerating a regular diet.  Therapy evaluations completed and patient was admitted for a  comprehensive rehab program.   Review of Systems  Constitutional: Positive for malaise/fatigue.       Decreased appetite  HENT: Negative for hearing loss.   Eyes: Negative for blurred vision and double vision.  Respiratory: Negative for cough and shortness of breath.   Cardiovascular: Positive for leg swelling. Negative for chest pain and palpitations.  Gastrointestinal: Positive for constipation. Negative for heartburn, nausea and vomiting.  Genitourinary: Negative for dysuria, flank pain and hematuria.  Musculoskeletal: Positive for myalgias.  Skin: Negative for rash.  Neurological:       Hallucinations  All other systems reviewed and are negative.       Past Medical History:  Diagnosis Date   Cancer [facility])          Past Surgical History:  Procedure Laterality Date   ABDOMINAL HYSTERECTOMY N/A     History reviewed. No pertinent family history. Social History:  reports that she has never smoked. She has never used smokeless tobacco. She reports previous alcohol use. She reports that she does not use drugs. Allergies:       Allergies  Allergen Reactions   Atorvastatin Other (See Comments)      Severe heartburn, reflux         Medications Prior to Admission  Medication Sig Dispense Refill   amLODipine (NORVASC) 10 MG tablet Take 1 tablet (10 mg total) by mouth daily. 30 tablet 0   calcitRIOL (ROCALTROL) 0.5 MCG capsule Take 0.5 mcg by mouth 2 (two) times a day.       Calcium Carb-Cholecalciferol (CALCIUM-VITAMIN D) 600-400 MG-UNIT TABS Take 1 tablet by mouth 2 (two) times a day.       escitalopram (LEXAPRO) 20 MG tablet  Take 20 mg by mouth daily.       labetalol (NORMODYNE) 100 MG tablet Take 0.5 tablets (50 mg total) by mouth 2 (two) times daily. 60 tablet 0   levothyroxine (SYNTHROID) 137 MCG tablet Take 137 mcg by mouth daily before breakfast.       lidocaine-prilocaine (EMLA) cream Apply 1 application topically daily as needed (prior to port access).         megestrol (MEGACE) 20 MG tablet Take 40 mg by mouth 2 (two) times daily.       predniSONE (DELTASONE) 20 MG tablet Take 40 mg once daily for 8 days then 20 mg once daily for 11 days. (Patient taking differently: Take 20-40 mg by mouth See admin instructions. Ordered 07/17/19: Take two tablets (40 mg) once daily for 8 days. Then take one tablet  (20 mg) once daily for 11 days.) 27 tablet 0   rivaroxaban (XARELTO) 20 MG TABS tablet Take 20 mg by mouth at bedtime.        sulfamethoxazole-trimethoprim (BACTRIM DS) 800-160 MG tablet Take 2 tablets by mouth every 8 (eight) hours for 16 days. 96 tablet 0   Tiotropium Bromide Monohydrate (SPIRIVA RESPIMAT) 1.25 MCG/ACT AERS Inhale 1 puff into the lungs daily as needed (shortness of breath).       dexamethasone (DECADRON) 4 MG tablet Take 4 mg by mouth See admin instructions. Take one tablet (4 mg) by mouth twice daily on days 2 and 3 after chemotherapy       ondansetron (ZOFRAN) 8 MG tablet Take 8 mg by mouth See admin instructions. Take one tablet (8 mg) by mouth twice daily on days 2 and 3 after chemotherapy         Drug Regimen Review Drug regimen was reviewed and remains appropriate with no significant issues identified   Home: Home Living Family/patient expects to be discharged to:: Private residence Living Arrangements: Children Available Help at Discharge: Family, Available 24 hours/day Type of Home: House Home Access: Level entry Home Layout: Two level, 1/2 bath on main level Bathroom Shower/Tub: Chiropodist: Standard Home Equipment: Environmental consultant - 2 wheels, Shower seat Additional Comments: Pt living with daughter while undergoing chemo.  Lives With: Daughter   Functional History: Prior Function Level of Independence: Independent, Needs assistance Gait / Transfers Assistance Needed: Independent, assistive device prn Comments: Independent up till four weeks ago and has required assist for BADLs and transfers    Functional Status:  Mobility: Bed Mobility Overal bed mobility: Needs Assistance Bed Mobility: Supine to Sit, Sit to Supine Supine to sit: Min guard Sit to supine: Min assist General bed mobility comments: pt was able to get to EOB with MIN guard for safety, using bed rails with HOB elevated. Cues to get feet on floor before pt attempt to stand Transfers Overall transfer level: Needs assistance Equipment used: Rolling walker (2 wheeled) Transfers: Sit to/from Stand Sit to Stand: Min assist, From elevated surface Stand pivot transfers: Min assist General transfer comment: Pt min A for transfer and sit>stand. most assist needed for safety awareness and hand placement during functional transfers Ambulation/Gait Ambulation/Gait assistance: Min assist, Mod assist Gait Distance (Feet): 10 Feet(3+3+4) Assistive device: Rolling walker (2 wheeled), 1 person hand held assist Gait Pattern/deviations: Step-to pattern General Gait Details: sidestepping on side of bed with dense cues for pt to keep walker close and to sidestep alternately with walker movement Gait velocity: Decreased Gait velocity interpretation: <1.8 ft/sec, indicate of risk for recurrent falls  ADL: ADL Overall ADL's : Needs assistance/impaired Eating/Feeding: Minimal assistance Eating/Feeding Details (indicate cue type and reason): daughter stated she was no longer using AE and eating with MIN A now Upper Body Dressing : Minimal assistance Upper Body Dressing Details (indicate cue type and reason): verbal cues for sequencing of donning hospital gown Lower Body Dressing: Moderate assistance Lower Body Dressing Details (indicate cue type and reason): able to complete figure four but unable to don sock d/t decreaed sitting balance- able to pull up sock with MOD external support Toilet Transfer: Minimal assistance, Min guard, Stand-pivot, RW Toilet Transfer Details (indicate cue type and reason): standpivot from EOB to  recliner- cues to reach back when sitting and to not pull up on RW Functional mobility during ADLs: Min guard, Minimal assistance General ADL Comments: requries verbal cues for sequencing of tasks and safety   Cognition: Cognition Overall Cognitive Status: Impaired/Different from baseline Arousal/Alertness: Awake/alert Orientation Level: Oriented to person, Oriented to place, Oriented to situation, Oriented to time Attention: Sustained Sustained Attention: Impaired Sustained Attention Impairment: Verbal basic Memory: Impaired Memory Impairment: Retrieval deficit Awareness: Impaired Awareness Impairment: Intellectual impairment Problem Solving: Impaired Problem Solving Impairment: Verbal basic Safety/Judgment: Impaired Cognition Arousal/Alertness: Awake/alert Behavior During Therapy: Anxious, Impulsive Overall Cognitive Status: Impaired/Different from baseline Area of Impairment: Problem solving, Awareness, Safety/judgement, Following commands, Memory Memory: Decreased short-term memory(asked me several times where her robe was- cued pt that it was not here) Following Commands: Follows one step commands inconsistently, Follows one step commands with increased time Safety/Judgement: Decreased awareness of safety, Decreased awareness of deficits Awareness: Intellectual, Emergent Problem Solving: Slow processing, Requires verbal cues, Requires tactile cues General Comments: pt with poor safety awareness and decreaed insight into deficits. pt impulsive during transfer and required MOD verbal cues for safety.   Physical Exam: Blood pressure 129/61, pulse 70, temperature 98.3 F (36.8 C), temperature source Oral, resp. rate 18, height 5\' 2"  (1.575 m), weight 74 kg, SpO2 97 %. Physical Exam  Constitutional: No distress.  HENT:  Head: Normocephalic and atraumatic.  Eyes: Pupils are equal, round, and reactive to light. Conjunctivae and EOM are normal.  Neck: Normal range of motion. No  tracheal deviation present. No thyromegaly present.  Cardiovascular: Normal rate, regular rhythm and normal heart sounds. Exam reveals no friction rub.  No murmur heard. Respiratory: Effort normal and breath sounds normal. No respiratory distress. She has no wheezes.  GI: Soft. Bowel sounds are normal. She exhibits no distension. There is no abdominal tenderness.  Musculoskeletal: Normal range of motion.        General: No edema.  Neurological: She is alert.  Fair insight and awareness. Some delays in processing and tends to be a little tangential. Otherwise fairly appropriate. UE 4/5. LE 4/5 prox to distal. No sensory findings  Skin: Skin is warm and dry. She is not diaphoretic.  Tattoo left wrist  Psychiatric: She has a normal mood and affect. Her behavior is normal.      Lab Results Last 48 Hours        Results for orders placed or performed during the hospital encounter of 07/26/19 (from the past 48 hour(s))  Basic metabolic panel     Status: Abnormal    Collection Time: 07/31/19  9:13 AM  Result Value Ref Range    Sodium 118 (LL) 135 - 145 mmol/L      Comment: CRITICAL RESULT CALLED TO, READ BACK BY AND VERIFIED WITH: YAT,S RN @0948  ON [id number] BY Leane Platt  Potassium 4.1 3.5 - 5.1 mmol/L    Chloride 90 (L) 98 - 111 mmol/L    CO2 14 (L) 22 - 32 mmol/L    Glucose, Bld 274 (H) 70 - 99 mg/dL    BUN 50 (H) 8 - 23 mg/dL    Creatinine, Ser 2.46 (H) 0.44 - 1.00 mg/dL    Calcium 7.7 (L) 8.9 - 10.3 mg/dL    GFR calc non Af Amer 19 (L) >60 mL/min    GFR calc Af Amer 21 (L) >60 mL/min    Anion gap 14 5 - 15      Comment: Performed at [facility] [address] [place], [place] [address]  Osmolality     Status: Abnormal    Collection Time: 07/31/19 10:35 AM  Result Value Ref Range    Osmolality 272 (L) 275 - 295 mOsm/kg      Comment: REPEATED TO VERIFY Performed at [facility], [place] [address] [place], [place] [address]    Sodium, urine, random     Status: None     Collection Time: 07/31/19 11:08 AM  Result Value Ref Range    Sodium, Ur 29 mmol/L      Comment: Performed at [facility] [address], [place], [place] [address]  Creatinine, urine, random     Status: None    Collection Time: 07/31/19 11:08 AM  Result Value Ref Range    Creatinine, Urine 109.68 mg/dL      Comment: Performed at [facility] [address] [place], [place] [address]  Osmolality, urine     Status: Abnormal    Collection Time: 07/31/19  1:20 PM  Result Value Ref Range    Osmolality, Ur 291 (L) 300 - 900 mOsm/kg      Comment: Performed at [facility] [address], [place], [place] [id number]  Basic metabolic panel     Status: Abnormal    Collection Time: 07/31/19  1:30 PM  Result Value Ref Range    Sodium 119 (LL) 135 - 145 mmol/L      Comment: CRITICAL RESULT CALLED TO, READ BACK BY AND VERIFIED WITH: C YEP RN AT 1358 ON [id number] BY K FORSYTH      Potassium 4.2 3.5 - 5.1 mmol/L    Chloride 90 (L) 98 - 111 mmol/L    CO2 14 (L) 22 - 32 mmol/L    Glucose, Bld 254 (H) 70 - 99 mg/dL    BUN 51 (H) 8 - 23 mg/dL    Creatinine, Ser 2.58 (H) 0.44 - 1.00 mg/dL    Calcium 7.9 (L) 8.9 - 10.3 mg/dL    GFR calc non Af Amer 18 (L) >60 mL/min    GFR calc Af Amer 20 (L) >60 mL/min    Anion gap 15 5 - 15      Comment: Performed at [facility], [place] [address], [place],  [id number]  Basic metabolic panel     Status: Abnormal    Collection Time: 07/31/19  5:55 PM  Result Value Ref Range    Sodium 119 (LL) 135 - 145 mmol/L      Comment: CRITICAL RESULT CALLED TO, READ BACK BY AND VERIFIED WITH: C YAK,RN 1852 07/31/2019 WBOND      Potassium 4.0 3.5 - 5.1 mmol/L    Chloride 92 (L) 98 - 111 mmol/L    CO2 16 (L) 22 - 32 mmol/L    Glucose,  Bld 253 (H) 70 - 99 mg/dL    BUN 56 (H) 8 - 23 mg/dL    Creatinine, Ser 2.64 (H) 0.44 - 1.00 mg/dL    Calcium 8.0 (L) 8.9 - 10.3 mg/dL    GFR calc non Af Amer 17 (L) >60 mL/min    GFR calc Af Amer 20 (L)  >60 mL/min    Anion gap 11 5 - 15      Comment: Performed at [facility] [address], [place], [place] [id number]  Basic metabolic panel     Status: Abnormal    Collection Time: 08/01/19  3:15 AM  Result Value Ref Range    Sodium 124 (L) 135 - 145 mmol/L    Potassium 4.2 3.5 - 5.1 mmol/L    Chloride 97 (L) 98 - 111 mmol/L    CO2 17 (L) 22 - 32 mmol/L    Glucose, Bld 196 (H) 70 - 99 mg/dL    BUN 60 (H) 8 - 23 mg/dL    Creatinine, Ser 2.24 (H) 0.44 - 1.00 mg/dL    Calcium 8.0 (L) 8.9 - 10.3 mg/dL    GFR calc non Af Amer 21 (L) >60 mL/min    GFR calc Af Amer 24 (L) >60 mL/min    Anion gap 10 5 - 15      Comment: Performed at [facility] [address] [place], [place] [id number]  Basic metabolic panel     Status: Abnormal    Collection Time: 08/01/19 10:14 AM  Result Value Ref Range    Sodium 129 (L) 135 - 145 mmol/L    Potassium 3.9 3.5 - 5.1 mmol/L    Chloride 101 98 - 111 mmol/L    CO2 17 (L) 22 - 32 mmol/L    Glucose, Bld 204 (H) 70 - 99 mg/dL    BUN 53 (H) 8 - 23 mg/dL    Creatinine, Ser 1.85 (H) 0.44 - 1.00 mg/dL    Calcium 8.1 (L) 8.9 - 10.3 mg/dL    GFR calc non Af Amer 26 (L) >60 mL/min    GFR calc Af Amer 30 (L) >60 mL/min    Anion gap 11 5 - 15      Comment: Performed at [facility] [address] [place], [place] [address]  Renal function panel     Status: Abnormal    Collection Time: 08/02/19  4:00 AM  Result Value Ref Range    Sodium 133 (L) 135 - 145 mmol/L    Potassium 3.4 (L) 3.5 - 5.1 mmol/L    Chloride 104 98 - 111 mmol/L    CO2 19 (L) 22 - 32 mmol/L    Glucose, Bld 129 (H) 70 - 99 mg/dL    BUN 31 (H) 8 - 23 mg/dL    Creatinine, Ser 1.13 (H) 0.44 - 1.00 mg/dL    Calcium 8.1 (L) 8.9 - 10.3 mg/dL    Phosphorus 2.3 (L) 2.5 - 4.6 mg/dL    Albumin 2.6 (L) 3.5 - 5.0 g/dL    GFR calc non Af Amer 48 (L) >60 mL/min    GFR calc Af Amer 55 (L) >60 mL/min    Anion gap 10 5 - 15      Comment: Performed at [facility]  [address], [place], [place] [address]  CBC     Status: Abnormal    Collection Time: 08/02/19  4:00 AM  Result Value Ref Range  WBC 13.6 (H) 4.0 - 10.5 K/uL    RBC 2.60 (L) 3.87 - 5.11 MIL/uL    Hemoglobin 8.1 (L) 12.0 - 15.0 g/dL    HCT 24.0 (L) 36.0 - 46.0 %    MCV 92.3 80.0 - 100.0 fL    MCH 31.2 26.0 - 34.0 pg    MCHC 33.8 30.0 - 36.0 g/dL    RDW 20.3 (H) 11.5 - 15.5 %    Platelets 225 150 - 400 K/uL    nRBC 0.6 (H) 0.0 - 0.2 %      Comment: Performed at [facility] [address], [place], [place] [address]  Cortisol-am, blood     Status: Abnormal    Collection Time: 08/02/19  4:00 AM  Result Value Ref Range    Cortisol - AM 2.0 (L) 6.7 - 22.6 ug/dL      Comment: Performed at [facility] [address] [place], [place] [address]     Imaging Results (Last 48 hours)  No results found.           Medical Problem List and Plan: 1.  Decreased functional mobility secondary to acute toxic metabolic encephalopathy             -admit to inpatient rehab 2.  Antithrombotics: -DVT/anticoagulation: Eliquis for history of pulmonary emboli             -antiplatelet therapy: N/A 3. Pain Management: Tylenol as needed 4. Mood: Seroquel 25 mg nightly             -antipsychotic agents: N/A 5. Neuropsych: This patient is not yet capable of making decisions on her own behalf.             -she has been experiencing some HS confusion which does seem to be showing signs of improvement. I certainly would expect further improvement once home in familiar environ              -daughter is staying with her.             -will monitor sleep patterns (sleep wake chart)              6. Skin/Wound Care: Routine skin checks 7. Fluids/Electrolytes/Nutrition: Routine in and outs with follow-up chemistries 8.  History of metastatic ovarian cancer with chemotherapy.  Follow-up oncology services 9.  Acute on chronic renal insufficiency.  Baseline creatinine 1.1-1.3             -encourage fluids              -check labs monday. 10.  Bone mineral metabolism.  Followed by endocrinology in [place] 11.  Hypertension.  Norvasc 10 mg daily, labetalol 50 mg twice daily.  Monitor with increased mobility, vs per routine 12.  Hypothyroidism.  Synthroid    Post Admission Physician Evaluation: 1. Functional deficits secondary  to toxic encephalopathy. 2. Patient is admitted to receive collaborative, interdisciplinary care between the physiatrist, rehab nursing staff, and therapy team. 3. Patient's level of medical complexity and substantial therapy needs in context of that medical necessity cannot be provided at a lesser intensity of care such as a SNF. 4. Patient has experienced substantial functional loss from his/her baseline which was documented above under the "Functional History" and "Functional Status" headings.  Judging by the patient's diagnosis, physical exam, and functional history, the patient has potential for functional progress which will result in measurable gains while on inpatient rehab.  These  gains will be of substantial and practical use upon discharge  in facilitating mobility and self-care at the household level. 5. Physiatrist will provide 24 hour management of medical needs as well as oversight of the therapy plan/treatment and provide guidance as appropriate regarding the interaction of the two. 6. The Preadmission Screening has been reviewed and patient status is unchanged unless otherwise stated above. 7. 24 hour rehab nursing will assist with bladder management, bowel management, safety, skin/wound care, disease management, medication administration, pain management and patient education  and help integrate therapy concepts, techniques,education, etc. 8. PT will assess and treat for/with: Lower extremity strength, range of motion, stamina, balance, functional mobility, safety, adaptive techniques and equipment, family ed.   Goals are: mod I to  supervision. 9. OT will assess and treat for/with: ADL's, functional mobility, safety, upper extremity strength, adaptive techniques and equipment, NMR, family ed.   Goals are: mod I to supervision. Therapy may proceed with showering this patient. 10. SLP will assess and treat for/with: cognition, communication, family ed.  Goals are: mod I to supervision. 11. Case Management and Social Worker will assess and treat for psychological issues and discharge planning. 12. Team conference will be held weekly to assess progress toward goals and to determine barriers to discharge. 13. Patient will receive at least 3 hours of therapy per day at least 5 days per week. 14. ELOS: 5-8 days       15. Prognosis:  excellent   I have personally performed a face to face diagnostic evaluation of this patient and formulated the key components of the plan.  Additionally, I have personally reviewed laboratory data, imaging studies, as well as relevant notes and concur with the physician assistant's documentation above.  Meredith Staggers, MD, FAAPMR    Lavon Paganini [place], PA-C 08/02/2019

## 2019-08-02 NOTE — Progress Notes (Signed)
Physical Therapy Treatment Patient Details Name: Rhonda Hale MRN: [id number] DOB: 01/24/1946 Today's Date: 08/02/2019    History of Present Illness 73 y.o. yo female w/ PMH significant for metastatic ovarian cancer and macrocytic anemia found to have PRES syndrome on last hospital admission presenting to ED from home for worsening confusion and abnormal lab result.    PT Comments    Pt was seen for mobility and note her increased standing balance control, increased sitting control enough to use BSC and more tolerance with regard to mobility with less anxiety.  Her progression bodes well for moving into CIR with better expectations.  Messaged her CIR admission coordinator and nursing to state with daughter's agreement that she will try to use the [facility] all the time now.  Follow up with her acutely until dc to CIR.   Follow Up Recommendations  CIR     Equipment Recommendations  None recommended by PT    Recommendations for Other Services Rehab consult     Precautions / Restrictions Precautions Precautions: Fall Precaution Comments: has been having trunk extension "tone" with standing previously Restrictions Weight Bearing Restrictions: No    Mobility  Bed Mobility Overal bed mobility: Needs Assistance Bed Mobility: Supine to Sit     Supine to sit: Min assist     General bed mobility comments: pt asking PT to support her with UE's to sit up  Transfers Overall transfer level: Needs assistance Equipment used: Rolling walker (2 wheeled) Transfers: Sit to/from Stand Sit to Stand: Min assist;From elevated surface         General transfer comment: min to power up and min to get control of balance before walking  Ambulation/Gait Ambulation/Gait assistance: Min assist(close guard with chair) Gait Distance (Feet): 18 Feet Assistive device: Rolling walker (2 wheeled);1 person hand held assist Gait Pattern/deviations: Step-through pattern;Decreased stride length;Narrow base of  support;Trunk flexed;Ataxic Gait velocity: Decreased Gait velocity interpretation: <1.8 ft/sec, indicate of risk for recurrent falls General Gait Details: mildly ataxic with wide turns on walker and is inside walker better today   Stairs             Wheelchair Mobility    Modified Rankin (Stroke Patients Only)       Balance Overall balance assessment: Needs assistance Sitting-balance support: Feet supported Sitting balance-Leahy Scale: Fair   Postural control: Posterior lean Standing balance support: Bilateral upper extremity supported;During functional activity Standing balance-Leahy Scale: Poor Standing balance comment: requires UE use but more aware of how to distribute pressure on walker to control balance                            Cognition Arousal/Alertness: Awake/alert Behavior During Therapy: Impulsive Overall Cognitive Status: Impaired/Different from baseline Area of Impairment: Memory;Following commands;Safety/judgement;Awareness;Problem solving;Attention;Orientation                 Orientation Level: Time Current Attention Level: Selective Memory: Decreased short-term memory;Decreased recall of precautions Following Commands: Follows one step commands inconsistently;Follows one step commands with increased time Safety/Judgement: Decreased awareness of safety;Decreased awareness of deficits Awareness: Intellectual;Emergent Problem Solving: Slow processing;Requires verbal cues;Requires tactile cues General Comments: pt was able to use [facility] with safer presentation      Exercises General Exercises - Lower Extremity Ankle Circles/Pumps: AROM;AAROM;Both;5 reps Quad Sets: AROM;10 reps Gluteal Sets: AROM;10 reps Hip ABduction/ADduction: AAROM;Strengthening;10 reps Straight Leg Raises: AROM;10 reps    General Comments General comments (skin integrity, edema, etc.): Pt is up to  BSC suddenly with quick turn on walker, voided a great deal with  obvious control of urine.  Pt then walked around the bed with a chair close but was able to follow instructions from PT to sit with a more controlled turn, cues to keep walker close with the turn      Pertinent Vitals/Pain Pain Assessment: No/denies pain    Home Living                      Prior Function            PT Goals (current goals can now be found in the care plan section) Acute Rehab PT Goals Patient Stated Goal: to get up to move Progress towards PT goals: Progressing toward goals    Frequency    Min 3X/week      PT Plan Current plan remains appropriate    Co-evaluation              AM-PAC PT "6 Clicks" Mobility   Outcome Measure  Help needed turning from your back to your side while in a flat bed without using bedrails?: None Help needed moving from lying on your back to sitting on the side of a flat bed without using bedrails?: None Help needed moving to and from a bed to a chair (including a wheelchair)?: A Little Help needed standing up from a chair using your arms (e.g., wheelchair or bedside chair)?: A Little Help needed to walk in hospital room?: A Little Help needed climbing 3-5 steps with a railing? : Total 6 Click Score: 18    End of Session Equipment Utilized During Treatment: Gait belt Activity Tolerance: Patient limited by fatigue Patient left: in chair;with call bell/phone within reach;with nursing/sitter in room Nurse Communication: Mobility status PT Visit Diagnosis: Unsteadiness on feet (R26.81);Other abnormalities of gait and mobility (R26.89);Muscle weakness (generalized) (M62.81);Other symptoms and signs involving the nervous system (R29.898)     Time: [id number] PT Time Calculation (min) (ACUTE ONLY): 35 min  Charges:  $Gait Training: 8-22 mins $Therapeutic Exercise: 8-22 mins                   Ramond Dial 08/02/2019, 11:58 AM   Mee Hives, PT MS Acute Rehab Dept. Number: [place] [facility] [place]

## 2019-08-02 NOTE — Progress Notes (Signed)
[facility] Progress Note  Name: Rhonda Hale MRN: [id number] DOB: 07-09-1946  Chief Complaint:  Admitted with abnormal outpatient labs - hyperkalemia   Subjective:  Foley removed due to leaking and not inserted again due to patient preference.  Had 1.3 liters UOP over 8/20.  Has urinated after the removal of the foley.  Didn't really sleep overnight per her daughter and has been more agitated this morning but it's getting better       Review of systems:   Denies shortness of breath or chest pain  Denies n/v Appetite is better Had a BM ---------------------- Background on consult:  Rhonda Hale is a 73 y.o. female history including ovarian cancer previously treated with paclitaxel and Avastin who came into the ER after outpatient labs demonstrated hyperkalemia and worsening renal function.  Her potassium was 5.9 and creatinine was improved to 1.39 on presentation.  Hyperkalemia has resolved.  She has had altered mental status and neurology has been consulted for the same.  She had been doing better however this morning was more confused.  Her daughter is at bedside and states that she has been seeing things in the room but has been oriented.  She was seen recently during prior admission for AKI which was felt ischemic insults from hypotension at that time.  Note that she had been on D5W which was discontinued this morning upon receipt of the abnormal labs per the team.  She has also been on an SSRI and reports that she has been on this for several years.  Appetite has not been great.  She ate less last night.  She has not had any nausea or vomiting.  Her daughter has noticed decreased urine output as they have been changing her less frequently.  She is incontinent of urine at baseline.  She does not use NSAIDs for at least the past 6 months and only used intermittently prior to that.  Noted to have developed AKI with Cr 2.46 today.    Intake/Output Summary (Last 24 hours) at  08/02/2019 0821 Last data filed at 08/02/2019 0433 Gross per 24 hour  Intake 240 ml  Output 1325 ml  Net -1085 ml    Vitals:  Vitals:   08/01/19 1040 08/01/19 2017 08/02/19 0433 08/02/19 0724  BP: 119/64 (!) 136/56 129/61   Pulse: 77 76 70   Resp: 18 18 18    Temp: 98 F (36.7 C) 98.3 F (36.8 C) 98.3 F (36.8 C)   TempSrc: Oral Oral Oral   SpO2: 97% 97% 97% 98%  Weight:  74 kg    Height:         Physical Exam:  General adult female in bed in no acute distress  HEENT normocephalic atraumatic extraocular movements intact sclera anicteric Neck supple trachea midline Lungs clear to auscultation bilaterally normal work of breathing at rest  Heart regular rate and rhythm no rubs or gallops appreciated Abdomen soft nontender nondistended Extremities no edema  Psych normal mood and affect Neuro - more alert; she is oriented x 3; conversant  Medications reviewed   Labs:  BMP Latest Ref Rng & Units 08/02/2019 08/01/2019 08/01/2019  Glucose 70 - 99 mg/dL 129(H) 204(H) 196(H)  BUN 8 - 23 mg/dL 31(H) 53(H) 60(H)  Creatinine 0.44 - 1.00 mg/dL 1.13(H) 1.85(H) 2.24(H)  BUN/Creat Ratio 12 - 28 - - -  Sodium 135 - 145 mmol/L 133(L) 129(L) 124(L)  Potassium 3.5 - 5.1 mmol/L 3.4(L) 3.9 4.2  Chloride 98 - 111 mmol/L 104  101 97(L)  CO2 22 - 32 mmol/L 19(L) 17(L) 17(L)  Calcium 8.9 - 10.3 mg/dL 8.1(L) 8.1(L) 8.0(L)     Assessment/Plan:   # Hyponatremia - Secondary in part to hypotonic fluids given the acute worsening after the fluids.  Cannot r/o in part paraneoplastic however also on SSRI which is now off - Resolving  - Remain off of SSRI   # AKI  - Has had ischemic insults/ATN with mild hypotension, pre-renal insults, as well as retention - Resolving  - Repeat post-void bladder scan and in/out cath x 1 if retention for now  - Hx of recent AKI which was felt in part 2/2 ATN with hypotension   # AMS - improving   # Hyperglycemia - improved; per primary team   # CKD stage  III - baseline Cr is 1.1 - 1.3  # Ovarian cancer - note s/p paclitaxel and avastin   # Hyperkalemia resolved   # Metabolic acidosis - on sodium bicarbonate; improved.  Decrease sodium bicarb to 650 mg BID for now and reassess  # Bone mineral metabolism  - continue home calcitriol - managed by endocrinology in [place] (Dr. Anderson Malta)   # Anemia normocytic  - Note downward trend   - No acute indication for PRBC's  - Per primary team   Claudia Desanctis, MD 08/02/2019 8:21 AM

## 2019-08-02 NOTE — Discharge Summary (Signed)
Name: Gazella Sterman MRN: [id number] DOB: March 12, 1946 73 y.o. PCP: No primary care provider on file.  Date of Admission: 07/26/2019  1:31 PM Date of Discharge: 08/02/2019 Attending Physician: Lucious Groves, DO  Discharge Diagnosis: 1. Acute Kidney Injury  2. Encephalopathy  3. Hyponatremia  4. Acute on Chronic Anemia  Discharge Medications: Allergies as of 08/02/2019      Reactions   Atorvastatin Other (See Comments)   Severe heartburn, reflux      Medication List    STOP taking these medications   escitalopram 20 MG tablet Commonly known as: LEXAPRO   predniSONE 20 MG tablet Commonly known as: DELTASONE   sulfamethoxazole-trimethoprim 800-160 MG tablet Commonly known as: BACTRIM DS     TAKE these medications   amLODipine 10 MG tablet Commonly known as: NORVASC Take 1 tablet (10 mg total) by mouth daily.   calcitRIOL 0.5 MCG capsule Commonly known as: ROCALTROL Take 0.5 mcg by mouth 2 (two) times a day.   Calcium-Vitamin D 600-400 MG-UNIT Tabs Take 1 tablet by mouth 2 (two) times a day.   dexamethasone 4 MG tablet Commonly known as: DECADRON Take 4 mg by mouth See admin instructions. Take one tablet (4 mg) by mouth twice daily on days 2 and 3 after chemotherapy   labetalol 100 MG tablet Commonly known as: NORMODYNE Take 0.5 tablets (50 mg total) by mouth 2 (two) times daily.   levothyroxine 137 MCG tablet Commonly known as: SYNTHROID Take 137 mcg by mouth daily before breakfast.   lidocaine-prilocaine cream Commonly known as: EMLA Apply 1 application topically daily as needed (prior to port access).   megestrol 20 MG tablet Commonly known as: MEGACE Take 40 mg by mouth 2 (two) times daily.   ondansetron 8 MG tablet Commonly known as: ZOFRAN Take 8 mg by mouth See admin instructions. Take one tablet (8 mg) by mouth twice daily on days 2 and 3 after chemotherapy   rivaroxaban 20 MG Tabs tablet Commonly known as: XARELTO Take 20 mg by mouth at  bedtime.   Spiriva Respimat 1.25 MCG/ACT Aers Generic drug: Tiotropium Bromide Monohydrate Inhale 1 puff into the lungs daily as needed (shortness of breath).     Disposition and follow-up:   Ms.Adah Eldredge was discharged from [facility] in Stable condition.  At the hospital follow up visit please address:  1.  Encephalopathy. Follow-up the patient's paraneoplastic panel. Acute on Chronic Anemia. Recheck a CBC at her follow-up appointment.   2.  Labs / imaging needed at time of follow-up: None  3.  Pending labs/ test needing follow-up: Paraneoplastic panel.  Follow-up Appointments: Patient will be discharged to inpatient rehab and will follow-up with ID and her PCP once discharged from rehab.   Hospital Course by problem list:  1. Acute Kidney Injury. Mitzy Costella is a 73 year old female with metastatic ovarian cancer and prior hospitalization for PRES who presented to the emergency department after routine labs found her to have a AKI with hyperkalemia. It was determined that since being discharged from the hospital she had not been eating or drinking. In addition to this she was on Bactrim for possible PJP pneumonia. She was given IV fluids in her renal function subsequently returned to baseline. Her creatinine on discharge was 1.13. Her Bactrim was discontinued.  2. Encephalopathy. The patient was recently admitted from 7/22 to 8/5 for altered mental status secondary to PRES and possible PJP pneumonia. Since discharge on 8/5 the patient has had waxing and waning  mental status including auditory and visual hallucinations and decreased PO intake. Her daughter had noted increased tremor and jerking movements. Initially this was thought to be secondary to steroid induced psychosis; therefore, her prednisone was discontinued. She continued to have altered mental status with myoclonic activity. Neurology was consulted and CT of the head and MRI of the brain were negative for  metastatic lesions or any other pathology that would explain altered mental status and myoclonic jerks. Neurology was concerned for paraneoplastic syndrome and therefore she was given high dose methylprednisolone and paraneoplastic was sent out. This panel is still pending. She was also started on Seroquel. Over the next 48 hours her mentation significantly improved and the steroids were discontinued. Neurology recommended continuing to hold off on steroids and discontinuing the patient's Seroquel. We are still unsure what caused her encephalopathy.  3. Hyponatremia. After being hospitalized for approximately 48 hours patient's BMP return with new hyponatremia of 118. Labs were indicated of hypovolemia versus SIADH. Nephrology was consulted and started the patient on normal saline. They also discontinued her SSRI. Over the next 48 hours her hyponatremia resolved. In further evaluation morning cortisol was obtained and found to be at 2; however, this could be falsely low given that it was drawn at 4 AM. We will plan for a Cosyntropin stimulation test tomorrow morning.  4. Acute on Chronic Anemia. On admission the patient was found to be anemic at 6.5. She was transfused two units and overcorrected to a hemoglobin of 10. Over the preceding hospitalization her hemoglobin did drop back to approximately 8.1. She is on Xarelto however this was not discontinued because there were no signs of overt bleeding. Please continue to monitor her CBC.  Discharge Vitals:   BP (!) 119/53 (BP Location: Right Arm)   Pulse 69   Temp 98.9 F (37.2 C) (Oral)   Resp 18   Ht 5\' 2"  (1.575 m)   Wt 74 kg   SpO2 98%   BMI 29.84 kg/m   Pertinent Labs, Studies, and Procedures:   CT Head w/out contrast 07/27/2019 1. No acute findings. No intracranial mass, hemorrhage or edema. No correlate seen on today's head CT for the findings suggesting posterior reversible encephalopathy syndrome (PRES) on brain MRI of 07/06/2019. 2.  Chronic small vessel ischemic changes within the white matter.  MR Brain w/out contrast 07/30/2019 Examination significantly limited by susceptibility artifact, and to a lesser degree by patient motion as described.  Signal abnormality within the non-dominant distal right vertebral artery, which may reflect slow flow or high-grade stenosis/occlusion.  Otherwise, no evidence of acute intracranial abnormality.  Mild generalized parenchymal atrophy with moderate chronic small vessel ischemic disease. Redemonstrated small chronic right cerebellar lacunar infarct.  Discharge Instructions: Discharge Instructions    Diet - low sodium heart healthy   Complete by: As directed    Increase activity slowly   Complete by: As directed     Signed: [name], MD 08/02/2019, 10:46 AM

## 2019-08-03 ENCOUNTER — Inpatient Hospital Stay (HOSPITAL_COMMUNITY): Payer: Medicare Other

## 2019-08-03 ENCOUNTER — Encounter: Payer: Self-pay | Admitting: Internal Medicine

## 2019-08-03 ENCOUNTER — Inpatient Hospital Stay (HOSPITAL_COMMUNITY): Payer: Medicare Other | Admitting: Speech Pathology

## 2019-08-03 DIAGNOSIS — Z7901 Long term (current) use of anticoagulants: Secondary | ICD-10-CM

## 2019-08-03 DIAGNOSIS — C569 Malignant neoplasm of unspecified ovary: Secondary | ICD-10-CM

## 2019-08-03 DIAGNOSIS — E890 Postprocedural hypothyroidism: Secondary | ICD-10-CM

## 2019-08-03 DIAGNOSIS — G92 Toxic encephalopathy: Principal | ICD-10-CM

## 2019-08-03 LAB — RENAL FUNCTION PANEL
Albumin: 2.6 g/dL — ABNORMAL LOW (ref 3.5–5.0)
Anion gap: 11 (ref 5–15)
BUN: 11 mg/dL (ref 8–23)
CO2: 22 mmol/L (ref 22–32)
Calcium: 8.4 mg/dL — ABNORMAL LOW (ref 8.9–10.3)
Chloride: 104 mmol/L (ref 98–111)
Creatinine, Ser: 0.86 mg/dL (ref 0.44–1.00)
GFR calc Af Amer: >60 mL/min
GFR calc non Af Amer: >60 mL/min
Glucose, Bld: 161 mg/dL — ABNORMAL HIGH (ref 70–99)
Phosphorus: 2.8 mg/dL (ref 2.5–4.6)
Potassium: 3.9 mmol/L (ref 3.5–5.1)
Sodium: 137 mmol/L (ref 135–145)

## 2019-08-03 LAB — ACTH STIMULATION, 3 TIME POINTS
Cortisol, 30 Min: 9.8 ug/dL
Cortisol, 60 Min: 12.3 ug/dL
Cortisol, Base: 4.1 ug/dL

## 2019-08-03 NOTE — Evaluation (Signed)
Speech Language Pathology Assessment and Plan  Patient Details  Name: Rhonda Hale MRN: [id number] Date of Birth: November 26, 1946  SLP Diagnosis: Aphasia;Cognitive Impairments  Rehab Potential: Good ELOS: 7-10 days    Today's Date: 08/03/2019 SLP Individual Time: 4825-0037 SLP Individual Time Calculation (min): 55 min   Problem List:  Patient Active Problem List   Diagnosis Date Noted  . Toxic metabolic encephalopathy 04/88/8916  . Hyponatremia 07/31/2019  . AMS (altered mental status) 07/27/2019  . Dehydration 07/26/2019  . PRES (posterior reversible encephalopathy syndrome) 07/07/2019  . AKI (acute kidney injury) ([place]) 07/03/2019  . Hypercalcemia 07/03/2019  . Hypomagnesemia, chronic 07/03/2019  . Anticoagulated 04/15/2019  . Ovary cancer ([place]) 07/03/2017  . Postoperative hypothyroidism 01/01/2013   Past Medical History:  Past Medical History:  Diagnosis Date  . Cancer ([place])   . COPD (chronic obstructive pulmonary disease) ([place])   . Hypertension    Past Surgical History:  Past Surgical History:  Procedure Laterality Date  . ABDOMINAL HYSTERECTOMY N/A     Assessment / Plan / Recommendation Clinical Impression Patient is a 73 year old right-handed female with history of pulmonary emboli maintained on anticoagulation, bone mineral metabolism followed by endocrinology services [place], metastatic ovarian cancer status post hysterectomy as well as chemotherapy and microcytic anemia, CKD stage III with baseline creatinine 1.1-1.3, as well as recent admission 07/03/2019 to 07/17/2019 for 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 She was discharged home with assistance of her family independent with ADLs using assistive device. Patient was completing a course of Bactrim as well as low-dose steroids. Presented 07/26/2019 with altered mental status, tremors as well as decrease in appetite.  Daughter reports urinary and fecal incontinence at baseline. In the ED potassium 5.9, sodium 127, BUN 36, creatinine 1.80, COVID negative, urine culture 80,000 Staphylococcus epidermidis, WBC 14,600, hemoglobin 9.1, ammonia level 18. Chest x-ray small left pleural effusion and left lung base atelectasis or infiltrate, cranial CT/MRI scan negative for acute changes. No intracranial mass or hemorrhage. No correlation seen on head CT or imaging suggesting posterior reversible encephalopathy syndrome. Patient with noted bouts of mild clonus and jerking with EEG negative for seizure. Follow-up neurology services suspect acute toxic metabolic encephalopathy. Renal service follow-up for hyponatremia and hyperkalemia. SSRI held due to hyponatremia. Shedid respond well to normal saline at 75 an hour after switch from D5Wand latest sodium 133. Her mental status continues to improve and she continues on low-dose Seroquel. Tolerating a regular diet. Therapy evaluations completed and patient was admitted for a comprehensive rehab program 08/02/19.  Patient's overall cognitive functioning appears to fluctuate but overall patient demonstrates moderate-severe cognitive impairments impacting orientation, problem solving, initiation, recall and awareness which impacts her safety with functional and familiar tasks. Patient also demonstrates language of confusion with intermittent difficulty following commands. Verbal expression is characterized by deficits in word-finding with both phonemic and semantic paraphasias noted throughout conversation with minimal awareness or attempts to self-correct. Patient would benefit from skilled SLP intervention to maximize her cognitive-linguisitic function and overall functional independence prior to discharge home.    Skilled Therapeutic Interventions          Administered a cognitive-linguistic evaluation, please see above for details. Educated patient and her daughter in  regards ot current cognitive-linguistic deficits and goals of skilled SLP intervention, both verbalize understanding and agreement. Upon arrival, patient requesting to void. SLP facilitated session by providing Max verbal and tactile cues for initiating and overall problem solving with  task. Patient was able to successfully void and was transferred back to bed.    SLP Assessment  Patient will need skilled [place] Pathology Services during CIR admission    Recommendations  Oral Care Recommendations: Oral care BID Recommendations for Other Services: Neuropsych consult Patient destination: Home Follow up Recommendations: Home Health SLP;24 hour supervision/assistance;Outpatient SLP Equipment Recommended: None recommended by SLP    SLP Frequency 3 to 5 out of 7 days   SLP Duration  SLP Intensity  SLP Treatment/Interventions 7-10 days  Minumum of 1-2 x/day, 30 to 90 minutes  Cognitive remediation/compensation;Internal/external aids;Speech/Language facilitation;Therapeutic Activities;Environmental controls;Cueing hierarchy;Functional tasks;Patient/family education    Pain No/Denies Pain   Prior Functioning Type of Home: House  Lives With: Daughter Available Help at Discharge: Family;Available 24 hours/day  Short Term Goals: Week 1: SLP Short Term Goal 1 (Week 1): STGs=LTGs due to short length of stay  Refer to Care Plan for Long Term Goals  Recommendations for other services: Neuropsych  Discharge Criteria: Patient will be discharged from SLP if patient refuses treatment 3 consecutive times without medical reason, if treatment goals not met, if there is a change in medical status, if patient makes no progress towards goals or if patient is discharged from hospital.  The above assessment, treatment plan, treatment alternatives and goals were discussed and mutually agreed upon: by patient and by family  Auston Halfmann 08/03/2019, 2:20 PM

## 2019-08-03 NOTE — Progress Notes (Signed)
Pt voided with therapy earlier today.  Attempted to take pt to bathroom but pt refused and stated that didn't have to go. Pt was scanned at that time for a volume of 250. Will report to next shift that PVR is needed.

## 2019-08-03 NOTE — Progress Notes (Signed)
Patient bladder scanned for 500 cc this evening. Patient voided at 2115 with bowel movement. PVR 167 cc. Also, daughter was given information regarding visitation of which she signed.  She was told by Rehab admission coordinator Claiborne Billings that it is ok for her stay at night with her mom. Charge rn was informed.

## 2019-08-03 NOTE — Progress Notes (Signed)
[facility] Progress Note  Name: Rhonda Hale MRN: [id number] DOB: 09/07/1946  Chief Complaint:  Admitted with abnormal outpatient labs - hyperkalemia   Subjective:  Had 150 mL reported on bladder scan yesterday.  Patient states difficulty urinating.  She has been transferred to rehab.  Feels more like herself       Review of systems:   Denies shortness of breath or chest pain  Denies n/v Appetite is better ---------------------- Background on consult:  Rhonda Hale is a 73 y.o. female history including ovarian cancer previously treated with paclitaxel and Avastin who came into the ER after outpatient labs demonstrated hyperkalemia and worsening renal function.  Her potassium was 5.9 and creatinine was improved to 1.39 on presentation.  Hyperkalemia has resolved.  She has had altered mental status and neurology has been consulted for the same.  She had been doing better however this morning was more confused.  Her daughter is at bedside and states that she has been seeing things in the room but has been oriented.  She was seen recently during prior admission for AKI which was felt ischemic insults from hypotension at that time.  Note that she had been on D5W which was discontinued this morning upon receipt of the abnormal labs per the team.  She has also been on an SSRI and reports that she has been on this for several years.  Appetite has not been great.  She ate less last night.  She has not had any nausea or vomiting.  Her daughter has noticed decreased urine output as they have been changing her less frequently.  She is incontinent of urine at baseline.  She does not use NSAIDs for at least the past 6 months and only used intermittently prior to that.  Noted to have developed AKI with Cr 2.46 today.    Intake/Output Summary (Last 24 hours) at 08/03/2019 1428 Last data filed at 08/03/2019 1300 Gross per 24 hour  Intake 600 ml  Output -  Net 600 ml    Vitals:  Vitals:   08/02/19 1606 08/02/19 1959 08/03/19 0450 08/03/19 1416  BP: (!) 139/55 (!) 152/65 136/71 (!) 141/73  Pulse: 78 77 84 80  Resp: 18 19 19 18   Temp: 98.5 F (36.9 C) 98.9 F (37.2 C) (!) 97.4 F (36.3 C) 98.6 F (37 C)  TempSrc: Oral Oral  Oral  SpO2: 97% 98% 100% 98%  Weight: 72.5 kg     Height: 5\' 2"  (1.575 m)        Physical Exam:  General adult female in bed in no acute distress  HEENT normocephalic atraumatic extraocular movements intact sclera anicteric Neck supple trachea midline Lungs clear to auscultation bilaterally normal work of breathing at rest  Heart regular rate and rhythm no rubs or gallops appreciated Abdomen soft nontender nondistended Extremities no edema  Psych normal mood and affect Neuro - more alert; she is oriented x 3; conversant  Medications reviewed   Labs:  BMP Latest Ref Rng & Units 08/03/2019 08/02/2019 08/01/2019  Glucose 70 - 99 mg/dL 161(H) 129(H) 204(H)  BUN 8 - 23 mg/dL 11 31(H) 53(H)  Creatinine 0.44 - 1.00 mg/dL 0.86 1.13(H) 1.85(H)  BUN/Creat Ratio 12 - 28 - - -  Sodium 135 - 145 mmol/L 137 133(L) 129(L)  Potassium 3.5 - 5.1 mmol/L 3.9 3.4(L) 3.9  Chloride 98 - 111 mmol/L 104 104 101  CO2 22 - 32 mmol/L 22 19(L) 17(L)  Calcium 8.9 - 10.3 mg/dL 8.4(L) 8.1(L)  8.1(L)     Assessment/Plan:   # Hyponatremia - Secondary in part to hypotonic fluids given the acute worsening after the fluids.  Cannot r/o in part paraneoplastic however also on SSRI which is now off - Resolved - Remain off of SSRI   # AKI  - Has had ischemic insults/ATN with mild hypotension, pre-renal insults, as well as retention - Resolved  - Repeat post-void bladder scan and in/out cath if retention (order placed) - Hx of recent AKI which was felt in part 2/2 ATN with hypotension   # AMS - improving   # Hyperglycemia - improved; per primary team   # CKD stage III - baseline Cr is 1.1 - 1.3  # Ovarian cancer - note s/p paclitaxel and avastin   #  Hyperkalemia resolved   # Metabolic acidosis - on sodium bicarbonate; improved.   - discontinue sodium bicarbonate  # Bone mineral metabolism  - continue home calcitriol - managed by endocrinology in [place] (Dr. Anderson [place])   # Anemia normocytic  - Note downward trend   - No acute indication for PRBC's  - Per primary team   Will sign off.  Please monitor for urinary retention as above; she had elevated post-void but did not require in/out cath.  Please do not hesitate to contact us with questions.   Claudia Desanctis, MD 08/03/2019 2:28 PM

## 2019-08-03 NOTE — Evaluation (Signed)
Physical Therapy Assessment and Plan  Patient Details  Name: Rhonda Hale MRN: [id number] Date of Birth: 08/24/1946  PT Diagnosis: Ataxia, Cognitive deficits, Difficulty walking, Muscle weakness and Pain in joint Rehab Potential: Good ELOS: 7-10 days   Today's Date: 08/03/2019 PT Individual Time: 1255-1405 PT Individual Time Calculation (min): 70 min    Problem List:  Patient Active Problem List   Diagnosis Date Noted  . Toxic metabolic encephalopathy 18/84/1660  . Hyponatremia 07/31/2019  . AMS (altered mental status) 07/27/2019  . Dehydration 07/26/2019  . PRES (posterior reversible encephalopathy syndrome) 07/07/2019  . AKI (acute kidney injury) ([place]) 07/03/2019  . Hypercalcemia 07/03/2019  . Hypomagnesemia, chronic 07/03/2019  . Anticoagulated 04/15/2019  . Ovary cancer ([place]) 07/03/2017  . Postoperative hypothyroidism 01/01/2013    Past Medical History:  Past Medical History:  Diagnosis Date  . Cancer ([place])   . COPD (chronic obstructive pulmonary disease) ([place])   . Hypertension    Past Surgical History:  Past Surgical History:  Procedure Laterality Date  . ABDOMINAL HYSTERECTOMY N/A     Assessment & Plan Clinical Impression: Patient is a 73 year old right-handed female with history of pulmonary emboli maintained on anticoagulation, bone mineral metabolism followed by endocrinology services [place], metastatic ovarian cancer status post hysterectomy as well as chemotherapy and microcytic anemia, CKD stage III with baseline creatinine 1.1-1.3, as well as recent admission 07/03/2019 to 07/17/2019 for 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 She was discharged home with assistance of her family independent with ADLs using assistive device. Patient was completing a course of Bactrim as well as low-dose steroids. Presented 07/26/2019 with altered mental status, tremors as well as  decrease in appetite. Daughter reports urinary and fecal incontinence at baseline. In the ED potassium 5.9, sodium 127, BUN 36, creatinine 1.80, COVID negative, urine culture 80,000 Staphylococcus epidermidis, WBC 14,600, hemoglobin 9.1, ammonia level 18. Chest x-ray small left pleural effusion and left lung base atelectasis or infiltrate, cranial CT/MRI scan negative for acute changes. No intracranial mass or hemorrhage. No correlation seen on head CT or imaging suggesting posterior reversible encephalopathy syndrome. Patient with noted bouts of mild clonus and jerking with EEG negative for seizure. Follow-up neurology services suspect acute toxic metabolic encephalopathy. Renal service follow-up for hyponatremia and hyperkalemia. SSRI held due to hyponatremia. Shedid respond well to normal saline at 75 an hour after switch from D5Wand latest sodium 133. Her mental status continues to improve and she continues on low-dose Seroquel. Tolerating a regular diet. Patient transferred to CIR on 08/02/2019 .   Patient currently requires min with mobility secondary to muscle weakness, decreased cardiorespiratoy endurance, impaired timing and sequencing, ataxia and decreased coordination, decreased attention, decreased awareness, decreased problem solving, decreased safety awareness and decreased memory and decreased standing balance, decreased postural control and decreased balance strategies.  Prior to hospitalization, patient was independent until a few weeks ago and then requiring assist with mobility and ADLs with mobility and lived with Daughter in a House home.  Home access is  Level entry.  Patient will benefit from skilled PT intervention to maximize safe functional mobility, minimize fall risk and decrease caregiver burden for planned discharge home with 24 hour assist.  Anticipate patient will benefit from follow up [facility] at discharge.  PT - End of Session Activity Tolerance: Decreased this  session Endurance Deficit: Yes PT Assessment Rehab Potential (ACUTE/IP ONLY): Good PT Patient demonstrates impairments in the following area(s): Balance;Endurance;Motor;Pain;Safety;Skin Integrity PT Transfers  Functional Problem(s): Bed Mobility;Bed to Chair;Car;Furniture PT Locomotion Functional Problem(s): Ambulation;Stairs PT Plan PT Intensity: Minimum of 1-2 x/day ,45 to 90 minutes PT Frequency: 5 out of 7 days(will need spaced out) PT Duration Estimated Length of Stay: 7-10 days PT Treatment/Interventions: Ambulation/gait training;Balance/vestibular training;Cognitive remediation/compensation;Community reintegration;Disease management/prevention;Discharge planning;DME/adaptive equipment instruction;Functional mobility training;Neuromuscular re-education;Pain management;Patient/family education;Psychosocial support;Skin care/wound management;Stair training;Therapeutic Activities;Therapeutic Exercise;UE/LE Strength taining/ROM;UE/LE Coordination activities;Wheelchair propulsion/positioning PT Transfers Anticipated Outcome(s): supervision overall PT Locomotion Anticipated Outcome(s): supervision household gait distances; min assist stairs PT Recommendation Recommendations for Other Services: Neuropsych consult(coping) Follow Up Recommendations: 24 hour supervision/assistance;Home health PT Patient destination: Home Equipment Recommended: (w/c for community)  Skilled Therapeutic Intervention Evaluation completed (see details above and below) with education on PT POC and goals and individual treatment initiated with focus on education in regards to energy conservation, functional transfer training with and without AD, gait training with and without AD, stair negotiation, and simulated car transfer to prepare for home. Assisted pt with set up of lunch tray upon arrival as not very willing to get up and out of bed at first due to being so tired. With encouragement, agreeable and discussed options  for set schedule or spacing out therapies. Will need to continue to monitor with primary team over the next couple days. Pt demonstrates tangential speech, easily distracted and decreased attention, poor safety awareness, decreased memory and impaired problem solving throughout session but able to be redirected and responds well to cueing and going slow with activities. Introduced RW for gait trial x 30' x 2 with seated rest break with overall min assist, noted narrow BOS and ataxic gait pattern but improved weightshifting compared to HHA and able to increase gait distance. End of session request to return to bed and able to do so with supervision for bed mobility.    PT Evaluation Precautions/Restrictions Precautions Precautions: Fall Restrictions Weight Bearing Restrictions: No Pain  c/o pain in L side of neck. Premedicated.  Home Living/Prior Functioning Home Living Available Help at Discharge: Family;Available 24 hours/day Type of Home: House Home Access: Level entry Home Layout: Two level;1/2 bath on main level Additional Comments: Pt living with daughter while undergoing chemo.  Lives With: Daughter Prior Function Comments: Independent up till four weeks ago and has required assist for BADLs and transfers Vision/Perception  Vision - Assessment Eye Alignment: Within Functional Limits Ocular Range of Motion: Within Functional Limits Alignment/Gaze Preference: Within Defined Limits Tracking/Visual Pursuits: Able to track stimulus in all quads without difficulty Saccades: Within functional limits Convergence: Impaired (comment)(L eye minimal convergence) Perception Perception: Within Functional Limits Praxis Praxis: Impaired Praxis Impairment Details: Initiation  Cognition Overall Cognitive Status: Impaired/Different from baseline Arousal/Alertness: Awake/alert Orientation Level: Oriented to person;Oriented to place;Disoriented to time;Disoriented to situation Attention:  Sustained Sustained Attention: Impaired Sustained Attention Impairment: Verbal basic;Functional basic Memory: Impaired Memory Impairment: Retrieval deficit;Decreased short term memory Decreased Short Term Memory: Functional basic;Verbal basic Awareness: Impaired Awareness Impairment: Intellectual impairment Problem Solving: Impaired Problem Solving Impairment: Verbal basic;Functional complex Safety/Judgment: Impaired Sensation Sensation Light Touch: Appears Intact Proprioception: Appears Intact Coordination Gross Motor Movements are Fluid and Coordinated: No Fine Motor Movements are Fluid and Coordinated: No Coordination and Movement Description: tremulous movement BUE Motor  Motor Motor: Ataxia;Abnormal postural alignment and control  Mobility Bed Mobility Bed Mobility: Rolling Right;Supine to Sit;Sit to Supine Rolling Right: Supervision/verbal cueing Supine to Sit: Contact Guard/Touching assist Sit to Supine: Supervision/Verbal cueing Transfers Transfers: Sit to Stand;Stand to Sit;Stand Pivot Transfers Sit to Stand: Minimal Assistance - Patient > 75% Stand  to Sit: Minimal Assistance - Patient > 75% Stand Pivot Transfers: Minimal Assistance - Patient > 75% Stand Pivot Transfer Details: Tactile cues for sequencing;Tactile cues for weight shifting;Tactile cues for posture;Verbal cues for technique;Verbal cues for precautions/safety Transfer (Assistive device): 1 person hand held assist Locomotion  Gait Gait Distance (Feet): 15 Feet Assistive device: (HHA) Gait Gait Pattern: Impaired; narrow BOS, ataxic, decreased weightshifting to R and L, slow cadence Stairs / Additional Locomotion Stairs: Yes Stairs Assistance: Moderate Assistance - Patient 50 - 74% Stair Management Technique: Two rails Number of Stairs: 1 Height of Stairs: 6 Wheelchair Mobility Wheelchair Mobility: No(low endurance) Wheelchair Assistance: Dependent - Patient 0%  Trunk/Postural Assessment  Cervical  Assessment Cervical Assessment: Exceptions to WFL(pain and tightness in L) Thoracic Assessment Thoracic Assessment: Exceptions to WFL(flexed posture) Lumbar Assessment Lumbar Assessment: Exceptions to WFL(posterior pelvic tilt) Postural Control Postural Control: Deficits on evaluation  Balance Balance Balance Assessed: Yes Static Sitting Balance Static Sitting - Level of Assistance: 5: Stand by assistance Dynamic Sitting Balance Dynamic Sitting - Balance Support: Feet supported Dynamic Sitting - Level of Assistance: 5: Stand by assistance Static Standing Balance Static Standing - Level of Assistance: 4: Min assist Dynamic Standing Balance Dynamic Standing - Balance Support: Right upper extremity supported;During functional activity Dynamic Standing - Level of Assistance: 4: Min assist Dynamic Standing - Comments: dressing Extremity Assessment  RUE Assessment RUE Assessment: Exceptions to [facility] General Strength Comments: generalized weakenss LUE Assessment LUE Assessment: Exceptions to [facility] General Strength Comments: generalized weakness RLE Assessment RLE Assessment: Exceptions to [facility] General Strength Comments: grossly 4/5; decreased muscular endurance and coordination LLE Assessment LLE Assessment: Exceptions to [facility] General Strength Comments: grossly 4/5; decreased msucular endurance and coordination    Refer to Care Plan for Long Term Goals  Recommendations for other services: Neuropsych  Discharge Criteria: Patient will be discharged from PT if patient refuses treatment 3 consecutive times without medical reason, if treatment goals not met, if there is a change in medical status, if patient makes no progress towards goals or if patient is discharged from hospital.  The above assessment, treatment plan, treatment alternatives and goals were discussed and mutually agreed upon: by patient and by family  Juanna Cao, PT, DPT, CBIS  08/03/2019, 3:35  PM

## 2019-08-03 NOTE — Evaluation (Addendum)
Occupational Therapy Assessment and Plan  Patient Details  Name: Rhonda Hale MRN: [id number] Date of Birth: 25-Nov-1946  OT Diagnosis: abnormal posture, cognitive deficits and muscle weakness (generalized) Rehab Potential: Rehab Potential (ACUTE ONLY): Good ELOS: 7-10   Today's Date: 08/03/2019 OT Individual Time: 1045-1200 OT Individual Time Calculation (min): 75 min     Problem List:  Patient Active Problem List   Diagnosis Date Noted  . Toxic metabolic encephalopathy 68/34/1962  . Hyponatremia 07/31/2019  . Encephalopathy 07/27/2019  . AMS (altered mental status) 07/27/2019  . Hyperkalemia 07/26/2019  . Dehydration 07/26/2019  . Multifocal pneumonia 07/13/2019  . PRES (posterior reversible encephalopathy syndrome) 07/07/2019  . AKI (acute kidney injury) ([place]) 07/03/2019  . Hypercalcemia 07/03/2019  . Hypomagnesemia, chronic 07/03/2019    Past Medical History:  Past Medical History:  Diagnosis Date  . Cancer ([place])   . COPD (chronic obstructive pulmonary disease) ([place])   . Hypertension    Past Surgical History:  Past Surgical History:  Procedure Laterality Date  . ABDOMINAL HYSTERECTOMY N/A     Assessment & Plan Clinical Impression: Rhonda Hale is a 73 year old right-handed female with history of pulmonary emboli maintained on anticoagulation, bone mineral metabolism followed by endocrinology services [place], metastatic ovarian cancer status post hysterectomy as well as chemotherapy and microcytic anemia, CKD stage III with baseline creatinine 1.1-1.3, as well as recent admission 07/03/2019 to 07/17/2019 for 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 She was discharged home with assistance of her family independent with ADLs using assistive device. Patient was completing a course of Bactrim as well as low-dose steroids. Presented 07/26/2019 with altered mental status, tremors as  well as decrease in appetite. Daughter reports urinary and fecal incontinence at baseline. In the ED potassium 5.9, sodium 127, BUN 36, creatinine 1.80, COVID negative, urine culture 80,000 Staphylococcus epidermidis, WBC 14,600, hemoglobin 9.1, ammonia level 18. Chest x-ray small left pleural effusion and left lung base atelectasis or infiltrate, cranial CT/MRI scan negative for acute changes. No intracranial mass or hemorrhage. No correlation seen on head CT or imaging suggesting posterior reversible encephalopathy syndrome. Patient with noted bouts of mild clonus and jerking with EEG negative for seizure. Follow-up neurology services suspect acute toxic metabolic encephalopathy. Renal service follow-up for hyponatremia and hyperkalemia. SSRI held due to hyponatremia. Shedid respond well to normal saline at 75 an hour after switch from D5Wand latest sodium 133. Her mental status continues to improve and she continues on low-dose Seroquel.   Patient currently requires min with basic self-care skills secondary to muscle weakness, decreased cardiorespiratoy endurance, impaired timing and sequencing, ataxia and decreased coordination, decreased visual motor skills, decreased initiation, decreased attention, decreased awareness, decreased problem solving, decreased safety awareness, decreased memory and delayed processing and decreased sitting balance, decreased standing balance, decreased postural control and decreased balance strategies.  Prior to hospitalization, patient could complete BADL with independent .  Patient will benefit from skilled intervention to decrease level of assist with basic self-care skills prior to discharge home with care partner.  Anticipate patient will require 24 hour supervision and follow up home health.  OT - End of Session Activity Tolerance: Tolerates 10 - 20 min activity with multiple rests Endurance Deficit: Yes OT Assessment Rehab Potential (ACUTE ONLY):  Good OT Barriers to Discharge: Incontinence;Pending chemo/radiation OT Patient demonstrates impairments in the following area(s): Safety;Balance;Cognition;Endurance;Motor;Pain;Perception;Vision OT Basic ADL's Functional Problem(s): Grooming;Bathing;Dressing;Toileting OT Transfers Functional Problem(s): Toilet;Tub/Shower OT Additional Impairment(s): None OT Plan OT  Intensity: Minimum of 1-2 x/day, 45 to 90 minutes OT Frequency: 5 out of 7 days OT Duration/Estimated Length of Stay: 7-10 OT Treatment/Interventions: Balance/vestibular training;Discharge planning;Self Care/advanced ADL retraining;Pain management;Therapeutic Activities;UE/LE Coordination activities;Cognitive remediation/compensation;Disease mangement/prevention;Functional mobility training;Patient/family education;Skin care/wound managment;Therapeutic Exercise;Visual/perceptual remediation/compensation;Community reintegration;DME/adaptive equipment instruction;Neuromuscular re-education;Psychosocial support;Splinting/orthotics;UE/LE Strength taining/ROM;Wheelchair propulsion/positioning OT Self Feeding Anticipated Outcome(s): S OT Basic Self-Care Anticipated Outcome(s): S OT Toileting Anticipated Outcome(s): S OT Bathroom Transfers Anticipated Outcome(s): S OT Recommendation Patient destination: Home Follow Up Recommendations: Home health OT Equipment Recommended: None recommended by OT   Skilled Therapeutic Intervention 1:1. Pt received in bed agreeable to OT after education on role/purpose, CIR, ELOS and POC. Pt does not report pain. Pt completes transfers with MIN A with min HHA into shower and toilet. Pt bathes as dresses as reported below requiring max VC for sequencing and for awareness of safe reaching during transfers. Pt demo decreased attention to tasks and often is distracted by being cold. Pt and daughter confirm PLOF and home set up as well as discuss d/c plan with OT. Daughter reporting putting in shower in downstairs  half bath for pt to use upon d/c. Pt very fatiged at end of BADL and demo decreased awareness of deficits reporting not needing any assistance with any tasks during session therefore not understanding OT recommendation to attempt to use AE. Exited session with pt seated in bed exit alarm on and call light in reach.  Addendum: Per H&P and verbal confirmation from RN, Santiago Glad, Pt able to shower with occlusive applied to port.  Waterproof dressing utilized in Scientific laboratory technician Precautions/Restrictions  Precautions Precautions: Fall Precaution Comments: has been having trunk extension "tone" with standing previously Restrictions Weight Bearing Restrictions: No Other Position/Activity Restrictions: pts daughter present 24/7 and is a Glass blower/designer Chart Reviewed: Yes Family/Caregiver Present: Yes Vital Signs  Pain Pain Assessment Pain Scale: 0-10 Pain Score: 8  Pain Type: Acute pain Pain Location: Neck Pain Orientation: Left Pain Descriptors / Indicators: Aching Pain Frequency: Occasional Pain Onset: Gradual Pain Intervention(s): Medication (See eMAR) Home Living/Prior [place] expects to be discharged to:: Private residence Living Arrangements: Children Available Help at Discharge: Family, Available 24 hours/day Type of Home: House Home Access: Level entry Home Layout: Two level, 1/2 bath on main level Alternate Level Stairs-Rails: Right Bathroom Shower/Tub: Multimedia programmer: Standard Additional Comments: Pt living with daughter while undergoing chemo.  Lives With: Daughter IADL History Leisure and Hobbies: knitting, reading, dog to take care of Prior Function Comments: Independent up till four weeks ago and has required assist for BADLs and transfers ADL ADL Grooming: Supervision/safety Where Assessed-Grooming: Sitting at sink Upper Body Bathing: Supervision/safety Where Assessed-Upper Body Bathing: Shower Lower Body  Bathing: Minimal assistance Where Assessed-Lower Body Bathing: Shower Upper Body Dressing: Minimal assistance Where Assessed-Upper Body Dressing: Sitting at sink Lower Body Dressing: Minimal assistance Where Assessed-Lower Body Dressing: Sitting at sink, Standing at sink Toileting: Minimal assistance Where Assessed-Toileting: Glass blower/designer: Psychiatric nurse Method: Arts development officer: Raised toilet seat Social research officer, government: Environmental education officer Method: Public house manager with back Vision Baseline Vision/History: Wears glasses Wears Glasses: Reading only Patient Visual Report: No change from baseline Vision Assessment?: Yes Eye Alignment: Within Functional Limits Ocular Range of Motion: Within Functional Limits Alignment/Gaze Preference: Within Defined Limits Tracking/Visual Pursuits: Able to track stimulus in all quads without difficulty Saccades: Within functional limits Convergence: Impaired (comment)(L eye minimal convergence) Perception  Perception: Within  Functional Limits Praxis Praxis: Impaired Praxis Impairment Details: Initiation Cognition Overall Cognitive Status: Impaired/Different from baseline Arousal/Alertness: Awake/alert Orientation Level: Person;Place;Situation Person: Oriented Place: Oriented Situation: Oriented Year: Other (Comment)(2002) Month: August Day of Week: Incorrect Memory Impairment: Retrieval deficit Immediate Memory Recall: Sock;Blue;Bed Memory Recall Sock: Without Cue Memory Recall Blue: With Cue Awareness Impairment: Intellectual impairment Problem Solving: Impaired Safety/Judgment: Impaired Sensation Sensation Light Touch: Appears Intact(difficulty with R/L discrimination) Coordination Gross Motor Movements are Fluid and Coordinated: No Fine Motor Movements are Fluid and Coordinated: No Coordination and Movement Description:  tremulous movement BUE Motor  Motor Motor: Ataxia;Abnormal postural alignment and control Mobility  Transfers Sit to Stand: Minimal Assistance - Patient > 75% Stand to Sit: Minimal Assistance - Patient > 75%  Trunk/Postural Assessment  Cervical Assessment Cervical Assessment: Exceptions to WFL(head forward) Thoracic Assessment Thoracic Assessment: Exceptions to WFL(rounded shoudlers) Lumbar Assessment Lumbar Assessment: (posterior pelvic tilt) Postural Control Postural Control: Deficits on evaluation(delayed)  Balance Balance Balance Assessed: Yes Dynamic Sitting Balance Dynamic Sitting - Balance Support: Feet supported Dynamic Sitting - Level of Assistance: 5: Stand by assistance Dynamic Standing Balance Dynamic Standing - Balance Support: Right upper extremity supported;During functional activity Dynamic Standing - Level of Assistance: 4: Min assist Dynamic Standing - Comments: dressing Extremity/Trunk Assessment RUE Assessment RUE Assessment: Exceptions to [facility] General Strength Comments: generalized weakenss LUE Assessment LUE Assessment: Exceptions to [facility] General Strength Comments: generalized weakness     Refer to Care Plan for Long Term Goals  Recommendations for other services: Therapeutic Recreation  Pet therapy and Stress management   Discharge Criteria: Patient will be discharged from OT if patient refuses treatment 3 consecutive times without medical reason, if treatment goals not met, if there is a change in medical status, if patient makes no progress towards goals or if patient is discharged from hospital.  The above assessment, treatment plan, treatment alternatives and goals were discussed and mutually agreed upon: by patient  Tonny Branch 08/03/2019, 12:02 PM

## 2019-08-03 NOTE — Progress Notes (Signed)
Evaluating the patient for possible adrenal insufficiency. ACTH stem test was performed today with IV cortisol level of 12, 60 minutes after administration. This indicates that the patient has adrenal insufficiency. This is likely the primary adrenal insufficiency given that the patient's cortisol did not respond appropriately. She had a CT abdomen in July it did not show any metastatic cancer to her adrenal glands. I do not feel that we need to repeat the CT at this time.  Although the patient's stimulation test is consistent with adrenal insufficiency, I would hold off on treatment right now. She is currently hypertensive requiring antihypertensive medications therefore mineralocorticoid replacement could cause more harm than good. Her labs are also not indicative of a mineralocorticoid deficient state.  I would recommend repeating her stimulation test in 1 to 2 weeks. If the patient becomes hypotensive during this time I would give stress dose hydrocortisone. Otherwise continue to follow clinically. I have discussed the case with my attending.   [name], MD  [facility] PGY3  Pager: [phone]

## 2019-08-03 NOTE — Progress Notes (Signed)
Rhonda Hale is a 73 y.o. female [birth year] [id number]  Subjective: Feeling well today.  Confusion has improved per patient and daughter.  Sleep still disrupted with non-sensible conversation.  Looking forward to rehab therapies as scheduled today  Objective: Vital signs in last 24 hours: Temp:  [97.4 F (36.3 C)-98.9 F (37.2 C)] 97.4 F (36.3 C) (08/22 0450) Pulse Rate:  [77-84] 84 (08/22 0450) Resp:  [18-19] 19 (08/22 0450) BP: (136-152)/(55-71) 136/71 (08/22 0450) SpO2:  [97 %-100 %] 100 % (08/22 0450) Weight:  [72.5 kg] 72.5 kg (08/21 1606) Weight change:  Last BM Date: 08/02/19  Intake/Output from previous day: 08/21 0701 - 08/22 0700 In: 120 [P.O.:120] Out: -   Physical Exam General: No apparent distress   Dtr at side Lungs: Normal effort. Lungs clear to auscultation, no crackles or wheezes. Cardiovascular: Regular rate and rhythm, no edema Neurological: No new neurological deficits   Lab Results: BMET    Component Value Date/Time   NA 137 08/03/2019 1123   NA 131 (L) 07/24/2019 1144   K 3.9 08/03/2019 1123   CL 104 08/03/2019 1123   CO2 22 08/03/2019 1123   GLUCOSE 161 (H) 08/03/2019 1123   BUN 11 08/03/2019 1123   BUN 25 07/24/2019 1144   CREATININE 0.86 08/03/2019 1123   CALCIUM 8.4 (L) 08/03/2019 1123   GFRNONAA >60 08/03/2019 1123   GFRAA >60 08/03/2019 1123   CBC    Component Value Date/Time   WBC 13.6 (H) 08/02/2019 0400   RBC 2.60 (L) 08/02/2019 0400   HGB 8.1 (L) 08/02/2019 0400   HGB 10.1 (L) 07/24/2019 1144   HCT 24.0 (L) 08/02/2019 0400   HCT 31.4 (L) 07/24/2019 1144   PLT 225 08/02/2019 0400   PLT 300 07/24/2019 1144   MCV 92.3 08/02/2019 0400   MCV 99 (H) 07/24/2019 1144   MCH 31.2 08/02/2019 0400   MCHC 33.8 08/02/2019 0400   RDW 20.3 (H) 08/02/2019 0400   RDW 19.8 (H) 07/24/2019 1144   LYMPHSABS 0.4 (L) 07/31/2019 0410   MONOABS 0.4 07/31/2019 0410   EOSABS 0.0 07/31/2019 0410   BASOSABS 0.0 07/31/2019 0410   CBG's (last  3):   Recent Labs    08/02/19 1648  GLUCAP 147*   LFT's Lab Results  Component Value Date   ALT 29 07/27/2019   AST 24 07/27/2019   ALKPHOS 40 07/27/2019   BILITOT <0.1 (L) 07/27/2019    Studies/Results: No results found.  Medications:  I have reviewed the patient's current medications. Scheduled Medications: . amLODipine  10 mg Oral Daily  . apixaban  5 mg Oral BID  . calcitRIOL  0.5 mcg Oral Daily  . feeding supplement (ENSURE ENLIVE)  237 mL Oral BID BM  . labetalol  50 mg Oral BID  . levothyroxine  150 mcg Oral QAC breakfast  . megestrol  40 mg Oral BID  . multivitamin with minerals  1 tablet Oral Daily  . QUEtiapine  25 mg Oral QHS  . sodium bicarbonate  650 mg Oral BID  . sodium chloride flush  10-40 mL Intracatheter Q12H  . umeclidinium bromide  1 puff Inhalation Daily   PRN Medications: acetaminophen **OR** acetaminophen, ondansetron **OR** ondansetron (ZOFRAN) IV, sodium chloride flush, sorbitol  Assessment/Plan: Principal Problem:   Toxic metabolic encephalopathy Active Problems:   AMS (altered mental status)   Anticoagulated   Postoperative hypothyroidism   Ovary cancer (HCC)   Length of stay, days: 1  1.  Debilitation and altered mental status  related to toxic encephalopathy.  Admitted to inpatient rehab for intensive PT, OT and support while continuing management of chronic comorbidities.  Mental status appears improved but unable to make decisions on her own behalf at this time.  Continue Seroquel at bedtime 2.  Metastatic ovarian cancer status post hysterectomy with ongoing recent chemotherapy.  Outpatient oncology follow-up post discharge 3.  Anemia of chronic disease no evidence of ongoing bleeding, maintained on chronic anticoagulation because of history of PE 4/20 and active malignancy 4.  Hypertension.  Reasonably controlled on current therapies.  Monitor and titrate as needed 5.  Hypothyroidism.  Continue supplement replacement as  ongoing  Valerie A. Asa Lente, MD 08/03/2019, 1:20 PM

## 2019-08-04 ENCOUNTER — Inpatient Hospital Stay (HOSPITAL_COMMUNITY): Payer: Medicare Other | Admitting: Speech Pathology

## 2019-08-04 DIAGNOSIS — R4182 Altered mental status, unspecified: Secondary | ICD-10-CM

## 2019-08-04 NOTE — Progress Notes (Signed)
Daughter left hospital yesterday. Pt reports that daughter to come back with food. Informed pt that do not allow visitors to come and go. Daughter did not return during my shift.  However, this am daughter in room and reports that was told could spend the night with pt.  Night nurse did have daughter sign visitor form. Reviewed notes and do not see note from admission coordinator about daughter staying the night.

## 2019-08-04 NOTE — Progress Notes (Signed)
PVR 328, Pt upset with NT that was asleep and told that needed to get up to [facility]. Informed pt that we had this conversation earlier and that would be getting up to [facility].  Pt still upset and refusing to be cath'd at this time.  Informed her that can try again later and will massage bladder to encourage emptying. Pt reports that she empties and doesn't need to be cath'd . Informed again of what we will try later.

## 2019-08-04 NOTE — Progress Notes (Signed)
Speech Language Pathology Daily Session Note  Patient Details  Name: Rhonda Hale MRN: [id number] Date of Birth: 12-17-1945  Today's Date: 08/04/2019 SLP Individual Time: [id number] SLP Individual Time Calculation (min): 40 min  Short Term Goals: Week 1: SLP Short Term Goal 1 (Week 1): STGs=LTGs due to short length of stay  Skilled Therapeutic Interventions:  Pt was seen for skilled ST targeting cognitive goals.  Pt was awake upon therapist's arrival but reports being generally fatigued from yesterday's therapy sessions.  Pt was oriented to place and date (day of the week, month, and year) but when asked about the circumstances surrounding her hospitalization she reported "I have no idea."  Pt's verbal output was vague and circumlocutory during conversations with therapist; however I question whether this is secondary to cognitive impairment versus a primary language deficit.  Pt also exhibited decreased recall of daily information.  When therapist offered to start a memory book for pt to improve recall of daily events, pt declined stating "I just know I won't use that."  When asked what sort of memory compensatory strategies she used at baseline, pt reported that she used a calendar and she was agreeable to therapist posting a calendar in her room as she reported that the days "run together" since being hospitalized.  Pt was left in bed with bed alarm set and call bell within reach.  Continue per current plan  of care.     Pain Pain Assessment Pain Scale: 0-10 Pain Score: 0-No pain   Therapy/Group: Individual Therapy  Ritamarie Arkin, Selinda Orion 08/04/2019, 3:16 PM

## 2019-08-04 NOTE — Progress Notes (Signed)
Rhonda Hale is a 73 y.o. female Aug 31, 1946 [id number]  Subjective: Sleeping at my visit - communication w/ dtr indicates confusion greatest in the mornings but generally MS is improving   Objective: Vital signs in last 24 hours: Temp:  [98.3 F (36.8 C)-98.6 F (37 C)] 98.4 F (36.9 C) (08/23 0545) Pulse Rate:  [76-90] 76 (08/23 0545) Resp:  [18-19] 19 (08/23 0545) BP: (127-146)/(73-75) 146/75 (08/23 0545) SpO2:  [95 %-98 %] 95 % (08/23 0915) Weight change:  Last BM Date: 08/03/19  Intake/Output from previous day: 08/22 0701 - 08/23 0700 In: 592 [P.O.:592] Out: -   Physical Exam General: No apparent distress  Sleeping Lungs: Normal effort. Lungs clear to auscultation, no crackles or wheezes. Cardiovascular: Regular rate and rhythm, no edema Neurological: No new neurological deficits   Lab Results: BMET    Component Value Date/Time   NA 137 08/03/2019 1123   NA 131 (L) 07/24/2019 1144   K 3.9 08/03/2019 1123   CL 104 08/03/2019 1123   CO2 22 08/03/2019 1123   GLUCOSE 161 (H) 08/03/2019 1123   BUN 11 08/03/2019 1123   BUN 25 07/24/2019 1144   CREATININE 0.86 08/03/2019 1123   CALCIUM 8.4 (L) 08/03/2019 1123   GFRNONAA >60 08/03/2019 1123   GFRAA >60 08/03/2019 1123   CBC    Component Value Date/Time   WBC 13.6 (H) 08/02/2019 0400   RBC 2.60 (L) 08/02/2019 0400   HGB 8.1 (L) 08/02/2019 0400   HGB 10.1 (L) 07/24/2019 1144   HCT 24.0 (L) 08/02/2019 0400   HCT 31.4 (L) 07/24/2019 1144   PLT 225 08/02/2019 0400   PLT 300 07/24/2019 1144   MCV 92.3 08/02/2019 0400   MCV 99 (H) 07/24/2019 1144   MCH 31.2 08/02/2019 0400   MCHC 33.8 08/02/2019 0400   RDW 20.3 (H) 08/02/2019 0400   RDW 19.8 (H) 07/24/2019 1144   LYMPHSABS 0.4 (L) 07/31/2019 0410   MONOABS 0.4 07/31/2019 0410   EOSABS 0.0 07/31/2019 0410   BASOSABS 0.0 07/31/2019 0410   CBG's (last 3):   Recent Labs    08/02/19 1648  GLUCAP 147*   LFT's Lab Results  Component Value Date   ALT 29  07/27/2019   AST 24 07/27/2019   ALKPHOS 40 07/27/2019   BILITOT <0.1 (L) 07/27/2019    Studies/Results: No results found.  Medications:  I have reviewed the patient's current medications. Scheduled Medications: . amLODipine  10 mg Oral Daily  . apixaban  5 mg Oral BID  . calcitRIOL  0.5 mcg Oral Daily  . feeding supplement (ENSURE ENLIVE)  237 mL Oral BID BM  . labetalol  50 mg Oral BID  . levothyroxine  150 mcg Oral QAC breakfast  . megestrol  40 mg Oral BID  . multivitamin with minerals  1 tablet Oral Daily  . QUEtiapine  25 mg Oral QHS  . sodium chloride flush  10-40 mL Intracatheter Q12H  . umeclidinium bromide  1 puff Inhalation Daily   PRN Medications: acetaminophen **OR** acetaminophen, ondansetron **OR** ondansetron (ZOFRAN) IV, sodium chloride flush, sorbitol  Assessment/Plan: Principal Problem:   Toxic metabolic encephalopathy Active Problems:   AMS (altered mental status)   Anticoagulated   Postoperative hypothyroidism   Ovary cancer (HCC)   Length of stay, days: 2  1.  Debilitation and altered mental status related to toxic encephalopathy.  Admitted to inpatient rehab for intensive PT, OT and support while continuing management of chronic comorbidities.  Mental status appears improved but  unable to make decisions on her own behalf at this time.  Continue Seroquel at bedtime 2.  Metastatic ovarian cancer status post hysterectomy with ongoing recent chemotherapy (s/p paclitaxel and avastin).  Outpatient oncology follow-up post discharge 3.  Anemia of chronic disease no evidence of ongoing bleeding, maintained on chronic anticoagulation because of history of PE 4/20 and active malignancy 4.  Hypertension.  Reasonably controlled on current therapies.  Monitor and titrate as needed 5.  Hypothyroidism.  Continue supplement replacement as ongoing  Rhonda Hale A. Asa Lente, MD 08/04/2019, 10:36 AM

## 2019-08-05 ENCOUNTER — Inpatient Hospital Stay (HOSPITAL_COMMUNITY): Payer: Medicare Other | Admitting: Physical Therapy

## 2019-08-05 ENCOUNTER — Inpatient Hospital Stay (HOSPITAL_COMMUNITY): Payer: Medicare Other

## 2019-08-05 DIAGNOSIS — D638 Anemia in other chronic diseases classified elsewhere: Secondary | ICD-10-CM

## 2019-08-05 LAB — COMPREHENSIVE METABOLIC PANEL
ALT: 37 U/L (ref 0–44)
AST: 22 U/L (ref 15–41)
Albumin: 2.2 g/dL — ABNORMAL LOW (ref 3.5–5.0)
Alkaline Phosphatase: 52 U/L (ref 38–126)
Anion gap: 8 (ref 5–15)
BUN: 12 mg/dL (ref 8–23)
CO2: 24 mmol/L (ref 22–32)
Calcium: 8.8 mg/dL — ABNORMAL LOW (ref 8.9–10.3)
Chloride: 105 mmol/L (ref 98–111)
Creatinine, Ser: 0.86 mg/dL (ref 0.44–1.00)
GFR calc Af Amer: >60 mL/min (ref >60)
GFR calc non Af Amer: >60 mL/min (ref >60)
Glucose, Bld: 105 mg/dL — ABNORMAL HIGH (ref 70–99)
Potassium: 3.3 mmol/L — ABNORMAL LOW (ref 3.5–5.1)
Sodium: 137 mmol/L (ref 135–145)
Total Bilirubin: 0.7 mg/dL (ref 0.3–1.2)
Total Protein: 4.9 g/dL — ABNORMAL LOW (ref 6.5–8.1)

## 2019-08-05 LAB — CBC WITH DIFFERENTIAL/PLATELET
Abs Immature Granulocytes: 0.55 10*3/uL — ABNORMAL HIGH (ref 0.00–0.07)
Basophils Absolute: 0 10*3/uL (ref 0.0–0.1)
Basophils Relative: 0 %
Eosinophils Absolute: 0.5 10*3/uL (ref 0.0–0.5)
Eosinophils Relative: 4 %
HCT: 24.3 % — ABNORMAL LOW (ref 36.0–46.0)
Hemoglobin: 7.9 g/dL — ABNORMAL LOW (ref 12.0–15.0)
Immature Granulocytes: 4 %
Lymphocytes Relative: 29 %
Lymphs Abs: 3.6 10*3/uL (ref 0.7–4.0)
MCH: 31.5 pg (ref 26.0–34.0)
MCHC: 32.5 g/dL (ref 30.0–36.0)
MCV: 96.8 fL (ref 80.0–100.0)
Monocytes Absolute: 1.3 10*3/uL — ABNORMAL HIGH (ref 0.1–1.0)
Monocytes Relative: 10 %
Neutro Abs: 6.5 10*3/uL (ref 1.7–7.7)
Neutrophils Relative %: 53 %
Platelets: 238 10*3/uL (ref 150–400)
RBC: 2.51 MIL/uL — ABNORMAL LOW (ref 3.87–5.11)
RDW: 21.2 % — ABNORMAL HIGH (ref 11.5–15.5)
WBC: 12.5 10*3/uL — ABNORMAL HIGH (ref 4.0–10.5)
nRBC: 0.4 % — ABNORMAL HIGH (ref 0.0–0.2)

## 2019-08-05 MED ORDER — POTASSIUM CHLORIDE CRYS ER 20 MEQ PO TBCR
20.0000 meq | EXTENDED_RELEASE_TABLET | Freq: Every day | ORAL | Status: DC
  Administered 2019-08-05 – 2019-08-10 (×6): 20 meq via ORAL
  Filled 2019-08-05 (×6): qty 1

## 2019-08-05 NOTE — IPOC Note (Signed)
Overall Plan of Care [facility]) Patient Details Name: Rhonda Hale MRN: [id number] DOB: Oct 15, 1946  Admitting Diagnosis: Toxic metabolic encephalopathy  Hospital Problems: Principal Problem:   Toxic metabolic encephalopathy Active Problems:   AMS (altered mental status)   Anticoagulated   Postoperative hypothyroidism   Ovary cancer ([place])     Functional Problem List: Nursing Endurance, Medication Management, Safety  PT Balance, Endurance, Motor, Pain, Safety, Skin Integrity  OT Safety, Balance, Cognition, Endurance, Motor, Pain, Perception, Vision  SLP Cognition, Linguistic  TR         Basic ADL's: OT Grooming, Bathing, Dressing, Toileting     Advanced  ADL's: OT       Transfers: PT Bed Mobility, Bed to Chair, Car, Manufacturing systems engineer, Metallurgist: PT Ambulation, Stairs     Additional Impairments: OT None  SLP Communication, Social Cognition expression, comprehension Problem Solving, Memory, Attention, Awareness  TR      Anticipated Outcomes Item Anticipated Outcome  Self Feeding S  Swallowing      Basic self-care  S  Toileting  S   Bathroom Transfers S  Bowel/Bladder     Transfers  supervision overall  Locomotion  supervision household gait distances; min assist stairs  Communication  Supervision  Cognition  Min A  Pain     Safety/Judgment  maintain safety with cues/reminders   Therapy Plan: PT Intensity: Minimum of 1-2 x/day ,45 to 90 minutes PT Frequency: 5 out of 7 days(will need spaced out) PT Duration Estimated Length of Stay: 7-10 days OT Intensity: Minimum of 1-2 x/day, 45 to 90 minutes OT Frequency: 5 out of 7 days OT Duration/Estimated Length of Stay: 7-10 SLP Intensity: Minumum of 1-2 x/day, 30 to 90 minutes SLP Frequency: 3 to 5 out of 7 days SLP Duration/Estimated Length of Stay: 7-10 days   Due to the current state of emergency, patients may not be receiving their 3-hours of Medicare-mandated therapy.   Team  Interventions: Nursing Interventions Patient/Family Education, Medication Management, Disease Management/Prevention, Discharge Planning  PT interventions Ambulation/gait training, Training and development officer, Cognitive remediation/compensation, Community reintegration, Disease management/prevention, Discharge planning, DME/adaptive equipment instruction, Functional mobility training, Neuromuscular re-education, Pain management, Patient/family education, Psychosocial support, Skin care/wound management, Stair training, Therapeutic Activities, Therapeutic Exercise, UE/LE Strength taining/ROM, UE/LE Coordination activities, Wheelchair propulsion/positioning  OT Interventions Balance/vestibular training, Discharge planning, Self Care/advanced ADL retraining, Pain management, Therapeutic Activities, UE/LE Coordination activities, Cognitive remediation/compensation, Disease mangement/prevention, Functional mobility training, Patient/family education, Skin care/wound managment, Therapeutic Exercise, Visual/perceptual remediation/compensation, Academic librarian, Engineer, drilling, Neuromuscular re-education, Psychosocial support, Splinting/orthotics, UE/LE Strength taining/ROM, Wheelchair propulsion/positioning  SLP Interventions Cognitive remediation/compensation, Internal/external aids, Speech/Language facilitation, Therapeutic Activities, Environmental controls, Cueing hierarchy, Functional tasks, Patient/family education  TR Interventions    SW/CM Interventions Discharge Planning, Psychosocial Support, Patient/Family Education   Barriers to Discharge MD  Medical stability  Nursing      PT Pending chemo/radiation    OT Incontinence, Pending chemo/radiation    SLP      SW       Team Discharge Planning: Destination: PT-Home ,OT- Home , SLP-Home Projected Follow-up: PT-24 hour supervision/assistance, Home health PT, OT-  Home health OT, SLP-Home Health SLP, 24 hour  supervision/assistance, Outpatient SLP Projected Equipment Needs: PT-(w/c for community), OT- None recommended by OT, SLP-None recommended by SLP Equipment Details: PT- , OT-  Patient/family involved in discharge planning: PT- Patient, Family member/caregiver,  OT-Family member/caregiver, SLP-Patient, Family member/caregiver  MD ELOS: 7-10 days Medical Rehab Prognosis:  Excellent Assessment: The patient has  been admitted for CIR therapies with the diagnosis of toxic encephalopathy. The team will be addressing functional mobility, strength, stamina, balance, safety, adaptive techniques and equipment, self-care, bowel and bladder mgt, patient and caregiver education, cognition, community reentry. Goals set at  supervision for self-care and mobility, min assist for cogntion.    Due to the current state of emergency, patients may not be receiving their 3 hours per day of Medicare-mandated therapy.    Meredith Staggers, MD, FAAPMR      See Team Conference Notes for weekly updates to the plan of care

## 2019-08-05 NOTE — Progress Notes (Addendum)
Occupational Therapy Session Note  Patient Details  Name: Rhonda Hale MRN: [id number] Date of Birth: [birth year]  Today's Date: 08/05/2019 OT Individual Time: 7895-0115 OT Individual Time Calculation (min): 45 min  30 min missed d/t pt fatigue   Short Term Goals: Week 1:  OT Short Term Goal 1 (Week 1): n/a d/t elos  Skilled Therapeutic Interventions/Progress Updates:    Pt received supine with c/o neck soreness, requesting tylenol from nurse. Pt reporting hunger and requesting milk and graham crackers- both obtained for pt. Pt completed bed mobility with CGA to EOB. She completed stand pivot transfer with no cueing, CGA, good hand placement. Pt finished snack sitting in w/c before transitioning to brushing teeth at the sink with cueing for initiation. Pt declined formal bathing 2/2 being cold. Pt donned shirt with (S). Pt declined using RW to complete ambulatory transfer into bathroom. Pt relying heavily on walls/furniture to stabilize herself while ambulating, edu provided re need for AE if pt needs external stabilization. Pt completed toileting tasks with min A for thoroughness. Pt with heavy amounts of fatigue, requiring seated rest break during peri care. Pt changed pants with min A overall. Pt completed transfer back to EOB. Stating she could not participate any further. Pt returned to supine and was left with all needs met, bed alarm set. 30 min skilled OT missed.   Supine end of session: BP: 120/82, HR 90, SpO2 99%.   Therapy Documentation Precautions:  Precautions Precautions: Fall Precaution Comments: has been having trunk extension "tone" with standing previously Restrictions Weight Bearing Restrictions: No Other Position/Activity Restrictions: pts daughter present 24/7 and is a nurse   Therapy/Group: Individual Therapy  Curtis Sites 08/05/2019, 9:54 AM

## 2019-08-05 NOTE — Progress Notes (Signed)
Physical Therapy Session Note  Patient Details  Name: Rhonda Hale MRN: [id number] Date of Birth: 09-13-1946  Today's Date: 08/05/2019 PT Individual Time: [id number] PT Individual Time Calculation (min): 56 min and 32 min  Short Term Goals: Week 1:  PT Short Term Goal 1 (Week 1): = LTGs due to ELOS  Skilled Therapeutic Interventions/Progress Updates:  Treatment 1: Pt received in bed with daughter present in room. Pt c/o unrated L side neck pain that daughter reports is not new but she believes is musculoskeletal. Pain meds requested & RN administered them at beginning of session. Asked daughter to measure height of van seat to allow pt to practice in simulator car prior to d/c. Pt demonstrates confusion & impaired long term memory (unable to recall where she lived prior to [place] unless given extra time). Pt requires encouragement from daughter & therapist to participate & transfers to sitting EOB with bed rails, HOB elevated & supervision. Pt ambulates bed>w/c with RW & supervision. Transported pt to gym via w/c dependent assist for time management. Pt ambulates 65 ft + 65 ft with RW & close supervision with prolonged seated rest break in between 2/2 fatigue.  Continued to review safe hand placement during sit<>stand transfers throughout session, as well as need to ambulate within base of RW vs pushing it out in front of her. Pt stood on compliant foam at high/low table with min/mod assist when not supported with 1UE with task focusing on standing balance as well as cognitive remediation as pt engaged in pipe tree assembly from pre selected pieces for simple shapes with min increasing to total cuing for task. Pt only able to stand for ~1 minute to complete first shape & had to sit to complete 2nd shape 2/2 fatigue. Pt propels w/c ~30 ft with BUE & supervision. Pt returned to room & set up at sink where she performed hand hygiene with cuing to turn on faucet & obtain soap. At end of  session pt left in w/c with chair alarm donned & daughter present to supervise, call bell in reach.   Treatment 2: Pt received in bed with daughter present for session. Pt reports B shoulder fatigue & pain with therapist providing cuing for relaxing B shoulders during gait & rest breaks upon pt request. Pt transfers to sitting EOB with supervision, bed rails, & HOB elevated. Pt transfers sit<>stand with CGA & ambulates to far side of nurses station & back to room with RW & CGA. Pt engaged in ball toss with normal and narrow BOS with min assist with task focusing on standing balance. Pt performs 5x sit<>stand x 2 sets from EOB without BUE support & min assist with task focusing on BLE strengthening. Pt ambulates in room/bathroom with RW & CGA and has continent void on toilet. Pt performs 3/3 toileting tasks without assistance from therapist. Pt performs hand hygiene standing at sink with CGA and cuing to turn off faucet. Educated pt on ELOS and HHPT f/u. Pt left in bed with alarm set, all needs in reach, & daughter present in room.   Therapy Documentation Precautions:  Precautions Precautions: Fall Precaution Comments: has been having trunk extension "tone" with standing previously Restrictions Weight Bearing Restrictions: No Other Position/Activity Restrictions: pts daughter present 24/7 and is a nurse    Therapy/Group: Individual Therapy  Waunita Schooner 08/05/2019, 3:46 PM

## 2019-08-05 NOTE — Progress Notes (Signed)
Social Work Assessment and Plan   Patient Details  Name: Rhonda Hale MRN: [id number] Date of Birth: 1946/06/26  Today's Date: 08/05/2019  Problem List:  Patient Active Problem List   Diagnosis Date Noted  . Toxic metabolic encephalopathy 0000000  . Hyponatremia 07/31/2019  . AMS (altered mental status) 07/27/2019  . Dehydration 07/26/2019  . PRES (posterior reversible encephalopathy syndrome) 07/07/2019  . AKI (acute kidney injury) ([place]) 07/03/2019  . Hypercalcemia 07/03/2019  . Hypomagnesemia, chronic 07/03/2019  . Anticoagulated 04/15/2019  . Ovary cancer ([place]) 07/03/2017  . Postoperative hypothyroidism 01/01/2013   Past Medical History:  Past Medical History:  Diagnosis Date  . Cancer ([place])   . COPD (chronic obstructive pulmonary disease) ([place])   . Hypertension    Past Surgical History:  Past Surgical History:  Procedure Laterality Date  . ABDOMINAL HYSTERECTOMY N/A    Social History:  reports that she has never smoked. She has never used smokeless tobacco. She reports previous alcohol use. She reports that she does not use drugs.  Family / Support Systems Marital Status: Divorced How Long?: 2008 Patient Roles: Parent(grandparent) Children: daughter, Rhonda Hale (pt living with her) @ [phone];  daughter, Rhonda Hale Current in [place], [place] Other Supports: daughter's spouse and children also in the home and can support. Anticipated Caregiver: daughter and daughter's spouse Ability/Limitations of Caregiver: Min A  Caregiver Availability: 24/7 Family Dynamics: Pt and daughters "are very close" (per pt) and Daleen Snook reports that she has wanted pt to come to live with her for some time so she can assist as needed.  Daughter very encouraging and supportive to pt during interview.  They laugh easily together and pt reports that she feels very supported in daughter's home.  Social History Preferred language: English Religion: None Cultural Background: NA Read:  Yes Write: Yes Employment Status: Retired Public relations account executive Issues: [employer] Guardian/Conservator: None - per MD, pt "not yet capable" of making decisions on her own behalf - defer to daughter.   Abuse/Neglect Abuse/Neglect Assessment Can Be Completed: Yes Physical Abuse: Denies Verbal Abuse: Denies Sexual Abuse: Denies Exploitation of patient/patient's resources: Denies Self-Neglect: Denies  Emotional Status Pt's affect, behavior and adjustment status: Pt sitting up in bed with daughter at bedside.  Appears fatigued, however, able to complete assessment interview without much difficulty.  Memory appears impaired at times and daughter provides assistance as needed.  Pt describes herself as very independent and is frustrated with her decline.  She is hopeful her CIR stay will be very short and presents with some limited appreciation of her current deficits overall. Will monitor mood and may benefit from neuropsychology consult while here. Recent Psychosocial Issues: recent hospital stay due to PRES Psychiatric History: None Substance Abuse History: None  Patient / Family Perceptions, Expectations & Goals Pt/Family understanding of illness & functional limitations: Pt states, "I don't really know what the problem is.  I just started needing more help."  Daughter, who is a Therapist, nutritional, has a good understanding that MDs feel likely pt with metabolic encephalopathy. Premorbid pt/family roles/activities: Pt was living alone in [place] until a couple of months ago when she had a fall and daughter brought her to her home. Anticipated changes in roles/activities/participation: Little change anticipated as daughter and her family were providing support PTA. Pt/family expectations/goals: Pt focused simply on "going home"  and daughter hopeful she can reach supervision goals.  Community [employer] Agencies: None Premorbid Home Care/DME Agencies: Other (Comment)([facility]) Transportation available at discharge: yes Resource referrals  recommended: Neuropsychology  Discharge Planning Living Arrangements: Children Support Systems: Children, Other relatives Type of Residence: Private residence Insurance Resources: [employer] Financial Resources: Radio broadcast assistant Screen Referred: No Living Expenses: Lives with family Money Management: Family Does the patient have any problems obtaining your medications?: No Home Management: mostly daughter and her family currently Patient/Family Preliminary Plans: Pt to return to daughter's home where 24/7 support is available. Social Work Anticipated Follow Up Needs: HH/OP Expected length of stay: 7-10 days  Clinical Impression Very pleasant woman here following steady decline with h/o recent fall and hospital admission.  Has moved in with daughter and began another function/ cognitive decline at that home which MDs have felt to be encephalopathic in nature.  Making good progress and pt eager to d/c home ASAP.  Daughter very committed to providing 24/7 support.  Will follow for support and d/c planning needs.  Rhonda Hale 08/05/2019, 2:00 PM

## 2019-08-05 NOTE — Plan of Care (Signed)
  Problem: Consults Goal: RH GENERAL PATIENT EDUCATION Description: See Patient Education module for education specifics. Outcome: Progressing   Problem: RH SAFETY Goal: RH STG ADHERE TO SAFETY PRECAUTIONS W/ASSISTANCE/DEVICE Description: STG Adhere to Safety Precautions With cues/reminders Assistance/Device. Outcome: Progressing   Problem: RH KNOWLEDGE DEFICIT GENERAL Goal: RH STG INCREASE KNOWLEDGE OF SELF CARE AFTER HOSPITALIZATION Description: Patient will be able to direct care for discharge using educational information and handouts with cues/reminders Outcome: Progressing

## 2019-08-05 NOTE — Progress Notes (Signed)
Speech Language Pathology Daily Session Note  Patient Details  Name: Rhonda Hale MRN: [id number] Date of Birth: 22-Sep-1946  Today's Date: 08/05/2019 SLP Individual Time: 1300-1400 SLP Individual Time Calculation (min): 60 min  Short Term Goals: Week 1: SLP Short Term Goal 1 (Week 1): STGs=LTGs due to short length of stay  Skilled Therapeutic Interventions: Skilled ST services focused on cognitive skills. SLP facilitated basic problem solving and emergent awareness utilizing basic money management (counting/displaying coins/cash), pt required initial max A verbal cues, however when broken down step by step by required mod A verbal cues and with written aid present to reduce need for working memory during addition of two amounts. Pt initially deined difficulty and errors, however as the tasks continued expressed increase awareness of errors. SLP also facilitated basic problem solving and emergent awareness during card sorting task by color/shape (in a field of 6) with mod I for color and supervision for shape, as well as in novel card task played at simplest level, pt required mod A fade to min A verbal cues and min A for use of written aid containing three rules. Pt was left in room with call bell within reach, daughter in room and chair alarm set. ST recommends to continue skilled ST services.      Pain Pain Assessment Pain Scale: 0-10 Pain Score: 0-No pain  Therapy/Group: Individual Therapy  Michel Eskelson  [facility] 08/05/2019, 3:22 PM

## 2019-08-05 NOTE — Progress Notes (Signed)
[place] PHYSICAL MEDICINE & REHABILITATION PROGRESS NOTE   Subjective/Complaints: Improving cognition but still with STM deficits. C/o left sided neck pain today  ROS: Patient denies fever, rash, sore throat, blurred vision, nausea, vomiting, diarrhea, cough, shortness of breath or chest pain, joint or back pain, headache, or mood change.    Objective:   No results found. Recent Labs    08/05/19 0421  WBC 12.5*  HGB 7.9*  HCT 24.3*  PLT 238   Recent Labs    08/03/19 1123 08/05/19 0421  NA 137 137  K 3.9 3.3*  CL 104 105  CO2 22 24  GLUCOSE 161* 105*  BUN 11 12  CREATININE 0.86 0.86  CALCIUM 8.4* 8.8*    Intake/Output Summary (Last 24 hours) at 08/05/2019 1135 Last data filed at 08/05/2019 0800 Gross per 24 hour  Intake 520 ml  Output -  Net 520 ml     Physical Exam: Vital Signs Blood pressure 130/69, pulse 61, temperature 98.8 F (37.1 C), resp. rate 19, height 5\' 2"  (1.575 m), weight 72.5 kg, SpO2 97 %.  Constitutional: No distress . Vital signs reviewed. HEENT: EOMI, oral membranes moist Neck: supple Cardiovascular: RRR without murmur. No JVD    Respiratory: CTA Bilaterally without wheezes or rales. Normal effort    GI: BS +, non-tender, non-distended  Musculoskeletal:Normal range of motion. Left upper trap,[place] tight and TTP General: No edema.  Neurological: She isalert. Fair insight and awareness. Tangential. Recalled me from Friday.  Otherwise fairly appropriate. UE 4/5. LE 4/5 prox to distal. No sensory findings Skin: Skin iswarmand dry. She isnot diaphoretic.  Psychiatric: She has a normal mood and affect. Herbehavior is normal.   Assessment/Plan: 1. Functional deficits secondary to encephalopathy which require 3+ hours per day of interdisciplinary therapy in a comprehensive inpatient rehab setting.  Physiatrist is providing close team supervision and 24 hour management of active medical problems listed below.  Physiatrist and  rehab team continue to assess barriers to discharge/monitor patient progress toward functional and medical goals  Care Tool:  Bathing  Bathing activity did not occur: Refused Body parts bathed by patient: Buttocks, Front perineal area         Bathing assist Assist Level: Minimal Assistance - Patient > 75%     Upper Body Dressing/Undressing Upper body dressing   What is the patient wearing?: Pull over shirt    Upper body assist Assist Level: Supervision/Verbal cueing    Lower Body Dressing/Undressing Lower body dressing      What is the patient wearing?: Pants, Incontinence brief     Lower body assist Assist for lower body dressing: Minimal Assistance - Patient > 75%     Toileting Toileting    Toileting assist Assist for toileting: Minimal Assistance - Patient > 75%     Transfers Chair/bed transfer  Transfers assist     Chair/bed transfer assist level: Minimal Assistance - Patient > 75%     Locomotion Ambulation   Ambulation assist      Assist level: Minimal Assistance - Patient > 75% Assistive device: Hand held assist Max distance: 15'   Walk 10 feet activity   Assist     Assist level: Minimal Assistance - Patient > 75% Assistive device: Hand held assist   Walk 50 feet activity   Assist Walk 50 feet with 2 turns activity did not occur: Safety/medical concerns         Walk 150 feet activity   Assist Walk 150 feet activity did not  occur: Safety/medical concerns         Walk 10 feet on uneven surface  activity   Assist Walk 10 feet on uneven surfaces activity did not occur: Safety/medical concerns         Wheelchair     Assist Will patient use wheelchair at discharge?: No(TBD - mainly for transport)      Wheelchair assist level: Dependent - Patient 0%      Wheelchair 50 feet with 2 turns activity    Assist        Assist Level: Dependent - Patient 0%   Wheelchair 150 feet activity     Assist           Blood pressure 130/69, pulse 61, temperature 98.8 F (37.1 C), resp. rate 19, height 5\' 2"  (1.575 m), weight 72.5 kg, SpO2 97 %.  Medical Problem List and Plan: 1.Decreased functional mobilitysecondary to acute toxic metabolic encephalopathy -Continue CIR therapies including PT, OT  2. Antithrombotics: -DVT/anticoagulation:Eliquis for history of pulmonary emboli -antiplatelet therapy: N/A 3. Pain Management:Tylenol as needed  -add kpad for neck 4. Mood:Seroquel 25 mg nightly -antipsychotic agents: N/A 5. Neuropsych: This patientis notyetcapable of making decisions on herown behalf. -improved hs confusion this weekend? 6. Skin/Wound Care:Routine skin checks 7. Fluids/Electrolytes/Nutrition:encourage PO  -I personally reviewed the patient's labs today.   -replete K+ 3.3  8. History of metastatic ovarian cancer with chemotherapy. Follow-up oncology services 9. Acute on chronic renal insufficiency. Baseline creatinine 1.1-1.3 -encourage fluids -Cr. 0.86. 10. Bone mineral metabolism. Followed by endocrinology in [place] 11. Hypertension. Norvasc 10 mg daily, labetalol 50 mg twice daily.   -bp controlled at present 12. Hypothyroidism. Synthroid  13. Anemia: likely ACD although hgb has been trending back down over last week   -recheck cbc Wednesday  -heme check stool  LOS: 3 days A FACE TO [facility] 08/05/2019, 11:35 AM

## 2019-08-06 ENCOUNTER — Inpatient Hospital Stay (HOSPITAL_COMMUNITY): Payer: Medicare Other

## 2019-08-06 ENCOUNTER — Inpatient Hospital Stay (HOSPITAL_COMMUNITY): Payer: Medicare Other | Admitting: Rehabilitation

## 2019-08-06 ENCOUNTER — Inpatient Hospital Stay (HOSPITAL_COMMUNITY): Payer: Medicare Other | Admitting: Speech Pathology

## 2019-08-06 MED ORDER — ESCITALOPRAM OXALATE 10 MG PO TABS
20.0000 mg | ORAL_TABLET | Freq: Every day | ORAL | Status: DC
  Administered 2019-08-06 – 2019-08-10 (×5): 20 mg via ORAL
  Filled 2019-08-06 (×5): qty 2

## 2019-08-06 MED ORDER — MUSCLE RUB 10-15 % EX CREA
TOPICAL_CREAM | Freq: Two times a day (BID) | CUTANEOUS | Status: DC
  Administered 2019-08-06 (×2): via TOPICAL
  Administered 2019-08-07: 1 via TOPICAL
  Administered 2019-08-07 – 2019-08-10 (×6): via TOPICAL
  Filled 2019-08-06: qty 85

## 2019-08-06 NOTE — Progress Notes (Signed)
Occupational Therapy Session Note  Patient Details  Name: Rhonda Hale MRN: [id number] Date of Birth: [birth year]  Today's Date: 08/06/2019 OT Individual Time: 1660-6004 OT Individual Time Calculation (min): 43 min  and Today's Date: 08/06/2019 OT Missed Time: 17 Minutes Missed Time Reason: Patient fatigue   Short Term Goals: Week 1:  OT Short Term Goal 1 (Week 1): n/a d/t elos  Skilled Therapeutic Interventions/Progress Updates:    Pt received supine with no c/o pain, but c/o fatigue and stating "I thought we didn't have to do anything today". Pt completed bed mobility to EOB with CGA. CGA provided during stand pivot transfer to w/c. Pt donned shirt with (S), CGA to don pants. Pt declined bathing, no reason stated. Pt used RW to complete ambulatory transfer into bathroom with CGA. Pt completed toileting tasks, requiring min A to pull pants up in standing (+BM void). Pt required extended rest break following toileting. Pt agreeable to "one last thing before I need to lay down". Pt completed 2 trials of 35 ft of functional mobility with RW use, CGA-(S) overall. Pt required extended rest break between trials. Pt returned supine and was left with all needs met, NT present.   Therapy Documentation Precautions:  Precautions Precautions: Fall Precaution Comments: has been having trunk extension "tone" with standing previously Restrictions Weight Bearing Restrictions: No Other Position/Activity Restrictions: pts daughter present 24/7 and is a nurse   Therapy/Group: Individual Therapy  Curtis Sites 08/06/2019, 7:12 AM

## 2019-08-06 NOTE — Progress Notes (Signed)
[place] PHYSICAL MEDICINE & REHABILITATION PROGRESS NOTE   Subjective/Complaints: Still having a lot of neck pain. Didn't get kpad as none available yet. Daughter asked if she could go back on her antidepressant---found that this was lexapro.  ROS: Patient denies fever, rash, sore throat, blurred vision, nausea, vomiting, diarrhea, cough, shortness of breath or chest pain,     Objective:   No results found. Recent Labs    08/05/19 0421  WBC 12.5*  HGB 7.9*  HCT 24.3*  PLT 238   Recent Labs    08/03/19 1123 08/05/19 0421  NA 137 137  K 3.9 3.3*  CL 104 105  CO2 22 24  GLUCOSE 161* 105*  BUN 11 12  CREATININE 0.86 0.86  CALCIUM 8.4* 8.8*    Intake/Output Summary (Last 24 hours) at 08/06/2019 0946 Last data filed at 08/06/2019 0838 Gross per 24 hour  Intake 490 ml  Output -  Net 490 ml     Physical Exam: Vital Signs Blood pressure (!) 148/68, pulse 85, temperature 98.2 F (36.8 C), temperature source Oral, resp. rate 15, height 5\' 2"  (1.575 m), weight 72.5 kg, SpO2 97 %.  Constitutional: No distress . Vital signs reviewed. HEENT: EOMI, oral membranes moist Neck: supple Cardiovascular: RRR without murmur. No JVD    Respiratory: CTA Bilaterally without wheezes or rales. Normal effort    GI: BS +, non-tender, non-distended  Musculoskeletal:Normal range of motion. Left upper trap,[place] tight and TTP General: No edema.  Neurological: She isalert. Fair insight and awareness. More focused. STM deficits.  Otherwise fairly appropriate. UE 4/5. LE 4/5 prox to distal. No sensory findings Skin: Skin iswarmand dry. She isnot diaphoretic.  Psychiatric: She has a normal mood and affect. Herbehavior is normal.   Assessment/Plan: 1. Functional deficits secondary to encephalopathy which require 3+ hours per day of interdisciplinary therapy in a comprehensive inpatient rehab setting.  Physiatrist is providing close team supervision and 24 hour management of  active medical problems listed below.  Physiatrist and rehab team continue to assess barriers to discharge/monitor patient progress toward functional and medical goals  Care Tool:  Bathing  Bathing activity did not occur: Refused Body parts bathed by patient: Buttocks, Front perineal area         Bathing assist Assist Level: Minimal Assistance - Patient > 75%     Upper Body Dressing/Undressing Upper body dressing   What is the patient wearing?: Pull over shirt    Upper body assist Assist Level: Supervision/Verbal cueing    Lower Body Dressing/Undressing Lower body dressing      What is the patient wearing?: Pants, Incontinence brief     Lower body assist Assist for lower body dressing: Minimal Assistance - Patient > 75%     Toileting Toileting    Toileting assist Assist for toileting: Minimal Assistance - Patient > 75%     Transfers Chair/bed transfer  Transfers assist     Chair/bed transfer assist level: Supervision/Verbal cueing     Locomotion Ambulation   Ambulation assist      Assist level: Contact Guard/Touching assist Assistive device: Walker-rolling Max distance: 200 ft   Walk 10 feet activity   Assist     Assist level: Contact Guard/Touching assist Assistive device: Walker-rolling   Walk 50 feet activity   Assist Walk 50 feet with 2 turns activity did not occur: Safety/medical concerns  Assist level: Contact Guard/Touching assist Assistive device: Walker-rolling    Walk 150 feet activity   Assist Walk 150 feet activity  did not occur: Safety/medical concerns  Assist level: Contact Guard/Touching assist Assistive device: Walker-rolling    Walk 10 feet on uneven surface  activity   Assist Walk 10 feet on uneven surfaces activity did not occur: Safety/medical concerns         Wheelchair     Assist Will patient use wheelchair at discharge?: No(TBD - mainly for transport)      Wheelchair assist level:  Supervision/Verbal cueing Max wheelchair distance: 30 ft    Wheelchair 50 feet with 2 turns activity    Assist        Assist Level: Dependent - Patient 0%   Wheelchair 150 feet activity     Assist          Blood pressure (!) 148/68, pulse 85, temperature 98.2 F (36.8 C), temperature source Oral, resp. rate 15, height 5\' 2"  (1.575 m), weight 72.5 kg, SpO2 97 %.  Medical Problem List and Plan: 1.Decreased functional mobilitysecondary to acute toxic metabolic encephalopathy -Continue CIR therapies including PT, OT  2. Antithrombotics: -DVT/anticoagulation:Eliquis for history of pulmonary emboli -antiplatelet therapy: N/A 3. Pain Management:Tylenol as needed  -await kpad for neck  -muscle rub PRN   -reviewed posture, etc 4. Mood:Seroquel 25 mg nightly -antipsychotic agents: N/A 5. Neuropsych: This patientis notyetcapable of making decisions on herown behalf. -improved hs confusion this weekend? 6. Skin/Wound Care:Routine skin checks 7. Fluids/Electrolytes/Nutrition:encourage PO  - replete K+ 3.3  -recheck Thursday  8. History of metastatic ovarian cancer with chemotherapy. Follow-up oncology services 9. Acute on chronic renal insufficiency. Baseline creatinine 1.1-1.3 -encourage fluids -Cr. 0.86. 10. Bone mineral metabolism. Followed by endocrinology in [place] 11. Hypertension. Norvasc 10 mg daily, labetalol 50 mg twice daily.   -bp under reasonable control 12. Hypothyroidism. Synthroid  13. Anemia: likely ACD although hgb has been trending back down over last week   -recheck cbc Thursday  -heme check stool  LOS: 4 days A FACE TO FACE EVALUATION WAS PERFORMED  Meredith Staggers 08/06/2019, 9:46 AM

## 2019-08-06 NOTE — Progress Notes (Signed)
Physical Therapy Session Note  Patient Details  Name: Rhonda Hale MRN: [id number] Date of Birth: [birth year]  Today's Date: 08/06/2019 PT Individual Time: 1130-1155 PT Individual Time Calculation (min): 25 min   Short Term Goals: Week 1:  PT Short Term Goal 1 (Week 1): = LTGs due to ELOS  Skilled Therapeutic Interventions/Progress Updates:   Pt received lying in bed, agreeable to therapy session. Performed bed mobility at S level with min use of bedrails.  Sit<>stand at S level to RW during session x several reps.  Ambulated to day room x 75' with RW at [facility] with min cues for maintaining closeness to RW.  Once in day room, performed seated nustep x 8 mins at level 3 with BUEs/LEs for overall strengthening and endurance.  Pt tolerated well without rest breaks, maintaining reps per min in high 40's (did provide cues to increase to 50's, however pt unable to maintain).  Performed 10 sit<>stand from standard arm chair without use of UEs with cues for slower descent and to scoot to EOC prior to standing.  Ambulated back to room (increased distance due to going out opposite door) x 200' with RW at [facility] as above with min cues to guide RW away from obstacles.  Pt did engage in appropriate conversation during session, giving PT information about her daughter (her work history), grandchildren and that she likes to spend time with them.  Pt very eager to return home.  Pt returned to bed when back in room despite being encouraged to remain seated for lunch.  All needs in reach and bed alarm set.  Discussed pm session with pt and would ask daughter (if present) any concerns for mobility upon going home soon in order to practice.  Pt verbalized understanding.   Therapy Documentation Precautions:  Precautions Precautions: Fall Precaution Comments: has been having trunk extension "tone" with standing previously Restrictions Weight Bearing Restrictions: No Other Position/Activity Restrictions: pts daughter present  24/7 and is a nurse Pain: Pt continues to report some mild soreness in neck, but does "its actually better today."  Did not rate.     Therapy/Group: Individual Therapy  Denice Bors 08/06/2019, 11:39 AM

## 2019-08-06 NOTE — Progress Notes (Signed)
Physical Therapy Session Note  Patient Details  Name: Rhonda Hale MRN: [id number] Date of Birth: 1946/07/16  Today's Date: 08/06/2019 PT Individual Time: 1405-1450 PT Individual Time Calculation (min): 45 min   Short Term Goals: Week 1:  PT Short Term Goal 1 (Week 1): = LTGs due to ELOS  Skilled Therapeutic Interventions/Progress Updates:   Pt received lying in bed, agreeable to therapy session.  Performed bed mobility with use of bed rail at S level.  Performed all sit<>stand transfers to RW at S level, improved hand placement during this session.  Ambulated to main therapy gym x 100' with RW at S level with min cues for closeness to RW.  Once in therapy gym, performed dynamic gait/balance with kicking bean bag x 100' alternating LEs.  Pts daughter present in hallway during session, therefore pt took seated rest break by elevators to speak with daughter.  PT asked whether there were any concerns with pt going home from a mobility standpoint.  Daughter did not believe there was, so PT asked about floor transfer.  Pt reports she feels she can get up from floor and was willing to do transfer in room.  Pt ambulated back to room x 70' with RW at S level and PT retrieved mat for floor transfer.  PT demonstrated how to get into floor safely and back up.  Pt returned demo for getting in to floor at [facility].  Pt able to go from supine>SL>side sit>quadruped>tall kneeling>half kneeling at S level, however did need min A to elevate to partial stand to bed.  Pt elevated and then "crawled" into bed for end of session.  Pt left with daughter with all needs in reach.   Therapy Documentation Precautions:  Precautions Precautions: Fall Precaution Comments: has been having trunk extension "tone" with standing previously Restrictions Weight Bearing Restrictions: No Other Position/Activity Restrictions: pts daughter present 24/7 and is a nurse  Pain: Pain Assessment Pain Scale: 0-10 Pain Score: 0-No  pain    Therapy/Group: Individual Therapy  Denice Bors 08/06/2019, 3:44 PM

## 2019-08-06 NOTE — Progress Notes (Signed)
Speech Language Pathology Daily Session Note  Patient Details  Name: Rhonda Hale MRN: [id number] Date of Birth: 11/09/1946  Today's Date: 08/06/2019 SLP Individual Time: 0825-0920 SLP Individual Time Calculation (min): 55 min  Short Term Goals: Week 1: SLP Short Term Goal 1 (Week 1): STGs=LTGs due to short length of stay  Skilled Therapeutic Interventions: Skilled treatment session focused on cognitive-linguistic goals. Patient was overall Mod I for word-finding at the conversation level and self-monitored and corrected verbal errors independently. However, during structured language tasks, patient required Min verbal cues for generative naming tasks. Patient with was independently oriented to place and date but required Min verbal cues to recall events from previous therapy sessions. Patient left upright in bed with alarm on and all needs within reach. Continue with current plan of care.      Pain 8/10 neck pain RN aware Patient repositioned and RN administered medications  Therapy/Group: Individual Therapy  Keiarah Orlowski 08/06/2019, 10:07 AM

## 2019-08-06 NOTE — Plan of Care (Signed)
  Problem: Consults Goal: RH GENERAL PATIENT EDUCATION Description: See Patient Education module for education specifics. Outcome: Progressing   Problem: RH SAFETY Goal: RH STG ADHERE TO SAFETY PRECAUTIONS W/ASSISTANCE/DEVICE Description: STG Adhere to Safety Precautions With cues/reminders Assistance/Device. Outcome: Progressing   Problem: RH KNOWLEDGE DEFICIT GENERAL Goal: RH STG INCREASE KNOWLEDGE OF SELF CARE AFTER HOSPITALIZATION Description: Patient will be able to direct care for discharge using educational information and handouts with cues/reminders Outcome: Progressing

## 2019-08-07 ENCOUNTER — Inpatient Hospital Stay (HOSPITAL_COMMUNITY): Payer: Medicare Other

## 2019-08-07 ENCOUNTER — Inpatient Hospital Stay (HOSPITAL_COMMUNITY): Payer: Medicare Other | Admitting: Speech Pathology

## 2019-08-07 ENCOUNTER — Ambulatory Visit: Payer: Medicare Other

## 2019-08-07 MED ORDER — ENSURE MAX PROTEIN PO LIQD
11.0000 [oz_av] | Freq: Two times a day (BID) | ORAL | Status: DC
  Administered 2019-08-08 – 2019-08-09 (×4): 11 [oz_av] via ORAL
  Filled 2019-08-07 (×7): qty 330

## 2019-08-07 NOTE — Patient Care Conference (Signed)
Inpatient RehabilitationTeam Conference and Plan of Care Update Date: 08/06/2019   Time: 10:15 AM    Patient Name: Rhonda Hale      Medical Record Number: [id number]  Date of Birth: 1946-06-08 Sex: Female         Room/Bed: 4W20C/4W20C-01 Payor Info: Payor: [employer] / Plan: [facility] MEDICARE / Product Type: *No Product type* /    Admitting Diagnosis: 1. TBI Team  NTBI, Toxic Encephalopathy; 13-15days  Admit Date/Time:  08/02/2019  2:16 PM Admission Comments: No comment available   Primary Diagnosis:  Toxic metabolic encephalopathy Principal Problem: Toxic metabolic encephalopathy  Patient Active Problem List   Diagnosis Date Noted  . Toxic metabolic encephalopathy 0000000  . Hyponatremia 07/31/2019  . AMS (altered mental status) 07/27/2019  . Dehydration 07/26/2019  . PRES (posterior reversible encephalopathy syndrome) 07/07/2019  . AKI (acute kidney injury) ([place]) 07/03/2019  . Hypercalcemia 07/03/2019  . Hypomagnesemia, chronic 07/03/2019  . Anticoagulated 04/15/2019  . Ovary cancer ([place]) 07/03/2017  . Postoperative hypothyroidism 01/01/2013    Expected Discharge Date: Expected Discharge Date: 08/13/19  Team Members Present: Physician leading conference: Dr. Alger Simons Social Worker Present: Lennart Pall, LCSW Nurse Present: Ellison Carwin, LPN PT Present: Canary Brim, PT OT Present: Laverle Hobby, OT SLP Present: Weston Anna, SLP PPS Coordinator present : Gunnar Fusi, SLP     Current Status/Progress Goal Weekly Team Focus  Medical   toxic encephalopathy hx of ovarian cancer. neck pain. hx of pe  improve memory/cognition  pain, nutriton, stamina   Bowel/Bladder   pt is con/incon of b/b  patient will be con of b/b with timed toileting.      Swallow/Nutrition/ Hydration             ADL's   min A ADL transfers, min A LB dressing/bathing, (S) UB bathing/dressing, moderate cueing for cognition, poor activity tolerance  Supervision overall  d/c  planning and family edu, ADL retraining, safety and independence with transfers, cognitive retraining   Mobility   supervision bed mobility, min assist transfers, min assist gait with RW x 30 ft, poor endurance/activity tolerance  supervision overall with LRAD, CGA 4 steps with rail  activity tolerance, bed mobility, gait, transfers, gait, stairs, pt education, d/c planning, balance, endurance, strengthening   Communication   Supervision  Supervision  word-finding, self-monitoring and correcting verbal errors   Safety/Cognition/ Behavioral Observations  Min-Mod A  Supervision-Min A  recall with stragegies, awareness and attention   Pain             Skin   masd to the groin  keeping pt clean and dry and using barrier cream to prevent breakdown and infection.       Rehab Goals Patient on target to meet rehab goals: Yes *See Care Plan and progress notes for long and short-term goals.     Barriers to Discharge  Current Status/Progress Possible Resolutions Date Resolved   Physician    Medical stability        see medical progress notes      Nursing                  PT  Pending chemo/radiation                 OT Incontinence;Pending chemo/radiation                SLP                SW  Discharge Planning/Teaching Needs:  Pt to d/c home with daughter and her family who can provide 24/7 supervision.  Teaching is ongoing as daughter is here daily.   Team Discussion:  Multiple med issues;  Enceph; kidneys stable.  MD has restarted anti-depressant.  Cont b/b.  Poor activity tolerance.  Min assist transfers, b/d with mod cues and supervision goals.  CG/ min assist mobility and supervision goals.  Improved cognition and language as well as increased self-awareness and correction.  Revisions to Treatment Plan:  NA    Continued Need for Acute Rehabilitation Level of Care: The patient requires daily medical management by a physician with specialized training in physical  medicine and rehabilitation for the following conditions: Daily direction of a multidisciplinary physical rehabilitation program to ensure safe treatment while eliciting the highest outcome that is of practical value to the patient.: Yes Daily medical management of patient stability for increased activity during participation in an intensive rehabilitation regime.: Yes Daily analysis of laboratory values and/or radiology reports with any subsequent need for medication adjustment of medical intervention for : Neurological problems;Other   I attest that I was present, lead the team conference, and concur with the assessment and plan of the team.   Lennart Pall 08/07/2019, 1:24 PM    Team conference was held via web/ teleconference due to [place] - 19

## 2019-08-07 NOTE — Care Management (Signed)
Inpatient [facility] Individual Statement of Services  Patient Name:  Aafiya Crout  Date:  08/07/2019  Welcome to the [facility].  Our goal is to provide you with an individualized program based on your diagnosis and situation, designed to meet your specific needs.  With this comprehensive rehabilitation program, you will be expected to participate in at least 3 hours of rehabilitation therapies Monday-Friday, with modified therapy programming on the weekends.  Your rehabilitation program will include the following services:  Physical Therapy (PT), Occupational Therapy (OT), Speech Therapy (ST), 24 hour per day rehabilitation nursing, Therapeutic Recreaction (TR), Neuropsychology, Case Management (Social Worker), Rehabilitation Medicine, Nutrition Services and Pharmacy Services  Weekly team conferences will be held on Tuesdays to discuss your progress.  Your Social Worker will talk with you frequently to get your input and to update you on team discussions.  Team conferences with you and your family in attendance may also be held.  Expected length of stay: 7-10 days   Overall anticipated outcome: supervision  Depending on your progress and recovery, your program may change. Your Social Worker will coordinate services and will keep you informed of any changes. Your Social Worker's name and contact numbers are listed  below.  The following services may also be recommended but are not provided by the [facility] will be made to provide these services after discharge if needed.  Arrangements include referral to agencies that provide these services.  Your insurance has been verified to be:  [facility] Medicare Your primary doctor is:  Baldo Ash)  Pertinent information will be shared with your doctor and your insurance company.  Social  Worker:  [place], [place] or ([phone]   Information discussed with and copy given to patient by: Lennart Pall, 08/07/2019, 1:54 PM

## 2019-08-07 NOTE — Progress Notes (Signed)
Occupational Therapy Session Note  Patient Details  Name: Rhonda Hale MRN: [id number] Date of Birth: Nov 13, 1946  Today's Date: 08/07/2019 OT Individual Time: 1450-1600 OT Individual Time Calculation (min): 70 min    Short Term Goals: Week 1:  OT Short Term Goal 1 (Week 1): n/a d/t elos  Skilled Therapeutic Interventions/Progress Updates:    1;1. Pt received in bathroom with RN present. Pt able to void bowel and bladder on toilet, complete hygiene and clothing amangmetn with S overall using RW. Pt requires mod VC for safe reach back, hand placement during sit to stand and RW management. Pt completes sock/shoe donning with A for tightening laces. Pt unable to problem solve which to pull to tighten. Pt completes 3x30 ball tosses (chest bounce and overhead pass) for BUE coordination endurance required for BADLS. Pt requires prolonged rest breaks d/t fatigue between sets. Pt completes 3x30 beach ball volley with 2# dowel rod seated in w/c for BUE strengthening. Pt ambulates to/from dayroom with S and RW. Pt completes standing trials without RW with mod VC for use of Wii remote during wii bowling. Pt requires seated rest breaks between each frame d/t decreased endurance. Exited session with pt setaed in bed, exit alarm on and call light tin reach  Therapy Documentation Precautions:  Precautions Precautions: Fall Precaution Comments: has been having trunk extension "tone" with standing previously Restrictions Weight Bearing Restrictions: No Other Position/Activity Restrictions: pts daughter present 24/7 and is a nurse General:   Vital Signs: Therapy Vitals Temp: 98.4 F (36.9 C) Temp Source: Oral Pulse Rate: 91 Resp: 20 BP: (!) 114/52 Patient Position (if appropriate): Lying Oxygen Therapy SpO2: 99 % O2 Device: Room Air Pain:   ADL: ADL Grooming: Supervision/safety Where Assessed-Grooming: Sitting at sink Upper Body Bathing: Supervision/safety Where Assessed-Upper Body Bathing:  Shower Lower Body Bathing: Minimal assistance Where Assessed-Lower Body Bathing: Shower Upper Body Dressing: Minimal assistance Where Assessed-Upper Body Dressing: Sitting at sink Lower Body Dressing: Minimal assistance Where Assessed-Lower Body Dressing: Sitting at sink, Standing at sink Toileting: Minimal assistance Where Assessed-Toileting: Glass blower/designer: Psychiatric nurse Method: Arts development officer: Raised toilet seat Social research officer, government: Environmental education officer Method: Radiographer, therapeutic: Civil engineer, contracting with back Glass blower/designer   Exercises:   Other Treatments:     Therapy/Group: Individual Therapy  Tonny Branch 08/07/2019, 3:15 PM

## 2019-08-07 NOTE — Progress Notes (Signed)
Physical Therapy Session Note  Patient Details  Name: Rhonda Hale MRN: [id number] Date of Birth: 01/25/1946  Today's Date: 08/07/2019 PT Individual Time: 0902-1017 PT Individual Time Calculation (min): 75 min   Short Term Goals: Week 1:  PT Short Term Goal 1 (Week 1): = LTGs due to ELOS Week 2:    Week 3:     Skilled Therapeutic Interventions/Progress Updates:      Therapy Documentation Precautions:  Precautions Precautions: Fall Precaution Comments: has been having trunk extension "tone" with standing previously Restrictions Weight Bearing Restrictions: No Other Position/Activity Restrictions: pts daughter present 24/7 and is a nurse AM Session: Pain:  Denies pain Therapeutic activity:  Supine to sit on edge of bed w/use of rail w/supervision.  In siting pt able to doff pants min assist and shirt w/supervision.  Able to donn spandex tights w/mod assist first sitting and then in standing to complete task.  Donned shirt w/supervision in sitting.   Bed to wc w/RW via short distance gait, turn, stand to sit and cga. wc propulsion w/bilat UE/s x 50 ft for warm up activity.   Therapeutic Exercise:  Dynamic balance training including:   Repeated sit to stand from hi lo mat w/supervision for balance x 2 sets, 8 reps Static standing heel raises without AD and cga Standing alternating tapping 5inch step w/cga to occasional min assist for balance repeated 10x , 2 sets  Gait:  Gait w/RW and supervision to cga x 142ft w/mild decreased clearance R foot thru swing, no blance loss. Gait 131ft without AD and cga, occasional tendency towards decreased clearance R foot but without blance loss  Second gait effort 110ft without AD and cga to mod assist for balance due to bilat decreased clearance w/fatigue.  Assessment: Pt balance/dynamic stability significantly declines with fatigue. Pt requires significant length rest breaks between activities.  Discussed concerns re: fall risk due to this and  benefits of continuing inpt rehab into next week to further address strength, endurance, and balance.  Pt understood therapists recommendation.     Therapy/Group: Individual Therapy   Callie Fielding, PT  08/07/2019, 3:44 PM

## 2019-08-07 NOTE — Progress Notes (Signed)
Social Work Patient ID: Rhonda Hale, female   DOB: Dec 10, 1946, 73 y.o.   MRN: [id number]   Met with pt and daughter yesterday to review team conference.  Both aware of targeted d/c date of 9/1 and supervision goals. Daughter agreeable, however, pt states, "I'm not happy with that at all.  What in the world."  Discussed her current assist levels and goals again and daughter stressed that she needs pt at a supervision level for her to manage pt's support and her own family needs at the same time.  Pt does listen to daughter but questions "what are they (therapies) seeing that makes them think I need that long?"    Daughter asking if therapies could give "real time" education to pt in the sessions about what they are seeing from pt's care/ assist and safety needs right now and where they are aiming.  Pt needs concrete examples to help her understand.  I have discussed this with therapy and they will continue to offer this education to pt.  As I promised to pt, I have made all aware that, if she meets her supervision goals earlier than 9/1, then she wants to d/c earlier (wink wink).  Saranne Crislip, LCSW

## 2019-08-07 NOTE — Progress Notes (Signed)
Speech Language Pathology Daily Session Note  Patient Details  Name: Rhonda Hale MRN: [id number] Date of Birth: [birth year]  Today's Date: 08/07/2019 SLP Individual Time: 1030-1115 SLP Individual Time Calculation (min): 45 min  Short Term Goals: Week 1: SLP Short Term Goal 1 (Week 1): STGs=LTGs due to short length of stay  Skilled Therapeutic Interventions: Skilled treatment session focused on cognitive goals. SLP facilitated session by providing supervision level verbal cues for patient to recall her current medications and their functions with 90% accuracy. Overall Min A verbal cues were needed for functional problem solving while organizing a BID pill box and to self-monitor and correct errors. Patient requested to use the commode and ambulated to the bathroom with Min A. Patient was able to successfully void. Patient left upright in bed with alarm on and all needs within reach. Continue with current plan of care.      Pain No/Denies Pain   Therapy/Group: Individual Therapy  Willow Shidler 08/07/2019, 12:25 PM

## 2019-08-07 NOTE — Progress Notes (Signed)
Nutrition Follow-up  DOCUMENTATION CODES:   Not applicable  INTERVENTION:   - Trial Ensure Max po BID, each supplement provides 150 kcal and 30 grams of protein  - Magic cup TID with meals, each supplement provides 290 kcal and 9 grams of protein  - Snacks BID between meals  - Encourage adequate PO intake  - Continue MVI with minerals daily  - d/c Ensure Enlive  NUTRITION DIAGNOSIS:   Increased nutrient needs related to cancer and cancer related treatments as evidenced by estimated needs.  Ongoing, being addressed via oral nutrition supplements and snacks  GOAL:   Patient will meet greater than or equal to 90% of their needs  Progressing  MONITOR:   PO intake, Supplement acceptance, Labs, Weight trends, I & O's  REASON FOR ASSESSMENT:   Malnutrition Screening Tool    ASSESSMENT:   73 year old female with PMH of pulmonary emboli maintained on anticoagulation, metastatic ovarian cancer s/p hysterectomy as well as chemotherapy, microcytic anemia, CKD stage III. Pt with recent admission 07/03/19 to 07/17/19 for PRES thought to be secondary to chemotherapy induced severe HTN as well as pneumonia. Pt was discharged home with assistance of her family. Pt presented 07/26/19 with AMS, tremors, and decreased appetite. Chest x-ray showing small left pleural effusion and left lung base atelectasis or infiltrate. Neurology services suspect acute toxic metabolic encephalopathy. Pt's mental status continues to improve. Tolerating a regular diet. Pt admitted to CIR on 8/21.  Weight up 2 lbs since admission.  Spoke with pt at bedside. Pt reports appetite has continued to improve and that she is hungry at this time. RD provided pt with some crackers as lunch had not yet been delivered.  Pt reports that normally she has been eating well at meals but that breakfast this AM was "terrible." Pt states that the pancakes were tough and the grits did not come with butter. Meal completion for  breakfast charted as 25%.  Pt reports she does not like the Ensure Enlive shakes and will not drink them. Noted Premier Protein shake at bedside. Pt believes that someone brought this to her from home. Pt is willing to try Ensure Max as it is comparable to Premier Protein. Will monitor for acceptance.  Pt states she would like to receive snacks between meals. RD to order via [facility].  Meal Completion: 25-100% x last 8 recorded meals (averaging 68%)  Medications reviewed and include: Calcitriol, Ensure Enlive BID, Megace 40 mg BID, MVI with minerals, K-dur 20 mEq daily  Labs reviewed: potassium 3.3, hemoglobin 7.9  NUTRITION - FOCUSED PHYSICAL EXAM:    Most Recent Value  Orbital Region  Mild depletion  Upper Arm Region  No depletion  Thoracic and Lumbar Region  No depletion  Buccal Region  No depletion  Temple Region  Mild depletion  Clavicle Bone Region  Mild depletion  Clavicle and Acromion Bone Region  Mild depletion  Scapular Bone Region  No depletion  Dorsal Hand  Mild depletion  Patellar Region  Mild depletion  Anterior Thigh Region  Mild depletion  Posterior Calf Region  Mild depletion  Edema (RD Assessment)  Mild [generalized]  Hair  Reviewed  Eyes  Reviewed  Mouth  Reviewed  Skin  Reviewed  Nails  Reviewed      Diet Order:   Diet Order            Diet regular Room service appropriate? Yes; Fluid consistency: Thin  Diet effective now  EDUCATION NEEDS:   No education needs have been identified at this time  Skin:  Skin Assessment: Reviewed RN Assessment (MASD to buttocks)  Last BM:  08/06/19  Height:   Ht Readings from Last 1 Encounters:  08/02/19 5\' 2"  (1.575 m)    Weight:   Wt Readings from Last 1 Encounters:  08/07/19 73.2 kg    Ideal Body Weight:  50 kg  BMI:  Body mass index is 29.52 kg/m.  Estimated Nutritional Needs:   Kcal:  [id number]  Protein:  85-100 grams  Fluid:  >/= 1.7 L    Gaynell Face, MS, RD, LDN Inpatient Clinical Dietitian Pager: [phone] Weekend/After Hours: [phone]

## 2019-08-07 NOTE — Progress Notes (Signed)
[place] PHYSICAL MEDICINE & REHABILITATION PROGRESS NOTE   Subjective/Complaints: States that neck pain improved. Slept well last night. Muscle rub helped  ROS: Patient denies fever, rash, sore throat, blurred vision, nausea, vomiting, diarrhea, cough, shortness of breath or chest pain, joint or back pain, headache, or mood change.   Objective:   No results found. Recent Labs    08/05/19 0421  WBC 12.5*  HGB 7.9*  HCT 24.3*  PLT 238   Recent Labs    08/05/19 0421  NA 137  K 3.3*  CL 105  CO2 24  GLUCOSE 105*  BUN 12  CREATININE 0.86  CALCIUM 8.8*    Intake/Output Summary (Last 24 hours) at 08/07/2019 1220 Last data filed at 08/06/2019 1803 Gross per 24 hour  Intake 440 ml  Output -  Net 440 ml     Physical Exam: Vital Signs Blood pressure 137/65, pulse 79, temperature 98 F (36.7 C), temperature source Oral, resp. rate 16, height 5\' 2"  (1.575 m), weight 73.2 kg, SpO2 94 %.  Constitutional: No distress . Vital signs reviewed. HEENT: EOMI, oral membranes moist Neck: supple Cardiovascular: RRR without murmur. No JVD    Respiratory: CTA Bilaterally without wheezes or rales. Normal effort    GI: BS +, non-tender, non-distended  Musculoskeletal:Normal range of motion. Neck less TTP General: No edema.  Neurological: She isalert. Fair insight and awareness. More focused. Mild STM deficits.  Otherwise fairly appropriate. UE 4/5. LE 4/5 prox to distal. No focal sensory findings Skin: Skin iswarmand dry. She isnot diaphoretic.  Psychiatric: pleasant and cooperative   Assessment/Plan: 1. Functional deficits secondary to encephalopathy which require 3+ hours per day of interdisciplinary therapy in a comprehensive inpatient rehab setting.  Physiatrist is providing close team supervision and 24 hour management of active medical problems listed below.  Physiatrist and rehab team continue to assess barriers to discharge/monitor patient progress toward  functional and medical goals  Care Tool:  Bathing  Bathing activity did not occur: Refused Body parts bathed by patient: Buttocks, Front perineal area         Bathing assist Assist Level: Minimal Assistance - Patient > 75%     Upper Body Dressing/Undressing Upper body dressing   What is the patient wearing?: Pull over shirt    Upper body assist Assist Level: Supervision/Verbal cueing    Lower Body Dressing/Undressing Lower body dressing      What is the patient wearing?: Pants, Incontinence brief     Lower body assist Assist for lower body dressing: Minimal Assistance - Patient > 75%     Toileting Toileting    Toileting assist Assist for toileting: Minimal Assistance - Patient > 75%     Transfers Chair/bed transfer  Transfers assist     Chair/bed transfer assist level: Supervision/Verbal cueing     Locomotion Ambulation   Ambulation assist      Assist level: Contact Guard/Touching assist Assistive device: Walker-rolling Max distance: 200   Walk 10 feet activity   Assist     Assist level: Contact Guard/Touching assist Assistive device: Walker-rolling   Walk 50 feet activity   Assist Walk 50 feet with 2 turns activity did not occur: Safety/medical concerns  Assist level: Contact Guard/Touching assist Assistive device: Walker-rolling    Walk 150 feet activity   Assist Walk 150 feet activity did not occur: Safety/medical concerns  Assist level: Contact Guard/Touching assist Assistive device: Walker-rolling    Walk 10 feet on uneven surface  activity   Assist Walk 10  feet on uneven surfaces activity did not occur: Safety/medical concerns         Wheelchair     Assist Will patient use wheelchair at discharge?: No(TBD - mainly for transport)      Wheelchair assist level: Supervision/Verbal cueing Max wheelchair distance: 30 ft    Wheelchair 50 feet with 2 turns activity    Assist        Assist Level:  Dependent - Patient 0%   Wheelchair 150 feet activity     Assist          Blood pressure 137/65, pulse 79, temperature 98 F (36.7 C), temperature source Oral, resp. rate 16, height 5\' 2"  (1.575 m), weight 73.2 kg, SpO2 94 %.  Medical Problem List and Plan: 1.Decreased functional mobilitysecondary to acute toxic metabolic encephalopathy --Continue CIR therapies including PT, OT, and SLP   2. Antithrombotics: -DVT/anticoagulation:Eliquis for history of pulmonary emboli -antiplatelet therapy: N/A 3. Pain Management:cervical discomfort improved  Tylenol as needed  -  kpad for neck  -muscle rub PRN   -reviewed posture, etc 4. Mood:Seroquel 25 mg nightly -antipsychotic agents: N/A 5. Neuropsych: This patientis notyetcapable of making decisions on herown behalf. -improving cognition 6. Skin/Wound Care:Routine skin checks 7. Fluids/Electrolytes/Nutrition:encourage PO  - replete K+ 3.3  -recheck Thursday  8. History of metastatic ovarian cancer with chemotherapy. Follow-up oncology services 9. Acute on chronic renal insufficiency. Baseline creatinine 1.1-1.3 -encourage fluids -Cr. 0.86. 10. Bone mineral metabolism. Followed by endocrinology in [place] 11. Hypertension. Norvasc 10 mg daily, labetalol 50 mg twice daily.   -bp under reasonable control 8/26 12. Hypothyroidism. Synthroid  13. Anemia: likely ACD although hgb has been trending back down over last week   -recheck cbc Thursday  -stool not yet checked for OB  LOS: 5 days A FACE TO [place] 08/07/2019, 12:20 PM

## 2019-08-08 ENCOUNTER — Inpatient Hospital Stay (HOSPITAL_COMMUNITY): Payer: Medicare Other

## 2019-08-08 ENCOUNTER — Inpatient Hospital Stay (HOSPITAL_COMMUNITY): Payer: Medicare Other | Admitting: *Deleted

## 2019-08-08 ENCOUNTER — Inpatient Hospital Stay (HOSPITAL_COMMUNITY): Payer: Medicare Other | Admitting: Speech Pathology

## 2019-08-08 LAB — CBC
HCT: 22.1 % — ABNORMAL LOW (ref 36.0–46.0)
Hemoglobin: 7 g/dL — ABNORMAL LOW (ref 12.0–15.0)
MCH: 31.7 pg (ref 26.0–34.0)
MCHC: 31.7 g/dL (ref 30.0–36.0)
MCV: 100 fL (ref 80.0–100.0)
Platelets: 261 10*3/uL (ref 150–400)
RBC: 2.21 MIL/uL — ABNORMAL LOW (ref 3.87–5.11)
RDW: 21.7 % — ABNORMAL HIGH (ref 11.5–15.5)
WBC: 11.9 10*3/uL — ABNORMAL HIGH (ref 4.0–10.5)
nRBC: 2.8 % — ABNORMAL HIGH (ref 0.0–0.2)

## 2019-08-08 LAB — BASIC METABOLIC PANEL
Anion gap: 9 (ref 5–15)
BUN: 12 mg/dL (ref 8–23)
CO2: 25 mmol/L (ref 22–32)
Calcium: 9 mg/dL (ref 8.9–10.3)
Chloride: 106 mmol/L (ref 98–111)
Creatinine, Ser: 0.85 mg/dL (ref 0.44–1.00)
GFR calc Af Amer: >60 mL/min (ref >60)
GFR calc non Af Amer: >60 mL/min (ref >60)
Glucose, Bld: 106 mg/dL — ABNORMAL HIGH (ref 70–99)
Potassium: 3.7 mmol/L (ref 3.5–5.1)
Sodium: 140 mmol/L (ref 135–145)

## 2019-08-08 LAB — OCCULT BLOOD X 1 CARD TO LAB, STOOL: Fecal Occult Bld: NEGATIVE

## 2019-08-08 MED ORDER — MEGESTROL ACETATE 20 MG PO TABS: 40.0000 mg | ORAL_TABLET | Freq: Two times a day (BID) | ORAL | 0 refills | Status: AC

## 2019-08-08 MED ORDER — LABETALOL HCL 100 MG PO TABS: 50.0000 mg | ORAL_TABLET | Freq: Two times a day (BID) | ORAL | 0 refills | Status: DC

## 2019-08-08 MED ORDER — QUETIAPINE FUMARATE 25 MG PO TABS: 25.0000 mg | ORAL_TABLET | Freq: Every day | ORAL | 0 refills | Status: DC

## 2019-08-08 MED ORDER — ESCITALOPRAM OXALATE 20 MG PO TABS: 20.0000 mg | ORAL_TABLET | Freq: Every day | ORAL | 0 refills | Status: AC

## 2019-08-08 MED ORDER — ADULT MULTIVITAMIN W/MINERALS CH: 1.0000 | ORAL_TABLET | Freq: Every day | ORAL | Status: DC

## 2019-08-08 MED ORDER — LEVOTHYROXINE SODIUM 150 MCG PO TABS: 150.0000 ug | ORAL_TABLET | Freq: Every day | ORAL | 0 refills | Status: DC

## 2019-08-08 MED ORDER — CALCITRIOL 0.5 MCG PO CAPS: 0.5000 ug | ORAL_CAPSULE | Freq: Every day | ORAL | 0 refills | Status: DC

## 2019-08-08 MED ORDER — ACETAMINOPHEN 325 MG PO TABS: 650.0000 mg | ORAL_TABLET | Freq: Four times a day (QID) | ORAL | Status: AC | PRN

## 2019-08-08 MED ORDER — APIXABAN 5 MG PO TABS: 5.0000 mg | ORAL_TABLET | Freq: Two times a day (BID) | ORAL | 0 refills | Status: DC

## 2019-08-08 MED ORDER — AMLODIPINE BESYLATE 10 MG PO TABS: 10.0000 mg | ORAL_TABLET | Freq: Every day | ORAL | 0 refills | Status: DC

## 2019-08-08 MED FILL — ELIQUIS 5 MG TABLET: 5 | 30 days supply | Qty: 60 | Fill #0

## 2019-08-08 MED FILL — QUETIAPINE FUMARATE 25 MG T: 25 | 30 days supply | Qty: 30 | Fill #0

## 2019-08-08 MED FILL — LEVOTHYROXINE 150 MCG TAB: 150 | 30 days supply | Qty: 30 | Fill #0

## 2019-08-08 NOTE — Progress Notes (Signed)
Physical Therapy Session Note  Patient Details  Name: Tanitra Koehler MRN: [id number] Date of Birth: 06-Aug-1946  Today's Date: 08/08/2019 PT Individual Time: 1120-1208 PT Individual Time Calculation (min): 48 min   Short Term Goals: Week 1:  PT Short Term Goal 1 (Week 1): = LTGs due to ELOS      Skilled Therapeutic Interventions/Progress Updates:  Pt resting in bed.  She denied pain.    Supine> sit with supervision.  Sit> stand iwht supervision to RW.  Gait training with RW on level tile x 100' with close supervision.  NuStep at level 3 x 7 minutes for activity tolerance. Standing with bil UE support for 10 x 1 each mini squats, heel/toe raises.  Pt reported that she needed to use toilet.  Gait as above to return to room.  Toilet transfer with supervision.  Pt continent of bowels, using bowl set into toilet,  for sample.  Clothing and peri care with supervision in standing.  Sit> supine with supervision.  At end of session, pt resting in bed with needs at hand and alarm set.     Therapy Documentation Precautions:  Precautions Precautions: Fall Precaution Comments: has been having trunk extension "tone" with standing previously Restrictions Weight Bearing Restrictions: No Other Position/Activity Restrictions: pts daughter present 24/7 and is a nurse  Pain: pt denied       Therapy/Group: Individual Therapy  Guinevere Stephenson 08/08/2019, 12:30 PM

## 2019-08-08 NOTE — Evaluation (Signed)
Recreational Therapy Assessment and Plan  Patient Details  Name: Rhonda Hale MRN: [id number] Date of Birth: [birth year] Today's Date: 08/08/2019  Rehab Potential:  Good ELOS:   discharge 8/29  Assessment  Problem List:      Patient Active Problem List   Diagnosis Date Noted  . Toxic metabolic encephalopathy 27/74/1287  . Hyponatremia 07/31/2019  . AMS (altered mental status) 07/27/2019  . Dehydration 07/26/2019  . PRES (posterior reversible encephalopathy syndrome) 07/07/2019  . AKI (acute kidney injury) ([place]) 07/03/2019  . Hypercalcemia 07/03/2019  . Hypomagnesemia, chronic 07/03/2019  . Anticoagulated 04/15/2019  . Ovary cancer ([place]) 07/03/2017  . Postoperative hypothyroidism 01/01/2013    Past Medical History:      Past Medical History:  Diagnosis Date  . Cancer ([place])   . COPD (chronic obstructive pulmonary disease) ([place])   . Hypertension    Past Surgical History:       Past Surgical History:  Procedure Laterality Date  . ABDOMINAL HYSTERECTOMY N/A     Assessment & Plan Clinical Impression: Patient is a 73 year old right-handed female with history of pulmonary emboli maintained on anticoagulation, bone mineral metabolism followed by endocrinology services [place], metastatic ovarian cancer status post hysterectomy as well as chemotherapy and microcytic anemia, CKD stage III with baseline creatinine 1.1-1.3, as well as recent admission 07/03/2019 to 07/17/2019 for 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 She was discharged home with assistance of her family independent with ADLs using assistive device. Patient was completing a course of Bactrim as well as low-dose steroids. Presented 07/26/2019 with altered mental status, tremors as well as decrease in appetite. Daughter reports urinary and fecal incontinence at baseline. In the ED potassium 5.9, sodium 127, BUN 36,  creatinine 1.80, COVID negative, urine culture 80,000 Staphylococcus epidermidis, WBC 14,600, hemoglobin 9.1, ammonia level 18. Chest x-ray small left pleural effusion and left lung base atelectasis or infiltrate, cranial CT/MRI scan negative for acute changes. No intracranial mass or hemorrhage. No correlation seen on head CT or imaging suggesting posterior reversible encephalopathy syndrome. Patient with noted bouts of mild clonus and jerking with EEG negative for seizure. Follow-up neurology services suspect acute toxic metabolic encephalopathy. Renal service follow-up for hyponatremia and hyperkalemia. SSRI held due to hyponatremia. Shedid respond well to normal saline at 75 an hour after switch from D5Wand latest sodium 133. Her mental status continues to improve and she continues on low-dose Seroquel. Tolerating a regular diet. Patient transferred to CIR on 08/02/2019 .   Met with pt briefly today to discuss leisure interests, activity analysis with potential adaptations, safety with leisure and discharge planning.  Pt is anxious to return home with family and return to her leisure interests.  Pt stated understanding of education.  Plan No further TR as pt is discharging home with family 8/29  Recommendations for other services: None   Discharge Criteria: Patient will be discharged from TR if patient refuses treatment 3 consecutive times without medical reason.  If treatment goals not met, if there is a change in medical status, if patient makes no progress towards goals or if patient is discharged from hospital.  The above assessment, treatment plan, treatment alternatives and goals were discussed and mutually agreed upon: by patient  Garey 08/08/2019, 12:37 PM

## 2019-08-08 NOTE — Progress Notes (Signed)
Occupational Therapy Session Note  Patient Details  Name: Rhonda Hale MRN: [id number] Date of Birth: [birth year]   Short Term Goals: Week 1:  OT Short Term Goal 1 (Week 1): n/a d/t elos  Skilled Therapeutic Interventions/Progress Updates:    Today's Date: 08/08/2019 OT Individual Time:  [id number]      1:1. Pt received in bed with 8/10 neck pain. Heat provided at end of session and rest breaks PRN. Pt completes stand pivot transfer iwht S. Pt bathes UB with A for back and dons shirt with S. Pt reporting need to toilet and completes with RW with VC for reach back and Rw management during transfers. Pt returns to sink to wash LB , peri area and buttocks in seated and standing with S. Pt dons underwear, socks and pants sit to stand with S and increased time for threading BLE into pants. Pt completes changing bed linens. Pt demo decreased organization, sequencing, noticing/responding to errors, endurance and stabilization during task. Pt requires use of one hand on bed frequently to walk around surface. Pt takes 6 rest breaks throughout task d/t decreased activity tolerance. Pt initially using flat sheet at fitted sheet with VC for redirection to using correct sheet. Pt ambulates to daroom with RW and supervision with improved carry over of RW maangmet and hand placement. Pt completes level 1 theraband exercises with directional cues 2x15: biceps, triceps, row, and band pull apart to strengthen BUE required for BADls. Exited session with pt seated in w/c, call light tin reach and all needs met.  Session 2:  Today's Date: 08/08/2019 OT Individual Time: 3295-1884 OT Individual Time Calculation (min): 42 min   Pt received in bed agreeable to tx. Pt ambulates to all tx destinations with S using RW and VC for looking forward.  Bi[employer]x standing balance activities with emphasis on weight shifting with CGA for tactile input to facilitate weight shift provided 30-40% of time  Limits of stability 29% Maze  game -64%  Catch game avg time to reach target 2.6 seconds and reaction time to begin weight shift 0.15 seconds  Furniture transfers with S and RW from couch and reclienr in prep for d/c home. Pt ed re posterior method to enter shower with RW if grab bars are not installed. Pt return demo with S. Exited session with pt seated in bed, exit alarm on and call light inr each  Therapy Documentation Precautions:  Precautions Precautions: Fall Precaution Comments: has been having trunk extension "tone" with standing previously Restrictions Weight Bearing Restrictions: No Other Position/Activity Restrictions: pts daughter present 24/7 and is a nurse General:   Vital Signs: Therapy Vitals Pulse Rate: 81 Resp: 16 Patient Position (if appropriate): Lying Oxygen Therapy SpO2: 96 % O2 Device: Room Air Pain: Pain Assessment Pain Scale: 0-10 Pain Score: 8  Pain Type: Acute pain Pain Location: Neck Pain Orientation: Left Pain Descriptors / Indicators: Aching Pain Frequency: Intermittent Pain Onset: On-going(when sitting upright) Pain Intervention(s): Medication (See eMAR) ADL: ADL Grooming: Supervision/safety Where Assessed-Grooming: Sitting at sink Upper Body Bathing: Supervision/safety Where Assessed-Upper Body Bathing: Shower Lower Body Bathing: Minimal assistance Where Assessed-Lower Body Bathing: Shower Upper Body Dressing: Minimal assistance Where Assessed-Upper Body Dressing: Sitting at sink Lower Body Dressing: Minimal assistance Where Assessed-Lower Body Dressing: Sitting at sink, Standing at sink Toileting: Minimal assistance Where Assessed-Toileting: Glass blower/designer: Psychiatric nurse Method: Arts development officer: Raised toilet seat Social research officer, government: Environmental education officer Method: Radiographer, therapeutic: Civil engineer, contracting  with back Vision   Perception    Praxis   Exercises:   Other  Treatments:     Therapy/Group: Individual Therapy  Tonny Branch 08/08/2019, 9:42 AM

## 2019-08-08 NOTE — Progress Notes (Signed)
[place] PHYSICAL MEDICINE & REHABILITATION PROGRESS NOTE   Subjective/Complaints: Having mild neck discomfort again. Asked what could be causing. Didn't recall our previous discussions  ROS: Patient denies fever, rash, sore throat, blurred vision, nausea, vomiting, diarrhea, cough, shortness of breath or chest pain, joint or back pain, headache, or mood change.    Objective:   No results found. Recent Labs    08/08/19 0525  WBC 11.9*  HGB 7.0*  HCT 22.1*  PLT 261   Recent Labs    08/08/19 0502  NA 140  K 3.7  CL 106  CO2 25  GLUCOSE 106*  BUN 12  CREATININE 0.85  CALCIUM 9.0    Intake/Output Summary (Last 24 hours) at 08/08/2019 1052 Last data filed at 08/08/2019 0830 Gross per 24 hour  Intake 700 ml  Output -  Net 700 ml     Physical Exam: Vital Signs Blood pressure (!) 105/49, pulse 81, temperature 98.2 F (36.8 C), resp. rate 16, height 5\' 2"  (1.575 m), weight 73.2 kg, SpO2 96 %.  Constitutional: No distress . Vital signs reviewed. HEENT: EOMI, oral membranes moist Neck: supple Cardiovascular: RRR without murmur. No JVD    Respiratory: CTA Bilaterally without wheezes or rales. Normal effort    GI: BS +, non-tender, non-distended  Musculoskeletal:Normal range of motion. Mild left cervical TTP General: No edema.  Neurological: She isalert. Fair insight and awareness. More focused. Mild STM deficits still evident.  Otherwise fairly appropriate. UE 4/5. LE 4/5 prox to distal. No focal sensory findings Skin: Skin iswarmand dry. She isnot diaphoretic.  Psychiatric: pleasant and cooperative   Assessment/Plan: 1. Functional deficits secondary to encephalopathy which require 3+ hours per day of interdisciplinary therapy in a comprehensive inpatient rehab setting.  Physiatrist is providing close team supervision and 24 hour management of active medical problems listed below.  Physiatrist and rehab team continue to assess barriers to  discharge/monitor patient progress toward functional and medical goals  Care Tool:  Bathing  Bathing activity did not occur: Refused Body parts bathed by patient: Buttocks, Front perineal area         Bathing assist Assist Level: Minimal Assistance - Patient > 75%     Upper Body Dressing/Undressing Upper body dressing   What is the patient wearing?: Pull over shirt    Upper body assist Assist Level: Supervision/Verbal cueing    Lower Body Dressing/Undressing Lower body dressing      What is the patient wearing?: Pants, Incontinence brief     Lower body assist Assist for lower body dressing: Minimal Assistance - Patient > 75%     Toileting Toileting    Toileting assist Assist for toileting: Minimal Assistance - Patient > 75%     Transfers Chair/bed transfer  Transfers assist     Chair/bed transfer assist level: Contact Guard/Touching assist     Locomotion Ambulation   Ambulation assist      Assist level: Contact Guard/Touching assist Assistive device: Walker-rolling Max distance: 179ft   Walk 10 feet activity   Assist     Assist level: Contact Guard/Touching assist Assistive device: Walker-rolling   Walk 50 feet activity   Assist Walk 50 feet with 2 turns activity did not occur: Safety/medical concerns  Assist level: Contact Guard/Touching assist Assistive device: Walker-rolling    Walk 150 feet activity   Assist Walk 150 feet activity did not occur: Safety/medical concerns  Assist level: Contact Guard/Touching assist Assistive device: Walker-rolling    Walk 10 feet on uneven surface  activity   Assist Walk 10 feet on uneven surfaces activity did not occur: Safety/medical concerns         Wheelchair     Assist Will patient use wheelchair at discharge?: No(TBD - mainly for transport)      Wheelchair assist level: Supervision/Verbal cueing Max wheelchair distance: 30 ft    Wheelchair 50 feet with 2 turns  activity    Assist        Assist Level: Dependent - Patient 0%   Wheelchair 150 feet activity     Assist          Blood pressure (!) 105/49, pulse 81, temperature 98.2 F (36.8 C), resp. rate 16, height 5\' 2"  (1.575 m), weight 73.2 kg, SpO2 96 %.  Medical Problem List and Plan: 1.Decreased functional mobilitysecondary to acute toxic metabolic encephalopathy --Continue CIR therapies including PT, OT, and SLP   2. Antithrombotics: -DVT/anticoagulation:Eliquis for history of pulmonary emboli -antiplatelet therapy: N/A 3. Pain Management:cervical discomfort improving  Tylenol as needed  -  kpad for neck--request again  -muscle rub PRN   -reviewed posture, etc 4. Mood:Seroquel 25 mg nightly -antipsychotic agents: N/A 5. Neuropsych: This patientis notyetcapable of making decisions on herown behalf. -improving cognition 6. Skin/Wound Care:Routine skin checks 7. Fluids/Electrolytes/Nutrition:encourage PO  - replete K+--> 3.7 today     8. History of metastatic ovarian cancer with chemotherapy. Follow-up oncology services 9. Acute on chronic renal insufficiency. Baseline creatinine 1.1-1.3 -encourage fluids -Cr. 0.85 10. Bone mineral metabolism. Followed by endocrinology in [place] 11. Hypertension. Norvasc 10 mg daily, labetalol 50 mg twice daily.   -bp under reasonable control 8/26 12. Hypothyroidism. Synthroid  13. Anemia: likely ACD although hgb has been trending back down again over last week   -hgb down to 7 today (7.9 previously). Has required blood txfn in hospital  -need stool checked for OB  LOS: 6 days A FACE TO [place] 08/08/2019, 10:52 AM

## 2019-08-08 NOTE — Discharge Summary (Signed)
Physician Discharge Summary  Patient ID: Rhonda Hale MRN: [id number] DOB/AGE: 03-19-1946 73 y.o.  Admit date: 08/02/2019 Discharge date: 08/10/2019  Discharge Diagnoses:  Principal Problem:   Toxic metabolic encephalopathy Active Problems:   AMS (altered mental status)   Anticoagulated   Postoperative hypothyroidism   Ovary cancer ([place]) DVT prophylaxis Mood stabilization Acute on chronic renal insufficiency Hypertension Hypothyroidism Anemia of chronic disease  Discharged Condition: Stable  Significant Diagnostic Studies: Ct Head Wo Contrast  Result Date: 07/27/2019 CLINICAL DATA:  Encephalopathy. EXAM: CT HEAD WITHOUT CONTRAST TECHNIQUE: Contiguous axial images were obtained from the base of the skull through the vertex without intravenous contrast. COMPARISON:  Head CT dated 07/09/2019. Brain MRI dated 07/06/2019. FINDINGS: Brain: Ventricles are stable in size and configuration. Generalized mild age related parenchymal volume loss with commensurate dilatation of the sulci. Chronic small vessel ischemic changes again noted within the bilateral periventricular and subcortical white matter regions bilaterally. No mass, hemorrhage, edema or other evidence of acute parenchymal abnormality identified. No extra-axial hemorrhage. Vascular: Chronic calcified atherosclerotic changes of the large vessels at the skull base. No unexpected hyperdense vessel. Skull: Normal. Negative for fracture or focal lesion. Sinuses/Orbits: No acute finding. Other: None. IMPRESSION: 1. No acute findings. No intracranial mass, hemorrhage or edema. No correlate seen on today's head CT for the findings suggesting posterior reversible encephalopathy syndrome (PRES) on brain MRI of 07/06/2019. 2. Chronic small vessel ischemic changes within the white matter. Electronically Signed   By: Franki Cabot M.D.   On: 07/27/2019 11:17   Ct Chest Wo Contrast  Result Date: 07/11/2019 CLINICAL DATA:  Pneumonia EXAM: CT CHEST  WITHOUT CONTRAST TECHNIQUE: Multidetector CT imaging of the chest was performed following the standard protocol without IV contrast. COMPARISON:  None. FINDINGS: Cardiovascular: Left coronary artery calcifications. Normal heart size. No pericardial effusion. Right chest port catheter. Mediastinum/Nodes: No enlarged mediastinal, hilar, or axillary lymph nodes. Calcified mediastinal and hilar lymph nodes. Thyroid gland, trachea, and esophagus demonstrate no significant findings. Lungs/Pleura: There is extensive bilateral irregular ground-glass and consolidative opacity. Small bilateral pleural effusions with associated atelectasis or consolidation. Evidence of prior wedge resection of the left upper lobe. Upper Abdomen: No acute abnormality. Musculoskeletal: No chest wall mass or suspicious bone lesions identified. IMPRESSION: 1. There is extensive bilateral irregular ground-glass and consolidative opacity. Small bilateral pleural effusions with associated atelectasis or consolidation. Findings are most consistent with multifocal infection. Although no discrete pulmonary nodules are identified, metastatic disease is not strictly excluded given history of ovarian malignancy. 2.  Evidence of prior wedge resection of the left upper lobe. 3.  Coronary artery disease. Electronically Signed   By: Eddie Candle M.D.   On: 07/11/2019 13:26   Mr Brain Wo Contrast  Result Date: 07/30/2019 CLINICAL DATA:  Altered mental status, unclear cause. EXAM: MRI HEAD WITHOUT CONTRAST TECHNIQUE: Multiplanar, multiecho pulse sequences of the brain and surrounding structures were obtained without intravenous contrast. COMPARISON:  Head CT 07/27/2019, brain MRI 07/06/2019 FINDINGS: Brain: Multiple sequences are motion degraded. Susceptibility artifact obscures portions of the anterior intracranial contents and posterior fossa limiting evaluation for acute infarct. Within this limitation, no restricted diffusion is demonstrated to suggest  acute infarction. Susceptibility artifact also severely degrades the susceptibility weighted imaging, rendering this sequences essentially non-diagnostic. This limits evaluation for intracranial hemorrhage. No evidence of intracranial mass. No midline shift or extra-axial collection. Moderate scattered T2/FLAIR hyperintensity within the cerebral white matter and brainstem is nonspecific, but consistent with chronic small vessel ischemic disease. Redemonstrated small chronic lacunar  infarct within the right cerebellum. Mild generalized parenchymal atrophy. Vascular: Signal abnormality within the distal right vertebral artery which may reflect slow flow or high-grade stenosis/occlusion. Flow voids are otherwise maintained within the proximal large arterial vessels. Skull and upper cervical spine: Normal marrow signal. Sinuses/Orbits: Susceptibility artifact obscures portions of the right orbit and maxillary sinus on multiple sequences. The visualized globes and orbits demonstrate no acute abnormality. Mild scattered paranasal sinus mucosal thickening. Trace fluid within bilateral mastoid air cells. The second impression below will be called to the ordering clinician or representative by the Radiologist Assistant, and communication documented in the PACS or zVision Dashboard. IMPRESSION: Examination significantly limited by susceptibility artifact, and to a lesser degree by patient motion as described. Signal abnormality within the non-dominant distal right vertebral artery, which may reflect slow flow or high-grade stenosis/occlusion. Otherwise, no evidence of acute intracranial abnormality. Mild generalized parenchymal atrophy with moderate chronic small vessel ischemic disease. Redemonstrated small chronic right cerebellar lacunar infarct. Electronically Signed   By: Kellie Simmering   On: 07/30/2019 11:15   Dg Chest Portable 1 View  Result Date: 07/26/2019 CLINICAL DATA:  73 year old female with recent pneumonia  and hospital admission 1 week ago. EXAM: PORTABLE CHEST 1 VIEW COMPARISON:  Chest radiograph dated 07/10/2019 and CT dated 07/11/2019 FINDINGS: Left lung base hazy density likely combination of a small pleural effusion and associated left lung base atelectasis or infiltrate. Overall there has been interval improvement of the aeration of the lungs and clearance of the hazy densities in the left upper lobe and right lung base. There is no pneumothorax. The cardiac silhouette is within normal limits. Right pectoral Port-A-Cath with tip at the cavoatrial junction. No acute osseous pathology. Left upper lobe surgical sutures noted. IMPRESSION: Small left pleural effusion and left lung base atelectasis or infiltrate. Overall interval improvement of the aeration of the lungs compared to the prior radiograph. Electronically Signed   By: Anner Crete M.D.   On: 07/26/2019 19:31   Dg Chest Port 1 View  Result Date: 07/10/2019 CLINICAL DATA:  Shortness of breath and weakness. EXAM: PORTABLE CHEST 1 VIEW COMPARISON:  07/08/2019 FINDINGS: Right IJ Port-A-Cath unchanged. Postsurgical changes over the left perihilar region and upper lung. Lungs are adequately inflated. Mild prominence of the perihilar markings suggesting mild degree of vascular congestion. No evidence of effusion or pneumothorax. Mild stable cardiomegaly. Remainder of the exam is unchanged. IMPRESSION: Mild prominence of the perihilar markings suggesting mild degree of vascular congestion. Right IJ Port-A-Cath unchanged Electronically Signed   By: Marin Olp M.D.   On: 07/10/2019 12:58    Labs:  Basic Metabolic Panel: Recent Labs  Lab 08/03/19 1123 08/05/19 0421 08/08/19 0502  NA 137 137 140  K 3.9 3.3* 3.7  CL 104 105 106  CO2 22 24 25   GLUCOSE 161* 105* 106*  BUN 11 12 12   CREATININE 0.86 0.86 0.85  CALCIUM 8.4* 8.8* 9.0  PHOS 2.8  --   --     CBC: Recent Labs  Lab 08/05/19 0421 08/08/19 0525 08/09/19 0424  WBC 12.5* 11.9*  PENDING  NEUTROABS 6.5  --   --   HGB 7.9* 7.0* 7.1*  HCT 24.3* 22.1* 21.9*  MCV 96.8 100.0 98.6  PLT 238 261 260    CBG: Recent Labs  Lab 08/02/19 1648  GLUCAP 147*   Family history.  Mother and father with hypertension as well as hyperlipidemia.  Denies any colon cancer or diabetes  Brief HPI:   Rhonda Hale  is a 73 y.o. right-handed female with history of pulmonary emboli maintained on anticoagulation, bone mineral metabolism followed by endocrinology services in [facility], metastatic ovarian cancer status post hysterectomy as well as chemotherapy and microcytic anemia with CKD stage III baseline creatinine 1.1-1.3 as well as recent admission 07/03/2019 to 07/17/2019 for 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  She was discharged home with assistance of her family independent with ADLs using assistive device.  Patient was completing a course of Bactrim as well as low-dose steroids.  Presented 07/26/2019 with altered mental status, tremors as well as decrease in appetite.  Daughter reports urinary and fecal incontinence at baseline.  In the ED potassium 5.9, sodium 127, BUN 36, creatinine 1.80, COVID negative, urine culture 80,000 Staphylococcus, WBC 14,600, hemoglobin 9.1, ammonia level 18.  Chest x-ray small left pleural effusion and left lung base atelectasis or infiltrate, cranial CT and MRI negative for acute changes no intracranial mass or hemorrhage.  No correlation seen on head CT or imaging suggesting posterior reversible encephalopathy syndrome.  Patient with bouts of mild clonus jerking with EEG negative for seizure.  Follow-up neurology services suspect acute metabolic encephalopathy.  Renal service follow-up for hyponatremia and hyperkalemia.  SSRI held due to hyponatremia.  She did respond to normal saline IV fluids her mental status improved and she did continue on low-dose Seroquel.  Tolerating a regular  diet.  Admitted for a comprehensive rehab program   Hospital Course: Rhonda Hale was admitted to rehab 08/02/2019 for inpatient therapies to consist of PT, ST and OT at least three hours five days a week. Past admission physiatrist, therapy team and rehab RN have worked together to provide customized collaborative inpatient rehab.  Pertaining to patient acute metabolic encephalopathy she continued to participate with therapies follow-up routine labs as indicated.  She continued on chronic Eliquis for history of pulmonary emboli no chest pain or shortness of breath.  Pain management use of Tylenol as well as a K pad.  Mood stabilization with Seroquel at nighttime she was tolerating this quite well.  Latest chemistries much improved sodium 140, potassium 3.7, BUN 12, creatinine 0.85, hemoglobin 7.1, hematocrit 22.1.  Noted history of chronic anemia and monitor closely patient asymptomatic with occult blood negative.  Blood pressures controlled with Norvasc as well as labetalol.  Patient follow-up primary care provider she continued on Synthroid for hypothyroidism.  In regards to patient's history of bone mineral metabolism she is followed by endocrinology service in [facility] Dr. Heron Nay   Blood pressures were monitored on TID basis and remained stable    /She is incontinent of bowel and bladder at baseline with routine toileting.  She has made gains during rehab stay and is participating fully  She will continue to receive follow up therapies   after discharge  Rehab course: During patient's stay in rehab weekly team conferences were held to monitor patient's progress, set goals and discuss barriers to discharge. At admission, patient required min to mod assist to ambulate 10 feet rolling walker, minimal assist stand pivot transfers, minimal assist sit to supine.  Minimal assist upper body dressing moderate assist lower body dressing minimal assist toilet transfers  Physical exam.   Blood pressure 129/61 pulse 70 temperature 98.3 respirations 18 oxygen saturation 97% room air Constitutional.  No distress HEENT Head.  Normocephalic and atraumatic Eyes.  Pupils round and reactive to light EOMs normal no discharge Neck.  Supple nontender no JVD  no tracheal deviation no thyromegaly Cardiovascular normal rate and rhythm without friction rub or murmur heard Respiratory effort normal breath sounds normal no respiratory distress without wheezes GI.  Soft positive bowel sounds no distention nontender Musculoskeletal normal range of motion no edema Neurological.  Fair insight and awareness some delay in processing upper extremity 4 out of 5 lower extremities 4 out of 5 proximal to distal no sensory deficits  She  has had improvement in activity tolerance, balance, postural control as well as ability to compensate for deficits. She has had improvement in functional use RUE/LUE  and RLE/LLE as well as improvement in awareness.  Supine to sit edge of bed supervision.  Able to don spandex supervision.  Ambulating rolling walker supervision 150 feet.  Working with dynamic balance.  Gathered belongings for activities a living and homemaking.  Speech therapy sessions focused on cognitive goals patient able to recall her current medications and functions 90% accurate.  Mental status greatly improved.  Family teaching completed plan discharged home       Disposition: Discharge disposition: 01-Home or Self Care     Discharge to home   Diet: Regular  Special Instructions: No driving smoking or alcohol  Follow-up CBC with internal medicine group Tuesday, 08/13/2019  Medications at discharge. 1.  Tylenol as needed 2.  Norvasc 10 mg p.o. daily 3.  Eliquis 5 mg p.o. twice daily 4.  Rocaltrol 0.5 mcg p.o. daily 5.  Lexapro 20 mg p.o. daily 6.  Labetalol 50 mg p.o. twice daily 7.  Synthroid 150 mcg p.o. daily 8.  Megace 40 mg p.o. twice daily 9.  Multivitamin daily 10.  Seroquel 25  mg p.o. nightly 11.  Ellipta 1 puff daily  Discharge Instructions    Ambulatory referral to Physical Medicine Rehab   Complete by: As directed    Moderate complexity follow up 1-2 weeks toxic metabolic encephalopathy     Allergies as of 08/09/2019      Reactions   Atorvastatin Other (See Comments)   Severe heartburn, reflux      Medication List    STOP taking these medications   dexamethasone 4 MG tablet Commonly known as: DECADRON   ondansetron 8 MG tablet Commonly known as: ZOFRAN   rivaroxaban 20 MG Tabs tablet Commonly known as: XARELTO     TAKE these medications   acetaminophen 325 MG tablet Commonly known as: TYLENOL Take 2 tablets (650 mg total) by mouth every 6 (six) hours as needed for mild pain (or Fever >/= 101).   amLODipine 10 MG tablet Commonly known as: NORVASC Take 1 tablet (10 mg total) by mouth daily.   apixaban 5 MG Tabs tablet Commonly known as: ELIQUIS Take 1 tablet (5 mg total) by mouth 2 (two) times daily.   calcitRIOL 0.5 MCG capsule Commonly known as: ROCALTROL Take 1 capsule (0.5 mcg total) by mouth daily. What changed: when to take this   Calcium-Vitamin D 600-400 MG-UNIT Tabs Take 1 tablet by mouth 2 (two) times a day.   escitalopram 20 MG tablet Commonly known as: LEXAPRO Take 1 tablet (20 mg total) by mouth daily.   labetalol 100 MG tablet Commonly known as: NORMODYNE Take 0.5 tablets (50 mg total) by mouth 2 (two) times daily.   levothyroxine 150 MCG tablet Commonly known as: SYNTHROID Take 1 tablet (150 mcg total) by mouth daily before breakfast. What changed:   medication strength  how much to take   lidocaine-prilocaine cream Commonly known as: EMLA Apply 1 application  topically daily as needed (prior to port access).   megestrol 20 MG tablet Commonly known as: MEGACE Take 2 tablets (40 mg total) by mouth 2 (two) times daily.   multivitamin with minerals Tabs tablet Take 1 tablet by mouth daily.   QUEtiapine  25 MG tablet Commonly known as: SEROQUEL Take 1 tablet (25 mg total) by mouth at bedtime.   Spiriva Respimat 1.25 MCG/ACT Aers Generic drug: Tiotropium Bromide Monohydrate Inhale 1 puff into the lungs daily as needed (shortness of breath).      Follow-up Information    Meredith Staggers, MD Follow up.   Specialty: Physical Medicine and Rehabilitation Why: As directed Contact information: [address] [place] [address] [phone]        Anderson Malta, MD Follow up.   Specialty: Specialist Why: Call for appointment Contact information: [place] [address] [phone]           Signed: Lavon Paganini [place] 08/09/2019, 5:25 AM

## 2019-08-08 NOTE — Progress Notes (Signed)
Speech Language Pathology Daily Session Note  Patient Details  Name: Rhonda Hale MRN: [id number] Date of Birth: November 16, 1946  Today's Date: 08/08/2019 SLP Individual Time: 0730-0815 SLP Individual Time Calculation (min): 45 min  Short Term Goals: Week 1: SLP Short Term Goal 1 (Week 1): STGs=LTGs due to short length of stay  Skilled Therapeutic Interventions: Skilled treatment session focused on cognitive goals. Upon arrival, patient was asleep in bed but easily awakened. SLP facilitated session by providing supervision level verbal cues for recall of functional information like events from previous therapy sessions and d/c dates. Patient reporting neck pain, therefore, RN called and administered medications. Patient independently recalled the functions of the medications and was able to carryover information from previous session. Patient consumed breakfast meal with Mod I for tray set-up and selective attention to task. Patient left upright in bed with alarm on and all needs within reach. Continue with current plan of care.      Pain Pain Assessment Pain Scale: 0-10 Pain Score: 8  Pain Type: Acute pain Pain Location: Neck Pain Orientation: Left Pain Descriptors / Indicators: Aching Pain Frequency: Intermittent Pain Onset: On-going(when sitting upright) Pain Intervention(s): Medication (See eMAR)  Therapy/Group: Individual Therapy  Rhonda Hale 08/08/2019, 9:23 AM

## 2019-08-09 ENCOUNTER — Inpatient Hospital Stay (HOSPITAL_COMMUNITY): Payer: Medicare Other | Admitting: Physical Therapy

## 2019-08-09 ENCOUNTER — Inpatient Hospital Stay (HOSPITAL_COMMUNITY): Payer: Medicare Other

## 2019-08-09 ENCOUNTER — Inpatient Hospital Stay (HOSPITAL_COMMUNITY): Payer: Medicare Other | Admitting: Speech Pathology

## 2019-08-09 LAB — PREPARE RBC (CROSSMATCH)

## 2019-08-09 LAB — CBC
HCT: 21.9 % — ABNORMAL LOW (ref 36.0–46.0)
Hemoglobin: 7.1 g/dL — ABNORMAL LOW (ref 12.0–15.0)
MCH: 32 pg (ref 26.0–34.0)
MCHC: 32.4 g/dL (ref 30.0–36.0)
MCV: 98.6 fL (ref 80.0–100.0)
Platelets: 260 10*3/uL (ref 150–400)
RBC: 2.22 MIL/uL — ABNORMAL LOW (ref 3.87–5.11)
RDW: 21.9 % — ABNORMAL HIGH (ref 11.5–15.5)
WBC: 11.5 10*3/uL — ABNORMAL HIGH (ref 4.0–10.5)
nRBC: 3.7 % — ABNORMAL HIGH (ref 0.0–0.2)

## 2019-08-09 LAB — IRON AND TIBC
Iron: 50 ug/dL (ref 28–170)
Saturation Ratios: 20 % (ref 10.4–31.8)
TIBC: 246 ug/dL — ABNORMAL LOW (ref 250–450)
UIBC: 196 ug/dL

## 2019-08-09 LAB — FERRITIN: Ferritin: 1221 ng/mL — ABNORMAL HIGH (ref 11–307)

## 2019-08-09 MED ORDER — SODIUM CHLORIDE 0.9% IV SOLUTION
Freq: Once | INTRAVENOUS | Status: AC
  Administered 2019-08-09: 16:00:00 via INTRAVENOUS

## 2019-08-09 MED ORDER — ACETAMINOPHEN 325 MG PO TABS
650.0000 mg | ORAL_TABLET | Freq: Once | ORAL | Status: AC
  Administered 2019-08-09: 650 mg via ORAL
  Filled 2019-08-09: qty 2

## 2019-08-09 MED ORDER — HEPARIN SOD (PORK) LOCK FLUSH 100 UNIT/ML IV SOLN
500.0000 [IU] | INTRAVENOUS | Status: DC
  Filled 2019-08-09: qty 5

## 2019-08-09 MED ORDER — DIPHENHYDRAMINE HCL 25 MG PO CAPS
25.0000 mg | ORAL_CAPSULE | Freq: Once | ORAL | Status: AC
  Administered 2019-08-09: 25 mg via ORAL
  Filled 2019-08-09: qty 1

## 2019-08-09 MED ORDER — HEPARIN SOD (PORK) LOCK FLUSH 100 UNIT/ML IV SOLN
500.0000 [IU] | INTRAVENOUS | Status: DC | PRN
  Administered 2019-08-09: 500 [IU]
  Filled 2019-08-09 (×2): qty 5

## 2019-08-09 MED ORDER — FUROSEMIDE 10 MG/ML IJ SOLN
20.0000 mg | Freq: Once | INTRAMUSCULAR | Status: AC
  Administered 2019-08-09: 20 mg via INTRAVENOUS
  Filled 2019-08-09: qty 2

## 2019-08-09 MED FILL — AMLODIPINE BESYLATE 10 MG T: 10 | 30 days supply | Qty: 30 | Fill #0

## 2019-08-09 NOTE — Progress Notes (Signed)
Speech Language Pathology Discharge Summary  Patient Details  Name: Rhonda Hale MRN: [id number] Date of Birth: 09-Sep-1946  Today's Date: 08/09/2019 SLP Individual Time: 1484-0397 SLP Individual Time Calculation (min): 42 min   Skilled Therapeutic Interventions:   Pt was seen for skilled ST services focused on cognition. As a means to gauge pt's progress since admission, SLP administered portions of the Cognistat. Pt scored within the average range for subtest examining visual problem solving, judgement, abstract reasoning, and basic math calculations. Pt's score on short term memory subtest is indicative of a mild impairment. Pt acknowledged that short term memory is still a deficit she would like to work on. SLP educated pt regarding functional compensatory strategies, such as note taking and use of calendars and alarms. Pt also demonstrated emergent awareness throughout Cognistat testing; she was aware of errors and able to correct with Supervision A. She also selectively attended to tasks with Supervision A for redirection. Pt was left in bed with alarm set and all needs within reach. Continue per current plan of care.     Patient has met 7 of 8 long term goals.  Patient to discharge at overall Min;Supervision level.  Reasons goals not met: short term memory and complex problem solving deficits   Clinical Impression/Discharge Summary:   Pt made functional gains and met 7 out of 8 long term goals this admission. Pt currently requires Supervision-Min assist for cognitive tasks and will require 24/7 supervision. Pt has demonstrated improved basic problem solving, selective attention, as well as emergent and anticipatory awareness. However, given that cognitive deficits still present for short term memory and complex functional problem solving, recommend pt continue to receive skilled ST services for complex problem solving and short term memory upon discharge. Pt and family education is complete at  this time.   Care Partner:  Caregiver Able to Provide Assistance: Yes  Type of Caregiver Assistance: Cognitive  Recommendation:  24 hour supervision/assistance;Outpatient SLP;Home Health SLP  Rationale for SLP Follow Up: Maximize cognitive function and independence   Equipment: none   Reasons for discharge: Discharged from hospital   Patient/Family Agrees with Progress Made and Goals Achieved: Yes    Arbutus Leas 08/09/2019, 2:55 PM

## 2019-08-09 NOTE — Progress Notes (Signed)
Incorrect time, correct time was 1611 when VS taken

## 2019-08-09 NOTE — Progress Notes (Signed)
Consent completed for blood product and in shadow chart

## 2019-08-09 NOTE — Progress Notes (Signed)
Stool sample collected via digital rectal examination for hemoccult.  Negative occult card shown to Dr. Naaman Plummer personally after applying developer on the unit.  Requested to perform on the unit for quicker results.  Brita Romp, RN

## 2019-08-09 NOTE — Progress Notes (Signed)
Occupational Therapy Discharge Summary  Patient Details  Name: Rhonda Hale MRN: [id number] Date of Birth: 21-Apr-1946  Today's Date: 08/09/2019 OT Individual Time: 7902-4097 OT Individual Time Calculation (min): 60 min   1:1. Pt received in bed with 8/10 neck pain and RN applies muscle rub. Pt reports need to toilet. Pt completes ambulatory transfer with set up using RW. Pt requires mod VC for toileting as pt insists on attempting to don clean depends over pants and old depends. Pt unable to see failure inlogic until pt attempts to advance all 3 over hips. Once pt able to see flaw in donning order pt doffs all in standing with supervision and transfers to w/c at sink. Pt completes bathing at sink at sit to stand level with S overall. Pt dons shirt, pants, depends and L sock with set up. Pt refuses to fully don R sock because "my arm hurts" despite normally being able to don socks. OT pulls sock over heel.  Pt ambulates to tx gym with S using RW. Pt completes 2x10 sit to stands with 1LE elevated on 6" block to improve LE strengthening and enduarnce. Pt completes standing 2x10  Alternating toe taps on 6" block with S and no AD for LE coordination and balance required for BADLS. Pt misses 15 time d/t IV team needing to access port and draw blood. Exited session with pt seated in bed, exit alarm on and call light in reach  Patient has met 12 of 12 long term goals due to improved activity tolerance, improved balance, improved attention and improved coordination.  Patient to discharge at overall Supervision level.  Patient's care partner is independent to provide the necessary cognitive assistance at discharge.    Reasons goals not met: n/a  Recommendation:  Patient will benefit from ongoing skilled OT services in home health setting to continue to advance functional skills in the area of BADL and iADL.  Equipment: No equipment provided  Reasons for discharge: treatment goals met  Patient/family  agrees with progress made and goals achieved: Yes  OT Discharge Precautions/Restrictions  Precautions Precautions: Fall Restrictions Weight Bearing Restrictions: No General   Vital Signs Therapy Vitals Pulse Rate: 78 Resp: 16 BP: (!) 116/52 Patient Position (if appropriate): Lying Oxygen Therapy SpO2: 97 % O2 Device: Room Air Pain Pain Assessment Pain Scale: 0-10 Pain Score: 5  Pain Type: Acute pain Pain Location: Neck Pain Orientation: Left Pain Descriptors / Indicators: Aching Pain Frequency: Constant Pain Onset: On-going Pain Intervention(s): Medication (See eMAR);Heat applied ADL ADL Grooming: Supervision/safety Where Assessed-Grooming: Sitting at sink Upper Body Bathing: Supervision/safety Where Assessed-Upper Body Bathing: Shower Lower Body Bathing: Supervision/safety Where Assessed-Lower Body Bathing: Shower Upper Body Dressing: Supervision/safety Where Assessed-Upper Body Dressing: Sitting at sink Lower Body Dressing: Supervision/safety Where Assessed-Lower Body Dressing: Sitting at sink Toileting: Supervision/safety Where Assessed-Toileting: Glass blower/designer: Close supervision Toilet Transfer Method: Counselling psychologist: Raised toilet Print production planner: Close supervision Social research officer, government Method: Radiographer, therapeutic: Civil engineer, contracting with back Vision Baseline Vision/History: Wears glasses Wears Glasses: Reading only Patient Visual Report: No change from baseline Vision Assessment?: Yes;No apparent visual deficits Perception  Perception: Within Functional Limits Praxis Praxis: Intact Cognition Overall Cognitive Status: Impaired/Different from baseline Arousal/Alertness: Awake/alert Orientation Level: Oriented X4 Attention: Sustained Sustained Attention: Appears intact Awareness: Impaired Awareness Impairment: Intellectual impairment Problem Solving: Impaired Sensation Sensation Light  Touch: Appears Intact Proprioception: Appears Intact Coordination Gross Motor Movements are Fluid and Coordinated: Yes Fine Motor Movements are Fluid and Coordinated:  Yes Motor  Motor Motor: Abnormal postural alignment and control Mobility  Bed Mobility Rolling Right: Supervision/verbal cueing Supine to Sit: Supervision/Verbal cueing Sit to Supine: Supervision/Verbal cueing Transfers Sit to Stand: Supervision/Verbal cueing Stand to Sit: Supervision/Verbal cueing  Trunk/Postural Assessment  Cervical Assessment Cervical Assessment: Exceptions to WFL(head forward) Thoracic Assessment Thoracic Assessment: Exceptions to WFL(rounded shoudlers) Lumbar Assessment Lumbar Assessment: Exceptions to WFL(post pelvic tilt) Postural Control Postural Control: Within Functional Limits  Balance Static Sitting Balance Static Sitting - Level of Assistance: 5: Stand by assistance Dynamic Sitting Balance Dynamic Sitting - Level of Assistance: 5: Stand by assistance Dynamic Standing Balance Dynamic Standing - Level of Assistance: 5: Stand by assistance Extremity/Trunk Assessment RUE Assessment RUE Assessment: Exceptions to [facility] General Strength Comments: generalized weakenss but improved since eval LUE Assessment LUE Assessment: Exceptions to [facility] General Strength Comments: generalized weakness/pain guarding d/t neck/shoudler pain impacting functional use   Tonny Branch 08/09/2019, 10:49 AM

## 2019-08-09 NOTE — Progress Notes (Signed)
Waiting on pt to return to room from therapy

## 2019-08-09 NOTE — Progress Notes (Signed)
Social Work Discharge Note   The overall goal for the admission was met for:   Discharge location: Yes - pt to return home with daughter and her spouse/ family to provide 24/7 support  Length of Stay: Yes - 8 days (with discharge on 8/29)  Discharge activity level: Yes - supervision  Home/community participation: Yes  Services provided included: MD, RD, PT, OT, SLP, RN, TR, Pharmacy and [employer]: [facility] Medicare  Follow-up services arranged: Home Health: RN, PT, OT, ST via [facility] and Patient/Family request agency HH: [facility], DME: NA  Comments (or additional information):      Contact info:: daughter, Dorice Lamas @ [phone]  Patient/Family verbalized understanding of follow-up arrangements: Yes  Individual responsible for coordination of the follow-up plan: pt/ daughter  Confirmed correct DME delivered:  NA - had all needed DME already    Rhonda Hale

## 2019-08-09 NOTE — Progress Notes (Signed)
[facility] PHYSICAL MEDICINE & REHABILITATION PROGRESS NOTE   Subjective/Complaints: Had a better night. Still fatigues easily with activity. Received kpad which helped neck  ROS: Patient denies fever, rash, sore throat, blurred vision, nausea, vomiting, diarrhea, cough, shortness of breath or chest pain,   headache, or mood change.   Objective:   No results found. Recent Labs    08/08/19 0525 08/09/19 0424  WBC 11.9* 11.5*  HGB 7.0* 7.1*  HCT 22.1* 21.9*  PLT 261 260   Recent Labs    08/08/19 0502  NA 140  K 3.7  CL 106  CO2 25  GLUCOSE 106*  BUN 12  CREATININE 0.85  CALCIUM 9.0    Intake/Output Summary (Last 24 hours) at 08/09/2019 1036 Last data filed at 08/09/2019 0900 Gross per 24 hour  Intake 700 ml  Output -  Net 700 ml     Physical Exam: Vital Signs Blood pressure (!) 116/52, pulse 78, temperature 99.2 F (37.3 C), temperature source Oral, resp. rate 16, height 5\' 2"  (1.575 m), weight 73.2 kg, SpO2 97 %.  Constitutional: No distress . Vital signs reviewed. HEENT: EOMI, oral membranes moist Neck: supple Cardiovascular: RRR without murmur. No JVD    Respiratory: CTA Bilaterally without wheezes or rales. Normal effort    GI: BS +, non-tender, non-distended  Musculoskeletal:Normal range of motion. Mild left cervical TTP General: No edema.  Neurological: She isalert. Fair insight and awareness. Mild STM deficits.   UE 4/5. LE 4/5 prox to distal. No focal sensory findings Skin: Skin iswarmand dry. She isnot diaphoretic.  Psychiatric: pleasant   Assessment/Plan: 1. Functional deficits secondary to encephalopathy which require 3+ hours per day of interdisciplinary therapy in a comprehensive inpatient rehab setting.  Physiatrist is providing close team supervision and 24 hour management of active medical problems listed below.  Physiatrist and rehab team continue to assess barriers to discharge/monitor patient progress toward functional and  medical goals  Care Tool:  Bathing  Bathing activity did not occur: Refused Body parts bathed by patient: Buttocks, Front perineal area         Bathing assist Assist Level: Minimal Assistance - Patient > 75%     Upper Body Dressing/Undressing Upper body dressing   What is the patient wearing?: Pull over shirt    Upper body assist Assist Level: Supervision/Verbal cueing    Lower Body Dressing/Undressing Lower body dressing      What is the patient wearing?: Pants, Incontinence brief     Lower body assist Assist for lower body dressing: Minimal Assistance - Patient > 75%     Toileting Toileting    Toileting assist Assist for toileting: Minimal Assistance - Patient > 75%     Transfers Chair/bed transfer  Transfers assist     Chair/bed transfer assist level: Supervision/Verbal cueing     Locomotion Ambulation   Ambulation assist      Assist level: Supervision/Verbal cueing Assistive device: Walker-rolling Max distance: 100   Walk 10 feet activity   Assist     Assist level: Supervision/Verbal cueing Assistive device: Walker-rolling   Walk 50 feet activity   Assist Walk 50 feet with 2 turns activity did not occur: Safety/medical concerns  Assist level: Supervision/Verbal cueing Assistive device: Walker-rolling    Walk 150 feet activity   Assist Walk 150 feet activity did not occur: Safety/medical concerns  Assist level: Contact Guard/Touching assist Assistive device: Walker-rolling    Walk 10 feet on uneven surface  activity   Assist Walk 10 feet  on uneven surfaces activity did not occur: Safety/medical concerns         Wheelchair     Assist Will patient use wheelchair at discharge?: No(TBD - mainly for transport)      Wheelchair assist level: Supervision/Verbal cueing Max wheelchair distance: 30 ft    Wheelchair 50 feet with 2 turns activity    Assist        Assist Level: Dependent - Patient 0%    Wheelchair 150 feet activity     Assist          Blood pressure (!) 116/52, pulse 78, temperature 99.2 F (37.3 C), temperature source Oral, resp. rate 16, height 5\' 2"  (1.575 m), weight 73.2 kg, SpO2 97 %.  Medical Problem List and Plan: 1.Decreased functional mobilitysecondary to acute toxic metabolic encephalopathy --Continue CIR therapies including PT, OT, and SLP    -ELOS 8/29  -Patient to see Rehab MD/provider in the office for transitional care encounter in 1-2 weeks.  2. Antithrombotics: -DVT/anticoagulation:Eliquis for history of pulmonary emboli -antiplatelet therapy: N/A 3. Pain Management:cervical discomfort improving  Tylenol as needed  -  kpad for neck--request again  -muscle rub PRN   -reviewed posture, etc 4. Mood:Seroquel 25 mg nightly -antipsychotic agents: N/A 5. Neuropsych: This patientis notyetcapable of making decisions on herown behalf. -improving cognition 6. Skin/Wound Care:Routine skin checks 7. Fluids/Electrolytes/Nutrition:encourage PO  - replete K+--> 3.7 today     8. History of metastatic ovarian cancer with chemotherapy. Follow-up oncology services 9. Acute on chronic renal insufficiency. Baseline creatinine 1.1-1.3 -encourage fluids -Cr. 0.85 10. Bone mineral metabolism. Followed by endocrinology in [place] 11. Hypertension. Norvasc 10 mg daily, labetalol 50 mg twice daily.   -bp under reasonable control 8/26 12. Hypothyroidism. Synthroid  13. Anemia: likely ACD, hgb has been trending back down again over last week   -hgb holding at 7.1 today.   -fatigues easily  -counts have dropped previously as well  -stool OB negative  -transfuse 1u PRBC today, pt agrees  -iron levels ok, ferritin high, consistent with ACD     LOS: 7 days A FACE TO FACE EVALUATION WAS PERFORMED  Meredith Staggers 08/09/2019, 10:36 AM

## 2019-08-09 NOTE — Progress Notes (Signed)
Daughter called, informed that collected digital rectal stool sample at bedside this am with negative results, Hbg this am is 7.1 and per Dr Naaman Plummer may administer one unit of blood prior to discharge tomorrow.  Port was de-accessed this am and pt requesting numbing gel so can re access. She will bring later today. Informed that is fine, will not affect therapy.

## 2019-08-09 NOTE — Progress Notes (Signed)
Physical Therapy Discharge Summary  Patient Details  Name: Rhonda Hale MRN: [id number] Date of Birth: 1946/08/13  Today's Date: 08/09/2019 PT Individual Time: 1419-1530 PT Individual Time Calculation (min): 71 min    Patient has met 6 of 7 long term goals due to improved activity tolerance, improved balance, improved postural control, increased strength, ability to compensate for deficits, improved awareness and improved coordination.  Patient to discharge at an ambulatory level supervision with RW.   Patient's care partner is independent to provide the necessary physical and cognitive assistance at discharge.  Reasons goals not met: pt's awareness re: physical & cognitive deficits are impaired   Recommendation:  Patient will benefit from ongoing skilled PT services in home health setting to continue to advance safe functional mobility, address ongoing impairments in balance, endurance, gait with LRAD, cognitive remediation, and minimize fall risk.  Equipment: None (pt already has RW & w/c)  Reasons for discharge: discharge from hospital  Patient/family agrees with progress made and goals achieved: Yes  Skilled PT Treatment: Pt received in bed & agreeable to tx, denying neck pain at beginning of session but does c/o intermittent L shoulder joint pain that comes & goes during session. Pt completes bed mobility with supervision without bed rails & bed flat. Pt transfers sit<>stand with supervision and ambulates room>apartment with RW & supervision with increased gait speed requiring seated rest break after 2/2 fatigue & SOB with therapist providing cuing for pursed lip breathing. Pt completes transfer off low, soft couch with supervision and car transfer at small van seat height with RW & supervision. Pt negotiates ramp & mulch with RW & supervision. Pt negotiates 12 steps (6" + 3") with 1 rail and supervision with pt reporting BLE weakness during task. Pt completed Berg Balance Test scoring  [phone]; educated pt on interpretation of score & current fall risk. Patient demonstrates increased fall risk as noted by score of   /56 on Berg Balance Scale.  (<36= high risk for falls, close to 100%; 37-45 significant >80%; 46-51 moderate >50%; 52-55 lower >25%). Patient demonstrates increased fall risk as noted by score of 38/56 on Berg Balance Scale.  (<36= high risk for falls, close to 100%; 37-45 significant >80%; 46-51 moderate >50%; 52-55 lower >25%). Pt ambulates gym>room with RW & supervision and into room/bathroom in same manner. Pt performs 3/3 toileting steps with continent void & BM, performing toilet transfer with distant supervision. Pt stands to perform hand hygiene with distant supervision. From EOB pt performs 5x sit<>stand x 2 without BUE support with supervision with task focusing on BLE strengthening & overall endurance training. At end of session pt left in bed with alarm set & all needs in reach.    Pt requires seated rest breaks throughout session 2/2 fatigue & decreased endurance.   Pt denies questions, comments, concerns re: d/c home tomorrow.   PT Discharge Precautions/Restrictions Precautions Precautions: Fall Restrictions Weight Bearing Restrictions: No  Vision/Perception  Pt wears glasses for reading only at baseline. Pt denies changes in baseline vision. Perception Perception: Within Functional Limits Praxis Praxis: Intact   Cognition Overall Cognitive Status: Impaired/Different from baseline Arousal/Alertness: Awake/alert Orientation Level: Oriented to person;Oriented to place;Oriented to time;Oriented to situation(oriented to month & year only (not oriented to date)) Memory: Impaired Memory Impairment: Decreased short term memory;Decreased recall of new information Awareness: Impaired Awareness Impairment: Intellectual impairment Problem Solving: Impaired Behaviors: Impulsive Safety/Judgment: Impaired   Sensation Sensation Light Touch: Appears  Intact(BLE) Proprioception: Appears Intact(BLE) Additional Comments: pt reports neuropathy in  B hands & B feet, but reports it's not any worse than baseline Coordination Gross Motor Movements are Fluid and Coordinated: No Fine Motor Movements are Fluid and Coordinated: Yes Heel Shin Test: decreased coordination BLE   Motor  Motor Motor: Abnormal postural alignment and control Motor - Discharge Observations: generalized deconditioning   Mobility Bed Mobility Bed Mobility: Rolling Right;Supine to Sit;Sit to Supine;Rolling Left Rolling Right: Supervision/verbal cueing Rolling Left: Supervision/Verbal cueing Supine to Sit: Supervision/Verbal cueing Sit to Supine: Supervision/Verbal cueing Transfers Transfers: Stand to Sit;Sit to Stand Sit to Stand: Supervision/Verbal cueing Stand to Sit: Supervision/Verbal cueing Stand Pivot Transfers: Supervision/Verbal cueing Stand Pivot Transfer Details: Verbal cues for precautions/safety Transfer (Assistive device): Rolling walker   Locomotion  Gait Ambulation: Yes Gait Assistance: Supervision/Verbal cueing Gait Distance (Feet): 150 Feet Assistive device: Rolling walker Gait Gait: Yes Gait Pattern: Impaired(forward trunk lean) Gait velocity: increased Stairs / Additional Locomotion Stairs: Yes Stairs Assistance: Supervision/Verbal cueing Stair Management Technique: (1 rail) Number of Stairs: 12 Height of Stairs: (6" + 3") Ramp: Supervision/Verbal cueing(ambulatory with RW)   Trunk/Postural Assessment  Cervical Assessment Cervical Assessment: Exceptions to WFL(forward head) Thoracic Assessment Thoracic Assessment: Exceptions to WFL(rounded shoulders) Lumbar Assessment Lumbar Assessment: Exceptions to WFL(posterior pelvic tilt) Postural Control Postural Control:  Protective responses & righting reactions delayed.  Balance Balance Balance Assessed: Yes Standardized Balance Assessment Standardized Balance Assessment: Berg  Balance Test Berg Balance Test Sit to Stand: Able to stand without using hands and stabilize independently Standing Unsupported: Able to stand 2 minutes with supervision Sitting with Back Unsupported but Feet Supported on Floor or Stool: Able to sit safely and securely 2 minutes Stand to Sit: Sits safely with minimal use of hands Transfers: Able to transfer safely, definite need of hands Standing Unsupported with Eyes Closed: Able to stand 10 seconds with supervision Standing Ubsupported with Feet Together: Able to place feet together independently and stand for 1 minute with supervision From Standing, Reach Forward with Outstretched Arm: Can reach forward >5 cm safely (2") From Standing Position, Pick up Object from Floor: Able to pick up shoe, needs supervision From Standing Position, Turn to Look Behind Over each Shoulder: Looks behind from both sides and weight shifts well Turn 360 Degrees: Needs close supervision or verbal cueing Standing Unsupported, Alternately Place Feet on Step/Stool: Able to complete 4 steps without aid or supervision Standing Unsupported, One Foot in Front: Able to take small step independently and hold 30 seconds Standing on One Leg: Unable to try or needs assist to prevent fall(able to lift foot for ~3 seconds but then requires assistance to prevent fall) Total Score: 38  Static Sitting Balance Static Sitting - Level of Assistance: 5: Stand by assistance  Dynamic Sitting Balance Dynamic Sitting - Level of Assistance: 5: Stand by assistance(significant posterior lean when performing heel to shin coordination task sitting EOB without BUE/BLE support)  Extremity Assessment  All extremities WFL. No formal manual muscle testing. Pt able to weight bear through BLE without buckling noted.    Waunita Schooner 08/09/2019, 3:31 PM

## 2019-08-09 NOTE — Progress Notes (Signed)
PAC due for reaccess today. Pt requested needle removal with no reaccess if IV medications not ordered. Currently has on prn medications that have not been needed recently. Site deaccessed with heparin lock per protocol.

## 2019-08-10 ENCOUNTER — Other Ambulatory Visit (HOSPITAL_COMMUNITY): Payer: Self-pay | Admitting: Physical Medicine & Rehabilitation

## 2019-08-10 LAB — TYPE AND SCREEN
ABO/RH(D): A POS
Antibody Screen: NEGATIVE
Unit division: 0

## 2019-08-10 LAB — BPAM RBC
Blood Product Expiration Date: 202009192359
ISSUE DATE / TIME: 202008281535
Unit Type and Rh: 6200

## 2019-08-10 MED ORDER — QUETIAPINE FUMARATE 25 MG PO TABS: 25.0000 mg | ORAL_TABLET | Freq: Every day | ORAL | 2 refills | Status: DC

## 2019-08-10 MED ORDER — APIXABAN 5 MG PO TABS: 5.0000 mg | ORAL_TABLET | Freq: Two times a day (BID) | ORAL | 3 refills | Status: DC

## 2019-08-10 NOTE — Progress Notes (Signed)
Patient discharged home with family, questions/concerns addressed by MD prior to discharge. Escorted to car by NT with all belongings

## 2019-08-10 NOTE — Discharge Instructions (Signed)
Inpatient Rehab Discharge Instructions  Rhonda Hale Discharge date and time: No discharge date for patient encounter.   Activities/Precautions/ Functional Status: Activity: activity as tolerated Diet: regular diet Wound Care: none needed Functional status:  ___ No restrictions     ___ Walk up steps independently ___ 24/7 supervision/assistance   ___ Walk up steps with assistance ___ Intermittent supervision/assistance  ___ Bathe/dress independently ___ Walk with walker     _x__ Bathe/dress with assistance ___ Walk Independently    ___ Shower independently ___ Walk with assistance    ___ Shower with assistance ___ No alcohol     ___ Return to work/school ________    COMMUNITY REFERRALS UPON DISCHARGE:    Home Health:   RN     PT     OT     ST                        Agency:  [facility]    Phone: [phone]        Special Instructions: No driving smoking or alcohol   My questions have been answered and I understand these instructions. I will adhere to these goals and the provided educational materials after my discharge from the hospital.  Patient/Caregiver Signature _______________________________ Date __________  Clinician Signature _______________________________________ Date __________  Please bring this form and your medication list with you to all your follow-up doctor's appointments.   Information on my medicine - ELIQUIS (apixaban)  This medication education was reviewed with me or my healthcare representative as part of my discharge preparation.  Why was Eliquis prescribed for you? Eliquis was prescribed to treat blood clots that may have been found in the veins of your legs (deep vein thrombosis) or in your lungs (pulmonary embolism) and to reduce the risk of them occurring again.  What do You need to know about Eliquis ? The dose is a 5 mg tablet taken TWICE daily.  Eliquis may be taken with or without food.   Try to take the dose about the  same time in the morning and in the evening. If you have difficulty swallowing the tablet whole please discuss with your pharmacist how to take the medication safely.  Take Eliquis exactly as prescribed and DO NOT stop taking Eliquis without talking to the doctor who prescribed the medication.  Stopping may increase your risk of developing a new blood clot.  Refill your prescription before you run out.  After discharge, you should have regular check-up appointments with your healthcare provider that is prescribing your Eliquis.    What do you do if you miss a dose? If a dose of ELIQUIS is not taken at the scheduled time, take it as soon as possible on the same day and twice-daily administration should be resumed. The dose should not be doubled to make up for a missed dose.  Important Safety Information A possible side effect of Eliquis is bleeding. You should call your healthcare provider right away if you experience any of the following: ? Bleeding from an injury or your nose that does not stop. ? Unusual colored urine (red or dark brown) or unusual colored stools (red or black). ? Unusual bruising for unknown reasons. ? A serious fall or if you hit your head (even if there is no bleeding).  Some medicines may interact with Eliquis and might increase your risk of bleeding or clotting while on Eliquis. To help avoid this, consult your healthcare provider or pharmacist prior  to using any new prescription or non-prescription medications, including herbals, vitamins, non-steroidal anti-inflammatory drugs (NSAIDs) and supplements.  This website has more information on Eliquis (apixaban): http://www.eliquis.com/eliquis/home

## 2019-08-10 NOTE — Progress Notes (Signed)
[place] PHYSICAL MEDICINE & REHABILITATION PROGRESS NOTE   Subjective/Complaints: No new complaints. Slept well. No problems with blood trfxn  ROS: Patient denies fever, rash, sore throat, blurred vision, nausea, vomiting, diarrhea, cough, shortness of breath or chest pain, joint or back pain, headache, or mood change.    Objective:   No results found. Recent Labs    08/08/19 0525 08/09/19 0424  WBC 11.9* 11.5*  HGB 7.0* 7.1*  HCT 22.1* 21.9*  PLT 261 260   Recent Labs    08/08/19 0502  NA 140  K 3.7  CL 106  CO2 25  GLUCOSE 106*  BUN 12  CREATININE 0.85  CALCIUM 9.0    Intake/Output Summary (Last 24 hours) at 08/10/2019 0839 Last data filed at 08/10/2019 0826 Gross per 24 hour  Intake 1476.5 ml  Output -  Net 1476.5 ml     Physical Exam: Vital Signs Blood pressure (!) 106/49, pulse 77, temperature 99.1 F (37.3 C), temperature source Oral, resp. rate 16, height _0  (1.575 m), weight 73.2 kg, SpO2 96 %.  Constitutional: No distress . Vital signs reviewed. HEENT: EOMI, oral membranes moist Neck: supple Cardiovascular: RRR without murmur. No JVD    Respiratory: CTA Bilaterally without wheezes or rales. Normal effort    GI: BS +, non-tender, non-distended  Musculoskeletal:Normal range of motion. Mild left cervical TTP General: No edema.  Neurological: She isalert. Fair insight and awareness. Mild STM deficits.   UE 4/5. LE 4/5 prox to distal. No focal sensory findings Skin: Skin iswarmand dry. She isnot diaphoretic.  Psychiatric: pleasant   Assessment/Plan: 1. Functional deficits secondary to encephalopathy which require 3+ hours per day of interdisciplinary therapy in a comprehensive inpatient rehab setting.  Physiatrist is providing close team supervision and 24 hour management of active medical problems listed below.  Physiatrist and rehab team continue to assess barriers to discharge/monitor patient progress toward functional and  medical goals  Care Tool:  Bathing  Bathing activity did not occur: Refused Body parts bathed by patient: Right arm, Left arm, Chest, Abdomen, Front perineal area, Buttocks, Right upper leg, Left upper leg, Right lower leg, Left lower leg, Face         Bathing assist Assist Level: Set up assist     Upper Body Dressing/Undressing Upper body dressing   What is the patient wearing?: Pull over shirt    Upper body assist Assist Level: Set up assist    Lower Body Dressing/Undressing Lower body dressing      What is the patient wearing?: Pants, Incontinence brief     Lower body assist Assist for lower body dressing: Supervision/Verbal cueing     Toileting Toileting    Toileting assist Assist for toileting: Supervision/Verbal cueing     Transfers Chair/bed transfer  Transfers assist     Chair/bed transfer assist level: Supervision/Verbal cueing     Locomotion Ambulation   Ambulation assist      Assist level: Supervision/Verbal cueing Assistive device: Walker-rolling Max distance: 150 ft   Walk 10 feet activity   Assist     Assist level: Supervision/Verbal cueing Assistive device: Walker-rolling   Walk 50 feet activity   Assist Walk 50 feet with 2 turns activity did not occur: Safety/medical concerns  Assist level: Supervision/Verbal cueing Assistive device: Walker-rolling    Walk 150 feet activity   Assist Walk 150 feet activity did not occur: Safety/medical concerns  Assist level: Supervision/Verbal cueing Assistive device: Walker-rolling    Walk 10 feet on uneven surface  activity   Assist Walk 10 feet on uneven surfaces activity did not occur: Safety/medical concerns   Assist level: Supervision/Verbal cueing Assistive device: Aeronautical engineer Will patient use wheelchair at discharge?: No   Wheelchair activity did not occur: N/A(pt to d/c at ambulatory level with supervision & RW)  Wheelchair assist  level: Supervision/Verbal cueing Max wheelchair distance: 30 ft    Wheelchair 50 feet with 2 turns activity    Assist    Wheelchair 50 feet with 2 turns activity did not occur: N/A   Assist Level: Dependent - Patient 0%   Wheelchair 150 feet activity     Assist  Wheelchair 150 feet activity did not occur: N/A       Blood pressure (!) 106/49, pulse 77, temperature 99.1 F (37.3 C), temperature source Oral, resp. rate 16, height _0  (1.575 m), weight 73.2 kg, SpO2 96 %.  Medical Problem List and Plan: 1.Decreased functional mobilitysecondary to acute toxic metabolic encephalopathy -DC home today. Goals met  -Patient to see Rehab MD/provider in the office for transitional care encounter in 1-2 weeks.  2. Antithrombotics: -DVT/anticoagulation:Eliquis for history of pulmonary emboli -antiplatelet therapy: N/A 3. Pain Management:cervical discomfort improving  Tylenol as needed  -  kpad for neck   -muscle rub PRN   -have reviewed posture, etc 4. Mood:Seroquel 25 mg nightly -antipsychotic agents: N/A 5. Neuropsych: This patientis notyetcapable of making decisions on herown behalf. -improving cognition 6. Skin/Wound Care:Routine skin checks 7. Fluids/Electrolytes/Nutrition:encourage PO  - replete K+--> 3.7      8. History of metastatic ovarian cancer with chemotherapy. Follow-up oncology services 9. Acute on chronic renal insufficiency. Baseline creatinine 1.1-1.3 -encourage fluids -Cr. 0.85 10. Bone mineral metabolism. Followed by endocrinology in [place] 11. Hypertension. Norvasc 10 mg daily, labetalol 50 mg twice daily.   -bp under reasonable control 8/26 12. Hypothyroidism. Synthroid  13. Symptomatic Anemia:   ACD, hgb has been trending back down again over last week   -hgb holding at 7.1 .   -fatigues easily  -stool OB  negative  -transfused 1u PRBC 8/28  -iron levels ok, ferritin high, consistent with ACD  -recheck cbc next week by [facility]     LOS: 8 days A FACE TO [place] 08/10/2019, 8:39 AM

## 2019-08-12 LAB — FOLATE RBC
Folate, Hemolysate: 321 ng/mL
Folate, RBC: 1414 ng/mL (ref >498)
Hematocrit: 22.7 % — ABNORMAL LOW (ref 34.0–46.6)

## 2019-08-12 LAB — FUNGAL ORGANISM REFLEX

## 2019-08-12 LAB — FUNGUS CULTURE WITH STAIN

## 2019-08-12 LAB — FUNGUS CULTURE RESULT

## 2019-08-13 ENCOUNTER — Telehealth: Payer: Self-pay

## 2019-08-13 ENCOUNTER — Other Ambulatory Visit: Payer: Self-pay | Admitting: Internal Medicine

## 2019-08-13 DIAGNOSIS — E2740 Unspecified adrenocortical insufficiency: Secondary | ICD-10-CM

## 2019-08-13 LAB — MISC LABCORP TEST (SEND OUT): LabCorp test name: 9985

## 2019-08-13 NOTE — Progress Notes (Signed)
Patient had abnormal cosyntropin stim test while in CIR which was consistent with adrenal insufficiency.  She was not started on treatment because there was concern this was not accurate due to recent stress dose steroids.  Needs repeat Stim testing however now has been discharged from CIR.  I called and discussed this with her daughter. Will need short stay stim test.  -Cosyntropin stim test -Check ACTH with basal sample. -Also obtain CBC to follow up anemia.

## 2019-08-13 NOTE — Telephone Encounter (Signed)
Xarelto 20mg  daily from my standpoint is perfectly fine, I dont think this was changed by our team

## 2019-08-13 NOTE — Telephone Encounter (Signed)
  [place]  Patient Name: Rhonda Hale DOB: 11-22-1946 Appointment Date and Time: 08-22-2019 / 1:40pm With: Dr. Dagoberto Ligas  Questions for our staff to ask patients on Transitional care 48 hour phone call:   1. Are you/is patient experiencing any problems since coming home? Are there any questions regarding any aspect of care? NO  2. Are there any questions regarding medications administration/dosing? NO  Are meds being taken as prescribed? YES  Patient should review meds with caller to confirm   3. Have there been any falls? NO  4. Has Home Health been to the house and/or have they contacted you? If not, have you tried to contact them? Can we help you contact them? YES  5. Are bowels and bladder emptying properly? Are there any unexpected incontinence issues? If applicable, is patient following bowel/bladder programs? YES  6. Any fevers, problems with breathing, unexpected pain? NO  7. Are there any skin problems or new areas of breakdown? NO  8. Has the patient/family member arranged specialty MD follow up (ie cardiology/neurology/renal/surgical/etc)? Can we help arrange? YES  9. Does the patient need any other services or support that we can help arrange?  NO  10. Are caregivers following through as expected in assisting the patient?  YES  11. Has the patient quit smoking, drinking alcohol, or using drugs as recommended? NA  [facility] [address] [place] [phone] \

## 2019-08-13 NOTE — Telephone Encounter (Signed)
Preformed a Transitional Care Call [facility] follow up call) for this patient today.  Was mentioned during her call that she was prescribed eliquis medication as part of her discharge medication from the hospital.  Daughter stated that even with insurance the medication is over 150 dollars a month and they cannot afford it long term.  She mentioned that the patient used to be Xarelto prior to being admitted to hospital and is asking if she can be placed back onto it or anything else cheaper.

## 2019-08-15 ENCOUNTER — Ambulatory Visit: Payer: Medicare Other | Admitting: Infectious Disease

## 2019-08-16 ENCOUNTER — Telehealth: Payer: Self-pay | Admitting: *Deleted

## 2019-08-16 NOTE — Telephone Encounter (Signed)
Agree with VO, I do believe she plans to establish care with the [facility]

## 2019-08-16 NOTE — Telephone Encounter (Signed)
[facility] autumn calls for VO to move start of care per pt family to next week, given, do you agree? Also ask her to clarify if pt will be f/u in imc and is she wanting to establish care

## 2019-08-21 ENCOUNTER — Other Ambulatory Visit: Payer: Self-pay

## 2019-08-21 ENCOUNTER — Telehealth: Payer: Self-pay | Admitting: *Deleted

## 2019-08-21 ENCOUNTER — Ambulatory Visit (INDEPENDENT_AMBULATORY_CARE_PROVIDER_SITE_OTHER): Payer: Medicare Other | Admitting: Internal Medicine

## 2019-08-21 ENCOUNTER — Observation Stay (HOSPITAL_COMMUNITY): Admission: AD | Admit: 2019-08-21 | Payer: Medicare Other | Source: Ambulatory Visit | Admitting: Internal Medicine

## 2019-08-21 VITALS — BP 118/60 | HR 75 | Temp 98.4°F | Wt 163.6 lb

## 2019-08-21 DIAGNOSIS — Z8543 Personal history of malignant neoplasm of ovary: Secondary | ICD-10-CM

## 2019-08-21 DIAGNOSIS — G92 Toxic encephalopathy: Secondary | ICD-10-CM | POA: Diagnosis not present

## 2019-08-21 DIAGNOSIS — I959 Hypotension, unspecified: Secondary | ICD-10-CM | POA: Diagnosis not present

## 2019-08-21 DIAGNOSIS — Z9221 Personal history of antineoplastic chemotherapy: Secondary | ICD-10-CM

## 2019-08-21 DIAGNOSIS — J449 Chronic obstructive pulmonary disease, unspecified: Secondary | ICD-10-CM

## 2019-08-21 DIAGNOSIS — I10 Essential (primary) hypertension: Secondary | ICD-10-CM

## 2019-08-21 DIAGNOSIS — E876 Hypokalemia: Secondary | ICD-10-CM | POA: Diagnosis not present

## 2019-08-21 DIAGNOSIS — I951 Orthostatic hypotension: Secondary | ICD-10-CM | POA: Diagnosis not present

## 2019-08-21 DIAGNOSIS — N179 Acute kidney failure, unspecified: Secondary | ICD-10-CM

## 2019-08-21 DIAGNOSIS — Z79899 Other long term (current) drug therapy: Secondary | ICD-10-CM

## 2019-08-21 DIAGNOSIS — Z90710 Acquired absence of both cervix and uterus: Secondary | ICD-10-CM

## 2019-08-21 LAB — CBC WITH DIFFERENTIAL/PLATELET
Abs Immature Granulocytes: 0 10*3/uL (ref 0.00–0.07)
Basophils Absolute: 0.2 10*3/uL — ABNORMAL HIGH (ref 0.0–0.1)
Basophils Relative: 2 %
Eosinophils Absolute: 0.3 10*3/uL (ref 0.0–0.5)
Eosinophils Relative: 3 %
HCT: 30.6 % — ABNORMAL LOW (ref 36.0–46.0)
Hemoglobin: 9.7 g/dL — ABNORMAL LOW (ref 12.0–15.0)
Lymphocytes Relative: 31 %
Lymphs Abs: 3.4 10*3/uL (ref 0.7–4.0)
MCH: 31.5 pg (ref 26.0–34.0)
MCHC: 31.7 g/dL (ref 30.0–36.0)
MCV: 99.4 fL (ref 80.0–100.0)
Monocytes Absolute: 0.9 10*3/uL (ref 0.1–1.0)
Monocytes Relative: 8 %
Neutro Abs: 6.2 10*3/uL (ref 1.7–7.7)
Neutrophils Relative %: 56 %
Platelets: 572 10*3/uL — ABNORMAL HIGH (ref 150–400)
RBC: 3.08 MIL/uL — ABNORMAL LOW (ref 3.87–5.11)
RDW: 20.1 % — ABNORMAL HIGH (ref 11.5–15.5)
WBC: 11.1 10*3/uL — ABNORMAL HIGH (ref 4.0–10.5)
nRBC: 1.9 % — ABNORMAL HIGH (ref 0.0–0.2)
nRBC: 4 /100 WBC — ABNORMAL HIGH

## 2019-08-21 LAB — BASIC METABOLIC PANEL
Anion gap: 10 (ref 5–15)
BUN: 11 mg/dL (ref 8–23)
CO2: 23 mmol/L (ref 22–32)
Calcium: 9 mg/dL (ref 8.9–10.3)
Chloride: 107 mmol/L (ref 98–111)
Creatinine, Ser: 0.96 mg/dL (ref 0.44–1.00)
GFR calc Af Amer: >60 mL/min (ref >60)
GFR calc non Af Amer: 58 mL/min — ABNORMAL LOW (ref >60)
Glucose, Bld: 87 mg/dL (ref 70–99)
Potassium: 3 mmol/L — ABNORMAL LOW (ref 3.5–5.1)
Sodium: 140 mmol/L (ref 135–145)

## 2019-08-21 LAB — GLUCOSE, CAPILLARY: Glucose-Capillary: 78 mg/dL (ref 70–99)

## 2019-08-21 MED ORDER — SODIUM CHLORIDE 0.9 % IV BOLUS
1000.0000 mL | Freq: Once | INTRAVENOUS | Status: AC
  Administered 2019-08-21: 1000 mL via INTRAVENOUS

## 2019-08-21 NOTE — Progress Notes (Signed)
Patient seen in [facility] today. Hubbard Hartshorn, RN, BSN

## 2019-08-21 NOTE — Assessment & Plan Note (Addendum)
Patient presents for hospital follow up post being admitted 8/14-21 for 1) aki thought to be secondary to decreased oral intake and bactrim toxicity, 2) acute encephalopathy 2/2 for unknown reason but presumed possible paraneoplastic syndrome for which she was started on high dose methylprednisolone for 2 day duration. She was also placed on Seroquel, 3) hyponatremia vs SIADH. Patient was discharged to CIR (8/21-8/29).   The patient was hypotensive 93/53 during today's visit. Orthostatic positive. Random blood glucose 78. Patient states that she has not had anything to eat this morning, but has been eating regularly. She ate steak for dinner last night. She denies any nausea, vomiting, abdominal pain, changes in stool color or consistency. She states that she is dizzy.   Assessment and plan  STAT BMP and CBC were obtained which did not show any acute changes. Patient had hypokalemia of 3.0 for which increased oral intake was recommended. Patient was given 1L bolus of NS. Patient to follow up in 2 weeks to re-assess. Will also discuss cosyntropin test at that time.   -decreased amlodipine from 10mg  qd to 5mg  qd -Will do telephone follow up on Friday 9/11

## 2019-08-21 NOTE — Telephone Encounter (Signed)
Returned call to [place], OT with [facility]. He is requesting VO for 1 week 1, 2 week 2, and 1 week 2. Unfortunately, PCP is listed as Dr. Genevive Bi. Practice Administrator will confirm if this is accurate. Hubbard Hartshorn, RN, BSN

## 2019-08-21 NOTE — Progress Notes (Signed)
   CC: Toxic acute encephalopathy hospital follow up   HPI:  [name] is a 73 y.o. with copd, hypertension, metastatic ovarian cancer s/p hysterectomy and chemotherapy who presents for hospital follow up. Please see problem based charting for evaluation, assessment, and plan.  Past Medical History:  Diagnosis Date  . Cancer ([place])   . COPD (chronic obstructive pulmonary disease) ([place])   . Hypertension    Review of Systems:    Review of Systems  Constitutional: Positive for malaise/fatigue. Negative for chills, fever and weight loss.  Respiratory: Negative for cough and shortness of breath.   Cardiovascular: Negative for chest pain.  Gastrointestinal: Negative for abdominal pain, nausea and vomiting.  Neurological: Positive for dizziness. Negative for headaches.   Physical Exam:  Vitals:   08/21/19 1033  BP: (!) 93/53  Pulse: 76  SpO2: 99%  Weight: 163 lb 9.6 oz (74.2 kg)   Physical Exam  Constitutional: Appears well-developed and well-nourished. No distress.  HENT:  Head: Normocephalic and atraumatic.  Eyes: Conjunctivae are normal.  Cardiovascular: Normal rate, regular rhythm and normal heart sounds.  Respiratory: Effort normal and breath sounds normal. No respiratory distress. No wheezes.  GI: Soft. Bowel sounds are normal. No distension. There is no tenderness.  Musculoskeletal: No edema.  Neurological: Is alert.  Skin: Not diaphoretic. No erythema.  Psychiatric: Normal mood and affect. Behavior is normal. Judgment and thought content normal.   Assessment & Plan:   See Encounters Tab for problem based charting.  Patient discussed with Dr. Dareen Piano

## 2019-08-21 NOTE — Telephone Encounter (Signed)
Rhonda Hale from [facility] needs VO [phone]

## 2019-08-21 NOTE — Patient Instructions (Signed)
It was a pleasure to see you today [name]. Please make the following changes:  -Please make sure you drink plenty of water and eat regular meals -you were given 1L of fluid during this visit  -your electrolytes and blood counts are stable -Follow up in 2 weeks   If you have any questions or concerns, please call our clinic at [phone] between 9am-5pm and after hours call [phone] and ask for the internal medicine resident on call. If you feel you are having a medical emergency please call 911.   Thank you, we look forward to help you remain healthy!  Lars Mage, MD Internal Medicine PGY3

## 2019-08-21 NOTE — Telephone Encounter (Signed)
Per Practice administrator patient is transferring care to [facility]. Will route request for [facility] OT VO to new PCP. Hubbard Hartshorn, RN, BSN

## 2019-08-22 ENCOUNTER — Encounter: Payer: Self-pay | Admitting: Physical Medicine and Rehabilitation

## 2019-08-22 ENCOUNTER — Other Ambulatory Visit: Payer: Self-pay

## 2019-08-22 ENCOUNTER — Encounter
Payer: Medicare Other | Attending: Physical Medicine and Rehabilitation | Admitting: Physical Medicine and Rehabilitation

## 2019-08-22 VITALS — BP 126/73 | HR 89 | Temp 98.5°F | Wt 167.4 lb

## 2019-08-22 DIAGNOSIS — M7918 Myalgia, other site: Secondary | ICD-10-CM | POA: Diagnosis not present

## 2019-08-22 DIAGNOSIS — C569 Malignant neoplasm of unspecified ovary: Secondary | ICD-10-CM | POA: Diagnosis not present

## 2019-08-22 NOTE — Progress Notes (Signed)
Internal Medicine Clinic Attending  Case discussed with Dr. Chundi at the time of the visit.  We reviewed the resident's history and exam and pertinent patient test results.  I agree with the assessment, diagnosis, and plan of care documented in the resident's note. 

## 2019-08-22 NOTE — Patient Instructions (Addendum)
  1. Trigger point injections- Injected a total of 3cc of 1% Lidocaine into B/L scalenes, L upper trap, L levator, L pec and L splenius capitus. Used 27 gauge 1.5i inch needle- pain was 8/10 prior- rates 3/10 and only feels it with twisting.Also notices an increased ROM in neck and shoulders.  2. Doesn't need any refills   3. Will call prn if needs a f/u.   4. Suggest getting a THERA CANE!- Can get online or medical supply store.   5. Drink a lot of water this evening.

## 2019-08-22 NOTE — Telephone Encounter (Signed)
Clair Gulling will fax orders to clinic

## 2019-08-22 NOTE — Telephone Encounter (Signed)
[facility] rtn call about HH VO.

## 2019-08-22 NOTE — Progress Notes (Signed)
Subjective:    Patient ID: Rhonda Hale, female    DOB: Jan 22, 1946, 73 y.o.   MRN: [id number] CC; L neck pain  HPI Pt is a 73 yr old R handed female with hx of metastatic ovarian CA s/p chemo -last time in July 2020, developed metabolic encephalopathy- has resolved; is seeing Oncologist Monday to see if can get PET scan, etc and see where they are; Here for hospital f/u- main issue is neck pain.   Pt fell end of June [id number]- sliced head open in June, and hit head, however this is really keeping her from being active. Keeps her from being 100%.  Tylenol helps keep it tolerable, but still really painful- decreases ROM of neck and shoulders.  Also tried heat and massage- helped short term and then came back.    Pain Inventory Average Pain 0 Pain Right Now 5 My pain is intermittent and dull  In the last 24 hours, has pain interfered with the following? General activity 3 Relation with others 0 Enjoyment of life 3 What TIME of day is your pain at its worst? morning and evening  Sleep (in general) Good  Pain is worse with: bending Pain improves with: rest, heat/ice, therapy/exercise, medication and TENS Relief from Meds: good  Mobility walk with assistance how many minutes can you walk? 1.5-2 ability to climb steps?  yes do you drive?  no transfers alone Do you have any goals in this area?  no  Function not employed: date last employed . retired Do you have any goals in this area?  no  Neuro/Psych bladder control problems dizziness  Prior Studies Any changes since last visit?  no  Physicians involved in your care Any changes since last visit?  no   No family history on file. Social History   Socioeconomic History  . Marital status: Divorced    Spouse name: Not on file  . Number of children: Not on file  . Years of education: Not on file  . Highest education level: Not on file  Occupational History  . Not on file  Social Needs  . Financial resource  strain: Not on file  . Food insecurity    Worry: Not on file    Inability: Not on file  . Transportation needs    Medical: Not on file    Non-medical: Not on file  Tobacco Use  . Smoking status: Former Smoker    Years: 20.00    Types: Cigarettes  . Smokeless tobacco: Never Used  Substance and Sexual Activity  . Alcohol use: Not Currently  . Drug use: Never  . Sexual activity: Not on file  Lifestyle  . Physical activity    Days per week: Not on file    Minutes per session: Not on file  . Stress: Not on file  Relationships  . Social Herbalist on phone: Not on file    Gets together: Not on file    Attends religious service: Not on file    Active member of club or organization: Not on file    Attends meetings of clubs or organizations: Not on file    Relationship status: Not on file  Other Topics Concern  . Not on file  Social History Narrative  . Not on file   Past Surgical History:  Procedure Laterality Date  . ABDOMINAL HYSTERECTOMY N/A   . CATARACT EXTRACTION, BILATERAL    . CHOLECYSTECTOMY    . HIP SURGERY Right   .  LEG SURGERY Left   . LUNG SURGERY    . SPLENECTOMY, TOTAL    . THYROIDECTOMY     Past Medical History:  Diagnosis Date  . Cancer ([place])   . COPD (chronic obstructive pulmonary disease) ([place])   . Hypertension    BP 126/73 (BP Location: Left Arm)   Pulse 89   Temp 98.5 F (36.9 C)   Wt 167 lb 6.4 oz (75.9 kg)   SpO2 92%   BMI 30.62 kg/m   Opioid Risk Score:   Fall Risk Score:  `1  Depression screen PHQ 2/9  Depression screen PHQ 2/9 07/24/2019  Decreased Interest 2  Down, Depressed, Hopeless 2  PHQ - 2 Score 4  Altered sleeping 0  Tired, decreased energy 1  Change in appetite 2  Feeling bad or failure about yourself  0  Trouble concentrating 1  Moving slowly or fidgety/restless 0  Suicidal thoughts 0  PHQ-9 Score 8  Difficult doing work/chores Somewhat difficult    Review of Systems  Constitutional: Negative.    HENT: Negative.   Eyes: Negative.   Respiratory: Positive for shortness of breath.   Cardiovascular: Negative.   Gastrointestinal: Negative.   Endocrine: Negative.   Genitourinary: Negative.   Musculoskeletal: Positive for joint swelling.  Skin: Negative.   Allergic/Immunologic: Negative.   Neurological: Positive for dizziness.  Hematological: Bruises/bleeds easily.  Psychiatric/Behavioral: Negative.   All other systems reviewed and are negative.      Objective:   Physical Exam  Awake, alert, appropriate, accompanied by daughter, NAD Trigger points middle and posterior scalenes L>R, upper traps B/L, splenius capitus L>R, levators L>R, and pecs and rhomboids L>R        Assessment & Plan:    1. Trigger point injections- Injected a total of 3cc of 1% Lidocaine into B/L scalenes, L upper trap, L levator, L pec and L splenius capitus. Used 27 gauge 1.5i inch needle- pain was 8/10 prior- rates 3/10 and only feels it with twisting.Also notices an increased ROM in neck and shoulders.  2. Doesn't need any refills   3. Will call prn if needs a f/u.

## 2019-08-23 ENCOUNTER — Telehealth: Payer: Self-pay | Admitting: Internal Medicine

## 2019-08-23 ENCOUNTER — Ambulatory Visit: Payer: Medicare Other | Admitting: Pharmacist

## 2019-08-23 NOTE — Progress Notes (Unsigned)
Medication Samples have been provided to the caregiver.  Drug name: Eliquis       Strength: 5 mg       Qty: 28 tablets  LOT: [id number] Exp.Date: 05/2021  Dosing instructions: Take 1 tablet BID   The patient has been instructed regarding the correct time, dose, and frequency of taking this medication, including desired effects and most common side effects.   Rhonda Hale 2:21 PM 08/23/2019

## 2019-08-23 NOTE — Telephone Encounter (Signed)
VO for speech therapy  1x week for 2 weeks 2x week for 2 weeks Do you agree?

## 2019-08-23 NOTE — Telephone Encounter (Signed)
[facility] needs VO [phone]

## 2019-08-23 NOTE — Telephone Encounter (Signed)
Attempted to call patient to re-check how her blood pressure is doing. Was unable to reach.   Lars Mage, MD Internal Medicine PGY3 [phone] 08/23/2019, 11:23 AM

## 2019-08-26 ENCOUNTER — Telehealth: Payer: Self-pay | Admitting: *Deleted

## 2019-08-26 NOTE — Telephone Encounter (Signed)
Thank you glenda ?

## 2019-08-26 NOTE — Telephone Encounter (Signed)
Order for ACTH Stimulation Test faxed to [facility] Short Stay. Appt scheduled on 9/21 @ 1000 AM. Called pt's daughter, Rhonda Hale - informed of appt ; instructed to register at Admissions 10 - 15 mins prior to test.

## 2019-09-02 ENCOUNTER — Ambulatory Visit (HOSPITAL_COMMUNITY)
Admission: RE | Admit: 2019-09-02 | Discharge: 2019-09-02 | Disposition: A | Discharge status: Home / Self Care | Payer: Medicare Other | Source: Ambulatory Visit | Attending: Internal Medicine | Admitting: Internal Medicine

## 2019-09-02 ENCOUNTER — Other Ambulatory Visit: Payer: Self-pay

## 2019-09-02 DIAGNOSIS — E2740 Unspecified adrenocortical insufficiency: Secondary | ICD-10-CM | POA: Insufficient documentation

## 2019-09-02 LAB — CBC WITH DIFFERENTIAL/PLATELET
Abs Immature Granulocytes: 0.16 10*3/uL — ABNORMAL HIGH (ref 0.00–0.07)
Basophils Absolute: 0.1 10*3/uL (ref 0.0–0.1)
Basophils Relative: 1 %
Eosinophils Absolute: 0.2 10*3/uL (ref 0.0–0.5)
Eosinophils Relative: 2 %
HCT: 28.6 % — ABNORMAL LOW (ref 36.0–46.0)
Hemoglobin: 9 g/dL — ABNORMAL LOW (ref 12.0–15.0)
Immature Granulocytes: 1 %
Lymphocytes Relative: 23 %
Lymphs Abs: 3.1 10*3/uL (ref 0.7–4.0)
MCH: 31.9 pg (ref 26.0–34.0)
MCHC: 31.5 g/dL (ref 30.0–36.0)
MCV: 101.4 fL — ABNORMAL HIGH (ref 80.0–100.0)
Monocytes Absolute: 1.9 10*3/uL — ABNORMAL HIGH (ref 0.1–1.0)
Monocytes Relative: 14 %
Neutro Abs: 8.1 10*3/uL — ABNORMAL HIGH (ref 1.7–7.7)
Neutrophils Relative %: 59 %
Platelets: 444 10*3/uL — ABNORMAL HIGH (ref 150–400)
RBC: 2.82 MIL/uL — ABNORMAL LOW (ref 3.87–5.11)
RDW: 19.6 % — ABNORMAL HIGH (ref 11.5–15.5)
WBC: 13.5 10*3/uL — ABNORMAL HIGH (ref 4.0–10.5)
nRBC: 0.3 % — ABNORMAL HIGH (ref 0.0–0.2)

## 2019-09-02 LAB — ACTH STIMULATION, 3 TIME POINTS
Cortisol, 30 Min: 12.4 ug/dL
Cortisol, 60 Min: 15.3 ug/dL
Cortisol, Base: 6.1 ug/dL

## 2019-09-02 MED ORDER — HEPARIN SOD (PORK) LOCK FLUSH 100 UNIT/ML IV SOLN: 500.0000 [IU] | INTRAVENOUS | Status: DC

## 2019-09-02 MED ORDER — COSYNTROPIN 0.25 MG IJ SOLR
0.2500 mg | Freq: Once | INTRAMUSCULAR | Status: AC
  Administered 2019-09-02: 10:00:00 0.25 mg via INTRAVENOUS

## 2019-09-02 MED ORDER — COSYNTROPIN 0.25 MG IJ SOLR
INTRAMUSCULAR | Status: AC
  Administered 2019-09-02: 0.25 mg via INTRAVENOUS
  Filled 2019-09-02: qty 0.25

## 2019-09-02 MED ORDER — HEPARIN SOD (PORK) LOCK FLUSH 100 UNIT/ML IV SOLN
500.0000 [IU] | INTRAVENOUS | Status: DC | PRN
  Administered 2019-09-02: 12:00:00 500 [IU]

## 2019-09-02 MED ORDER — HEPARIN SOD (PORK) LOCK FLUSH 100 UNIT/ML IV SOLN
INTRAVENOUS | Status: AC
  Administered 2019-09-02: 12:00:00 500 [IU]
  Filled 2019-09-02: qty 5

## 2019-09-03 ENCOUNTER — Telehealth: Payer: Self-pay | Admitting: Internal Medicine

## 2019-09-03 ENCOUNTER — Telehealth: Payer: Self-pay

## 2019-09-03 DIAGNOSIS — J449 Chronic obstructive pulmonary disease, unspecified: Secondary | ICD-10-CM

## 2019-09-03 LAB — ACTH: C206 ACTH: 17.3 pg/mL (ref 7.2–63.3)

## 2019-09-03 NOTE — Telephone Encounter (Signed)
Called and spoke with Daughter Daleen Snook, ACTH stim test showed subnormal stimulation as cortisol did not exceed 18.  Additionally ACTH level was normal, this suggest possibly secondary versus tertiary Adrenal insufficiency.  However patient is feeling, doing better. She will follow up in our [facility] clinic tomorrow, at that time we will likely need to reassess with a BMP as well as renin and aldosterone levels.  Daughter will also bring name/ fax number of her endocrinologist so that we may be able to coordinate care.

## 2019-09-03 NOTE — Telephone Encounter (Signed)
LVM reminder to bring PAP paperwork to clinic appt on 09/04/19.   Brenton Grills, Student-PharmD 09/03/2019 10:42 AM

## 2019-09-04 ENCOUNTER — Ambulatory Visit (INDEPENDENT_AMBULATORY_CARE_PROVIDER_SITE_OTHER): Payer: Medicare Other | Admitting: Internal Medicine

## 2019-09-04 ENCOUNTER — Encounter: Payer: Self-pay | Admitting: Internal Medicine

## 2019-09-04 ENCOUNTER — Other Ambulatory Visit: Payer: Self-pay

## 2019-09-04 ENCOUNTER — Encounter (HOSPITAL_COMMUNITY): Payer: Medicare Other

## 2019-09-04 VITALS — BP 137/62 | HR 84 | Temp 97.6°F | Ht 62.0 in | Wt 165.0 lb

## 2019-09-04 DIAGNOSIS — I959 Hypotension, unspecified: Secondary | ICD-10-CM

## 2019-09-04 DIAGNOSIS — Z90710 Acquired absence of both cervix and uterus: Secondary | ICD-10-CM

## 2019-09-04 DIAGNOSIS — J449 Chronic obstructive pulmonary disease, unspecified: Secondary | ICD-10-CM

## 2019-09-04 DIAGNOSIS — I10 Essential (primary) hypertension: Secondary | ICD-10-CM | POA: Diagnosis not present

## 2019-09-04 DIAGNOSIS — Z79899 Other long term (current) drug therapy: Secondary | ICD-10-CM

## 2019-09-04 DIAGNOSIS — Z9221 Personal history of antineoplastic chemotherapy: Secondary | ICD-10-CM

## 2019-09-04 DIAGNOSIS — E2740 Unspecified adrenocortical insufficiency: Secondary | ICD-10-CM

## 2019-09-04 DIAGNOSIS — Z23 Encounter for immunization: Secondary | ICD-10-CM | POA: Diagnosis not present

## 2019-09-04 DIAGNOSIS — E871 Hypo-osmolality and hyponatremia: Secondary | ICD-10-CM

## 2019-09-04 DIAGNOSIS — Z8543 Personal history of malignant neoplasm of ovary: Secondary | ICD-10-CM

## 2019-09-04 MED ORDER — LEVOTHYROXINE SODIUM 150 MCG PO TABS: 150.0000 ug | ORAL_TABLET | Freq: Every day | ORAL | 1 refills | Status: DC

## 2019-09-04 MED ORDER — QUETIAPINE FUMARATE 25 MG PO TABS: 25.0000 mg | ORAL_TABLET | Freq: Every day | ORAL | 1 refills | Status: DC

## 2019-09-04 MED FILL — MEGESTROL 20 MG TABLET: 20 | 30 days supply | Qty: 120 | Fill #1

## 2019-09-04 MED FILL — QUETIAPINE FUMARATE 25 MG T: 25 | 90 days supply | Qty: 90 | Fill #0

## 2019-09-04 MED FILL — LEVOTHYROXINE 150 MCG TAB: 150 | 90 days supply | Qty: 90 | Fill #0

## 2019-09-04 NOTE — Patient Instructions (Signed)
It was a pleasure to see you today [name]. Please make the following changes:  During this visit you got a flu shot   We collected blood work to check your electrolytes and also a renin/aldosterone level  Please make sure that you make an appointment with your primary care doctor and endocrinologist as soon as you return to [facility]  If you have any questions or concerns, please call our clinic at [phone] between 9am-5pm and after hours call [phone] and ask for the internal medicine resident on call. If you feel you are having a medical emergency please call 911.   Thank you, we look forward to help you remain healthy!  Lars Mage, MD Internal Medicine PGY3

## 2019-09-04 NOTE — Progress Notes (Signed)
   CC: Adrenal Insuffiency  HPI:  [name] is a 73 y.o. female with copd, htn, metastatic ovarian cancer s/p hysterectomy and chemotherapy who presents for blood pressure follow up. Please see problem based charting for evaluation, assessment, and plan.  Past Medical History:  Diagnosis Date  . Cancer ([place])   . COPD (chronic obstructive pulmonary disease) ([place])   . Hypertension    Review of Systems:    Review of Systems  Constitutional: Positive for malaise/fatigue. Negative for chills and fever.  Respiratory: Positive for shortness of breath. Negative for cough.   Gastrointestinal: Negative for abdominal pain, nausea and vomiting.  Neurological: Negative for dizziness and headaches.   Physical Exam:  Vitals:   09/04/19 1326  BP: 137/62  Pulse: 84  Temp: 97.6 F (36.4 C)  TempSrc: Oral  SpO2: 99%  Weight: 165 lb (74.8 kg)  Height: 5\' 2"  (1.575 m)   Physical Exam  Constitutional: Appears well-developed and well-nourished. No distress.  HENT:  Head: Normocephalic and atraumatic.  Eyes: Conjunctivae are normal.  Cardiovascular: Normal rate, regular rhythm and normal heart sounds.  Respiratory: Effort normal and breath sounds normal. No respiratory distress. No wheezes.  GI: Soft. Bowel sounds are normal. No distension. There is no tenderness.  Musculoskeletal: No edema.  Neurological: Is alert.  Skin: Not diaphoretic. No erythema.  Psychiatric: Normal mood and affect. Behavior is normal. Judgment and thought content normal.   Assessment & Plan:   See Encounters Tab for problem based charting.  Patient discussed with Dr. Lynnae January

## 2019-09-05 DIAGNOSIS — E2740 Unspecified adrenocortical insufficiency: Secondary | ICD-10-CM | POA: Insufficient documentation

## 2019-09-05 NOTE — Assessment & Plan Note (Signed)
The patient's blood pressure during this visit is 137/62. At home, blood pressure has ranged 110-130s/70-80s. The patient has not gotten hypotensive.   -patient is to continue at decreased dose of amlodipine 5mg  qd

## 2019-09-05 NOTE — Assessment & Plan Note (Signed)
During recent hospitalization there was concern for possible adrenal insufficiency given that the patient had hypotension and hyponatremia.The patient recently had a acth stimulation test which did not show sufficient stimulation cortisol above 18-20. However, the patient's ACTH level was normal at 17.3.   Assessment and plan  Since ACTH was normal while cortisol was low with stimulation there is concern for secondary adrenal insuffiiciency. The patient has several risk factors which pre-dispose her to have secondary adrenal insuffiiciency- medications (was on high dose steroids), metastatic ovarian cancer. Will check renin/aldosterone ratio and recehck bmp.   The patient had several acute issues such as ovarian cancer and PRES which could offer an alternative explanation for the patient's hypotension and hyponatremia. Therefore, recommended the patient follow with her endocrinologist in [place] regarding this.  -bmp -renin/aldosterone

## 2019-09-09 NOTE — Telephone Encounter (Signed)
I agree

## 2019-09-09 NOTE — Progress Notes (Signed)
Internal Medicine Clinic Attending  Case discussed with Dr. Chundi at the time of the visit.  We reviewed the resident's history and exam and pertinent patient test results.  I agree with the assessment, diagnosis, and plan of care documented in the resident's note. 

## 2019-09-12 ENCOUNTER — Telehealth: Payer: Self-pay | Admitting: Internal Medicine

## 2019-09-23 MED ORDER — INCRUSE ELLIPTA 62.5 MCG/INH IN AEPB: 1.0000 | INHALATION_SPRAY | Freq: Every day | RESPIRATORY_TRACT | 3 refills | Status: AC

## 2019-09-23 NOTE — Progress Notes (Signed)
Patient was denied by [employer] patient assistance program. Application sent for therapeutic alternative, Incruse to see if patient can qualify.

## 2019-09-24 ENCOUNTER — Ambulatory Visit (INDEPENDENT_AMBULATORY_CARE_PROVIDER_SITE_OTHER): Payer: Medicare Other | Admitting: Diagnostic Neuroimaging

## 2019-09-24 ENCOUNTER — Encounter: Payer: Self-pay | Admitting: Diagnostic Neuroimaging

## 2019-09-24 ENCOUNTER — Other Ambulatory Visit: Payer: Self-pay

## 2019-09-24 VITALS — BP 121/69 | HR 82 | Temp 98.5°F | Ht 62.0 in | Wt 165.2 lb

## 2019-09-24 DIAGNOSIS — I6783 Posterior reversible encephalopathy syndrome: Secondary | ICD-10-CM | POA: Diagnosis not present

## 2019-09-24 NOTE — Progress Notes (Signed)
[facility]  PATIENT: Rhonda Hale DOB: October 13, 1946  REFERRING CLINICIAN: N Nischal HISTORY FROM: patient  REASON FOR VISIT: new consult    HISTORICAL  CHIEF COMPLAINT:  Chief Complaint  Patient presents with  . PRES    rm 7 New Pt, dgtr- Christa    HISTORY OF PRESENT ILLNESS:   73 year old female with metastatic ovarian cancer, here for evaluation follow-up of PRES syndrome.  Patient was hospitalized in July 03, 2019 altered mental status and severe hypertension.  Patient was diagnosed with PRES , possibly related to recent chemotherapy, hypertension and pneumonia.  Patient gradually improved and was discharged on 07/17/2019.  She was readmitted on 07/26/2019 with tremors and confusion, myoclonus, diagnosed with metabolic encephalopathy.  She was treated with Seroquel for agitation.  Electrolytes were normalized.  She was transferred to rehab on 08/02/2019.  Since that time patient has returned home.  Patient is doing well.  She is almost back to baseline.  She sometimes have some mild brain fog and word finding difficulties.   REVIEW OF SYSTEMS: Full 14 system review of systems performed and negative with exception of: As per HPI.  ALLERGIES: Allergies  Allergen Reactions  . Atorvastatin Other (See Comments)    Severe heartburn, reflux    HOME MEDICATIONS: Outpatient Medications Prior to Visit  Medication Sig Dispense Refill  . acetaminophen (TYLENOL) 325 MG tablet Take 2 tablets (650 mg total) by mouth every 6 (six) hours as needed for mild pain (or Fever >/= 101).    Marland Kitchen amLODipine (NORVASC) 5 MG tablet Take 5 mg by mouth daily.    Marland Kitchen apixaban (ELIQUIS) 5 MG TABS tablet Take 1 tablet (5 mg total) by mouth 2 (two) times daily. 60 tablet 3  . calcitRIOL (ROCALTROL) 0.5 MCG capsule Take 1 capsule (0.5 mcg total) by mouth daily. 30 capsule 0  . Calcium Carb-Cholecalciferol (CALCIUM-VITAMIN D) 600-400 MG-UNIT TABS Take 1 tablet by mouth 2 (two) times a day.    .  escitalopram (LEXAPRO) 20 MG tablet Take 1 tablet (20 mg total) by mouth daily. 30 tablet 0  . labetalol (NORMODYNE) 100 MG tablet Take 0.5 tablets (50 mg total) by mouth 2 (two) times daily. 60 tablet 0  . levothyroxine (SYNTHROID) 150 MCG tablet Take 1 tablet (150 mcg total) by mouth daily before breakfast. 90 tablet 1  . megestrol (MEGACE) 20 MG tablet Take 2 tablets (40 mg total) by mouth 2 (two) times daily. 60 tablet 0  . Multiple Vitamin (MULTIVITAMIN WITH MINERALS) TABS tablet Take 1 tablet by mouth daily.    . QUEtiapine (SEROQUEL) 25 MG tablet Take 1 tablet (25 mg total) by mouth at bedtime. 90 tablet 1  . umeclidinium bromide (INCRUSE ELLIPTA) 62.5 MCG/INH AEPB Inhale 1 puff into the lungs daily. 3 each 3   No facility-administered medications prior to visit.     PAST MEDICAL HISTORY: Past Medical History:  Diagnosis Date  . Cancer ([place])   . COPD (chronic obstructive pulmonary disease) ([place])   . Hypertension   . PRES (posterior reversible encephalopathy syndrome)     PAST SURGICAL HISTORY: Past Surgical History:  Procedure Laterality Date  . ABDOMINAL HYSTERECTOMY N/A   . CATARACT EXTRACTION, BILATERAL    . CHOLECYSTECTOMY    . HIP SURGERY Right   . LEG SURGERY Left   . LUNG SURGERY    . SPLENECTOMY, TOTAL    . THYROIDECTOMY      FAMILY HISTORY: No family history on file.  SOCIAL HISTORY: Social  History   Socioeconomic History  . Marital status: Divorced    Spouse name: Not on file  . Number of children: 2  . Years of education: Not on file  . Highest education level: Some college, no degree  Occupational History  . Not on file  Social Needs  . Financial resource strain: Not on file  . Food insecurity    Worry: Not on file    Inability: Not on file  . Transportation needs    Medical: Not on file    Non-medical: Not on file  Tobacco Use  . Smoking status: Former Smoker    Years: 20.00    Types: Cigarettes  . Smokeless tobacco: Never Used  .  Tobacco comment: quit yrs ago  Substance and Sexual Activity  . Alcohol use: Not Currently  . Drug use: Never  . Sexual activity: Not on file  Lifestyle  . Physical activity    Days per week: Not on file    Minutes per session: Not on file  . Stress: Not on file  Relationships  . Social Herbalist on phone: Not on file    Gets together: Not on file    Attends religious service: Not on file    Active member of club or organization: Not on file    Attends meetings of clubs or organizations: Not on file    Relationship status: Not on file  . Intimate partner violence    Fear of current or ex partner: Not on file    Emotionally abused: Not on file    Physically abused: Not on file    Forced sexual activity: Not on file  Other Topics Concern  . Not on file  Social History Narrative   Lives with daughter   Caffeine- 1 serving a day     PHYSICAL EXAM  GENERAL EXAM/CONSTITUTIONAL: Vitals:  Vitals:   09/24/19 1411  BP: 121/69  Pulse: 82  Temp: 98.5 F (36.9 C)  Weight: 165 lb 3.2 oz (74.9 kg)  Height: 5\' 2"  (1.575 m)     Body mass index is 30.22 kg/m. Wt Readings from Last 3 Encounters:  09/24/19 165 lb 3.2 oz (74.9 kg)  09/04/19 165 lb (74.8 kg)  09/02/19 166 lb (75.3 kg)     Patient is in no distress; well developed, nourished and groomed; neck is supple  CARDIOVASCULAR:  Examination of carotid arteries is normal; no carotid bruits  Regular rate and rhythm, no murmurs  Examination of peripheral vascular system by observation and palpation is normal  EYES:  Ophthalmoscopic exam of optic discs and posterior segments is normal; no papilledema or hemorrhages  No exam data present  MUSCULOSKELETAL:  Gait, strength, tone, movements noted in Neurologic exam below  NEUROLOGIC: MENTAL STATUS:  No flowsheet data found.  awake, alert, oriented to person, place and time  recent and remote memory intact  normal attention and concentration   language fluent, comprehension intact, naming intact  fund of knowledge appropriate  CRANIAL NERVE:   2nd - no papilledema on fundoscopic exam  2nd, 3rd, 4th, 6th - pupils equal and reactive to light, visual fields full to confrontation, extraocular muscles intact, no nystagmus  5th - facial sensation symmetric  7th - facial strength symmetric  8th - hearing intact  9th - palate elevates symmetrically, uvula midline  11th - shoulder shrug symmetric  12th - tongue protrusion midline  MOTOR:   normal bulk and tone, full strength in the BUE, BLE  SENSORY:   normal and symmetric to light touch  COORDINATION:   finger-nose-finger, fine finger movements normal  REFLEXES:   deep tendon reflexes TRACE and symmetric  GAIT/STATION:   narrow based gait     DIAGNOSTIC DATA (LABS, IMAGING, TESTING) - I reviewed patient records, labs, notes, testing and imaging myself where available.  Lab Results  Component Value Date   WBC 13.5 (H) 09/02/2019   HGB 9.0 (L) 09/02/2019   HCT 28.6 (L) 09/02/2019   MCV 101.4 (H) 09/02/2019   PLT 444 (H) 09/02/2019      Component Value Date/Time   NA 140 09/04/2019 1420   K 3.5 09/04/2019 1420   CL 103 09/04/2019 1420   CO2 18 (L) 09/04/2019 1420   GLUCOSE 72 09/04/2019 1420   GLUCOSE 87 08/21/2019 1113   BUN 12 09/04/2019 1420   CREATININE 1.01 (H) 09/04/2019 1420   CALCIUM 11.1 (H) 09/04/2019 1420   PROT 4.9 (L) 08/05/2019 0421   PROT 6.2 07/24/2019 1144   ALBUMIN 2.2 (L) 08/05/2019 0421   ALBUMIN 4.1 07/24/2019 1144   AST 22 08/05/2019 0421   ALT 37 08/05/2019 0421   ALKPHOS 52 08/05/2019 0421   BILITOT 0.7 08/05/2019 0421   BILITOT <0.2 07/24/2019 1144   GFRNONAA 55 (L) 09/04/2019 1420   GFRAA 63 09/04/2019 1420   No results found for: CHOL, HDL, LDLCALC, LDLDIRECT, TRIG, CHOLHDL No results found for: HGBA1C Lab Results  Component Value Date   VITAMINB12 568 07/28/2019   Lab Results  Component Value Date   TSH  6.424 (H) 07/28/2019     07/30/19 MRI brain [I reviewed images myself and agree with interpretation. -VRP]  -Examination significantly limited by susceptibility artifact, and to a lesser degree by patient motion as described. - Signal abnormality within the non-dominant distal right vertebral artery, which may reflect slow flow or high-grade stenosis/occlusion.  - Otherwise, no evidence of acute intracranial abnormality. - Mild generalized parenchymal atrophy with moderate chronic small vessel ischemic disease. Redemonstrated small chronic right cerebellar lacunar infarct.  07/06/19 MRI brain [I reviewed images myself and agree with interpretation. -VRP]  - Abnormal hyperintense T2-weighted signal within the thalami, both posterior hemispheres and both cerebellar hemispheres in a pattern most consistent with posterior reversible encephalopathy syndrome (PRES).    ASSESSMENT AND PLAN  73 y.o. year old female here with encephalopathy and confusion related to hypertension, chemotherapy, infection resulting in PRES. she had readmission for metabolic encephalopathy.  Overall doing better.  Dx:  1. PRES (posterior reversible encephalopathy syndrome)     PLAN:  PRES (improved; July and Aug 2020 hospitalizations; related to chemo, HTN and infx) - continue supportive care  Return for return to PCP, pending if symptoms worsen or fail to improve.    Penni Bombard, MD [id number], [id number] PM Certified in Neurology, Neurophysiology and Neuroimaging  [facility] [address] [place], [place] [address] [phone]

## 2019-09-25 ENCOUNTER — Other Ambulatory Visit: Payer: Self-pay | Admitting: Internal Medicine

## 2019-09-25 MED FILL — LABETALOL HCL 100MG TABLET: 100 | 60 days supply | Qty: 60 | Fill #0

## 2019-09-26 MED FILL — ESCITALOPRAM 20 MG TABLET: 20 | 90 days supply | Qty: 90 | Fill #0

## 2019-10-01 MED FILL — MEGESTROL 20 MG TABLET: 20 | 30 days supply | Qty: 120 | Fill #2

## 2019-10-04 ENCOUNTER — Telehealth: Payer: Self-pay | Admitting: Pharmacist

## 2019-10-04 NOTE — Progress Notes (Signed)
Patient was approved for free Incruse through patient assistance program. Patient was contacted and notified.

## 2019-11-06 ENCOUNTER — Encounter: Payer: Self-pay | Admitting: Internal Medicine

## 2019-11-06 MED ORDER — AMLODIPINE BESYLATE 5 MG PO TABS: 5.0000 mg | ORAL_TABLET | Freq: Every day | ORAL | 3 refills | Status: DC

## 2019-11-06 MED FILL — LIDOCAINE-PRILOCAINE CREAM: 2.5-2.5 | 21 days supply | Qty: 30 | Fill #0

## 2019-11-06 MED FILL — AMLODIPINE BESYLATE 5 MG TA: 5 | 90 days supply | Qty: 90 | Fill #0

## 2019-11-06 MED FILL — MEGESTROL 20 MG TABLET: 20 | 30 days supply | Qty: 120 | Fill #3

## 2019-11-12 DIAGNOSIS — K56609 Unspecified intestinal obstruction, unspecified as to partial versus complete obstruction: Secondary | ICD-10-CM

## 2019-11-12 HISTORY — DX: Unspecified intestinal obstruction, unspecified as to partial versus complete obstruction: K56.609

## 2019-11-20 ENCOUNTER — Other Ambulatory Visit: Payer: Self-pay | Admitting: Internal Medicine

## 2019-11-21 ENCOUNTER — Other Ambulatory Visit (HOSPITAL_COMMUNITY)
Admission: RE | Admit: 2019-11-21 | Discharge: 2019-11-21 | Disposition: A | Discharge status: Home / Self Care | Payer: Medicare Other | Source: Other Acute Inpatient Hospital | Attending: Internal Medicine | Admitting: Internal Medicine

## 2019-11-21 ENCOUNTER — Other Ambulatory Visit: Payer: Self-pay

## 2019-11-21 DIAGNOSIS — Z20828 Contact with and (suspected) exposure to other viral communicable diseases: Secondary | ICD-10-CM | POA: Diagnosis present

## 2019-11-21 DIAGNOSIS — Z20822 Contact with and (suspected) exposure to covid-19: Secondary | ICD-10-CM

## 2019-11-23 LAB — NOVEL CORONAVIRUS, NAA (HOSP ORDER, SEND-OUT TO REF LAB; TAT 18-24 HRS): SARS-CoV-2, NAA: NOT DETECTED

## 2019-11-24 MED ORDER — LABETALOL HCL 100 MG PO TABS: 50.0000 mg | ORAL_TABLET | Freq: Two times a day (BID) | ORAL | 1 refills | Status: DC

## 2019-11-25 MED FILL — LABETALOL HCL 100MG TABLET: 100 | 90 days supply | Qty: 90 | Fill #0

## 2019-11-26 MED FILL — MAGIC MOUTHWASH BOP FORM: 3 days supply | Qty: 120 | Fill #0

## 2019-11-26 MED FILL — CIPROFLOXACIN HCL 500 MG TA: 500 | 2 days supply | Qty: 4 | Fill #0

## 2019-11-26 MED FILL — MIRTAZAPINE 15 MG ODT: 15 | 30 days supply | Qty: 30 | Fill #0

## 2019-11-27 MED FILL — oxyCODONE HCL 5 MG TABS: 5 | 5 days supply | Qty: 30 | Fill #0

## 2019-11-27 MED FILL — LIDOCAINE 2% VISCOUS SOLN: 2 | 10 days supply | Qty: 100 | Fill #0

## 2019-12-09 ENCOUNTER — Inpatient Hospital Stay (HOSPITAL_COMMUNITY)
Admission: EM | Admit: 2019-12-09 | Discharge: 2019-12-17 | DRG: 330 | Disposition: A | Discharge status: Home Health | Payer: Medicare Other | Attending: Internal Medicine | Admitting: Internal Medicine

## 2019-12-09 ENCOUNTER — Other Ambulatory Visit: Payer: Self-pay

## 2019-12-09 ENCOUNTER — Encounter (HOSPITAL_COMMUNITY): Payer: Self-pay

## 2019-12-09 DIAGNOSIS — Z9049 Acquired absence of other specified parts of digestive tract: Secondary | ICD-10-CM

## 2019-12-09 DIAGNOSIS — Z79890 Hormone replacement therapy (postmenopausal): Secondary | ICD-10-CM

## 2019-12-09 DIAGNOSIS — Z7901 Long term (current) use of anticoagulants: Secondary | ICD-10-CM

## 2019-12-09 DIAGNOSIS — K5939 Other megacolon: Secondary | ICD-10-CM | POA: Diagnosis present

## 2019-12-09 DIAGNOSIS — Z87891 Personal history of nicotine dependence: Secondary | ICD-10-CM

## 2019-12-09 DIAGNOSIS — Z4659 Encounter for fitting and adjustment of other gastrointestinal appliance and device: Secondary | ICD-10-CM

## 2019-12-09 DIAGNOSIS — E890 Postprocedural hypothyroidism: Secondary | ICD-10-CM | POA: Diagnosis present

## 2019-12-09 DIAGNOSIS — R000 Tachycardia, unspecified: Secondary | ICD-10-CM | POA: Diagnosis not present

## 2019-12-09 DIAGNOSIS — J449 Chronic obstructive pulmonary disease, unspecified: Secondary | ICD-10-CM | POA: Diagnosis present

## 2019-12-09 DIAGNOSIS — Z888 Allergy status to other drugs, medicaments and biological substances status: Secondary | ICD-10-CM

## 2019-12-09 DIAGNOSIS — E870 Hyperosmolality and hypernatremia: Secondary | ICD-10-CM | POA: Diagnosis not present

## 2019-12-09 DIAGNOSIS — Z90710 Acquired absence of both cervix and uterus: Secondary | ICD-10-CM

## 2019-12-09 DIAGNOSIS — M62838 Other muscle spasm: Secondary | ICD-10-CM | POA: Diagnosis not present

## 2019-12-09 DIAGNOSIS — Z803 Family history of malignant neoplasm of breast: Secondary | ICD-10-CM

## 2019-12-09 DIAGNOSIS — Z9081 Acquired absence of spleen: Secondary | ICD-10-CM

## 2019-12-09 DIAGNOSIS — K429 Umbilical hernia without obstruction or gangrene: Secondary | ICD-10-CM | POA: Diagnosis present

## 2019-12-09 DIAGNOSIS — Z86711 Personal history of pulmonary embolism: Secondary | ICD-10-CM

## 2019-12-09 DIAGNOSIS — K56609 Unspecified intestinal obstruction, unspecified as to partial versus complete obstruction: Secondary | ICD-10-CM | POA: Diagnosis not present

## 2019-12-09 DIAGNOSIS — Z79899 Other long term (current) drug therapy: Secondary | ICD-10-CM

## 2019-12-09 DIAGNOSIS — C569 Malignant neoplasm of unspecified ovary: Secondary | ICD-10-CM | POA: Diagnosis present

## 2019-12-09 DIAGNOSIS — Z90722 Acquired absence of ovaries, bilateral: Secondary | ICD-10-CM

## 2019-12-09 DIAGNOSIS — E876 Hypokalemia: Secondary | ICD-10-CM | POA: Diagnosis present

## 2019-12-09 DIAGNOSIS — Z20822 Contact with and (suspected) exposure to covid-19: Secondary | ICD-10-CM | POA: Diagnosis present

## 2019-12-09 DIAGNOSIS — I10 Essential (primary) hypertension: Secondary | ICD-10-CM | POA: Diagnosis present

## 2019-12-09 DIAGNOSIS — C786 Secondary malignant neoplasm of retroperitoneum and peritoneum: Secondary | ICD-10-CM | POA: Diagnosis present

## 2019-12-09 DIAGNOSIS — Z515 Encounter for palliative care: Secondary | ICD-10-CM

## 2019-12-09 LAB — URINALYSIS, ROUTINE W REFLEX MICROSCOPIC
Bilirubin Urine: NEGATIVE
Glucose, UA: NEGATIVE mg/dL
Hgb urine dipstick: NEGATIVE
Ketones, ur: 5 mg/dL — AB
Leukocytes,Ua: NEGATIVE
Nitrite: NEGATIVE
Protein, ur: 30 mg/dL — AB
Specific Gravity, Urine: 1.016 (ref 1.005–1.030)
pH: 5 (ref 5.0–8.0)

## 2019-12-09 LAB — COMPREHENSIVE METABOLIC PANEL
ALT: 11 U/L (ref 0–44)
AST: 17 U/L (ref 15–41)
Albumin: 3.1 g/dL — ABNORMAL LOW (ref 3.5–5.0)
Alkaline Phosphatase: 63 U/L (ref 38–126)
Anion gap: 13 (ref 5–15)
BUN: 13 mg/dL (ref 8–23)
CO2: 25 mmol/L (ref 22–32)
Calcium: 10.6 mg/dL — ABNORMAL HIGH (ref 8.9–10.3)
Chloride: 102 mmol/L (ref 98–111)
Creatinine, Ser: 1.08 mg/dL — ABNORMAL HIGH (ref 0.44–1.00)
GFR calc Af Amer: 58 mL/min — ABNORMAL LOW (ref >60)
GFR calc non Af Amer: 50 mL/min — ABNORMAL LOW (ref >60)
Glucose, Bld: 120 mg/dL — ABNORMAL HIGH (ref 70–99)
Potassium: 3.1 mmol/L — ABNORMAL LOW (ref 3.5–5.1)
Sodium: 140 mmol/L (ref 135–145)
Total Bilirubin: 0.4 mg/dL (ref 0.3–1.2)
Total Protein: 6.8 g/dL (ref 6.5–8.1)

## 2019-12-09 LAB — CBC
HCT: 31.4 % — ABNORMAL LOW (ref 36.0–46.0)
Hemoglobin: 10.5 g/dL — ABNORMAL LOW (ref 12.0–15.0)
MCH: 31.4 pg (ref 26.0–34.0)
MCHC: 33.4 g/dL (ref 30.0–36.0)
MCV: 94 fL (ref 80.0–100.0)
Platelets: 542 10*3/uL — ABNORMAL HIGH (ref 150–400)
RBC: 3.34 MIL/uL — ABNORMAL LOW (ref 3.87–5.11)
RDW: 18.3 % — ABNORMAL HIGH (ref 11.5–15.5)
WBC: 10.3 10*3/uL (ref 4.0–10.5)
nRBC: 0 % (ref 0.0–0.2)

## 2019-12-09 LAB — LIPASE, BLOOD: Lipase: 20 U/L (ref 11–51)

## 2019-12-09 MED ORDER — ONDANSETRON 4 MG PO TBDP
4.0000 mg | ORAL_TABLET | Freq: Once | ORAL | Status: AC
  Administered 2019-12-09: 23:00:00 4 mg via ORAL
  Filled 2019-12-09: qty 1

## 2019-12-09 NOTE — ED Triage Notes (Signed)
Pt reports lower abd pain for the past 3 days, hx of ovarian cancer, active chemo treatment now. Last Bm was 4 days ago, states she vomited once this morning. Pt a.o

## 2019-12-09 NOTE — ED Notes (Signed)
Patient at triage on a recliner , respirations unlabored , delay explained to patient .

## 2019-12-09 NOTE — ED Notes (Signed)
Pt called for recheck vitals, no answer 

## 2019-12-09 NOTE — ED Notes (Signed)
Pt was called for recheck of v/s, no answer

## 2019-12-10 ENCOUNTER — Encounter (HOSPITAL_COMMUNITY): Payer: Self-pay | Admitting: Radiology

## 2019-12-10 ENCOUNTER — Emergency Department (HOSPITAL_COMMUNITY): Payer: Medicare Other

## 2019-12-10 DIAGNOSIS — Z87891 Personal history of nicotine dependence: Secondary | ICD-10-CM

## 2019-12-10 DIAGNOSIS — K56609 Unspecified intestinal obstruction, unspecified as to partial versus complete obstruction: Secondary | ICD-10-CM | POA: Diagnosis present

## 2019-12-10 DIAGNOSIS — Z9081 Acquired absence of spleen: Secondary | ICD-10-CM | POA: Diagnosis not present

## 2019-12-10 DIAGNOSIS — Z86711 Personal history of pulmonary embolism: Secondary | ICD-10-CM

## 2019-12-10 DIAGNOSIS — Z87440 Personal history of urinary (tract) infections: Secondary | ICD-10-CM

## 2019-12-10 DIAGNOSIS — K429 Umbilical hernia without obstruction or gangrene: Secondary | ICD-10-CM | POA: Diagnosis present

## 2019-12-10 DIAGNOSIS — Z79890 Hormone replacement therapy (postmenopausal): Secondary | ICD-10-CM

## 2019-12-10 DIAGNOSIS — C786 Secondary malignant neoplasm of retroperitoneum and peritoneum: Secondary | ICD-10-CM | POA: Diagnosis present

## 2019-12-10 DIAGNOSIS — Z79899 Other long term (current) drug therapy: Secondary | ICD-10-CM

## 2019-12-10 DIAGNOSIS — Z7901 Long term (current) use of anticoagulants: Secondary | ICD-10-CM | POA: Diagnosis not present

## 2019-12-10 DIAGNOSIS — Z90710 Acquired absence of both cervix and uterus: Secondary | ICD-10-CM

## 2019-12-10 DIAGNOSIS — M62838 Other muscle spasm: Secondary | ICD-10-CM | POA: Diagnosis not present

## 2019-12-10 DIAGNOSIS — R000 Tachycardia, unspecified: Secondary | ICD-10-CM | POA: Diagnosis not present

## 2019-12-10 DIAGNOSIS — E039 Hypothyroidism, unspecified: Secondary | ICD-10-CM

## 2019-12-10 DIAGNOSIS — Z515 Encounter for palliative care: Secondary | ICD-10-CM | POA: Diagnosis not present

## 2019-12-10 DIAGNOSIS — I10 Essential (primary) hypertension: Secondary | ICD-10-CM | POA: Diagnosis present

## 2019-12-10 DIAGNOSIS — Z90722 Acquired absence of ovaries, bilateral: Secondary | ICD-10-CM | POA: Diagnosis not present

## 2019-12-10 DIAGNOSIS — Z888 Allergy status to other drugs, medicaments and biological substances status: Secondary | ICD-10-CM | POA: Diagnosis not present

## 2019-12-10 DIAGNOSIS — E870 Hyperosmolality and hypernatremia: Secondary | ICD-10-CM | POA: Diagnosis not present

## 2019-12-10 DIAGNOSIS — E890 Postprocedural hypothyroidism: Secondary | ICD-10-CM | POA: Diagnosis present

## 2019-12-10 DIAGNOSIS — Z20822 Contact with and (suspected) exposure to covid-19: Secondary | ICD-10-CM | POA: Diagnosis present

## 2019-12-10 DIAGNOSIS — E876 Hypokalemia: Secondary | ICD-10-CM | POA: Diagnosis present

## 2019-12-10 DIAGNOSIS — J449 Chronic obstructive pulmonary disease, unspecified: Secondary | ICD-10-CM

## 2019-12-10 DIAGNOSIS — Z803 Family history of malignant neoplasm of breast: Secondary | ICD-10-CM | POA: Diagnosis not present

## 2019-12-10 DIAGNOSIS — C569 Malignant neoplasm of unspecified ovary: Secondary | ICD-10-CM | POA: Diagnosis present

## 2019-12-10 DIAGNOSIS — K5939 Other megacolon: Secondary | ICD-10-CM | POA: Diagnosis present

## 2019-12-10 LAB — COMPREHENSIVE METABOLIC PANEL
ALT: 11 U/L (ref 0–44)
AST: 12 U/L — ABNORMAL LOW (ref 15–41)
Albumin: 2.6 g/dL — ABNORMAL LOW (ref 3.5–5.0)
Alkaline Phosphatase: 55 U/L (ref 38–126)
Anion gap: 12 (ref 5–15)
BUN: 12 mg/dL (ref 8–23)
CO2: 25 mmol/L (ref 22–32)
Calcium: 9.5 mg/dL (ref 8.9–10.3)
Chloride: 106 mmol/L (ref 98–111)
Creatinine, Ser: 1.02 mg/dL — ABNORMAL HIGH (ref 0.44–1.00)
GFR calc Af Amer: >60 mL/min (ref >60)
GFR calc non Af Amer: 54 mL/min — ABNORMAL LOW (ref >60)
Glucose, Bld: 90 mg/dL (ref 70–99)
Potassium: 2.6 mmol/L — CL (ref 3.5–5.1)
Sodium: 143 mmol/L (ref 135–145)
Total Bilirubin: 0.7 mg/dL (ref 0.3–1.2)
Total Protein: 5.9 g/dL — ABNORMAL LOW (ref 6.5–8.1)

## 2019-12-10 LAB — SARS CORONAVIRUS 2 (TAT 6-24 HRS): SARS Coronavirus 2: NEGATIVE

## 2019-12-10 LAB — LACTIC ACID, PLASMA
Lactic Acid, Venous: 1 mmol/L (ref 0.5–1.9)
Lactic Acid, Venous: 0.8 mmol/L (ref 0.5–1.9)

## 2019-12-10 LAB — CREATININE, SERUM
Creatinine, Ser: 0.91 mg/dL (ref 0.44–1.00)
GFR calc Af Amer: >60 mL/min (ref >60)
GFR calc non Af Amer: >60 mL/min (ref >60)

## 2019-12-10 LAB — PHOSPHORUS: Phosphorus: 2.7 mg/dL (ref 2.5–4.6)

## 2019-12-10 LAB — MAGNESIUM: Magnesium: 1.1 mg/dL — ABNORMAL LOW (ref 1.7–2.4)

## 2019-12-10 MED ORDER — ACETAMINOPHEN 650 MG RE SUPP: 650.0000 mg | Freq: Four times a day (QID) | RECTAL | Status: DC | PRN

## 2019-12-10 MED ORDER — KETOROLAC TROMETHAMINE 30 MG/ML IJ SOLN
30.0000 mg | Freq: Three times a day (TID) | INTRAMUSCULAR | Status: AC | PRN
  Administered 2019-12-10 – 2019-12-15 (×5): 30 mg via INTRAVENOUS
  Filled 2019-12-10 (×5): qty 1

## 2019-12-10 MED ORDER — AMLODIPINE 1 MG/ML ORAL SUSPENSION: 5.0000 mg | Freq: Every day | ORAL | Status: DC

## 2019-12-10 MED ORDER — LABETALOL HCL 100 MG PO TABS
50.0000 mg | ORAL_TABLET | Freq: Two times a day (BID) | ORAL | Status: DC
  Administered 2019-12-10 – 2019-12-17 (×14): 50 mg
  Filled 2019-12-10 (×14): qty 1

## 2019-12-10 MED ORDER — LABETALOL 40 MG/ML PEDIATRIC ORAL SUSPENSION: 50.0000 mg | Freq: Two times a day (BID) | ORAL | Status: DC

## 2019-12-10 MED ORDER — SODIUM CHLORIDE 0.9% FLUSH
10.0000 mL | INTRAVENOUS | Status: DC | PRN
  Administered 2019-12-17: 15:00:00 10 mL

## 2019-12-10 MED ORDER — PROCHLORPERAZINE EDISYLATE 10 MG/2ML IJ SOLN: 10.0000 mg | Freq: Four times a day (QID) | INTRAMUSCULAR | Status: DC | PRN

## 2019-12-10 MED ORDER — UMECLIDINIUM BROMIDE 62.5 MCG/INH IN AEPB
1.0000 | INHALATION_SPRAY | Freq: Every day | RESPIRATORY_TRACT | Status: DC
  Administered 2019-12-10 – 2019-12-17 (×7): 1 via RESPIRATORY_TRACT
  Filled 2019-12-10: qty 7

## 2019-12-10 MED ORDER — POTASSIUM CHLORIDE 10 MEQ/100ML IV SOLN
10.0000 meq | INTRAVENOUS | Status: AC
  Administered 2019-12-10 (×2): 10 meq via INTRAVENOUS
  Filled 2019-12-10 (×2): qty 100

## 2019-12-10 MED ORDER — POTASSIUM CHLORIDE IN NACL 40-0.9 MEQ/L-% IV SOLN
INTRAVENOUS | Status: DC
  Administered 2019-12-10 – 2019-12-12 (×6): 75 mL/h via INTRAVENOUS
  Administered 2019-12-14: 06:00:00 50 mL/h via INTRAVENOUS
  Filled 2019-12-10 (×6): qty 1000

## 2019-12-10 MED ORDER — IOHEXOL 300 MG/ML  SOLN
100.0000 mL | Freq: Once | INTRAMUSCULAR | Status: AC | PRN
  Administered 2019-12-10: 04:00:00 100 mL via INTRAVENOUS

## 2019-12-10 MED ORDER — AMLODIPINE BESYLATE 5 MG PO TABS
5.0000 mg | ORAL_TABLET | Freq: Every day | ORAL | Status: DC
  Administered 2019-12-10 – 2019-12-17 (×7): 5 mg
  Filled 2019-12-10 (×7): qty 1

## 2019-12-10 MED ORDER — LEVOTHYROXINE SODIUM 100 MCG/5ML IV SOLN
75.0000 ug | Freq: Every day | INTRAVENOUS | Status: DC
  Administered 2019-12-10 – 2019-12-12 (×2): 75 ug via INTRAVENOUS
  Filled 2019-12-10 (×3): qty 5

## 2019-12-10 MED ORDER — ENOXAPARIN SODIUM 40 MG/0.4ML ~~LOC~~ SOLN
40.0000 mg | SUBCUTANEOUS | Status: DC
  Administered 2019-12-10 – 2019-12-11 (×2): 40 mg via SUBCUTANEOUS
  Filled 2019-12-10 (×2): qty 0.4

## 2019-12-10 MED ORDER — SODIUM CHLORIDE 0.9 % IV BOLUS
500.0000 mL | Freq: Once | INTRAVENOUS | Status: AC
  Administered 2019-12-10: 04:00:00 500 mL via INTRAVENOUS

## 2019-12-10 MED ORDER — MAGNESIUM SULFATE 2 GM/50ML IV SOLN
2.0000 g | Freq: Once | INTRAVENOUS | Status: AC
  Administered 2019-12-10: 2 g via INTRAVENOUS
  Filled 2019-12-10: qty 50

## 2019-12-10 MED ORDER — MAGNESIUM SULFATE 2 GM/50ML IV SOLN
2.0000 g | Freq: Once | INTRAVENOUS | Status: AC
  Administered 2019-12-10: 13:00:00 2 g via INTRAVENOUS
  Filled 2019-12-10: qty 50

## 2019-12-10 MED ORDER — FENTANYL CITRATE (PF) 100 MCG/2ML IJ SOLN
50.0000 ug | Freq: Once | INTRAMUSCULAR | Status: AC
  Administered 2019-12-10: 06:00:00 50 ug via INTRAVENOUS
  Filled 2019-12-10: qty 2

## 2019-12-10 MED ORDER — ENOXAPARIN SODIUM 40 MG/0.4ML ~~LOC~~ SOLN: 40.0000 mg | SUBCUTANEOUS | Status: DC

## 2019-12-10 MED ORDER — CHLORHEXIDINE GLUCONATE CLOTH 2 % EX PADS
6.0000 | MEDICATED_PAD | Freq: Every day | CUTANEOUS | Status: DC
  Administered 2019-12-10: 10:00:00 6 via TOPICAL

## 2019-12-10 MED ORDER — ACETAMINOPHEN 325 MG PO TABS: 650.0000 mg | ORAL_TABLET | Freq: Four times a day (QID) | ORAL | Status: DC | PRN

## 2019-12-10 MED ORDER — ONDANSETRON HCL 4 MG/2ML IJ SOLN: 4.0000 mg | INTRAMUSCULAR | Status: DC | PRN

## 2019-12-10 MED ORDER — HEPARIN SODIUM (PORCINE) 5000 UNIT/ML IJ SOLN
5000.0000 [IU] | Freq: Three times a day (TID) | INTRAMUSCULAR | Status: DC
  Administered 2019-12-10: 07:00:00 5000 [IU] via SUBCUTANEOUS
  Filled 2019-12-10: qty 1

## 2019-12-10 MED ORDER — WHITE PETROLATUM EX OINT
TOPICAL_OINTMENT | CUTANEOUS | Status: AC
  Administered 2019-12-10: 0.2
  Filled 2019-12-10: qty 28.35

## 2019-12-10 NOTE — H&P (Signed)
Date: 12/10/2019               Patient Name:  Rhonda Hale MRN: [id number]  DOB: [birth year] Age / Sex: 73 y.o., female   PCP: Marianna Payment, MD         Medical Service: Internal Medicine Teaching Service         Attending Physician: Dr. Sid Falcon, MD    First Contact: Dr. Eileen Stanford Pager: [phone]  Second Contact: Dr. Madilyn Fireman Pager: [phone]       After Hours (After 5p/  First Contact Pager: [phone]  weekends / holidays): Second Contact Pager: [phone]   Chief Complaint: Abdominal pain and emesis  History of Present Illness: Rhonda Hale is a 73 year old female with past medical history of COPD, HTN, hypothyroidism, ovarian cancer diagnosed 2018 s/p total hysterectomy & splenectomy with debulking surgery.  She is accompanied by her daughter who gave much of the story. She was admitted to OSH on 12/11 for UTI and constipation, which her daughter thought may be a SBO but CT scan was negative. She was sent home with cipro for UTI. She came home on the 15th and first vomited that day. Since that time she has been listless. Two days ago she vomited four times and again yesterday. She was passing gas this entire time but her abdominal pain became worse and so she came in. She had some very small pellet like stool this morning but otherwise has not had any BM.  After her discharge last week repeat UA showed another UTI and she was started on macrobid three days ago with plan for 7 day course. She has not had symptoms of dysuria since finishing cipro.Her oncologist is at [facility] in [place]. She is currently undergoing chemotherapy for recurrence of her ovarian cancer. She was only in remission after her surgery in 2018 until about April 2019 when she restarted chemotherapy. Denies any chest pain, SOB, or cough.   In the ED an NG tube was placed.   Meds:  Current Meds  Medication Sig  . acetaminophen (TYLENOL) 325 MG tablet Take 2 tablets (650 mg total) by mouth every 6 (six) hours as  needed for mild pain (or Fever >/= 101).  Marland Kitchen amLODipine (NORVASC) 5 MG tablet Take 1 tablet (5 mg total) by mouth daily.  Marland Kitchen apixaban (ELIQUIS) 5 MG TABS tablet Take 1 tablet (5 mg total) by mouth 2 (two) times daily.  . calcitRIOL (ROCALTROL) 0.5 MCG capsule Take 1 capsule (0.5 mcg total) by mouth daily.  . Calcium Carb-Cholecalciferol (CALCIUM-VITAMIN D) 600-400 MG-UNIT TABS Take 1 tablet by mouth 2 (two) times a day.  . escitalopram (LEXAPRO) 20 MG tablet Take 1 tablet (20 mg total) by mouth daily.  Marland Kitchen labetalol (NORMODYNE) 100 MG tablet Take 0.5 tablets (50 mg total) by mouth 2 (two) times daily.  Marland Kitchen levothyroxine (SYNTHROID) 150 MCG tablet Take 1 tablet (150 mcg total) by mouth daily before breakfast.  . megestrol (MEGACE) 20 MG tablet Take 2 tablets (40 mg total) by mouth 2 (two) times daily.  . nitrofurantoin, macrocrystal-monohydrate, (MACROBID) 100 MG capsule Take 100 mg by mouth 2 (two) times daily.  . QUEtiapine (SEROQUEL) 25 MG tablet Take 1 tablet (25 mg total) by mouth at bedtime. (Patient taking differently: Take 12.5 mg by mouth at bedtime. )  . umeclidinium bromide (INCRUSE ELLIPTA) 62.5 MCG/INH AEPB Inhale 1 puff into the lungs daily.     Allergies: Allergies as of 12/09/2019 - Review Complete 12/09/2019  Allergen Reaction Noted  . Atorvastatin Other (See Comments) 06/21/2017   Past Medical History:  Diagnosis Date  . Cancer ([place])   . COPD (chronic obstructive pulmonary disease) ([place])   . Hypertension   . PRES (posterior reversible encephalopathy syndrome)     Family History: Mother had post menopausal breast cancer.   Social History:  Social History   Tobacco Use  . Smoking status: Former Smoker    Years: 20.00    Types: Cigarettes  . Smokeless tobacco: Never Used  . Tobacco comment: quit yrs ago  Substance Use Topics  . Alcohol use: Not Currently  . Drug use: Never     Review of Systems: A complete ROS was negative except as per HPI.   Physical  Exam: Blood pressure 139/65, pulse (!) 105, temperature 99.5 F (37.5 C), temperature source Oral, resp. rate 18, SpO2 93 %.  General: NAD HEENT: NG tube in place, draining biliary colored fluid Cardiovascular: RRR, no murmurs rubs gallops Pulmonary :  Abdominal: Decreased bowel sounds, Tender to palpation in LUQ, Abdomen Firm, umbilical hernia  Musculoskeletal: no deformities, no edema Skin: warm, dry Neurological: AOx3, No focal defecits Psychiatric/Behavioral:  normal mood, normal affect  CT Abdomen Pelvis 12/29  IMPRESSION: 1. Findings consistent with colonic obstruction with abrupt transition point in the left upper quadrant at the region of the splenic flexure. Findings may be due to adhesions given prior splenectomy, however this is in the region of ill-defined soft tissue density which is poorly defined. It is unclear if this soft tissue density represents decompressed adjacent bowel loops or possibly omental disease given history of ovarian cancer. 2. New retroperitoneal and mesenteric adenopathy, suspicious for metastatic disease in the setting of ovarian cancer. 3. Small amount of free fluid/ascites in the upper quadrants and right lower quadrant. 4. Umbilical hernia contains loop of small bowel, however no associated small bowel obstruction or inflammation.  Assessment & Plan by Problem: Active Problems:   Large bowel obstruction (HCC)  [name] is a 73 year old female with past medical history of COPD, HTN, hypothyroidism, ovarian cancer diagnosed 2018 s/p total hysterectomy & splenectomy with debulking surgery p/w abdominal pain, nausea, vomiting and found to have colonic obstruction.  #Colonic obstruction:  Obstruction on CT, 2/2 adhesions from partial splenectomy 2 years ago due to ovarian cancer vs possible mets now with lymphadenopathy. On chemotherapy. Surgery consulted and will evaluate this morning. - NG tube - NPO - QTc 527, holding zofran. Repeat  EKG prior to restarting.   #Hypokalemia  K 3.1, replete  #Hypothyroidism - resume synthroid as able  #HTN Currently normotensive. Home meds include labetalol and amlodipine while NPO and normotensive.   #Recent UTI Treated with full course of cipro. Repeat UA showed growth of other organism, but she has been asymptomatic since initial treatment. UA this morning showed rare bacteria only.  - stop macrobid   Dispo: Admit patient to Inpatient with expected length of stay greater than 2 midnights.  Signed:  Tamsen Snider, MD PGY1  [phone]

## 2019-12-10 NOTE — TOC Initial Note (Signed)
Transition of Care [facility]) - Initial/Assessment Note    Patient Details  Name: Rhonda Hale MRN: [id number] Date of Birth: 25-Feb-1946  Transition of Care [facility]) CM/SW Contact:    Alexander Mt, [place] Phone Number: 12/10/2019, 11:54 AM  Clinical Narrative:                 CSW spoke with pt at bedside. Introduced self, role, reason for visit. Pt from home, her adult daughter Daleen Snook is also present in the room- daughter Raquel Sarna also lives in [place] area. Pt moved from [place] area post fall to have more support around her. Prior to admission pt was independent with assistance as needed from her daughters. She has a rollator at home, along with other DME. Pt denies any issues with medication access or transportation. Pt hopes to continue feeling better, let her know that [facility] and CSW team would follow her and assist as needed.   TOC team continuing to follow.   Expected Discharge Plan: Home/Self Care Barriers to Discharge: Continued Medical Work up   Patient Goals and CMS Choice Patient states their goals for this hospitalization and ongoing recovery are:: to get home, feel better CMS Medicare.gov Compare Post Acute Care list provided to:: (n/a at this time) Choice offered to / list presented to : NA  Expected Discharge Plan and Services Expected Discharge Plan: Home/Self Care In-house Referral: Clinical Social Work Discharge Planning Services: CM Consult Post Acute Care Choice: NA Living arrangements for the past 2 months: Single Family Home  Prior Living Arrangements/Services Living arrangements for the past 2 months: Single Family Home Lives with:: Self Patient language and need for interpreter reviewed:: Yes(no needs) Do you feel safe going back to the place where you live?: Yes      Need for Family Participation in Patient Care: Yes (Comment)(assistance, supervision and support as needed) Care giver support system in place?: Yes (comment)(pt adult daughters) Current home  services: DME Criminal Activity/Legal Involvement Pertinent to Current Situation/Hospitalization: No - Comment as needed   Permission Sought/Granted Permission sought to share information with : Family Supports Permission granted to share information with : Yes, Verbal Permission Granted  Share Information with NAME: Ovid Curd     Permission granted to share info w Relationship: daughters  Permission granted to share info w Contact Information: [phone]  Emotional Assessment Appearance:: Appears stated age Attitude/Demeanor/Rapport: Engaged, Gracious Affect (typically observed): Accepting, Adaptable, Appropriate, Quiet Orientation: : Oriented to Place, Oriented to Self, Oriented to  Time, Oriented to Situation Alcohol / Substance Use: Not Applicable Psych Involvement: No (comment)  Admission diagnosis:  Colonic obstruction ([place]) [K56.609] Large bowel obstruction ([place]) [K56.609] Encounter for nasogastric (NG) tube placement [Z46.59] Patient Active Problem List   Diagnosis Date Noted  . Large bowel obstruction ([place]) 12/10/2019  . Adrenal insufficiency ([place]) 09/05/2019  . Hypotension 08/21/2019  . Toxic metabolic encephalopathy 0000000  . Hyponatremia 07/31/2019  . AMS (altered mental status) 07/27/2019  . Dehydration 07/26/2019  . PRES (posterior reversible encephalopathy syndrome) 07/07/2019  . AKI (acute kidney injury) ([place]) 07/03/2019  . Hypercalcemia 07/03/2019  . Hypomagnesemia, chronic 07/03/2019  . Anticoagulated 04/15/2019  . Ovary cancer ([place]) 07/03/2017  . Postoperative hypothyroidism 01/01/2013   PCP:  Marianna Payment, MD Pharmacy:   [place] [place]. [address] [place] [address] Phone: [phone] Fax: [phone]    Readmission Risk Interventions Readmission Risk Prevention Plan 12/10/2019 07/05/2019  Post Dischage Appt - Complete  Medication Screening -  Complete  Transportation Screening Complete Complete  PCP or Specialist Appt within 3-5 Days Not Complete -  Not Complete comments pending medical progression -  HRI or Home Care Consult Complete -  Social Work Consult for Recovery Care Planning/Counseling Complete -  Palliative Care Screening Not Complete -  Medication Review Press photographer) Complete -  Some recent data might be hidden

## 2019-12-10 NOTE — Consult Note (Signed)
Gynecologic Oncology Consultation  Rhonda Hale 73 y.o. female  CC:  Chief Complaint  Patient presents with  . Abdominal Pain  . Emesis    HPI: Rhonda Hale is a 73 year old female with recurrent ovarian cancer. She initially had a CT scan on 06/21/2017 revealing peritoneal carcinomatosis, omental thickening, enlarged paracardiac nodes, and a borderline right external iliac node. She underwent a biopsy of the omentum on 06/30/2017 resulting malignant cells consistent with adenocarcinoma. She began neoadjuvant chemotherapy on 07/06/2017, receiving 4 cycles of neoadjuvant carboplatin and paclitaxel. In October 2018, she underwent genetic testing and was found to be BRCA negative.  Pre-op CT scan showed interval improvement in peritoneal carcinomatosis and colonic distention with no apparent disease progression. She underwent optimal interval debulking on 10/06/2017 including a total abdominal hysterectomy, bilateral salpingo-oophorectomy, omentectomy, splenectomy with no gross residual disease reported. Final path revealed bilateral ovaries as tumor site with cell type as high grade serous carcinoma. She received another 3 cycles of carboplatin and paclitaxel with completion on 12/11/2017.  CT scan post treatment on 12/27/2017 with no residual disease noted.    In August 2019, recurrence was determined by PET scan revealing scattered peritoneal, mesenteric, and possibly omental tumor nodules. She then proceeded with receiving carboplatin and doxil for 7 cycles from 08/24/2018 until 02/15/2019.  CA 125 of 437.0 on 02/11/2019. A PET scan was performed on 03/06/2019 revealing retroperitoneal lymph node recurrence with mixed change in the small tumor nodules in the left upper quadrant. She then went on to receive 4 cycles of weekly taxol and Avastin from 03/20/2019 to 06/19/2019. Due to admission and work up for PRES, chemotherapy was discontinued. She then began doxil starting 10/09/2019. She has received two cycles on  10/09/2019 and 11/06/2019 with plans for the third cycle on 12/11/2019.    Of note, echocardiogram on 08/23/2018 was normal prior to doxil administration and last on 10/09/2019 normal as well.  She also had an acute PE found on March 15, 2019 and was started on xarelto at that time. Last CA 125 was 1,638 on 12/04/2019. She was 2,478 prior to that on 11/06/2019. Oncology history obtained from [place] from Dr. Juline Patch, GYN [place] with [facility] in [place].  She presented to the [facility] ED in [place] with abdominal pain, emesis. She was previously admitted on 11/22/2019 for UTI and constipation with a CT negative for SBO (but noting progressive periaortic and new mesenteric adenopathy) at that time. She was discharged on the 15th with Cipro and began having episodes of emesis at that time. She was positive for flatus but no bowel movement except for small pellet like stool on 12/10/2019. Her worsening abdominal pain prompted her to seek evaluation.  A CT scan of the abdomen and pelvis was obtained on 12/10/2019 revealing: IMPRESSION: 1. Findings consistent with colonic obstruction with abrupt transition point in the left upper quadrant at the region of the splenic flexure. Findings may be due to adhesions given prior splenectomy, however this is in the region of ill-defined soft tissue density which is poorly defined. It is unclear if this soft tissue density represents decompressed adjacent bowel loops or possibly omental disease given history of ovarian cancer. 2. New retroperitoneal and mesenteric adenopathy, suspicious for metastatic disease in the setting of ovarian cancer. 3. Small amount of free fluid/ascites in the upper quadrants and right lower quadrant. 4. Umbilical hernia contains loop of small bowel, however no associated small bowel obstruction or inflammation. Aortic Atherosclerosis. An NG tube was inserted  and the patient was admitted for management of a colonic obstruction, most  likely from malignancy.  Interval History: She states she is tired from little sleep the night before.  Minimal abdominal pain reported.  Last BM was hard, small amount yesterday.  Was passing some flatus.  Voiding.  No nausea or emesis reported since NG tube placement.  Daughter at the bedside and states her mother came to live with her in [place] after a fall in June and they decided to keep her current oncologist in [place] and would drive to see him periodically through the month for chemo and follow up. Having dry mouth. No other concerns voiced. States they are waiting for Dr. Barry Dienes for final decision about surgery.    Review of Systems: Constitutional: Feels tired.  Positive for decreased appetite and weight loss. No fever, chills.   Cardiovascular: No chest pain, shortness of breath, or edema.  Pulmonary: No cough or wheeze.  Gastrointestinal: Positive for nausea and emesis prior to admission. No diarrhea. No bright red blood per rectum. Positive for hard, small BM yesterday.  Genitourinary: Recently treated for UTI. No frequency, urgency, or dysuria. No vaginal bleeding or discharge.  Musculoskeletal: No myalgia or joint pain. Neurologic: No weakness, numbness, or change in gait.  Psychology: No depression, anxiety, or insomnia.  Current Meds:  . amLODipine  5 mg Per Tube Daily  . Chlorhexidine Gluconate Cloth  6 each Topical Daily  . [START ON 12/11/2019] enoxaparin (LOVENOX) injection  40 mg Subcutaneous Q24H  . labetalol  50 mg Per Tube BID  . levothyroxine  75 mcg Intravenous Daily  . umeclidinium bromide  1 puff Inhalation Daily    Allergy:  Allergies  Allergen Reactions  . Atorvastatin Other (See Comments)    Severe heartburn, reflux    Social Hx:   Social History   Socioeconomic History  . Marital status: Divorced    Spouse name: Not on file  . Number of children: 2  . Years of education: Not on file  . Highest education level: Some college, no degree   Occupational History  . Not on file  Tobacco Use  . Smoking status: Former Smoker    Years: 20.00    Types: Cigarettes  . Smokeless tobacco: Never Used  . Tobacco comment: quit yrs ago  Substance and Sexual Activity  . Alcohol use: Not Currently  . Drug use: Never  . Sexual activity: Not on file  Other Topics Concern  . Not on file  Social History Narrative   Lives with daughter   Caffeine- 1 serving a day   Social Determinants of Health   Financial Resource Strain:   . Difficulty of Paying Living Expenses: Not on file  Food Insecurity:   . Worried About Charity fundraiser in the Last Year: Not on file  . Ran Out of Food in the Last Year: Not on file  Transportation Needs:   . Lack of Transportation (Medical): Not on file  . Lack of Transportation (Non-Medical): Not on file  Physical Activity:   . Days of Exercise per Week: Not on file  . Minutes of Exercise per Session: Not on file  Stress:   . Feeling of Stress : Not on file  Social Connections:   . Frequency of Communication with Friends and Family: Not on file  . Frequency of Social Gatherings with Friends and Family: Not on file  . Attends Religious Services: Not on file  . Active Member of Clubs or  Organizations: Not on file  . Attends Archivist Meetings: Not on file  . Marital Status: Not on file  Intimate Partner Violence:   . Fear of Current or Ex-Partner: Not on file  . Emotionally Abused: Not on file  . Physically Abused: Not on file  . Sexually Abused: Not on file    Past Surgical Hx:  Past Surgical History:  Procedure Laterality Date  . ABDOMINAL HYSTERECTOMY N/A   . CATARACT EXTRACTION, BILATERAL    . CHOLECYSTECTOMY    . HIP SURGERY Right   . LEG SURGERY Left   . LUNG SURGERY    . SPLENECTOMY, TOTAL    . THYROIDECTOMY      Past Medical Hx:  Past Medical History:  Diagnosis Date  . Cancer ([place])   . COPD (chronic obstructive pulmonary disease) ([place])   . Hypertension   . PRES  (posterior reversible encephalopathy syndrome)     Family Hx: No family history on file.   Mag 1.1 12/10/2019 COVID negative CBC    Component Value Date/Time   WBC 10.3 12/09/2019 1626   RBC 3.34 (L) 12/09/2019 1626   HGB 10.5 (L) 12/09/2019 1626   HGB 10.1 (L) 07/24/2019 1144   HCT 31.4 (L) 12/09/2019 1626   HCT 22.7 (L) 08/09/2019 0424   PLT 542 (H) 12/09/2019 1626   PLT 300 07/24/2019 1144   MCV 94.0 12/09/2019 1626   MCV 99 (H) 07/24/2019 1144   MCH 31.4 12/09/2019 1626   MCHC 33.4 12/09/2019 1626   RDW 18.3 (H) 12/09/2019 1626   RDW 19.8 (H) 07/24/2019 1144   LYMPHSABS 3.1 09/02/2019 1001   MONOABS 1.9 (H) 09/02/2019 1001   EOSABS 0.2 09/02/2019 1001   BASOSABS 0.1 09/02/2019 1001   CMP Latest Ref Rng & Units 12/10/2019 12/09/2019 09/04/2019  Glucose 70 - 99 mg/dL - 120(H) 72  BUN 8 - 23 mg/dL - 13 12  Creatinine 0.44 - 1.00 mg/dL 0.91 1.08(H) 1.01(H)  Sodium 135 - 145 mmol/L - 140 140  Potassium 3.5 - 5.1 mmol/L - 3.1(L) 3.5  Chloride 98 - 111 mmol/L - 102 103  CO2 22 - 32 mmol/L - 25 18(L)  Calcium 8.9 - 10.3 mg/dL - 10.6(H) 11.1(H)  Total Protein 6.5 - 8.1 g/dL - 6.8 -  Total Bilirubin 0.3 - 1.2 mg/dL - 0.4 -  Alkaline Phos 38 - 126 U/L - 63 -  AST 15 - 41 U/L - 17 -  ALT 0 - 44 U/L - 11 -    Vitals:  Blood pressure (!) 147/66, pulse (!) 106, temperature 98.5 F (36.9 C), temperature source Oral, resp. rate 18, SpO2 93 %.  Physical Exam:  General: Well developed, well nourished female in no acute distress. Alert and oriented x 3.  Cardiovascular: Tachycardic at 104. Regular rhythm. S1 and S2 normal.  Lungs: Clear to auscultation bilaterally. No wheezes/crackles/rhonchi noted.   Abdomen: Abdomen distended, firm.  Tympanic on percussion.  Active bowel sounds in all quadrants.  Extremities: No bilateral cyanosis, edema, or clubbing.  NG to intermittent suction with 50 cc dark green output in the canister  Assessment/Plan: 73 year old female with  progressive, recurrent ovarian cancer with suspected colonic obstruction on imaging.  Continue plan per [facility]. There is no role for any cancer treatment during surgery (palliative ostomy) per Dr. Berline Lopes. Agree with possible plan for palliative ostomy.  Contact information given to the patient and daughter. Dr. Berline Lopes to see patient later today.   Cheridan Kibler D  Brittnee Gaetano, NP 12/10/2019, 11:33 AM

## 2019-12-10 NOTE — ED Provider Notes (Signed)
[facility] EMERGENCY DEPARTMENT Provider Note  CSN: [id number] Arrival date & time: 12/09/19 1541  Chief Complaint(s) Abdominal Pain and Emesis  HPI Tinlee Sessum is a 73 y.o. female   The history is provided by the patient.  Abdominal Pain Pain location: lower abd. Pain quality: aching   Pain severity:  Moderate Onset quality:  Gradual Duration:  5 days Timing:  Constant Progression:  Waxing and waning Chronicity:  New Relieved by:  Nothing Worsened by:  Nothing Associated symptoms: anorexia, constipation, nausea and vomiting   Emesis Associated symptoms: abdominal pain     Past Medical History Past Medical History:  Diagnosis Date  . Cancer ([place])   . COPD (chronic obstructive pulmonary disease) ([place])   . Hypertension   . PRES (posterior reversible encephalopathy syndrome)    Patient Active Problem List   Diagnosis Date Noted  . Adrenal insufficiency ([place]) 09/05/2019  . Hypotension 08/21/2019  . Toxic metabolic encephalopathy 0000000  . Hyponatremia 07/31/2019  . AMS (altered mental status) 07/27/2019  . Dehydration 07/26/2019  . PRES (posterior reversible encephalopathy syndrome) 07/07/2019  . AKI (acute kidney injury) ([place]) 07/03/2019  . Hypercalcemia 07/03/2019  . Hypomagnesemia, chronic 07/03/2019  . Anticoagulated 04/15/2019  . Ovary cancer ([place]) 07/03/2017  . Postoperative hypothyroidism 01/01/2013   Home Medication(s) Prior to Admission medications   Medication Sig Start Date End Date Taking? Authorizing Provider  acetaminophen (TYLENOL) 325 MG tablet Take 2 tablets (650 mg total) by mouth every 6 (six) hours as needed for mild pain (or Fever >/= 101). 08/08/19  Yes Angiulli, Lavon Paganini, PA-C  amLODipine (NORVASC) 5 MG tablet Take 1 tablet (5 mg total) by mouth daily. 11/06/19  Yes Axel Filler, MD  apixaban (ELIQUIS) 5 MG TABS tablet Take 1 tablet (5 mg total) by mouth 2 (two) times daily. 08/10/19  Yes Meredith Staggers,  MD  calcitRIOL (ROCALTROL) 0.5 MCG capsule Take 1 capsule (0.5 mcg total) by mouth daily. 08/09/19  Yes Angiulli, Lavon Paganini, PA-C  Calcium Carb-Cholecalciferol (CALCIUM-VITAMIN D) 600-400 MG-UNIT TABS Take 1 tablet by mouth 2 (two) times a day.   Yes [provider]  escitalopram (LEXAPRO) 20 MG tablet Take 1 tablet (20 mg total) by mouth daily. 08/09/19  Yes Angiulli, Lavon Paganini, PA-C  labetalol (NORMODYNE) 100 MG tablet Take 0.5 tablets (50 mg total) by mouth 2 (two) times daily. 11/24/19  Yes Marianna Payment, MD  levothyroxine (SYNTHROID) 150 MCG tablet Take 1 tablet (150 mcg total) by mouth daily before breakfast. 09/04/19 12/09/28 Yes Chundi, Vahini, MD  megestrol (MEGACE) 20 MG tablet Take 2 tablets (40 mg total) by mouth 2 (two) times daily. 08/08/19  Yes Angiulli, Lavon Paganini, PA-C  nitrofurantoin, macrocrystal-monohydrate, (MACROBID) 100 MG capsule Take 100 mg by mouth 2 (two) times daily. 12/07/19 12/14/19 Yes [provider]  QUEtiapine (SEROQUEL) 25 MG tablet Take 1 tablet (25 mg total) by mouth at bedtime. Patient taking differently: Take 12.5 mg by mouth at bedtime.  09/04/19 12/09/28 Yes Chundi, Vahini, MD  umeclidinium bromide (INCRUSE ELLIPTA) 62.5 MCG/INH AEPB Inhale 1 puff into the lungs daily. 09/23/19  Yes Forde Dandy, PharmD  Multiple Vitamin (MULTIVITAMIN WITH MINERALS) TABS tablet Take 1 tablet by mouth daily. Patient not taking: Reported on 12/10/2019 08/09/19   Cathlyn Parsons, PA-C  Past Surgical History Past Surgical History:  Procedure Laterality Date  . ABDOMINAL HYSTERECTOMY N/A   . CATARACT EXTRACTION, BILATERAL    . CHOLECYSTECTOMY    . HIP SURGERY Right   . LEG SURGERY Left   . LUNG SURGERY    . SPLENECTOMY, TOTAL    . THYROIDECTOMY     Family History No family history on file.  Social History Social History    Tobacco Use  . Smoking status: Former Smoker    Years: 20.00    Types: Cigarettes  . Smokeless tobacco: Never Used  . Tobacco comment: quit yrs ago  Substance Use Topics  . Alcohol use: Not Currently  . Drug use: Never   Allergies Atorvastatin  Review of Systems Review of Systems  Gastrointestinal: Positive for abdominal pain, anorexia, constipation, nausea and vomiting.   All other systems are reviewed and are negative for acute change except as noted in the HPI  Physical Exam Vital Signs  I have reviewed the triage vital signs BP 139/65   Pulse (!) 109   Temp 99.5 F (37.5 C) (Oral)   Resp 18   SpO2 94%   Physical Exam Vitals reviewed.  Constitutional:      General: She is not in acute distress.    Appearance: She is well-developed. She is not diaphoretic.  HENT:     Head: Normocephalic and atraumatic.     Right Ear: External ear normal.     Left Ear: External ear normal.     Nose: Nose normal.  Eyes:     General: No scleral icterus.    Conjunctiva/sclera: Conjunctivae normal.  Neck:     Trachea: Phonation normal.  Cardiovascular:     Rate and Rhythm: Normal rate and regular rhythm.  Pulmonary:     Effort: Pulmonary effort is normal. No respiratory distress.     Breath sounds: No stridor.  Abdominal:     General: There is distension.     Palpations: Abdomen is rigid.     Tenderness: There is generalized abdominal tenderness. There is no guarding or rebound.     Hernia: No hernia is present.    Musculoskeletal:        General: Normal range of motion.     Cervical back: Normal range of motion.  Neurological:     Mental Status: She is alert and oriented to person, place, and time.  Psychiatric:        Behavior: Behavior normal.     ED Results and Treatments Labs (all labs ordered are listed, but only abnormal results are displayed) Labs Reviewed  COMPREHENSIVE METABOLIC PANEL - Abnormal; Notable for the following components:      Result Value    Potassium 3.1 (*)    Glucose, Bld 120 (*)    Creatinine, Ser 1.08 (*)    Calcium 10.6 (*)    Albumin 3.1 (*)    GFR calc non Af Amer 50 (*)    GFR calc Af Amer 58 (*)    All other components within normal limits  CBC - Abnormal; Notable for the following components:   RBC 3.34 (*)    Hemoglobin 10.5 (*)    HCT 31.4 (*)    RDW 18.3 (*)    Platelets 542 (*)    All other components within normal limits  URINALYSIS, ROUTINE W REFLEX MICROSCOPIC - Abnormal; Notable for the following components:   APPearance HAZY (*)    Ketones, ur 5 (*)    Protein, ur  30 (*)    Bacteria, UA RARE (*)    All other components within normal limits  SARS CORONAVIRUS 2 (TAT 6-24 HRS)  LIPASE, BLOOD                                                                                                                         EKG  EKG Interpretation  Date/Time:    Ventricular Rate:    PR Interval:    QRS Duration:   QT Interval:    QTC Calculation:   R Axis:     Text Interpretation:        Radiology CT ABDOMEN PELVIS W CONTRAST  Result Date: 12/10/2019 CLINICAL DATA:  Nausea and vomiting. Lower abdominal pain and constipation. Bowel obstruction suspected. History of ovarian cancer, active chemotherapy. EXAM: CT ABDOMEN AND PELVIS WITH CONTRAST TECHNIQUE: Multidetector CT imaging of the abdomen and pelvis was performed using the standard protocol following bolus administration of intravenous contrast. CONTRAST:  123mL OMNIPAQUE IOHEXOL 300 MG/ML  SOLN COMPARISON:  Abdominal CT 07/05/2019 FINDINGS: Lower chest: Heterogeneous basilar pulmonary parenchyma, diminished heterogeneity from prior exam. Hepatobiliary: No focal hepatic lesion. Clips in the gallbladder fossa postcholecystectomy. No biliary dilatation. Pancreas: No ductal dilatation or inflammation. Spleen: Splenectomy. Adrenals/Urinary Tract: No adrenal nodule. No hydronephrosis or perinephric edema. Coarse calcification in the lower right kidney favored  to be intraparenchymal rather than nonobstructing stone. Slight lobular bilateral renal contours. Urinary bladder is partially distended, grossly normal but partially obscured by streak artifact from right hip arthroplasty. Stomach/Bowel: Cecum, ascending, and transverse colon are dilated significantly. Cecum measures up to 9.3 cm. No pneumatosis. There is abrupt transition point from dilated to nondilated bowel in the left upper quadrant the region of the splenic flexure, series 3, image 18. descending and sigmoid colon are decompressed. No definite mesenteric twist or mesenteric swirling. Distal ileum is slightly patulous, no significant small bowel dilatation. Umbilical hernia contains small bowel without inflammation or small bowel obstruction. Fluid/ingested material in the stomach, questionable gastric wall thickening anteriorly. Vascular/Lymphatic: Aortic atherosclerosis without aneurysm. Retroperitoneal adenopathy which is new from prior exam, including 12 mm left periaortic node, series 3, image 33. 14 mm retrocaval node series 3, image 28. Multiple additional prominent retroperitoneal and central mesenteric lymph nodes. Few prominent portal caval nodes are subcentimeter. Reproductive: Reported hysterectomy, however pelvic structures are obscured by streak artifact from right hip arthroplasty. No visualized adnexal mass. Other: Small volume of free fluid/ascites in the left greater than right upper quadrant. Minimal free fluid in the right lower quadrant. Mild central mesenteric edema. Ill-defined soft tissue density in the central upper abdomen, series 3, image 19, difficult to delineate decompressed bowel from adenopathy/omental nodularity. Small fat containing right upper ventral abdominal wall hernia. Periumbilical hernia contains nonobstructed or inflamed small bowel. No free air. Musculoskeletal: Right hip arthroplasty. No acute osseous abnormality or focal bone lesion. Degenerative change in the  spine. IMPRESSION: 1. Findings consistent with colonic obstruction with abrupt transition point in the left  upper quadrant at the region of the splenic flexure. Findings may be due to adhesions given prior splenectomy, however this is in the region of ill-defined soft tissue density which is poorly defined. It is unclear if this soft tissue density represents decompressed adjacent bowel loops or possibly omental disease given history of ovarian cancer. 2. New retroperitoneal and mesenteric adenopathy, suspicious for metastatic disease in the setting of ovarian cancer. 3. Small amount of free fluid/ascites in the upper quadrants and right lower quadrant. 4. Umbilical hernia contains loop of small bowel, however no associated small bowel obstruction or inflammation. Aortic Atherosclerosis (ICD10-I70.0). Electronically Signed   By: Keith Rake M.D.   On: 12/10/2019 04:54    Pertinent labs & imaging results that were available during my care of the patient were reviewed by me and considered in my medical decision making (see chart for details).  Medications Ordered in ED Medications  fentaNYL (SUBLIMAZE) injection 50 mcg (has no administration in time range)  ondansetron (ZOFRAN) injection 4 mg (has no administration in time range)  sodium chloride flush (NS) 0.9 % injection 10-40 mL (has no administration in time range)  Chlorhexidine Gluconate Cloth 2 % PADS 6 each (has no administration in time range)  ondansetron (ZOFRAN-ODT) disintegrating tablet 4 mg (4 mg Oral Given 12/09/19 2302)  sodium chloride 0.9 % bolus 500 mL (500 mLs Intravenous New Bag/Given 12/10/19 0411)  iohexol (OMNIPAQUE) 300 MG/ML solution 100 mL (100 mLs Intravenous Contrast Given 12/10/19 0427)                                                                                                                                    Procedures Procedures  (including critical care time)  Medical Decision Making / ED Course I have  reviewed the nursing notes for this encounter and the patient's prior records (if available in EHR or on provided paperwork).   Bernestine Nalley was evaluated in Emergency Department on 12/10/2019 for the symptoms described in the history of present illness. She was evaluated in the context of the global COVID-19 pandemic, which necessitated consideration that the patient might be at risk for infection with the SARS-CoV-2 virus that causes COVID-19. Institutional protocols and algorithms that pertain to the evaluation of patients at risk for COVID-19 are in a state of rapid change based on information released by regulatory bodies including the CDC and federal and state organizations. These policies and algorithms were followed during the patient's care in the ED.  Work-up notable for colonic obstruction likely related to prior surgery/known malignancy.  Patient is otherwise stable.  NG tube inserted.  Will admit for continued management.      Final Clinical Impression(s) / ED Diagnoses Final diagnoses:  Colonic obstruction ([place])      This chart was dictated using voice recognition software.  Despite best efforts to proofread,  errors can occur which can change the documentation meaning.   Addison Lank  Johnsie Cancel, MD 12/10/19 [phone]

## 2019-12-10 NOTE — Consult Note (Signed)
Reason for Consult:colon obstruction Referring Physician: Dr. Judge Stall Rhonda Hale is an 73 y.o. female.  HPI: 73 yo female with history of ovarian cancer s/p TAH BSO in 2018. Since then she has had several rounds of chemotherapy. She was admitted earlier in December with constipation/UTI which improved. Now she has gotten more distended and yesterday had multiple bouts of vomiting. She had some small bowel movements yesterday but has not had a moderate/large size stool in a long time. She has lost 20 pounds this year.  Past Medical History:  Diagnosis Date  . Cancer ([place])   . COPD (chronic obstructive pulmonary disease) ([place])   . Hypertension   . PRES (posterior reversible encephalopathy syndrome)     Past Surgical History:  Procedure Laterality Date  . ABDOMINAL HYSTERECTOMY N/A   . CATARACT EXTRACTION, BILATERAL    . CHOLECYSTECTOMY    . HIP SURGERY Right   . LEG SURGERY Left   . LUNG SURGERY    . SPLENECTOMY, TOTAL    . THYROIDECTOMY      No family history on file.  Social History:  reports that she has quit smoking. Her smoking use included cigarettes. She quit after 20.00 years of use. She has never used smokeless tobacco. She reports previous alcohol use. She reports that she does not use drugs.  Allergies:  Allergies  Allergen Reactions  . Atorvastatin Other (See Comments)    Severe heartburn, reflux    Medications: I have reviewed the patient's current medications.  Results for orders placed or performed during the hospital encounter of 12/09/19 (from the past 48 hour(s))  Lipase, blood     Status: None   Collection Time: 12/09/19  4:26 PM  Result Value Ref Range   Lipase 20 11 - 51 U/L    Comment: Performed at [facility], [place] [address], [place], [place] [address]  Comprehensive metabolic panel     Status: Abnormal   Collection Time: 12/09/19  4:26 PM  Result Value Ref Range   Sodium 140 135 - 145 mmol/L   Potassium 3.1 (L) 3.5 - 5.1 mmol/L   Chloride 102 98 - 111 mmol/L   CO2 25 22 - 32 mmol/L   Glucose, Bld 120 (H) 70 - 99 mg/dL   BUN 13 8 - 23 mg/dL   Creatinine, Ser 1.08 (H) 0.44 - 1.00 mg/dL   Calcium 10.6 (H) 8.9 - 10.3 mg/dL   Total Protein 6.8 6.5 - 8.1 g/dL   Albumin 3.1 (L) 3.5 - 5.0 g/dL   AST 17 15 - 41 U/L   ALT 11 0 - 44 U/L   Alkaline Phosphatase 63 38 - 126 U/L   Total Bilirubin 0.4 0.3 - 1.2 mg/dL   GFR calc non Af Amer 50 (L) >60 mL/min   GFR calc Af Amer 58 (L) >60 mL/min   Anion gap 13 5 - 15    Comment: Performed at [facility] [address] [place], [place] [address]  CBC     Status: Abnormal   Collection Time: 12/09/19  4:26 PM  Result Value Ref Range   WBC 10.3 4.0 - 10.5 K/uL   RBC 3.34 (L) 3.87 - 5.11 MIL/uL   Hemoglobin 10.5 (L) 12.0 - 15.0 g/dL   HCT 31.4 (L) 36.0 - 46.0 %   MCV 94.0 80.0 - 100.0 fL   MCH 31.4 26.0 - 34.0 pg   MCHC 33.4 30.0 - 36.0 g/dL   RDW 18.3 (H)  11.5 - 15.5 %   Platelets 542 (H) 150 - 400 K/uL   nRBC 0.0 0.0 - 0.2 %    Comment: Performed at [facility], [place] [address], [place], [place] [address]  Urinalysis, Routine w reflex microscopic     Status: Abnormal   Collection Time: 12/09/19 10:51 PM  Result Value Ref Range   Color, Urine YELLOW YELLOW   APPearance HAZY (A) CLEAR   Specific Gravity, Urine 1.016 1.005 - 1.030   pH 5.0 5.0 - 8.0   Glucose, UA NEGATIVE NEGATIVE mg/dL   Hgb urine dipstick NEGATIVE NEGATIVE   Bilirubin Urine NEGATIVE NEGATIVE   Ketones, ur 5 (A) NEGATIVE mg/dL   Protein, ur 30 (A) NEGATIVE mg/dL   Nitrite NEGATIVE NEGATIVE   Leukocytes,Ua NEGATIVE NEGATIVE   RBC / HPF 0-5 0 - 5 RBC/hpf   WBC, UA 21-50 0 - 5 WBC/hpf   Bacteria, UA RARE (A) NONE SEEN   Squamous Epithelial / LPF 6-10 0 - 5   Mucus PRESENT    Hyaline Casts, UA PRESENT    Ca Oxalate Crys, UA PRESENT     Comment: Performed at [facility], [place] [address], [place], [place] [address]    CT ABDOMEN PELVIS W CONTRAST  Result Date:  12/10/2019 CLINICAL DATA:  Nausea and vomiting. Lower abdominal pain and constipation. Bowel obstruction suspected. History of ovarian cancer, active chemotherapy. EXAM: CT ABDOMEN AND PELVIS WITH CONTRAST TECHNIQUE: Multidetector CT imaging of the abdomen and pelvis was performed using the standard protocol following bolus administration of intravenous contrast. CONTRAST:  147mL OMNIPAQUE IOHEXOL 300 MG/ML  SOLN COMPARISON:  Abdominal CT 07/05/2019 FINDINGS: Lower chest: Heterogeneous basilar pulmonary parenchyma, diminished heterogeneity from prior exam. Hepatobiliary: No focal hepatic lesion. Clips in the gallbladder fossa postcholecystectomy. No biliary dilatation. Pancreas: No ductal dilatation or inflammation. Spleen: Splenectomy. Adrenals/Urinary Tract: No adrenal nodule. No hydronephrosis or perinephric edema. Coarse calcification in the lower right kidney favored to be intraparenchymal rather than nonobstructing stone. Slight lobular bilateral renal contours. Urinary bladder is partially distended, grossly normal but partially obscured by streak artifact from right hip arthroplasty. Stomach/Bowel: Cecum, ascending, and transverse colon are dilated significantly. Cecum measures up to 9.3 cm. No pneumatosis. There is abrupt transition point from dilated to nondilated bowel in the left upper quadrant the region of the splenic flexure, series 3, image 18. descending and sigmoid colon are decompressed. No definite mesenteric twist or mesenteric swirling. Distal ileum is slightly patulous, no significant small bowel dilatation. Umbilical hernia contains small bowel without inflammation or small bowel obstruction. Fluid/ingested material in the stomach, questionable gastric wall thickening anteriorly. Vascular/Lymphatic: Aortic atherosclerosis without aneurysm. Retroperitoneal adenopathy which is new from prior exam, including 12 mm left periaortic node, series 3, image 33. 14 mm retrocaval node series 3, image  28. Multiple additional prominent retroperitoneal and central mesenteric lymph nodes. Few prominent portal caval nodes are subcentimeter. Reproductive: Reported hysterectomy, however pelvic structures are obscured by streak artifact from right hip arthroplasty. No visualized adnexal mass. Other: Small volume of free fluid/ascites in the left greater than right upper quadrant. Minimal free fluid in the right lower quadrant. Mild central mesenteric edema. Ill-defined soft tissue density in the central upper abdomen, series 3, image 19, difficult to delineate decompressed bowel from adenopathy/omental nodularity. Small fat containing right upper ventral abdominal wall hernia. Periumbilical hernia contains nonobstructed or inflamed small bowel. No free air. Musculoskeletal: Right hip arthroplasty. No acute osseous abnormality or focal bone lesion. Degenerative change  in the spine. IMPRESSION: 1. Findings consistent with colonic obstruction with abrupt transition point in the left upper quadrant at the region of the splenic flexure. Findings may be due to adhesions given prior splenectomy, however this is in the region of ill-defined soft tissue density which is poorly defined. It is unclear if this soft tissue density represents decompressed adjacent bowel loops or possibly omental disease given history of ovarian cancer. 2. New retroperitoneal and mesenteric adenopathy, suspicious for metastatic disease in the setting of ovarian cancer. 3. Small amount of free fluid/ascites in the upper quadrants and right lower quadrant. 4. Umbilical hernia contains loop of small bowel, however no associated small bowel obstruction or inflammation. Aortic Atherosclerosis (ICD10-I70.0). Electronically Signed   By: Keith Rake M.D.   On: 12/10/2019 04:54   DG Abd Portable 1V  Result Date: 12/10/2019 CLINICAL DATA:  NG tube placement. EXAM: PORTABLE ABDOMEN - 1 VIEW COMPARISON:  CT earlier this day. FINDINGS: Tip and side port  of the enteric tube in the left upper quadrant, likely in the stomach below the diaphragm, however diaphragm not well visualized given field of view. Gaseous distension of transverse unchanged, distended stool-filled proximal colon, better seen on CT. Excreted IV contrast in both renal collecting systems and urinary bladder. IMPRESSION: Tip and side port of the enteric tube below the diaphragm in the stomach. Electronically Signed   By: Keith Rake M.D.   On: 12/10/2019 06:44    Review of Systems  Constitutional: Positive for weight loss. Negative for chills and fever.  HENT: Negative for hearing loss.   Eyes: Negative for blurred vision and double vision.  Respiratory: Negative for cough and hemoptysis.   Cardiovascular: Negative for chest pain and palpitations.  Gastrointestinal: Positive for abdominal pain and vomiting. Negative for nausea.  Genitourinary: Negative for dysuria and urgency.  Musculoskeletal: Negative for myalgias and neck pain.  Skin: Negative for itching and rash.  Neurological: Negative for dizziness, tingling and headaches.  Endo/Heme/Allergies: Does not bruise/bleed easily.  Psychiatric/Behavioral: Negative for depression and suicidal ideas.   Blood pressure 139/65, pulse (!) 105, temperature 99.5 F (37.5 C), temperature source Oral, resp. rate 18, SpO2 93 %. Physical Exam  Vitals reviewed. Constitutional: She is oriented to person, place, and time. She appears well-developed and well-nourished.  HENT:  Head: Normocephalic and atraumatic.  Eyes: Pupils are equal, round, and reactive to light. Conjunctivae and EOM are normal.  Cardiovascular: Normal rate and regular rhythm.  Respiratory: Effort normal and breath sounds normal.  GI: Soft. Bowel sounds are normal. She exhibits distension. There is no abdominal tenderness. There is no rebound and no guarding.  Musculoskeletal:        General: Normal range of motion.     Cervical back: Normal range of motion and  neck supple.  Neurological: She is alert and oriented to person, place, and time.  Skin: Skin is warm and dry.  Psychiatric: She has a normal mood and affect. Her behavior is normal.     Assessment/Plan: 73 yo female with recurrent ovarian cancer that has colonic dilation to the left upper quadrant. She has a history of PE for which she is on eliquis. -hold eliquis -NPO -NG tube -discussed options of surgery which would result in permanent ostomy if successful, but may have carcinomatosis. At this time they would like to pursue aggressive management  Rhonda Hale 12/10/2019, 7:00 AM

## 2019-12-10 NOTE — ED Notes (Signed)
IV team at bedside 

## 2019-12-10 NOTE — Progress Notes (Signed)
Subjective: Rhonda Hale reports some nausea today. She denies abdominal pain unless someone is pressing on her abdomen. We discussed consulting palliative care and while they state they do wish for surgery over a potential bowel rupture, they would like to speak with palliative care. Patient is hungry and thirsty and disappointed she is unable to eat.  Consults: surgery, palliative  Objective:  Vital signs in last 24 hours: Vitals:   12/10/19 0300 12/10/19 0330 12/10/19 0400 12/10/19 0515  BP: (!) 149/74 (!) 151/74 139/65   Pulse: (!) 107 (!) 109  (!) 105  Resp:      Temp:      TempSrc:      SpO2: 94% 94%  93%   Physical Exam  Constitutional: No distress.  Chronically ill appearing female  Cardiovascular: Regular rhythm and intact distal pulses.  No murmur heard. tachycardic  Pulmonary/Chest: Effort normal. No respiratory distress.  Abdominal: She exhibits distension. There is abdominal tenderness (diffusely tender to palpation).  Bowel sounds heard  Neurological: She is alert.  Skin: There is pallor.  Nursing note and vitals reviewed.  I/Os:  Intake/Output Summary (Last 24 hours) at 12/10/2019 0827 Last data filed at 12/10/2019 0810 Gross per 24 hour  Intake 500 ml  Output 300 ml  Net 200 ml   Labs: Results for orders placed or performed during the hospital encounter of 12/09/19 (from the past 24 hour(s))  Lipase, blood     Status: None   Collection Time: 12/09/19  4:26 PM  Result Value Ref Range   Lipase 20 11 - 51 U/L  Comprehensive metabolic panel     Status: Abnormal   Collection Time: 12/09/19  4:26 PM  Result Value Ref Range   Sodium 140 135 - 145 mmol/L   Potassium 3.1 (L) 3.5 - 5.1 mmol/L   Chloride 102 98 - 111 mmol/L   CO2 25 22 - 32 mmol/L   Glucose, Bld 120 (H) 70 - 99 mg/dL   BUN 13 8 - 23 mg/dL   Creatinine, Ser 1.08 (H) 0.44 - 1.00 mg/dL   Calcium 10.6 (H) 8.9 - 10.3 mg/dL   Total Protein 6.8 6.5 - 8.1 g/dL   Albumin 3.1 (L) 3.5 - 5.0 g/dL   AST 17 15 - 41 U/L   ALT 11 0 - 44 U/L   Alkaline Phosphatase 63 38 - 126 U/L   Total Bilirubin 0.4 0.3 - 1.2 mg/dL   GFR calc non Af Amer 50 (L) >60 mL/min   GFR calc Af Amer 58 (L) >60 mL/min   Anion gap 13 5 - 15  CBC     Status: Abnormal   Collection Time: 12/09/19  4:26 PM  Result Value Ref Range   WBC 10.3 4.0 - 10.5 K/uL   RBC 3.34 (L) 3.87 - 5.11 MIL/uL   Hemoglobin 10.5 (L) 12.0 - 15.0 g/dL   HCT 31.4 (L) 36.0 - 46.0 %   MCV 94.0 80.0 - 100.0 fL   MCH 31.4 26.0 - 34.0 pg   MCHC 33.4 30.0 - 36.0 g/dL   RDW 18.3 (H) 11.5 - 15.5 %   Platelets 542 (H) 150 - 400 K/uL   nRBC 0.0 0.0 - 0.2 %  Urinalysis, Routine w reflex microscopic     Status: Abnormal   Collection Time: 12/09/19 10:51 PM  Result Value Ref Range   Color, Urine YELLOW YELLOW   APPearance HAZY (A) CLEAR   Specific Gravity, Urine 1.016 1.005 - 1.030   pH  5.0 5.0 - 8.0   Glucose, UA NEGATIVE NEGATIVE mg/dL   Hgb urine dipstick NEGATIVE NEGATIVE   Bilirubin Urine NEGATIVE NEGATIVE   Ketones, ur 5 (A) NEGATIVE mg/dL   Protein, ur 30 (A) NEGATIVE mg/dL   Nitrite NEGATIVE NEGATIVE   Leukocytes,Ua NEGATIVE NEGATIVE   RBC / HPF 0-5 0 - 5 RBC/hpf   WBC, UA 21-50 0 - 5 WBC/hpf   Bacteria, UA RARE (A) NONE SEEN   Squamous Epithelial / LPF 6-10 0 - 5   Mucus PRESENT    Hyaline Casts, UA PRESENT    Ca Oxalate Crys, UA PRESENT     Imaging:  CT ABDOMEN AND PELVIS WITH CONTRAST IMPRESSION: 1. Findings consistent with colonic obstruction with abrupt transition point in the left upper quadrant at the region of the splenic flexure. Findings may be due to adhesions given prior splenectomy, however this is in the region of ill-defined soft tissue density which is poorly defined. It is unclear if this soft tissue density represents decompressed adjacent bowel loops or possibly omental disease given history of ovarian cancer. 2. New retroperitoneal and mesenteric adenopathy, suspicious for metastatic disease in the  setting of ovarian cancer. 3. Small amount of free fluid/ascites in the upper quadrants and right lower quadrant. 4. Umbilical hernia contains loop of small bowel, however no associated small bowel obstruction or inflammation.  PORTABLE ABDOMEN - 1 VIEW IMPRESSION: Tip and side port of the enteric tube below the diaphragm in the Stomach.  Assessment/Plan:  Assessment: Rhonda Hale is a 73 yo F w/ a PMHx significant for COPD, HTN, hx of PE and metastatic ovarian cancer s/p TAH BSO in 2018 on chemotherapy who presented with a few weeks of increasing abdominal distention and new onset nausea and vomiting found to have a large bowel obstruction with abrupt transition point in the LUQ who is here for medical and possible surgical management.   Plan:  Active Problems:  Large bowel obstruction (HCC) -pt with known metastatic ovarian cancer s/p TAH BSO in 2018 currently on chemotherapy here with large bowel obstruction with abrupt transition point in LUQ -unclear the cause whether from adhesions or tumor burden -NG in place -pt denies pain but has had some nausea, ordered dose of compazine given pt's prolonged QT -surgery consulted who appears to be recommending surgical intervention  -broached topic of palliative care consult with patient and while they would like to proceed with surgery vs a bowel perforation, they are amenable to speaking with palliative tomorrow  Plan: -fu surgery recs -fu palliative recs -maintain NG -NPO -holding eliquis   Metastatic ovarian cancer: -pt with known metastatic ovarian cancer s/p TAH BSO in 2018 currently on chemotherapy here with large bowel obstruction -unclear the cause whether from adhesions or tumor burden with abrupt transition point in LUQ -gyn onc and palliative consulted  Plan: -fu gyn onc recs -fu palliative recs  HTN: -continue home bp meds: amlodipine 5 mg, labetalol 50 mg BID  Hx PE: -holding eliquis for possible surgical  intervention  Dispo: Anticipated discharge pending clinical course.  Al Decant, MD 12/10/2019, 6:57 AM Pager: [phone]

## 2019-12-10 NOTE — Progress Notes (Signed)
CRITICAL VALUE ALERT  Critical Value:  Potassium 2.6  Date & Time Notied:  12/10/2019 at 1617  Provider Notified: paged at 1618, paged at [phone], paged at 58. Dr. Sheppard Coil notified.  Orders Received/Actions taken: awaiting orders to be put in.

## 2019-12-10 NOTE — Progress Notes (Signed)
Patient admitted to unit from Emergency department. Patient has NG tube in place, placed to low intermittent suction per orders. Patient denies pain at time of assessment.  Patient settled in bed and oriented to unit and hospital routine. Pt resting comfortably in bed with call bell in reach and side rails up. Instructed patient to utilize call bell for assistance. Will continue to monitor closely for remainder of shift.

## 2019-12-11 ENCOUNTER — Encounter (HOSPITAL_COMMUNITY): Payer: Self-pay | Admitting: Internal Medicine

## 2019-12-11 ENCOUNTER — Encounter (HOSPITAL_COMMUNITY)
Admission: EM | Disposition: A | Discharge status: Home Health | Payer: Self-pay | Source: Home / Self Care | Attending: Internal Medicine

## 2019-12-11 ENCOUNTER — Inpatient Hospital Stay (HOSPITAL_COMMUNITY): Payer: Medicare Other | Admitting: Anesthesiology

## 2019-12-11 DIAGNOSIS — Z933 Colostomy status: Secondary | ICD-10-CM

## 2019-12-11 HISTORY — PX: COLON RESECTION: SHX5231

## 2019-12-11 LAB — CBC WITH DIFFERENTIAL/PLATELET
Abs Immature Granulocytes: 0.1 10*3/uL — ABNORMAL HIGH (ref 0.00–0.07)
Basophils Absolute: 0.1 10*3/uL (ref 0.0–0.1)
Basophils Relative: 1 %
Eosinophils Absolute: 0.1 10*3/uL (ref 0.0–0.5)
Eosinophils Relative: 1 %
HCT: 26.5 % — ABNORMAL LOW (ref 36.0–46.0)
Hemoglobin: 8.8 g/dL — ABNORMAL LOW (ref 12.0–15.0)
Immature Granulocytes: 1 %
Lymphocytes Relative: 24 %
Lymphs Abs: 2.4 10*3/uL (ref 0.7–4.0)
MCH: 31.8 pg (ref 26.0–34.0)
MCHC: 33.2 g/dL (ref 30.0–36.0)
MCV: 95.7 fL (ref 80.0–100.0)
Monocytes Absolute: 2.1 10*3/uL — ABNORMAL HIGH (ref 0.1–1.0)
Monocytes Relative: 22 %
Neutro Abs: 4.8 10*3/uL (ref 1.7–7.7)
Neutrophils Relative %: 51 %
Platelets: 490 10*3/uL — ABNORMAL HIGH (ref 150–400)
RBC: 2.77 MIL/uL — ABNORMAL LOW (ref 3.87–5.11)
RDW: 18.8 % — ABNORMAL HIGH (ref 11.5–15.5)
WBC: 9.7 10*3/uL (ref 4.0–10.5)
nRBC: 0.2 % (ref 0.0–0.2)

## 2019-12-11 LAB — COMPREHENSIVE METABOLIC PANEL
ALT: 10 U/L (ref 0–44)
AST: 15 U/L (ref 15–41)
Albumin: 2.6 g/dL — ABNORMAL LOW (ref 3.5–5.0)
Alkaline Phosphatase: 54 U/L (ref 38–126)
Anion gap: 12 (ref 5–15)
BUN: 13 mg/dL (ref 8–23)
CO2: 22 mmol/L (ref 22–32)
Calcium: 8.9 mg/dL (ref 8.9–10.3)
Chloride: 112 mmol/L — ABNORMAL HIGH (ref 98–111)
Creatinine, Ser: 1.07 mg/dL — ABNORMAL HIGH (ref 0.44–1.00)
GFR calc Af Amer: 59 mL/min — ABNORMAL LOW (ref >60)
GFR calc non Af Amer: 51 mL/min — ABNORMAL LOW (ref >60)
Glucose, Bld: 77 mg/dL (ref 70–99)
Potassium: 3.3 mmol/L — ABNORMAL LOW (ref 3.5–5.1)
Sodium: 146 mmol/L — ABNORMAL HIGH (ref 135–145)
Total Bilirubin: 0.6 mg/dL (ref 0.3–1.2)
Total Protein: 6.1 g/dL — ABNORMAL LOW (ref 6.5–8.1)

## 2019-12-11 LAB — SURGICAL PCR SCREEN
MRSA, PCR: NEGATIVE
Staphylococcus aureus: NEGATIVE

## 2019-12-11 LAB — TYPE AND SCREEN
ABO/RH(D): A POS
Antibody Screen: NEGATIVE

## 2019-12-11 LAB — MAGNESIUM: Magnesium: 2.8 mg/dL — ABNORMAL HIGH (ref 1.7–2.4)

## 2019-12-11 SURGERY — COLON RESECTION
Anesthesia: General | Site: Abdomen

## 2019-12-11 MED ORDER — POTASSIUM CHLORIDE 10 MEQ/100ML IV SOLN
10.0000 meq | INTRAVENOUS | Status: DC
  Administered 2019-12-11 (×2): 10 meq via INTRAVENOUS
  Filled 2019-12-11 (×2): qty 100

## 2019-12-11 MED ORDER — BOOST / RESOURCE BREEZE PO LIQD CUSTOM
1.0000 | Freq: Three times a day (TID) | ORAL | Status: DC
  Administered 2019-12-13: 1 via ORAL

## 2019-12-11 MED ORDER — LIDOCAINE 2% (20 MG/ML) 5 ML SYRINGE
INTRAMUSCULAR | Status: DC | PRN
  Administered 2019-12-11: 100 mg via INTRAVENOUS

## 2019-12-11 MED ORDER — OXYCODONE HCL 5 MG PO TABS
5.0000 mg | ORAL_TABLET | Freq: Four times a day (QID) | ORAL | Status: DC | PRN
  Administered 2019-12-11 – 2019-12-16 (×10): 5 mg via ORAL
  Filled 2019-12-11 (×12): qty 1

## 2019-12-11 MED ORDER — DEXAMETHASONE SODIUM PHOSPHATE 10 MG/ML IJ SOLN
INTRAMUSCULAR | Status: DC | PRN
  Administered 2019-12-11: 4 mg via INTRAVENOUS

## 2019-12-11 MED ORDER — MAGNESIUM SULFATE 2 GM/50ML IV SOLN
2.0000 g | Freq: Once | INTRAVENOUS | Status: AC
  Administered 2019-12-11: 06:00:00 2 g via INTRAVENOUS
  Filled 2019-12-11: qty 50

## 2019-12-11 MED ORDER — ADULT MULTIVITAMIN W/MINERALS CH
1.0000 | ORAL_TABLET | Freq: Every day | ORAL | Status: DC
  Administered 2019-12-13 – 2019-12-17 (×5): 1 via ORAL
  Filled 2019-12-11 (×6): qty 1

## 2019-12-11 MED ORDER — 0.9 % SODIUM CHLORIDE (POUR BTL) OPTIME
TOPICAL | Status: DC | PRN
  Administered 2019-12-11: 2000 mL

## 2019-12-11 MED ORDER — PHENYLEPHRINE 40 MCG/ML (10ML) SYRINGE FOR IV PUSH (FOR BLOOD PRESSURE SUPPORT)
PREFILLED_SYRINGE | INTRAVENOUS | Status: DC | PRN
  Administered 2019-12-11: 160 ug via INTRAVENOUS
  Administered 2019-12-11: 120 ug via INTRAVENOUS

## 2019-12-11 MED ORDER — SODIUM CHLORIDE 0.9 % IV SOLN
2.0000 g | INTRAVENOUS | Status: AC
  Administered 2019-12-11: 2 g via INTRAVENOUS
  Filled 2019-12-11 (×2): qty 2

## 2019-12-11 MED ORDER — CHLORHEXIDINE GLUCONATE CLOTH 2 % EX PADS
6.0000 | MEDICATED_PAD | Freq: Every day | CUTANEOUS | Status: DC
  Administered 2019-12-11 – 2019-12-17 (×7): 6 via TOPICAL

## 2019-12-11 MED ORDER — PHENYLEPHRINE HCL-NACL 10-0.9 MG/250ML-% IV SOLN
INTRAVENOUS | Status: DC | PRN
  Administered 2019-12-11: 40 ug/min via INTRAVENOUS

## 2019-12-11 MED ORDER — ONDANSETRON HCL 4 MG/2ML IJ SOLN
INTRAMUSCULAR | Status: AC
  Filled 2019-12-11: qty 2

## 2019-12-11 MED ORDER — MAGNESIUM SULFATE 4 GM/100ML IV SOLN
4.0000 g | Freq: Once | INTRAVENOUS | Status: DC
  Filled 2019-12-11: qty 100

## 2019-12-11 MED ORDER — LIDOCAINE 2% (20 MG/ML) 5 ML SYRINGE
INTRAMUSCULAR | Status: AC
  Filled 2019-12-11: qty 5

## 2019-12-11 MED ORDER — PROMETHAZINE HCL 25 MG/ML IJ SOLN: 6.2500 mg | INTRAMUSCULAR | Status: DC | PRN

## 2019-12-11 MED ORDER — ACETAMINOPHEN 10 MG/ML IV SOLN
1000.0000 mg | Freq: Once | INTRAVENOUS | Status: DC | PRN
  Administered 2019-12-11: 12:00:00 1000 mg via INTRAVENOUS

## 2019-12-11 MED ORDER — SUCCINYLCHOLINE CHLORIDE 200 MG/10ML IV SOSY
PREFILLED_SYRINGE | INTRAVENOUS | Status: DC | PRN
  Administered 2019-12-11: 100 mg via INTRAVENOUS

## 2019-12-11 MED ORDER — ARTIFICIAL TEARS OPHTHALMIC OINT
TOPICAL_OINTMENT | OPHTHALMIC | Status: AC
  Filled 2019-12-11: qty 3.5

## 2019-12-11 MED ORDER — PROPOFOL 10 MG/ML IV BOLUS
INTRAVENOUS | Status: DC | PRN
  Administered 2019-12-11: 150 mg via INTRAVENOUS

## 2019-12-11 MED ORDER — LABETALOL HCL 5 MG/ML IV SOLN: 5.0000 mg | Freq: Three times a day (TID) | INTRAVENOUS | Status: DC | PRN

## 2019-12-11 MED ORDER — FENTANYL CITRATE (PF) 250 MCG/5ML IJ SOLN
INTRAMUSCULAR | Status: AC
  Filled 2019-12-11: qty 5

## 2019-12-11 MED ORDER — DEXAMETHASONE SODIUM PHOSPHATE 10 MG/ML IJ SOLN
INTRAMUSCULAR | Status: AC
  Filled 2019-12-11: qty 1

## 2019-12-11 MED ORDER — ROCURONIUM BROMIDE 10 MG/ML (PF) SYRINGE
PREFILLED_SYRINGE | INTRAVENOUS | Status: AC
  Filled 2019-12-11: qty 10

## 2019-12-11 MED ORDER — MAGNESIUM SULFATE 4 GM/100ML IV SOLN
4.0000 g | INTRAVENOUS | Status: DC
  Filled 2019-12-11: qty 100

## 2019-12-11 MED ORDER — FENTANYL CITRATE (PF) 250 MCG/5ML IJ SOLN
INTRAMUSCULAR | Status: DC | PRN
  Administered 2019-12-11: 100 ug via INTRAVENOUS
  Administered 2019-12-11 (×2): 50 ug via INTRAVENOUS

## 2019-12-11 MED ORDER — ACETAMINOPHEN 10 MG/ML IV SOLN
INTRAVENOUS | Status: AC
  Filled 2019-12-11: qty 100

## 2019-12-11 MED ORDER — ONDANSETRON HCL 4 MG/2ML IJ SOLN
INTRAMUSCULAR | Status: DC | PRN
  Administered 2019-12-11: 4 mg via INTRAVENOUS

## 2019-12-11 MED ORDER — POTASSIUM CHLORIDE 10 MEQ/100ML IV SOLN
10.0000 meq | INTRAVENOUS | Status: AC
  Administered 2019-12-11 (×2): 10 meq via INTRAVENOUS
  Filled 2019-12-11 (×2): qty 100

## 2019-12-11 MED ORDER — LACTATED RINGERS IV SOLN: INTRAVENOUS | Status: DC

## 2019-12-11 MED ORDER — SUGAMMADEX SODIUM 200 MG/2ML IV SOLN
INTRAVENOUS | Status: DC | PRN
  Administered 2019-12-11: 200 mg via INTRAVENOUS

## 2019-12-11 MED ORDER — ACETAMINOPHEN 500 MG PO TABS
1000.0000 mg | ORAL_TABLET | ORAL | Status: AC
  Administered 2019-12-11: 10:00:00 1000 mg via ORAL
  Filled 2019-12-11: qty 2

## 2019-12-11 MED ORDER — POTASSIUM CHLORIDE 10 MEQ/100ML IV SOLN: 10.0000 meq | INTRAVENOUS | Status: DC

## 2019-12-11 MED ORDER — PROPOFOL 10 MG/ML IV BOLUS
INTRAVENOUS | Status: AC
  Filled 2019-12-11: qty 20

## 2019-12-11 MED ORDER — HYDROMORPHONE HCL 1 MG/ML IJ SOLN
0.2500 mg | INTRAMUSCULAR | Status: DC | PRN
  Administered 2019-12-11 (×2): 0.5 mg via INTRAVENOUS

## 2019-12-11 MED ORDER — ROCURONIUM BROMIDE 10 MG/ML (PF) SYRINGE
PREFILLED_SYRINGE | INTRAVENOUS | Status: DC | PRN
  Administered 2019-12-11: 50 mg via INTRAVENOUS

## 2019-12-11 MED ORDER — HYDROMORPHONE HCL 1 MG/ML IJ SOLN
INTRAMUSCULAR | Status: AC
  Filled 2019-12-11: qty 1

## 2019-12-11 MED ORDER — MORPHINE SULFATE (PF) 2 MG/ML IV SOLN
2.0000 mg | INTRAVENOUS | Status: DC | PRN
  Filled 2019-12-11: qty 1

## 2019-12-11 SURGICAL SUPPLY — 45 items
BLADE CLIPPER SURG (BLADE) IMPLANT
BRIDGE OSTOMY 3 1/2 (OSTOMY) ×2 IMPLANT
CANISTER SUCT 3000ML PPV (MISCELLANEOUS) ×2 IMPLANT
CATH ROBINSON RED A/P 16FR (CATHETERS) ×4 IMPLANT
COVER SURGICAL LIGHT HANDLE (MISCELLANEOUS) ×4 IMPLANT
COVER WAND RF STERILE (DRAPES) IMPLANT
DRSG OPSITE POSTOP 4X10 (GAUZE/BANDAGES/DRESSINGS) IMPLANT
DRSG OPSITE POSTOP 4X8 (GAUZE/BANDAGES/DRESSINGS) IMPLANT
ELECT CAUTERY BLADE 6.4 (BLADE) ×2 IMPLANT
ELECT REM PT RETURN 9FT ADLT (ELECTROSURGICAL) ×2
ELECTRODE REM PT RTRN 9FT ADLT (ELECTROSURGICAL) ×1 IMPLANT
GLOVE BIOGEL PI IND STRL 7.0 (GLOVE) ×2 IMPLANT
GLOVE BIOGEL PI INDICATOR 7.0 (GLOVE) ×2
GLOVE SURG SS PI 6.5 STRL IVOR (GLOVE) ×2 IMPLANT
GLOVE SURG SS PI 7.0 STRL IVOR (GLOVE) ×4 IMPLANT
GOWN STRL REUS W/ TWL LRG LVL3 (GOWN DISPOSABLE) ×6 IMPLANT
GOWN STRL REUS W/TWL LRG LVL3 (GOWN DISPOSABLE) ×6
HANDLE SUCTION POOLE (INSTRUMENTS) ×1 IMPLANT
KIT OSTOMY DRAINABLE 2.75 STR (WOUND CARE) ×2 IMPLANT
KIT SIGMOIDOSCOPE (SET/KITS/TRAYS/PACK) IMPLANT
KIT TURNOVER KIT B (KITS) ×2 IMPLANT
LIGASURE IMPACT 36 18CM CVD LR (INSTRUMENTS) IMPLANT
NEEDLE 22X1 1/2 (OR ONLY) (NEEDLE) ×2 IMPLANT
NS IRRIG 1000ML POUR BTL (IV SOLUTION) ×4 IMPLANT
PACK COLON (CUSTOM PROCEDURE TRAY) ×2 IMPLANT
PAD ARMBOARD 7.5X6 YLW CONV (MISCELLANEOUS) ×2 IMPLANT
PENCIL SMOKE EVACUATOR (MISCELLANEOUS) ×2 IMPLANT
SPONGE LAP 18X18 RF (DISPOSABLE) IMPLANT
STAPLER VISISTAT 35W (STAPLE) ×2 IMPLANT
SUCTION POOLE HANDLE (INSTRUMENTS) ×2
SURGILUBE 2OZ TUBE FLIPTOP (MISCELLANEOUS) IMPLANT
SUT ETHILON 2 0 FS 18 (SUTURE) ×4 IMPLANT
SUT PDS AB 0 CT 36 (SUTURE) ×4 IMPLANT
SUT PROLENE 0 CT 1 30 (SUTURE) IMPLANT
SUT PROLENE 2 0 CT2 30 (SUTURE) IMPLANT
SUT PROLENE 2 0 KS (SUTURE) IMPLANT
SUT SILK 2 0 SH CR/8 (SUTURE) ×2 IMPLANT
SUT SILK 2 0 TIES 10X30 (SUTURE) ×2 IMPLANT
SUT SILK 3 0 SH CR/8 (SUTURE) ×2 IMPLANT
SUT SILK 3 0 TIES 10X30 (SUTURE) ×2 IMPLANT
SUT VIC AB 0 CT1 27 (SUTURE) ×1
SUT VIC AB 0 CT1 27XBRD ANBCTR (SUTURE) ×1 IMPLANT
SUT VIC AB 3-0 SH 18 (SUTURE) ×4 IMPLANT
TRAY FOLEY MTR SLVR 16FR STAT (SET/KITS/TRAYS/PACK) IMPLANT
TUBE CONNECTING 12X1/4 (SUCTIONS) ×4 IMPLANT

## 2019-12-11 NOTE — Anesthesia Preprocedure Evaluation (Addendum)
Anesthesia Evaluation  Patient identified by MRN, date of birth, ID band Patient awake    Reviewed: Allergy & Precautions, NPO status , Patient's Chart, lab work & pertinent test results, reviewed documented beta blocker date and time   History of Anesthesia Complications Negative for: history of anesthetic complications  Airway Mallampati: III  TM Distance: >3 FB Neck ROM: Full    Dental no notable dental hx.    Pulmonary COPD, former smoker, PE (03/2019, Eliquis being held)   Pulmonary exam normal        Cardiovascular hypertension, Pt. on home beta blockers and Pt. on medications Normal cardiovascular exam     Neuro/Psych negative neurological ROS  negative psych ROS   GI/Hepatic Neg liver ROS, Large bowel obstruction   Endo/Other  Hypothyroidism   Renal/GU AKI  negative genitourinary   Musculoskeletal negative musculoskeletal ROS (+)   Abdominal   Peds  Hematology  (+) anemia , Hgb 8.8   Anesthesia Other Findings Day of surgery medications reviewed with patient.  Reproductive/Obstetrics Metastatic ovarian ca s/p debulking                            Anesthesia Physical Anesthesia Plan  ASA: III  Anesthesia Plan: General   Post-op Pain Management:    Induction: Intravenous, Cricoid pressure planned and Rapid sequence  PONV Risk Score and Plan: 4 or greater and Treatment may vary due to age or medical condition, Ondansetron and Dexamethasone  Airway Management Planned: Oral ETT  Additional Equipment: None  Intra-op Plan:   Post-operative Plan: Extubation in OR  Informed Consent: I have reviewed the patients History and Physical, chart, labs and discussed the procedure including the risks, benefits and alternatives for the proposed anesthesia with the patient or authorized representative who has indicated his/her understanding and acceptance.     Dental advisory  given  Plan Discussed with: CRNA  Anesthesia Plan Comments:        Anesthesia Quick Evaluation

## 2019-12-11 NOTE — Progress Notes (Signed)
Subjective: Ms. Gabor was seen post op today. She was feeling well. She reported some continued abdominal pain but stated that someone was going to get her something for it. She has no other complaints.  Consults: surgery, palliative  Objective:  Vital signs in last 24 hours: Vitals:   12/11/19 1209 12/11/19 1224 12/11/19 1239 12/11/19 1254  BP: (!) 173/83 (!) 145/73 114/72 137/76  Pulse: (!) 107 (!) 104 (!) 103 (!) 103  Resp: (!) 23 18 18 18   Temp:      TempSrc:      SpO2: 92% 92% 92% 92%  Weight:      Height:       Physical Exam  Constitutional: No distress.  Chronically ill appearing female  Cardiovascular: Regular rhythm and intact distal pulses.  No murmur heard. tachycardic  Pulmonary/Chest: Effort normal. No respiratory distress.  Abdominal: She exhibits no distension. There is abdominal tenderness.  Ostomy in place with significant brown output  Neurological: She is alert.  Skin: There is pallor.  Nursing note and vitals reviewed.  I/Os:  Intake/Output Summary (Last 24 hours) at 12/11/2019 1257 Last data filed at 12/11/2019 1244 Gross per 24 hour  Intake 2452.28 ml  Output 1775 ml  Net 677.28 ml   Labs: Results for orders placed or performed during the hospital encounter of 12/09/19 (from the past 24 hour(s))  Lactic acid, plasma     Status: None   Collection Time: 12/10/19  3:03 PM  Result Value Ref Range   Lactic Acid, Venous 0.8 0.5 - 1.9 mmol/L  Comprehensive metabolic panel     Status: Abnormal   Collection Time: 12/10/19  3:03 PM  Result Value Ref Range   Sodium 143 135 - 145 mmol/L   Potassium 2.6 (LL) 3.5 - 5.1 mmol/L   Chloride 106 98 - 111 mmol/L   CO2 25 22 - 32 mmol/L   Glucose, Bld 90 70 - 99 mg/dL   BUN 12 8 - 23 mg/dL   Creatinine, Ser 1.02 (H) 0.44 - 1.00 mg/dL   Calcium 9.5 8.9 - 10.3 mg/dL   Total Protein 5.9 (L) 6.5 - 8.1 g/dL   Albumin 2.6 (L) 3.5 - 5.0 g/dL   AST 12 (L) 15 - 41 U/L   ALT 11 0 - 44 U/L   Alkaline  Phosphatase 55 38 - 126 U/L   Total Bilirubin 0.7 0.3 - 1.2 mg/dL   GFR calc non Af Amer 54 (L) >60 mL/min   GFR calc Af Amer >60 >60 mL/min   Anion gap 12 5 - 15  CBC with Differential/Platelet     Status: Abnormal   Collection Time: 12/11/19  4:04 AM  Result Value Ref Range   WBC 9.7 4.0 - 10.5 K/uL   RBC 2.77 (L) 3.87 - 5.11 MIL/uL   Hemoglobin 8.8 (L) 12.0 - 15.0 g/dL   HCT 26.5 (L) 36.0 - 46.0 %   MCV 95.7 80.0 - 100.0 fL   MCH 31.8 26.0 - 34.0 pg   MCHC 33.2 30.0 - 36.0 g/dL   RDW 18.8 (H) 11.5 - 15.5 %   Platelets 490 (H) 150 - 400 K/uL   nRBC 0.2 0.0 - 0.2 %   Neutrophils Relative % 51 %   Neutro Abs 4.8 1.7 - 7.7 K/uL   Lymphocytes Relative 24 %   Lymphs Abs 2.4 0.7 - 4.0 K/uL   Monocytes Relative 22 %   Monocytes Absolute 2.1 (H) 0.1 - 1.0 K/uL   Eosinophils  Relative 1 %   Eosinophils Absolute 0.1 0.0 - 0.5 K/uL   Basophils Relative 1 %   Basophils Absolute 0.1 0.0 - 0.1 K/uL   Immature Granulocytes 1 %   Abs Immature Granulocytes 0.10 (H) 0.00 - 0.07 K/uL  Surgical PCR screen     Status: None   Collection Time: 12/11/19  4:52 AM   Specimen: Nasal Mucosa; Nasal Swab  Result Value Ref Range   MRSA, PCR NEGATIVE NEGATIVE   Staphylococcus aureus NEGATIVE NEGATIVE  Comprehensive metabolic panel     Status: Abnormal   Collection Time: 12/11/19  7:54 AM  Result Value Ref Range   Sodium 146 (H) 135 - 145 mmol/L   Potassium 3.3 (L) 3.5 - 5.1 mmol/L   Chloride 112 (H) 98 - 111 mmol/L   CO2 22 22 - 32 mmol/L   Glucose, Bld 77 70 - 99 mg/dL   BUN 13 8 - 23 mg/dL   Creatinine, Ser 1.07 (H) 0.44 - 1.00 mg/dL   Calcium 8.9 8.9 - 10.3 mg/dL   Total Protein 6.1 (L) 6.5 - 8.1 g/dL   Albumin 2.6 (L) 3.5 - 5.0 g/dL   AST 15 15 - 41 U/L   ALT 10 0 - 44 U/L   Alkaline Phosphatase 54 38 - 126 U/L   Total Bilirubin 0.6 0.3 - 1.2 mg/dL   GFR calc non Af Amer 51 (L) >60 mL/min   GFR calc Af Amer 59 (L) >60 mL/min   Anion gap 12 5 - 15  Magnesium     Status: Abnormal    Collection Time: 12/11/19  7:54 AM  Result Value Ref Range   Magnesium 2.8 (H) 1.7 - 2.4 mg/dL  Type and screen [place]     Status: None   Collection Time: 12/11/19  9:37 AM  Result Value Ref Range   ABO/RH(D) A POS    Antibody Screen NEG    Sample Expiration      12/14/2019,2359 Performed at [facility], [address], [place], [place] [address]     Assessment/Plan:  Assessment: Ms. Minion is a 73 yo F w/ a PMHx significant for COPD, HTN, hx of PE and metastatic ovarian cancer s/p TAH BSO in 2018 on chemotherapy who presented with a few weeks of increasing abdominal distention and new onset nausea and vomiting found to have a large bowel obstruction with abrupt transition point in the LUQ s/p surgery with ostomy creation.  Plan:  Active Problems:  Large bowel obstruction (HCC) -pt with known metastatic ovarian cancer s/p TAH BSO in 2018 currently on chemotherapy here with large bowel obstruction with abrupt transition point in LUQ now s/p surgery with ostomy creation -awaiting surgery findings  -broached topic of palliative care consult with patient and she is amenable to speaking with palliative today  Plan: -fu surgery findings/recs -fu palliative recs -maintain NG  Metastatic ovarian cancer: -pt with known metastatic ovarian cancer s/p TAH BSO in 2018 currently on chemotherapy here with large bowel obstruction s/p surgery and ostomy creation -unclear the cause, awaiting surgical findings  -gyn onc and palliative consulted  Plan: -gyn onc recommends forgoing tumor debulking given overall prognosis and recommends holding treatment until patient recovered from surgery -fu palliative recs  HTN: -continue home bp meds: amlodipine 5 mg, labetalol 50 mg BID -IV labetolol added for BP >170/100 in case medicines via NG are ineffective  Hx PE: -restart eliquis tomorrow  Dispo: Anticipated discharge pending clinical course.  Madilyn Fireman,  Lyndee Leo,  MD 12/11/2019, 12:57 PM Pager: [phone]

## 2019-12-11 NOTE — Op Note (Signed)
Preoperative diagnosis: colonic obstruction  Postoperative diagnosis: same   Procedure: diverting loop colostomy creation  Surgeon: Gurney Maxin, M.D.  Asst: Kaylyn Lim, M.D.  Anesthesia: general  Indications for procedure: Rhonda Hale is a 73 y.o. year old female with symptoms of abdominal pain, nausea, vomiting. Work up showed a dilated colon with concern for left upper quadrant obstruction.  Description of procedure: The patient was brought into the operative suite. Anesthesia was administered with General endotracheal anesthesia. WHO checklist was applied. The patient was then placed in supine position. The area was prepped and draped in the usual sterile fashion.  Next, an upper midline incision was made. Cautery was used to dissect down through the subcutaneous tissues.  The fascia was encountered and entered sharply without injury.  Immediately we were able to visualize the transverse colon.  Blunt and sharp dissection was used to free the transverse colon away from the abdominal wall.  Moving towards the left side of the patient there is a large focal firmness consistent with mass.  Moving towards the right side of the patient there was additional adhesions that were freed.  On the inferior aspect of the colon there was a loop of jejunum that it adhered adhesions were taken down with sharp dissection.  This allowed the transverse portion of the transverse colon to come out through the incision.  Next, red rubber was were introduced under the colon to allow for a bridge.  Next, the fascia was closed with interrupted 0 PDS.  Skin was also closed with 2-0 nylon interrupted sutures.  The red rubber's were sutured to the skin such that one red rubber moved towards the left side the patient 1 red rubber moving towards the right side the patient.  Tiles were put in place the colon was then opened a large amount of fecal material was evacuated to a total of 750 mL.  Great care was taken to  avoid contamination of the area which was minimized.  Once decompressed the edges of the colotomy were sutured to the skin with small 2 cm brooking using 3-0 Vicryl sutures.  A ostomy appliance was put in place.  Patient woke from anesthesia brought to PACU in stable condition.  All counts were correct.  Findings: dilated colon. Able to bring transverse colon to create ostomy in upper midline area. Red rubbers as bridges and looped to each side (left and right)  Specimen: none  Implant: red rubbers x2, ostomy device   Blood loss: 50 ml  Local anesthesia: none  Complications: none  Gurney Maxin, M.D. General, Bariatric, & Minimally Invasive Surgery [facility]

## 2019-12-11 NOTE — Anesthesia Procedure Notes (Addendum)
Procedure Name: Intubation Date/Time: 12/11/2019 10:16 AM Performed by: Brennan Bailey, MD Pre-anesthesia Checklist: Patient identified, Emergency Drugs available, Suction available and Patient being monitored Patient Re-evaluated:Patient Re-evaluated prior to induction Oxygen Delivery Method: Circle System Utilized Preoxygenation: Pre-oxygenation with 100% oxygen Induction Type: IV induction Laryngoscope Size: Miller and 2 Grade View: Grade II Tube type: Oral Tube size: 7.5 mm Number of attempts: 2 Airway Equipment and Method: Stylet Placement Confirmation: ETT inserted through vocal cords under direct vision,  positive ETCO2 and breath sounds checked- equal and bilateral Secured at: 21 cm Tube secured with: Tape Dental Injury: Teeth and Oropharynx as per pre-operative assessment  Difficulty Due To: Difficulty was unanticipated Future Recommendations: Recommend- induction with short-acting agent, and alternative techniques readily available Comments: RSI. NGT to suction prior to induction. First intubation attempt by CRNA unable to obtain adequate view with [facility] 2. Second attempt by myself with Sabra Heck 2, grade 2B view with cricoid pressure, anterior airway noted. Atraumatic intubation. Daiva Huge, MD

## 2019-12-11 NOTE — Progress Notes (Signed)
Palliative consult received.  Chart reviewed.  Discussed with primary team and Dr. Barry Dienes.  Patient in surgery when I went to the room.  Called Daleen Snook and gave her my name and contact information.  Will follow Ms. Pistone' course with you after surgery.  Florentina Jenny, PA-C Palliative Medicine Office:  [phone]  No charge note.

## 2019-12-11 NOTE — Anesthesia Postprocedure Evaluation (Signed)
Anesthesia Post Note  Patient: Rhonda Hale  Procedure(s) Performed: OPEN DIVERTED COLOSTOMY (N/A Abdomen)     Patient location during evaluation: PACU Anesthesia Type: General Level of consciousness: awake and alert and oriented Pain management: pain level controlled Vital Signs Assessment: post-procedure vital signs reviewed and stable Respiratory status: spontaneous breathing, nonlabored ventilation and respiratory function stable Cardiovascular status: blood pressure returned to baseline Postop Assessment: no apparent nausea or vomiting Anesthetic complications: no    Last Vitals:  Vitals:   12/11/19 1254 12/11/19 1327  BP: 137/76 (!) 110/94  Pulse: (!) 103 (!) 101  Resp: 18 18  Temp:  36.7 C  SpO2: 92% 94%    Last Pain:  Vitals:   12/11/19 1327  TempSrc: Oral  PainSc:                  Brennan Bailey

## 2019-12-11 NOTE — Progress Notes (Signed)
  Date: 12/11/2019  Patient name: Rhonda Hale  Medical record number: [id number]  Date of birth: 1946/01/12        I have seen and evaluated this patient and I have discussed the plan of care with the house staff. Please see Dr. Webb Silversmith note for complete details. I concur with her findings and plan.  Patient seen in PACU after surgery.  Ostomy was draining and patient was alert and oriented. We will continue to follow her for medical needs and follow up with consultants - surgery and palliative care.   Sid Falcon, MD 12/11/2019, 6:47 PM

## 2019-12-11 NOTE — Progress Notes (Signed)
Initial Nutrition Assessment  RD working remotely.  DOCUMENTATION CODES:   Obesity unspecified  INTERVENTION:   -MVI with minerals daily -Once diet is advanced, add Boost Breeze po TID, each supplement provides 250 kcal and 9 grams of protein -RD will follow for diet advancement and supplement diet as appropriate  NUTRITION DIAGNOSIS:   Increased nutrient needs related to cancer and cancer related treatments as evidenced by estimated needs.  GOAL:   Patient will meet greater than or equal to 90% of their needs  MONITOR:   Diet advancement, Labs, Weight trends, Skin, I & O's  REASON FOR ASSESSMENT:   Malnutrition Screening Tool    ASSESSMENT:   Rhonda Hale is a 73 year old female with past medical history of COPD, HTN, hypothyroidism, ovarian cancer diagnosed 2018 s/p total hysterectomy & splenectomy with debulking surgery p/w abdominal pain, nausea, vomiting and found to have colonic obstruction  Pt admitted with colonic obstruction.   12/29- NGT placed to low, intermittent suction  Reviewed I/O's: +1.3 L x 24 hours  UOP: 450 ml x 24 hours  NGT output: 100 ml x 24 hours  Pt in OR at time of visit; plan to undergo palliative colostomy today.   Per chart review, pt endorses a 20 pound weight loss over the past year. She has been experiencing vomiting of worsening frequency over the past 2 weeks. Of note, pt's oncologist is at [facility] in [place] and has re-started chemotherapy in 2019. Pt recently moved to the [place] area in June after a fall.   Pt currently NPO. She desires to eat, but is understanding of NPO status.   Reviewed wt hx from Care Everywhere (wt of 185# on 02/13/19); pt has experienced a 10.8% wt loss over the past 9 months. While this is not significant for time frame, this is concerning given advanced cancer diagnosis, undergoing chemotherapy, and history of decreased appetite.   Palliative care consulted for further goals of care  discussions.   Medications reviewed and include lactated ringers infusion @ 10 ml/hr.   Labs reviewed: K: 2.6 (on IV supplementation), Mg: 1.1.   Diet Order:   Diet Order            Diet NPO time specified Except for: Ice Chips, Sips with Meds  Diet effective ____        Diet NPO time specified  Diet effective now              EDUCATION NEEDS:   No education needs have been identified at this time  Skin:  Skin Assessment: Reviewed RN Assessment  Last BM:  12/06/19  Height:   Ht Readings from Last 1 Encounters:  12/11/19 5' 2.01" (1.575 m)    Weight:   Wt Readings from Last 1 Encounters:  12/11/19 74.8 kg    Ideal Body Weight:  50 kg  BMI:  Body mass index is 30.15 kg/m.  Estimated Nutritional Needs:   Kcal:  1800-2000  Protein:  100-115 grams  Fluid:  > 1.8 L    Ceceilia Cephus A. Jimmye Norman, RD, LDN, [place] Registered Dietitian II Certified Diabetes Care and Education Specialist Pager: [phone] After hours Pager: [phone]

## 2019-12-11 NOTE — Progress Notes (Signed)
Progress Note: General Surgery Service   Chief Complaint/Subjective: No flatus or BM overnight, after discussions with the team, the patient and the family would like to pursue colostomy creation  Objective: Vital signs in last 24 hours: Temp:  [98.1 F (36.7 C)-98.5 F (36.9 C)] 98.3 F (36.8 C) (12/30 0600) Pulse Rate:  [95-107] 105 (12/30 0600) Resp:  [15-20] 16 (12/30 0600) BP: (107-152)/(65-79) 152/79 (12/30 0600) SpO2:  [93 %-97 %] 93 % (12/30 0816) Weight:  [74.8 kg] 74.8 kg (12/30 0700) Last BM Date: 12/06/19  Intake/Output from previous day: 12/29 0701 - 12/30 0700 In: 2152.3 [I.V.:1594.4; IV Piggyback:557.9] Out: 850 [Urine:450; Emesis/NG output:100] Intake/Output this shift: No intake/output data recorded.  Gen: NAD  Resp: nonlabored  Card: tachycardic  Abd: firm, distended, nontender  Lab Results: CBC  Recent Labs    12/09/19 1626 12/11/19 0404  WBC 10.3 9.7  HGB 10.5* 8.8*  HCT 31.4* 26.5*  PLT 542* 490*   BMET Recent Labs    12/09/19 1626 12/10/19 1030 12/10/19 1503  NA 140  --  143  K 3.1*  --  2.6*  CL 102  --  106  CO2 25  --  25  GLUCOSE 120*  --  90  BUN 13  --  12  CREATININE 1.08* 0.91 1.02*  CALCIUM 10.6*  --  9.5   PT/INR No results for input(s): LABPROT, INR in the last 72 hours. ABG No results for input(s): PHART, HCO3 in the last 72 hours.  Invalid input(s): PCO2, PO2  Anti-infectives: Anti-infectives (From admission, onward)   None      Medications: Scheduled Meds: . amLODipine  5 mg Per Tube Daily  . Chlorhexidine Gluconate Cloth  6 each Topical Daily  . enoxaparin (LOVENOX) injection  40 mg Subcutaneous Q24H  . labetalol  50 mg Per Tube BID  . levothyroxine  75 mcg Intravenous Daily  . umeclidinium bromide  1 puff Inhalation Daily   Continuous Infusions: . 0.9 % NaCl with KCl 40 mEq / L 75 mL/hr (12/10/19 2146)  . potassium chloride 10 mEq (12/11/19 0801)   PRN Meds:.acetaminophen **OR** acetaminophen,  ketorolac, labetalol, prochlorperazine, sodium chloride flushy 73 yo female with advanced ovarian cancer that had debulking/TAH/BSO in 2018, has been on chemo intermittently since and presents with colonic obstruction. We discussed options of stent vs colostomy for this. We discussed that this would not change the course of the cancer. We discussed risks of wound infection, deep space infection, pneumonia, heart stress. She showed good understanding and agreed to proceed with plan. -OR for colostomy creation -preop meds and abx  LOS: 1 day   Mickeal Skinner, MD [facility]

## 2019-12-11 NOTE — Transfer of Care (Signed)
Immediate Anesthesia Transfer of Care Note  Patient: Rhonda Hale  Procedure(s) Performed: OPEN DIVERTED COLOSTOMY (N/A Abdomen)  Patient Location: PACU  Anesthesia Type:General  Level of Consciousness: awake  Airway & Oxygen Therapy: Patient Spontanous Breathing  Post-op Assessment: Report given to RN and Post -op Vital signs reviewed and stable  Post vital signs: Reviewed and stable  Last Vitals:  Vitals Value Taken Time  BP 164/82 12/11/19 1154  Temp    Pulse 109 12/11/19 1155  Resp 25 12/11/19 1155  SpO2 95 % 12/11/19 1155  Vitals shown include unvalidated device data.  Last Pain:  Vitals:   12/11/19 0908  TempSrc: Oral  PainSc:          Complications: No apparent anesthesia complications

## 2019-12-12 DIAGNOSIS — Z515 Encounter for palliative care: Secondary | ICD-10-CM

## 2019-12-12 DIAGNOSIS — K56609 Unspecified intestinal obstruction, unspecified as to partial versus complete obstruction: Principal | ICD-10-CM

## 2019-12-12 DIAGNOSIS — C569 Malignant neoplasm of unspecified ovary: Secondary | ICD-10-CM

## 2019-12-12 LAB — COMPREHENSIVE METABOLIC PANEL
ALT: 11 U/L (ref 0–44)
AST: 14 U/L — ABNORMAL LOW (ref 15–41)
Albumin: 2.4 g/dL — ABNORMAL LOW (ref 3.5–5.0)
Alkaline Phosphatase: 56 U/L (ref 38–126)
Anion gap: 12 (ref 5–15)
BUN: 15 mg/dL (ref 8–23)
CO2: 20 mmol/L — ABNORMAL LOW (ref 22–32)
Calcium: 8.1 mg/dL — ABNORMAL LOW (ref 8.9–10.3)
Chloride: 116 mmol/L — ABNORMAL HIGH (ref 98–111)
Creatinine, Ser: 1.08 mg/dL — ABNORMAL HIGH (ref 0.44–1.00)
GFR calc Af Amer: 58 mL/min — ABNORMAL LOW (ref >60)
GFR calc non Af Amer: 50 mL/min — ABNORMAL LOW (ref >60)
Glucose, Bld: 132 mg/dL — ABNORMAL HIGH (ref 70–99)
Potassium: 3.5 mmol/L (ref 3.5–5.1)
Sodium: 148 mmol/L — ABNORMAL HIGH (ref 135–145)
Total Bilirubin: 0.6 mg/dL (ref 0.3–1.2)
Total Protein: 5.8 g/dL — ABNORMAL LOW (ref 6.5–8.1)

## 2019-12-12 LAB — CBC
HCT: 25.1 % — ABNORMAL LOW (ref 36.0–46.0)
Hemoglobin: 8.1 g/dL — ABNORMAL LOW (ref 12.0–15.0)
MCH: 31.4 pg (ref 26.0–34.0)
MCHC: 32.3 g/dL (ref 30.0–36.0)
MCV: 97.3 fL (ref 80.0–100.0)
Platelets: 503 10*3/uL — ABNORMAL HIGH (ref 150–400)
RBC: 2.58 MIL/uL — ABNORMAL LOW (ref 3.87–5.11)
RDW: 19.5 % — ABNORMAL HIGH (ref 11.5–15.5)
WBC: 14 10*3/uL — ABNORMAL HIGH (ref 4.0–10.5)
nRBC: 0.1 % (ref 0.0–0.2)

## 2019-12-12 LAB — MAGNESIUM: Magnesium: 1.7 mg/dL (ref 1.7–2.4)

## 2019-12-12 MED ORDER — APIXABAN 5 MG PO TABS
5.0000 mg | ORAL_TABLET | Freq: Two times a day (BID) | ORAL | Status: DC
  Administered 2019-12-12 – 2019-12-17 (×11): 5 mg via ORAL
  Filled 2019-12-12 (×11): qty 1

## 2019-12-12 MED ORDER — LEVOTHYROXINE SODIUM 75 MCG PO TABS
150.0000 ug | ORAL_TABLET | Freq: Every day | ORAL | Status: DC
  Administered 2019-12-13 – 2019-12-17 (×5): 150 ug via ORAL
  Filled 2019-12-12 (×5): qty 2

## 2019-12-12 NOTE — Progress Notes (Signed)
PT Cancellation Note  Patient Details Name: Rhonda Hale MRN: [id number] DOB: 08-12-1946   Cancelled Treatment:    Reason Eval/Treat Not Completed: Patient at procedure or test/unavailable. PT attempted to see patient for eval however upon arrival pt with other discipline. PT will return as time allows.   Zenaida Niece 12/12/2019, 5:36 PM

## 2019-12-12 NOTE — Progress Notes (Signed)
Subjective: Patient quite distressed this morning. She reports that her ostomy bag burst and she was covered in stool. She was particularly distressed as her daughter was not with her to help. We discussed that her NG would likely be able to be dc'ed today and that she could start drinking minimally. She was looking forward to that.  Consults: surgery, palliative  Objective:  Vital signs in last 24 hours: Vitals:   12/12/19 0141 12/12/19 0340 12/12/19 0529 12/12/19 0726  BP: (!) 119/56  132/60 (!) 152/71  Pulse: 96  98 90  Resp: 18  20   Temp: 98.9 F (37.2 C)  98.6 F (37 C) 98.6 F (37 C)  TempSrc: Oral  Oral Oral  SpO2: 94%  95% 98%  Weight:  76 kg    Height:       Physical Exam  Constitutional: She is oriented to person, place, and time.  Chronically ill appearing female  Cardiovascular: Regular rhythm and intact distal pulses.  No murmur heard. tachycardic  Pulmonary/Chest: Effort normal. No respiratory distress.  Abdominal: She exhibits no distension. There is no abdominal tenderness.  Ostomy in place with minimal brown output  Neurological: She is alert and oriented to person, place, and time.  Skin: She is not diaphoretic. There is pallor.  Psychiatric:  anxious  Nursing note and vitals reviewed.  I/Os:  Intake/Output Summary (Last 24 hours) at 12/12/2019 1104 Last data filed at 12/12/2019 0500 Gross per 24 hour  Intake 2392.4 ml  Output 2300 ml  Net 92.4 ml   Labs: Results for orders placed or performed during the hospital encounter of 12/09/19 (from the past 24 hour(s))  Comprehensive metabolic panel     Status: Abnormal   Collection Time: 12/12/19  8:53 AM  Result Value Ref Range   Sodium 148 (H) 135 - 145 mmol/L   Potassium 3.5 3.5 - 5.1 mmol/L   Chloride 116 (H) 98 - 111 mmol/L   CO2 20 (L) 22 - 32 mmol/L   Glucose, Bld 132 (H) 70 - 99 mg/dL   BUN 15 8 - 23 mg/dL   Creatinine, Ser 1.08 (H) 0.44 - 1.00 mg/dL   Calcium 8.1 (L) 8.9 - 10.3 mg/dL     Total Protein 5.8 (L) 6.5 - 8.1 g/dL   Albumin 2.4 (L) 3.5 - 5.0 g/dL   AST 14 (L) 15 - 41 U/L   ALT 11 0 - 44 U/L   Alkaline Phosphatase 56 38 - 126 U/L   Total Bilirubin 0.6 0.3 - 1.2 mg/dL   GFR calc non Af Amer 50 (L) >60 mL/min   GFR calc Af Amer 58 (L) >60 mL/min   Anion gap 12 5 - 15  CBC     Status: Abnormal   Collection Time: 12/12/19  8:53 AM  Result Value Ref Range   WBC 14.0 (H) 4.0 - 10.5 K/uL   RBC 2.58 (L) 3.87 - 5.11 MIL/uL   Hemoglobin 8.1 (L) 12.0 - 15.0 g/dL   HCT 25.1 (L) 36.0 - 46.0 %   MCV 97.3 80.0 - 100.0 fL   MCH 31.4 26.0 - 34.0 pg   MCHC 32.3 30.0 - 36.0 g/dL   RDW 19.5 (H) 11.5 - 15.5 %   Platelets 503 (H) 150 - 400 K/uL   nRBC 0.1 0.0 - 0.2 %  Magnesium     Status: None   Collection Time: 12/12/19  8:53 AM  Result Value Ref Range   Magnesium 1.7 1.7 - 2.4  mg/dL    Assessment/Plan:  Assessment: Ms. Akpan is a 73 yo F w/ a PMHx significant for COPD, HTN, hx of PE and metastatic ovarian cancer s/p TAH BSO in 2018 on chemotherapy who presented with a few weeks of increasing abdominal distention and new onset nausea and vomiting found to have a large bowel obstruction with abrupt transition point in the LUQ s/p surgery with ostomy creation.  Plan:  Active Problems:  Large bowel obstruction (HCC) -pt with known metastatic ovarian cancer s/p TAH BSO in 2018 currently on chemotherapy here with large bowel obstruction with abrupt transition point in LUQ now s/p surgery with ostomy creation -broached topic of palliative care consult with patient and she is amenable to speaking with palliative today -surgery saw patient this am and recommends dc'ing NG and NPO diet except sips and chips  -PT/OT consulted  Plan: -dc NG -NPO except sips and chips -fu palliative recs -fu PT/OT recs  Metastatic ovarian cancer: -pt with known metastatic ovarian cancer s/p TAH BSO in 2018 currently on chemotherapy here with large bowel obstruction s/p surgery and  ostomy creation -gyn onc and palliative consulted -PT/OT consulted  Plan: -gyn onc recommends holding further onc treatment until patient recovered from surgery -fu palliative recs -fu PT/OT recs  HTN: -continue home bp meds: amlodipine 5 mg, labetalol 50 mg BID  Hx PE: -restart eliquis as patient no longer has NG  Dispo: Anticipated discharge pending clinical course.  Al Decant, MD 12/12/2019, 11:04 AM Pager: [phone]

## 2019-12-12 NOTE — Progress Notes (Signed)
1 Day Post-Op loop diverting colostomy creation Subjective: No specific complaints  Objective: Vital signs in last 24 hours: Temp:  [97.9 F (36.6 C)-98.9 F (37.2 C)] 98.6 F (37 C) (12/31 0726) Pulse Rate:  [90-110] 90 (12/31 0726) Resp:  [16-23] 20 (12/31 0529) BP: (110-173)/(56-94) 152/71 (12/31 0726) SpO2:  [92 %-98 %] 98 % (12/31 0726) Weight:  [76 kg] 76 kg (12/31 0340)   Intake/Output from previous day: 12/30 0701 - 12/31 0700 In: 2492.4 [P.O.:40; I.V.:1952.4; IV Piggyback:500] Out: 2350 [Urine:575; Emesis/NG output:100; Stool:875; Blood:50] Intake/Output this shift: No intake/output data recorded.   General appearance: alert and cooperative Resp: nonlabored GI: normal findings: soft, nondistended.  ostomy slightly dusky and edematous, red rubber in place NG with clear output Incision: no significant erythema  Lab Results:  Recent Labs    12/09/19 1626 12/11/19 0404  WBC 10.3 9.7  HGB 10.5* 8.8*  HCT 31.4* 26.5*  PLT 542* 490*   BMET Recent Labs    12/10/19 1503 12/11/19 0754  NA 143 146*  K 2.6* 3.3*  CL 106 112*  CO2 25 22  GLUCOSE 90 77  BUN 12 13  CREATININE 1.02* 1.07*  CALCIUM 9.5 8.9   PT/INR No results for input(s): LABPROT, INR in the last 72 hours. ABG No results for input(s): PHART, HCO3 in the last 72 hours.  Invalid input(s): PCO2, PO2  MEDS, Scheduled . amLODipine  5 mg Per Tube Daily  . Chlorhexidine Gluconate Cloth  6 each Topical Daily  . enoxaparin (LOVENOX) injection  40 mg Subcutaneous Q24H  . feeding supplement  1 Container Oral TID BM  . labetalol  50 mg Per Tube BID  . levothyroxine  75 mcg Intravenous Daily  . multivitamin with minerals  1 tablet Oral Daily  . umeclidinium bromide  1 puff Inhalation Daily    Studies/Results: No results found.  Assessment: s/p Procedure(s): OPEN DIVERTED COLOSTOMY Patient Active Problem List   Diagnosis Date Noted  . Colonic obstruction ([place]) 12/10/2019  . Adrenal  insufficiency ([place]) 09/05/2019  . Hypotension 08/21/2019  . Toxic metabolic encephalopathy 0000000  . Hyponatremia 07/31/2019  . AMS (altered mental status) 07/27/2019  . Dehydration 07/26/2019  . PRES (posterior reversible encephalopathy syndrome) 07/07/2019  . AKI (acute kidney injury) ([place]) 07/03/2019  . Hypercalcemia 07/03/2019  . Hypomagnesemia, chronic 07/03/2019  . Anticoagulated 04/15/2019  . Ovary cancer ([place]) 07/03/2017  . Postoperative hypothyroidism 01/01/2013    Expected post op course  Plan: d/c NG May have sips of clears  Eakins pouch to midline ostomy site    LOS: 2 days     .Rosario Adie, [facility], [place]    12/12/2019 8:45 AM

## 2019-12-12 NOTE — Consult Note (Signed)
Consultation Note Date: 12/12/2019   Patient Name: Rhonda Hale  DOB: January 04, 1946  MRN: [id number]  Age / Sex: 73 y.o., female  PCP: Marianna Payment, MD Referring Physician: Lucious Groves, DO  Reason for Consultation: Establishing goals of care  HPI/Patient Profile: 73 y.o. female  with past medical history of ovarian cancer s/p  TAH BSO on chemotherapy who was admitted on 12/09/2019 with massive abdominal distention and pain.  Imaging revealed a large bowel obstruction that was not amenable to conservative therapy.  She underwent surgery  and a colostomy was placed on 12/30.  Clinical Assessment and Goals of Care:  I have reviewed medical records including EPIC notes, labs and imaging, received report from the care team, assessed the patient and then met at the bedside along with the patient and her daughter Daleen Snook  to discuss diagnosis prognosis, [place], EOL wishes, disposition and options.  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  I introduced Palliative Medicine as specialized medical care for people living with serious illness. It focuses on providing relief from the symptoms and stress of a serious illness. The goal is to improve quality of life for both the patient and the family.  We discussed a brief life review of the patient.  Mrs. Oros grew up in [place].  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  She has two daughters, Daleen Snook and Raquel Sarna.  The three of them are very close.  Mrs. Franzen has been living with Daleen Snook recently as she realizes that she needs assistance.  Mrs. Nembhard is a spiritual person.  She was raised as an Engineer, maintenance (IT) and was active in church when she was living in [place].  As far as functional and nutritional status she remains ambulatory and independent with her ADLs.  Her  diet will slowly be progressed after surgery.  I attempted to elicit values and goals of care important to the patient.  It is important to Mrs. Chaudhari to remain independent "I need to do my own laundry".  She greatly valued living in [place] but now seems to accept that She will be living with her daughters.  Her family and children are tremendously important to her she wants to be able to hug them and engage with them.    We broached the topic of goals of care and code status/life support.  Daleen Snook explained that Mrs. Markie enjoys a good quality of life.  If something happened very acutely that was resolvable they would want to "Code" Mrs. Sylvia and give her a chance to resume her current life.  However if something happened that caused her to arrest that was not resolvable (progression of cancer, massive stroke) they would not want to code Mrs. Ysidro Evert.  Consequently for the time being it makes sense to the family that she remain a full code.  Mrs. Gundy listens and agrees with this conversation.  Hospice and Palliative Care services outpatient  were explained and offered.  The family is open to receiving Palliative follow up in their home.   Questions and concerns were addressed. The family was encouraged to call with questions or concerns.    Primary Decision Maker:  PATIENT.  Her daughter Daleen Snook is her HCPOA    SUMMARY OF RECOMMENDATIONS    I feel confident that the family understands Mrs. Fatzinger health status and will make intelligent reasonable decisions regarding goals of care and code status as her illness progresses.  Recommend Palliative Care follow her outpatient along with home health services (PT/OT/RN).  Patient has questions and life style adjustments to make with her colostomy.  Code Status/Advance Care Planning:  Full   Symptom Management:   Per primary  Additional Recommendations (Limitations, Scope, Preferences):  No Tracheostomy, No long term life  support.  Palliative Prophylaxis:   Bowel Regimen  Psycho-social/Spiritual:   Desire for further Chaplaincy support: welcomed.  Prognosis:   Unable to determine.  Will defer to oncology.  Discharge Planning: Home with Home Health and Palliative      Primary Diagnoses: Present on Admission: . Colonic obstruction ([place]) . Ovary cancer ([place])   I have reviewed the medical record, interviewed the patient and family, and examined the patient. The following aspects are pertinent.  Past Medical History:  Diagnosis Date  . Cancer ([place])   . COPD (chronic obstructive pulmonary disease) ([place])   . Hypertension   . PRES (posterior reversible encephalopathy syndrome)    Social History   Socioeconomic History  . Marital status: Divorced    Spouse name: Not on file  . Number of children: 2  . Years of education: Not on file  . Highest education level: Some college, no degree  Occupational History  . Not on file  Tobacco Use  . Smoking status: Former Smoker    Years: 20.00    Types: Cigarettes  . Smokeless tobacco: Never Used  . Tobacco comment: quit yrs ago  Substance and Sexual Activity  . Alcohol use: Not Currently  . Drug use: Never  . Sexual activity: Not on file  Other Topics Concern  . Not on file  Social History Narrative   Lives with daughter   Caffeine- 1 serving a day   Social Determinants of Health   Financial Resource Strain:   . Difficulty of Paying Living Expenses: Not on file  Food Insecurity:   . Worried About Charity fundraiser in the Last Year: Not on file  . Ran Out of Food in the Last Year: Not on file  Transportation Needs:   . Lack of Transportation (Medical): Not on file  . Lack of Transportation (Non-Medical): Not on file  Physical Activity:   . Days of Exercise per Week: Not on file  . Minutes of Exercise per Session: Not on file  Stress:   . Feeling of Stress : Not on file  Social Connections:   . Frequency of Communication with  Friends and Family: Not on file  . Frequency of Social Gatherings with Friends and Family: Not on file  . Attends Religious Services: Not on file  . Active Member of Clubs or Organizations: Not on file  . Attends Archivist Meetings: Not on file  . Marital Status: Not on file   History reviewed. No pertinent family history. Scheduled Meds: . amLODipine  5 mg Per Tube Daily  . apixaban  5 mg Oral BID  . Chlorhexidine Gluconate Cloth  6 each Topical Daily  .  feeding supplement  1 Container Oral TID BM  . labetalol  50 mg Per Tube BID  . [START ON 12/13/2019] levothyroxine  150 mcg Oral Q0600  . multivitamin with minerals  1 tablet Oral Daily  . umeclidinium bromide  1 puff Inhalation Daily   Continuous Infusions: . 0.9 % NaCl with KCl 40 mEq / L 75 mL/hr (12/12/19 1655)   PRN Meds:.acetaminophen **OR** acetaminophen, ketorolac, morphine injection, oxyCODONE, prochlorperazine, sodium chloride flush Allergies  Allergen Reactions  . Atorvastatin Other (See Comments)    Severe heartburn, reflux   Review of Systems Abdominal pain and distention much improved.  Patient complains of insomnia. Daleen Snook discusses her mild hospital delirium.  Physical Exam  Well developed very pleasant female, awake, alert, mildly confused. CV rrr no m/r/g Resp CTA no pain with inspiration Bowel sounds are good.  Vital Signs: BP (!) 125/57 (BP Location: Right Arm)   Pulse 86   Temp 98.4 F (36.9 C) (Oral)   Resp 20   Ht 5' 2.01" (1.575 m)   Wt 76 kg   SpO2 96%   BMI 30.64 kg/m  Pain Scale: 0-10   Pain Score: Asleep   SpO2: SpO2: 96 % O2 Device:SpO2: 96 % O2 Flow Rate: .   IO: Intake/output summary:   Intake/Output Summary (Last 24 hours) at 12/12/2019 1930 Last data filed at 12/12/2019 1735 Gross per 24 hour  Intake 1017.31 ml  Output 1500 ml  Net -482.69 ml    LBM: Last BM Date: 12/06/19 Baseline Weight: Weight: 74.8 kg Most recent weight: Weight: 76 kg     Palliative  Assessment/Data: 50%     Time In: 4:00 Time Out: 5:00 Time Total: 60 min. Visit consisted of counseling and education dealing with the complex and emotionally intense issues surrounding the need for palliative care and symptom management in the setting of serious and potentially life-threatening illness. Greater than 50%  of this time was spent counseling and coordinating care related to the above assessment and plan.  Signed by: Florentina Jenny, PA-C Palliative Medicine Pager: [phone]  Please contact Palliative Medicine Team phone at [phone] for questions and concerns.  For individual provider: See Shea Evans

## 2019-12-12 NOTE — Plan of Care (Signed)
  Problem: Safety: Goal: Ability to remain free from injury will improve Outcome: Progressing   Problem: Skin Integrity: Goal: Risk for impaired skin integrity will decrease Outcome: Progressing   Problem: Pain Managment: Goal: General experience of comfort will improve Outcome: Progressing   

## 2019-12-12 NOTE — Progress Notes (Signed)
Responded to need for DBIV. Pt care being performed. VAST will return at later time.

## 2019-12-12 NOTE — Progress Notes (Signed)
ANTICOAGULATION CONSULT NOTE - Initial Consult  Pharmacy Consult for:  Restart Apixaban Indication: h/o PE  Allergies  Allergen Reactions  . Atorvastatin Other (See Comments)    Severe heartburn, reflux    Patient Measurements: Height: 5' 2.01" (157.5 cm) Weight: 167 lb 8.8 oz (76 kg) IBW/kg (Calculated) : 50.12 Heparin Dosing Weight: 66.7 kg  Vital Signs: Temp: 98.6 F (37 C) (12/31 0726) Temp Source: Oral (12/31 0726) BP: 152/71 (12/31 0726) Pulse Rate: 90 (12/31 0726)  Labs: Recent Labs    12/09/19 1626 12/10/19 1030 12/10/19 1503 12/11/19 0404 12/11/19 0754  HGB 10.5*  --   --  8.8*  --   HCT 31.4*  --   --  26.5*  --   PLT 542*  --   --  490*  --   CREATININE 1.08* 0.91 1.02*  --  1.07*    Estimated Creatinine Clearance: 43.4 mL/min (A) (by C-G formula based on SCr of 1.07 mg/dL (H)).   Medical History: Past Medical History:  Diagnosis Date  . Cancer ([place])   . COPD (chronic obstructive pulmonary disease) ([place])   . Hypertension   . PRES (posterior reversible encephalopathy syndrome)     Medications:  Medications Prior to Admission  Medication Sig Dispense Refill Last Dose  . acetaminophen (TYLENOL) 325 MG tablet Take 2 tablets (650 mg total) by mouth every 6 (six) hours as needed for mild pain (or Fever >/= 101).   12/08/2019 at Unknown time  . amLODipine (NORVASC) 5 MG tablet Take 1 tablet (5 mg total) by mouth daily. 90 tablet 3 12/09/2019 at Unknown time  . apixaban (ELIQUIS) 5 MG TABS tablet Take 1 tablet (5 mg total) by mouth 2 (two) times daily. 60 tablet 3 12/09/2019 at 1100  . calcitRIOL (ROCALTROL) 0.5 MCG capsule Take 1 capsule (0.5 mcg total) by mouth daily. 30 capsule 0 12/09/2019 at Unknown time  . Calcium Carb-Cholecalciferol (CALCIUM-VITAMIN D) 600-400 MG-UNIT TABS Take 1 tablet by mouth 2 (two) times a day.   12/09/2019 at Unknown time  . escitalopram (LEXAPRO) 20 MG tablet Take 1 tablet (20 mg total) by mouth daily. 30 tablet 0 12/09/2019  at Unknown time  . labetalol (NORMODYNE) 100 MG tablet Take 0.5 tablets (50 mg total) by mouth 2 (two) times daily. 90 tablet 1 12/09/2019 at 1100  . levothyroxine (SYNTHROID) 150 MCG tablet Take 1 tablet (150 mcg total) by mouth daily before breakfast. 90 tablet 1 12/09/2019 at Unknown time  . megestrol (MEGACE) 20 MG tablet Take 2 tablets (40 mg total) by mouth 2 (two) times daily. 60 tablet 0 12/09/2019 at Unknown time  . nitrofurantoin, macrocrystal-monohydrate, (MACROBID) 100 MG capsule Take 100 mg by mouth 2 (two) times daily.   12/09/2019 at Unknown time  . QUEtiapine (SEROQUEL) 25 MG tablet Take 1 tablet (25 mg total) by mouth at bedtime. (Patient taking differently: Take 12.5 mg by mouth at bedtime. ) 90 tablet 1 12/08/2019 at Unknown time  . umeclidinium bromide (INCRUSE ELLIPTA) 62.5 MCG/INH AEPB Inhale 1 puff into the lungs daily. 3 each 3 12/09/2019 at Unknown time  . Multiple Vitamin (MULTIVITAMIN WITH MINERALS) TABS tablet Take 1 tablet by mouth daily. (Patient not taking: Reported on 12/10/2019)   Not Taking at Unknown time   Scheduled:  . amLODipine  5 mg Per Tube Daily  . Chlorhexidine Gluconate Cloth  6 each Topical Daily  . enoxaparin (LOVENOX) injection  40 mg Subcutaneous Q24H  . feeding supplement  1 Container Oral TID  BM  . labetalol  50 mg Per Tube BID  . levothyroxine  75 mcg Intravenous Daily  . multivitamin with minerals  1 tablet Oral Daily  . umeclidinium bromide  1 puff Inhalation Daily    Assessment: 73 y.o female presented 12/28 PM with abd pain, nausea, vomiting,  workup showed dilated colon with concern for left upper quandrant obstruction.  H/o PE (03/2019) on Apixaban 5mg  bid PTA, LD pta taken 12/28>  apixaban held on admit 12/28 pending surgery and placed on lovenox for VTE prophylaxis.  Today POD #1 s/p colostomy 12/30,  Pharmacy consulted 12/31 to restart Apixaban for h/o PE.  Goal of Therapy:  Monitor platelets by anticoagulation protocol: Yes   Plan:   Discontinue lovenox vte prophylaxis Restart Apixaban 5 mg BID, PTA dose Monitor for signs and symptoms of bleeding.  Nicole Cella, RPh Clinical Pharmacist Please check AMION for all [place] phone numbers After 10:00 PM, call [place] [phone] 12/12/2019,8:44 AM

## 2019-12-13 ENCOUNTER — Encounter (HOSPITAL_COMMUNITY): Payer: Self-pay | Admitting: Internal Medicine

## 2019-12-13 LAB — BASIC METABOLIC PANEL
Anion gap: 11 (ref 5–15)
BUN: 10 mg/dL (ref 8–23)
CO2: 21 mmol/L — ABNORMAL LOW (ref 22–32)
Calcium: 7.8 mg/dL — ABNORMAL LOW (ref 8.9–10.3)
Chloride: 117 mmol/L — ABNORMAL HIGH (ref 98–111)
Creatinine, Ser: 0.73 mg/dL (ref 0.44–1.00)
GFR calc Af Amer: >60 mL/min (ref >60)
GFR calc non Af Amer: >60 mL/min (ref >60)
Glucose, Bld: 93 mg/dL (ref 70–99)
Potassium: 3.2 mmol/L — ABNORMAL LOW (ref 3.5–5.1)
Sodium: 149 mmol/L — ABNORMAL HIGH (ref 135–145)

## 2019-12-13 LAB — CBC
HCT: 23.2 % — ABNORMAL LOW (ref 36.0–46.0)
Hemoglobin: 7.7 g/dL — ABNORMAL LOW (ref 12.0–15.0)
MCH: 31.8 pg (ref 26.0–34.0)
MCHC: 33.2 g/dL (ref 30.0–36.0)
MCV: 95.9 fL (ref 80.0–100.0)
Platelets: 443 10*3/uL — ABNORMAL HIGH (ref 150–400)
RBC: 2.42 MIL/uL — ABNORMAL LOW (ref 3.87–5.11)
RDW: 19.5 % — ABNORMAL HIGH (ref 11.5–15.5)
WBC: 12.6 10*3/uL — ABNORMAL HIGH (ref 4.0–10.5)
nRBC: 0.3 % — ABNORMAL HIGH (ref 0.0–0.2)

## 2019-12-13 NOTE — Progress Notes (Signed)
2 Days Post-Op loop diverting colostomy creation Subjective: No specific complaints. Ready to eat  Objective: Vital signs in last 24 hours: Temp:  [98.2 F (36.8 C)-98.8 F (37.1 C)] 98.2 F (36.8 C) (01/01 0353) Pulse Rate:  [75-86] 75 (01/01 0353) Resp:  [17-18] 17 (01/01 0353) BP: (122-136)/(57-59) 122/58 (01/01 0353) SpO2:  [96 %-98 %] 98 % (01/01 0353)   Intake/Output from previous day: 12/31 0701 - 01/01 0700 In: 200 [P.O.:200] Out: 1925 [Urine:1150; Stool:775] Intake/Output this shift: No intake/output data recorded.   General appearance: alert and cooperative Resp: nonlabored GI: normal findings: soft, nondistended.  ostomy slightly retracted, red rubber in place Incision: no significant erythema  Lab Results:  Recent Labs    12/12/19 0853 12/13/19 0748  WBC 14.0* 12.6*  HGB 8.1* 7.7*  HCT 25.1* 23.2*  PLT 503* 443*   BMET Recent Labs    12/11/19 0754 12/12/19 0853  NA 146* 148*  K 3.3* 3.5  CL 112* 116*  CO2 22 20*  GLUCOSE 77 132*  BUN 13 15  CREATININE 1.07* 1.08*  CALCIUM 8.9 8.1*   PT/INR No results for input(s): LABPROT, INR in the last 72 hours. ABG No results for input(s): PHART, HCO3 in the last 72 hours.  Invalid input(s): PCO2, PO2  MEDS, Scheduled . amLODipine  5 mg Per Tube Daily  . apixaban  5 mg Oral BID  . Chlorhexidine Gluconate Cloth  6 each Topical Daily  . feeding supplement  1 Container Oral TID BM  . labetalol  50 mg Per Tube BID  . levothyroxine  150 mcg Oral Q0600  . multivitamin with minerals  1 tablet Oral Daily  . umeclidinium bromide  1 puff Inhalation Daily    Studies/Results: No results found.  Assessment: s/p Procedure(s): OPEN DIVERTED COLOSTOMY Patient Active Problem List   Diagnosis Date Noted  . Palliative care encounter   . Colonic obstruction ([place]) 12/10/2019  . Adrenal insufficiency ([place]) 09/05/2019  . Hypotension 08/21/2019  . Toxic metabolic encephalopathy 0000000  . Hyponatremia  07/31/2019  . AMS (altered mental status) 07/27/2019  . Dehydration 07/26/2019  . PRES (posterior reversible encephalopathy syndrome) 07/07/2019  . AKI (acute kidney injury) ([place]) 07/03/2019  . Hypercalcemia 07/03/2019  . Hypomagnesemia, chronic 07/03/2019  . Anticoagulated 04/15/2019  . Ovary cancer ([place]) 07/03/2017  . Postoperative hypothyroidism 01/01/2013    Expected post op course  Plan: Advance diet as tolerated Ostomy teaching  Eakins pouch to midline ostomy site    LOS: 3 days     .Rosario Adie, [facility], [place]    12/13/2019 8:08 AM

## 2019-12-13 NOTE — Evaluation (Signed)
Physical Therapy Evaluation Patient Details Name: Rhonda Hale MRN: [id number] DOB: [birth year] Today's Date: 12/13/2019   History of Present Illness  74 y.o. female  with past medical history of COPD, HTN, hypothyroidism, and ovarian cancer s/p  TAH BSO on chemotherapy who was admitted on 12/09/2019 with massive abdominal distention and pain.  Imaging revealed a large bowel obstruction that was not amenable to conservative therapy.  She underwent surgery  and a colostomy was placed on 12/30.  Clinical Impression  Pt admitted with above diagnosis.  She was able to ambulate 400' with supervision and transfer with mod I to supervision level.  Demonstrates safe gait and mobility.  Pt very motivated and daughter present and able to assist as needed.  No further therapy indicated.  Recommend ambulation with nursing and/or family.    Follow Up Recommendations No PT follow up    Equipment Recommendations  None recommended by PT    Recommendations for Other Services       Precautions / Restrictions Precautions Precautions: None      Mobility  Bed Mobility Overal bed mobility: Modified Independent             General bed mobility comments: HOB elevated  Transfers Overall transfer level: Needs assistance Equipment used: Rolling walker (2 wheeled) Transfers: Sit to/from Stand Sit to Stand: Supervision         General transfer comment: performed safely without cues  Ambulation/Gait Ambulation/Gait assistance: Supervision Gait Distance (Feet): 400 Feet Assistive device: Rolling walker (2 wheeled) Gait Pattern/deviations: Step-through pattern     General Gait Details: decreased speed but otherwise steady normal gait  Stairs            Wheelchair Mobility    Modified Rankin (Stroke Patients Only)       Balance Overall balance assessment: Needs assistance Sitting-balance support: No upper extremity supported;Feet supported Sitting balance-Leahy Scale: Normal      Standing balance support: Bilateral upper extremity supported;During functional activity Standing balance-Leahy Scale: Good                               Pertinent Vitals/Pain Pain Assessment: 0-10 Pain Score: 7  Pain Location: R abdomen Pain Intervention(s): Limited activity within patient's tolerance    Home Living Family/patient expects to be discharged to:: Private residence Living Arrangements: Children Available Help at Discharge: Family;Available 24 hours/day Type of Home: House Home Access: Level entry     Home Layout: Two level;1/2 bath on main level Home Equipment: Walker - 2 wheels;Shower seat;Walker - 4 wheels;Wheelchair - manual;Bedside commode Additional Comments: Pt living with daughter while undergoing chemo.    Prior Function Level of Independence: Needs assistance   Gait / Transfers Assistance Needed: Pt uses RW in home and rollator in community so she could take rest breaks.  Could do community ambulation.  ADL's / Homemaking Assistance Needed: Performs ADLs independently but has supervision to get up steps to bathroom        Hand Dominance        Extremity/Trunk Assessment   Upper Extremity Assessment Upper Extremity Assessment: Overall WFL for tasks assessed    Lower Extremity Assessment Lower Extremity Assessment: Overall WFL for tasks assessed       Communication   Communication: No difficulties  Cognition Arousal/Alertness: Awake/alert Behavior During Therapy: WFL for tasks assessed/performed Overall Cognitive Status: Within Functional Limits for tasks assessed  General Comments General comments (skin integrity, edema, etc.): Pt very pleasent and motivated.  Daughter very helpful and assisting pt as needed.    Exercises     Assessment/Plan    PT Assessment Patent does not need any further PT services  PT Problem List         PT Treatment Interventions       PT Goals (Current goals can be found in the Care Plan section)  Acute Rehab PT Goals Patient Stated Goal: return to daughter's house PT Goal Formulation: With patient Time For Goal Achievement: 12/20/19 Potential to Achieve Goals: Good    Frequency     Barriers to discharge        Co-evaluation               AM-PAC PT "6 Clicks" Mobility  Outcome Measure Help needed turning from your back to your side while in a flat bed without using bedrails?: None Help needed moving from lying on your back to sitting on the side of a flat bed without using bedrails?: None Help needed moving to and from a bed to a chair (including a wheelchair)?: None Help needed standing up from a chair using your arms (e.g., wheelchair or bedside chair)?: None Help needed to walk in hospital room?: None Help needed climbing 3-5 steps with a railing? : None 6 Click Score: 24    End of Session Equipment Utilized During Treatment: Gait belt Activity Tolerance: Patient tolerated treatment well Patient left: in chair;with family/visitor present;with call bell/phone within reach Nurse Communication: Mobility status(Pt safe to walk with family)      Time: 1140-1206 PT Time Calculation (min) (ACUTE ONLY): 26 min   Charges:   PT Evaluation $PT Eval Low Complexity: 1 Low          Maggie Font, PT Acute Rehab Services Pager [phone] [facility] [phone] [facility] 12/13/2019, 2:21 PM

## 2019-12-13 NOTE — TOC Progression Note (Signed)
Transition of Care [facility]) - Progression Note    Patient Details  Name: Rhonda Hale MRN: [id number] Date of Birth: November 25, 1946  Transition of Care [facility]) CM/SW [place], [place] Phone Number: 12/13/2019, 9:18 AM  Clinical Narrative:    CSW spoke with pt daughter Daleen Snook via telephone to f/u on preferences for home palliative services. Offered choice, pt daughter prefers [facility]. Referral given to Bradd Canary, liaison with [facility].   Expected Discharge Plan: Home/Self Care Barriers to Discharge: Continued Medical Work up  Expected Discharge Plan and Services Expected Discharge Plan: Home/Self Care In-house Referral: Clinical Social Work Discharge Planning Services: CM Consult Post Acute Care Choice: NA Living arrangements for the past 2 months: Single Family Home  Readmission Risk Interventions Readmission Risk Prevention Plan 12/13/2019 12/10/2019 07/05/2019  Post Dischage Appt - - Complete  Medication Screening - - Complete  Transportation Screening Complete Complete Complete  PCP or Specialist Appt within 3-5 Days Complete Not Complete -  Not Complete comments - pending medical progression -  HRI or Home Care Consult Complete Complete -  Social Work Consult for [place] Planning/Counseling Complete Complete -  Palliative Care Screening Complete Not Complete -  Medication Review (RN Care Manager) Referral to Pharmacy Complete -  Some recent data might be hidden

## 2019-12-13 NOTE — Plan of Care (Signed)
  Problem: Pain Managment: Goal: General experience of comfort will improve Outcome: Progressing   Problem: Safety: Goal: Ability to remain free from injury will improve Outcome: Progressing   Problem: Skin Integrity: Goal: Risk for impaired skin integrity will decrease Outcome: Progressing   

## 2019-12-13 NOTE — Progress Notes (Signed)
Internal Medicine Attending:   I saw and examined the patient. I reviewed Dr Lanier's note and I agree with the resident's findings and plan as documented in the resident's note.    

## 2019-12-13 NOTE — Progress Notes (Signed)
Subjective:   Pt was seen at the bedside on rounds this AM. She says she has some mild abdominal tenderness and swelling today, but in a pleasant mood today. She says she is looking forward to eating and drinking today. We discussed that we would like the patient to work with PT today. She states that she has a walker and rolator at home that she uses.  All concerns were addressed.   Consults: surgery, palliative  Objective:  Vital signs in last 24 hours: Vitals:   12/12/19 1421 12/12/19 2123 12/13/19 0353 12/13/19 0835  BP: (!) 125/57 (!) 136/59 (!) 122/58   Pulse: 86 79 75   Resp:  18 17   Temp: 98.4 F (36.9 C) 98.8 F (37.1 C) 98.2 F (36.8 C)   TempSrc: Oral Oral Oral   SpO2: 96% 96% 98% 98%  Weight:      Height:       Physical Exam  Constitutional: No distress.  Cardiovascular: Regular rhythm and intact distal pulses.  No murmur heard. Pulmonary/Chest: Effort normal. No respiratory distress.  Abdominal: She exhibits no distension. There is abdominal tenderness (to palpation).  Ostomy in place with brown output  Neurological: She is alert.  Skin: She is not diaphoretic. There is pallor.  Nursing note and vitals reviewed.  I/Os:  Intake/Output Summary (Last 24 hours) at 12/13/2019 1027 Last data filed at 12/13/2019 0600 Gross per 24 hour  Intake 200 ml  Output 1675 ml  Net -1475 ml   Labs: Results for orders placed or performed during the hospital encounter of 12/09/19 (from the past 24 hour(s))  CBC     Status: Abnormal   Collection Time: 12/13/19  7:48 AM  Result Value Ref Range   WBC 12.6 (H) 4.0 - 10.5 K/uL   RBC 2.42 (L) 3.87 - 5.11 MIL/uL   Hemoglobin 7.7 (L) 12.0 - 15.0 g/dL   HCT 23.2 (L) 36.0 - 46.0 %   MCV 95.9 80.0 - 100.0 fL   MCH 31.8 26.0 - 34.0 pg   MCHC 33.2 30.0 - 36.0 g/dL   RDW 19.5 (H) 11.5 - 15.5 %   Platelets 443 (H) 150 - 400 K/uL   nRBC 0.3 (H) 0.0 - 0.2 %  Basic metabolic panel     Status: Abnormal   Collection Time: 12/13/19   7:48 AM  Result Value Ref Range   Sodium 149 (H) 135 - 145 mmol/L   Potassium 3.2 (L) 3.5 - 5.1 mmol/L   Chloride 117 (H) 98 - 111 mmol/L   CO2 21 (L) 22 - 32 mmol/L   Glucose, Bld 93 70 - 99 mg/dL   BUN 10 8 - 23 mg/dL   Creatinine, Ser 0.73 0.44 - 1.00 mg/dL   Calcium 7.8 (L) 8.9 - 10.3 mg/dL   GFR calc non Af Amer >60 >60 mL/min   GFR calc Af Amer >60 >60 mL/min   Anion gap 11 5 - 15    Assessment/Plan:  Assessment: Ms. Nedeau is a 74 yo F w/ a PMHx significant for COPD, HTN, hx of PE and metastatic ovarian cancer s/p TAH BSO in 2018 on chemotherapy who presented with a few weeks of increasing abdominal distention and new onset nausea and vomiting found to have a large bowel obstruction with abrupt transition point in the LUQ s/p surgery with ostomy creation.  Plan:  Active Problems:  Large bowel obstruction (HCC) -pt with known metastatic ovarian cancer s/p TAH BSO in 2018 currently on chemotherapy  here with large bowel obstruction with abrupt transition point in LUQ now s/p surgery with ostomy creation -surgery OK with advancing diet as tolerated by patient -PT consulted   Plan: -increase diet as tolerated -fu surgery recs -fu PT recs  Metastatic ovarian cancer: -pt with known metastatic ovarian cancer s/p TAH BSO in 2018 currently on chemotherapy here with large bowel obstruction s/p surgery and ostomy creation -palliative will follow patient outpatient -PT consulted   Plan: -fu PT recs  HTN: -continue home bp meds: amlodipine 5 mg, labetalol 50 mg BID  Hx PE: -continue home eliquis   Dispo: Anticipated discharge pending clinical course.  Al Decant, MD 12/13/2019, 10:27 AM Pager: [phone]

## 2019-12-13 NOTE — Progress Notes (Signed)
Manufacturing engineer Documentation  Liaison received referral for pt to be followed by our community [employer]. Our palliative dept will be notified of new referral and liaisons will continue to follow pt in hospital to ensure that we are aware of dc date in order to follow up.   Thank you for the referral and please call with any questions.   Freddie Breech, RN [facility] Liaison [phone]

## 2019-12-14 LAB — CBC
HCT: 25.5 % — ABNORMAL LOW (ref 36.0–46.0)
Hemoglobin: 8.5 g/dL — ABNORMAL LOW (ref 12.0–15.0)
MCH: 31.7 pg (ref 26.0–34.0)
MCHC: 33.3 g/dL (ref 30.0–36.0)
MCV: 95.1 fL (ref 80.0–100.0)
Platelets: 455 10*3/uL — ABNORMAL HIGH (ref 150–400)
RBC: 2.68 MIL/uL — ABNORMAL LOW (ref 3.87–5.11)
RDW: 19.1 % — ABNORMAL HIGH (ref 11.5–15.5)
WBC: 11 10*3/uL — ABNORMAL HIGH (ref 4.0–10.5)
nRBC: 1.1 % — ABNORMAL HIGH (ref 0.0–0.2)

## 2019-12-14 LAB — BASIC METABOLIC PANEL
Anion gap: 11 (ref 5–15)
BUN: 5 mg/dL — ABNORMAL LOW (ref 8–23)
CO2: 21 mmol/L — ABNORMAL LOW (ref 22–32)
Calcium: 7.6 mg/dL — ABNORMAL LOW (ref 8.9–10.3)
Chloride: 105 mmol/L (ref 98–111)
Creatinine, Ser: 0.72 mg/dL (ref 0.44–1.00)
GFR calc Af Amer: >60 mL/min (ref >60)
GFR calc non Af Amer: >60 mL/min (ref >60)
Glucose, Bld: 132 mg/dL — ABNORMAL HIGH (ref 70–99)
Potassium: 2.8 mmol/L — ABNORMAL LOW (ref 3.5–5.1)
Sodium: 137 mmol/L (ref 135–145)

## 2019-12-14 MED ORDER — POTASSIUM CHLORIDE CRYS ER 20 MEQ PO TBCR: 30.0000 meq | EXTENDED_RELEASE_TABLET | Freq: Four times a day (QID) | ORAL | Status: DC

## 2019-12-14 MED ORDER — POTASSIUM CHLORIDE 10 MEQ/100ML IV SOLN
10.0000 meq | INTRAVENOUS | Status: AC
  Administered 2019-12-14 (×6): 10 meq via INTRAVENOUS
  Filled 2019-12-14 (×6): qty 100

## 2019-12-14 MED ORDER — POTASSIUM CHLORIDE CRYS ER 20 MEQ PO TBCR
30.0000 meq | EXTENDED_RELEASE_TABLET | Freq: Four times a day (QID) | ORAL | Status: AC
  Administered 2019-12-14 (×2): 30 meq via ORAL
  Filled 2019-12-14 (×2): qty 1

## 2019-12-14 MED ORDER — DEXTROSE 5 % IV SOLN: INTRAVENOUS | Status: DC

## 2019-12-14 NOTE — Progress Notes (Signed)
OT Screen Note  Patient Details Name: Rhonda Hale MRN: [id number] DOB: 08-19-1946   Cancelled Treatment:    Reason Eval/Treat Not Completed: OT screened, no needs identified, will sign off. Pt and daughter reporting comfort with dc to home and have all needed DME. Pt stating she feels near baseline for ADLs.   [place], OTR/L Acute Rehab Pager: [phone] Office: [phone] 12/14/2019, 3:41 PM

## 2019-12-14 NOTE — Progress Notes (Signed)
3 Days Post-Op loop diverting colostomy creation Subjective: No specific complaints. Tolerating a diet.  Still hasn't had any ostomy teaching  Objective: Vital signs in last 24 hours: Temp:  [98.5 F (36.9 C)-98.8 F (37.1 C)] 98.8 F (37.1 C) (01/02 0601) Pulse Rate:  [69-89] 89 (01/02 0601) Resp:  [16-19] 19 (01/02 0601) BP: (114-142)/(57-66) 142/66 (01/02 0601) SpO2:  [95 %-98 %] 96 % (01/02 0601)   Intake/Output from previous day: 01/01 0701 - 01/02 0700 In: 1040 [P.O.:1040] Out: 1700 [Urine:1150; Stool:550] Intake/Output this shift: Total I/O In: 500 [P.O.:500] Out: 200 [Stool:200]   General appearance: alert and cooperative Resp: nonlabored GI: normal findings: soft, nondistended.  ostomy retracted, red rubber in place Incision: no significant erythema  Lab Results:  Recent Labs    12/12/19 0853 12/13/19 0748  WBC 14.0* 12.6*  HGB 8.1* 7.7*  HCT 25.1* 23.2*  PLT 503* 443*   BMET Recent Labs    12/12/19 0853 12/13/19 0748  NA 148* 149*  K 3.5 3.2*  CL 116* 117*  CO2 20* 21*  GLUCOSE 132* 93  BUN 15 10  CREATININE 1.08* 0.73  CALCIUM 8.1* 7.8*   PT/INR No results for input(s): LABPROT, INR in the last 72 hours. ABG No results for input(s): PHART, HCO3 in the last 72 hours.  Invalid input(s): PCO2, PO2  MEDS, Scheduled . amLODipine  5 mg Per Tube Daily  . apixaban  5 mg Oral BID  . Chlorhexidine Gluconate Cloth  6 each Topical Daily  . feeding supplement  1 Container Oral TID BM  . labetalol  50 mg Per Tube BID  . levothyroxine  150 mcg Oral Q0600  . multivitamin with minerals  1 tablet Oral Daily  . potassium chloride  30 mEq Oral Q6H  . umeclidinium bromide  1 puff Inhalation Daily    Studies/Results: No results found.  Assessment: s/p Procedure(s): OPEN DIVERTED COLOSTOMY Patient Active Problem List   Diagnosis Date Noted  . Palliative care encounter   . Colonic obstruction ([place]) 12/10/2019  . Adrenal insufficiency ([place])  09/05/2019  . Hypotension 08/21/2019  . Toxic metabolic encephalopathy 0000000  . Hyponatremia 07/31/2019  . AMS (altered mental status) 07/27/2019  . Dehydration 07/26/2019  . PRES (posterior reversible encephalopathy syndrome) 07/07/2019  . AKI (acute kidney injury) ([place]) 07/03/2019  . Hypercalcemia 07/03/2019  . Hypomagnesemia, chronic 07/03/2019  . Anticoagulated 04/15/2019  . Ovary cancer ([place]) 07/03/2017  . Postoperative hypothyroidism 01/01/2013    Expected post op course  Plan: Cont soft diet Will need ostomy teaching prior to d/c Eakins pouch to midline ostomy site, seems to fit better than ostomy bag at this time.  Ostomy RN to eval for proper bag    LOS: 4 days     .Rosario Adie, MD [facility], [place]    12/14/2019 8:31 AM

## 2019-12-14 NOTE — Progress Notes (Addendum)
   Subjective: HD#4   Overnight: No acute overnight events reported  Today, Rhonda Hale reports that she is doing well this morning.  She has just started eating breakfast during my assessment.  She denies abdominal pain, nausea, vomiting.  She has been working with physical therapy and actually was able to walk up and down the hallway several times without any difficulties.  She states that she is anticipating discharge.  Objective:  Vital signs in last 24 hours: Vitals:   12/13/19 0835 12/13/19 1529 12/13/19 2100 12/14/19 0601  BP:  114/64 (!) 117/57 (!) 142/66  Pulse:  75 69 89  Resp:   16 19  Temp:  98.7 F (37.1 C) 98.5 F (36.9 C) 98.8 F (37.1 C)  TempSrc:  Oral Oral Oral  SpO2: 98% 95% 97% 96%  Weight:      Height:       Const: In no apparent distress, lying comfortably in bed, conversational Resp: CTA BL, no wheezes, crackles, rhonchi CV: RRR, no murmurs, gallop, rub Abd: Bowel sounds present, nondistended, nontender to palpation, ostomy bag at left mid abdomen with good output.  Site is clean.   Assessment/Plan:  Active Problems:   Ovary cancer (HCC)   Colonic obstruction (HCC)   Palliative care encounter  Assessment: Rhonda Hale is a 74 yo F w/ a PMHx significant for COPD, HTN, hx of PE and metastatic ovarian cancer s/p TAH BSO in 2018 on chemotherapy who presented with a few weeks of increasing abdominal distention and new onset nausea and vomiting found to have a large bowel obstruction with abrupt transition point in the LUQ s/p surgery with ostomy creation.  Plan:  Active Problems:  Large bowel obstruction (HCC) -pt with known metastatic ovarian cancer s/p TAH BSO in 2018 currently on chemotherapy here with large bowel obstruction with abrupt transition point in LUQ now s/p surgery with ostomy creation -Started soft diet and tolerating well -Dr. Marcello Moores (general surgery) made me aware that patient would need several sessions of ostomy teaching and  hopefully discharge on Monday  Plan: -Advance diet as tolerated -fu surgery recs -Ostomy teaching sessions arranged with hope to discharge on Monday   Hypernatremia -sNa of 149 on 12/13/2019. Free water deficit of 2.4 L.  -Likely due to NG output from previous days -Encouraged free water intake + D5W @150  mL/hr for 16 hours since she's still admitted  -Continue to monitor labs   Hypokalemia -Will replete   Metastatic ovarian cancer: -pt with known metastatic ovarian cancer s/p TAH BSO in 2018 currently on chemotherapy here with large bowel obstruction s/p surgery and ostomy creation -palliative will follow patient outpatient -Follow-up with oncology outpatient   HTN: -continue home bp meds: amlodipine 5 mg, labetalol 50 mg BID   Hx PE: -continue home eliquis    Dispo: Anticipated discharge pending clinical course.  Jean Rosenthal, MD 12/14/2019, 6:56 AM Pager: [phone] Internal Medicine Teaching Service

## 2019-12-15 LAB — BASIC METABOLIC PANEL
Anion gap: 8 (ref 5–15)
BUN: <5 mg/dL — ABNORMAL LOW (ref 8–23)
CO2: 21 mmol/L — ABNORMAL LOW (ref 22–32)
Calcium: 7.6 mg/dL — ABNORMAL LOW (ref 8.9–10.3)
Chloride: 107 mmol/L (ref 98–111)
Creatinine, Ser: 0.77 mg/dL (ref 0.44–1.00)
GFR calc Af Amer: >60 mL/min (ref >60)
GFR calc non Af Amer: >60 mL/min (ref >60)
Glucose, Bld: 110 mg/dL — ABNORMAL HIGH (ref 70–99)
Potassium: 3 mmol/L — ABNORMAL LOW (ref 3.5–5.1)
Sodium: 136 mmol/L (ref 135–145)

## 2019-12-15 LAB — CBC
HCT: 25.4 % — ABNORMAL LOW (ref 36.0–46.0)
Hemoglobin: 8.3 g/dL — ABNORMAL LOW (ref 12.0–15.0)
MCH: 31.1 pg (ref 26.0–34.0)
MCHC: 32.7 g/dL (ref 30.0–36.0)
MCV: 95.1 fL (ref 80.0–100.0)
Platelets: 454 10*3/uL — ABNORMAL HIGH (ref 150–400)
RBC: 2.67 MIL/uL — ABNORMAL LOW (ref 3.87–5.11)
RDW: 18.9 % — ABNORMAL HIGH (ref 11.5–15.5)
WBC: 11.2 10*3/uL — ABNORMAL HIGH (ref 4.0–10.5)
nRBC: 1.3 % — ABNORMAL HIGH (ref 0.0–0.2)

## 2019-12-15 LAB — MAGNESIUM: Magnesium: 1 mg/dL — ABNORMAL LOW (ref 1.7–2.4)

## 2019-12-15 MED ORDER — CALCIUM POLYCARBOPHIL 625 MG PO TABS
625.0000 mg | ORAL_TABLET | Freq: Two times a day (BID) | ORAL | Status: DC
  Administered 2019-12-15 – 2019-12-17 (×5): 625 mg via ORAL
  Filled 2019-12-15 (×7): qty 1

## 2019-12-15 MED ORDER — MAGNESIUM SULFATE 2 GM/50ML IV SOLN
2.0000 g | Freq: Once | INTRAVENOUS | Status: AC
  Administered 2019-12-15: 2 g via INTRAVENOUS
  Filled 2019-12-15: qty 50

## 2019-12-15 MED ORDER — POTASSIUM CHLORIDE 10 MEQ/100ML IV SOLN
10.0000 meq | INTRAVENOUS | Status: AC
  Administered 2019-12-15 (×6): 10 meq via INTRAVENOUS
  Filled 2019-12-15 (×5): qty 100

## 2019-12-15 NOTE — Progress Notes (Signed)
Educated pt and daughter, Daleen Snook (who is a Therapist, sports working at [employer]) on [employer].  Pt had on a 2 piece Hollister 2 3/4" which was working but in changing it, I ended up using an Eakin's pouch because of the support bridges.  We prepped the skin with skin prep and applied the medium Eakins pouch, cutting out areas for the bridges (red rubber). I made a window using rectangular foam dressings around the edge of the Eakins pouch for support and reinforcement.  For night time, we hooked it up to a straight drainage bag as output is liquid in consistency.  I gave Daleen Snook information to read on the Loop colostomy.  Pt and dtr understand how to empty the pouch and the closure system.  Ordered 5 additional 2 piece systems, skin prep box, adhesive remover pads box for taking home.  Daleen Snook states that she feels much better about changing the pouch and I think they will do well with it if there is some HHRN support for further education.  Reviewed some dietary considerations and potential complications.

## 2019-12-15 NOTE — Progress Notes (Signed)
   Subjective: HD#5   Overnight: No acute overnight events reported  Today, Rhonda Hale says she is feeling well today, but had an episode of sharp pain in her abdomen that lasted for a short period of time. She denied eating prior to the episode of pain. She also had nausea with the pain. She used the bathroom later and the pain subsequently stopped. Pt says Dr. Marcello Moores has not been by today as of yet.   Objective:  Vital signs in last 24 hours: Vitals:   12/14/19 1402 12/14/19 1955 12/15/19 0446 12/15/19 0448  BP: 97/81 127/63 (!) 119/57 118/61  Pulse: 90 86 89 79  Resp: 18 20 18    Temp: 98.8 F (37.1 C) 98.6 F (37 C) 99.1 F (37.3 C) 99.1 F (37.3 C)  TempSrc: Oral Oral Oral   SpO2: 98% 97% 96%   Weight:   75.5 kg   Height:        Const: In no apparent distress, lying comfortably in bed, conversational Abd: Bowel sounds present, nondistended, mildly tender to palpation around her ostomy site otherwise surgical site looks clean with good output from the ostomy   Assessment/Plan:  Active Problems:   Ovary cancer (HCC)   Colonic obstruction (HCC)   Palliative care encounter  Assessment: Rhonda Hale is a 74 yo F w/ a PMHx significant for COPD, HTN, hx of PE and metastatic ovarian cancer s/p TAH BSO in 2018 on chemotherapy who presented with a few weeks of increasing abdominal distention and new onset nausea and vomiting found to have a large bowel obstruction with abrupt transition point in the LUQ s/p surgery with ostomy creation.  Plan: Active Problems:  Large bowel obstruction (HCC) -pt with known metastatic ovarian cancer s/p TAH BSO in 2018 currently on chemotherapy here with large bowel obstruction with abrupt transition point in LUQ now s/p surgery with ostomy creation  Plan: -Advance diet as tolerated -fusurgery recs -Ostomy teaching sessions arranged with hope to discharge on Monday   Hypokalemia - IV repletion  Hypomagnesemia -IV  repletion -Follow-up a.m. labs   Metastatic ovarian cancer: -pt with known metastatic ovarian cancer s/p TAH BSO in 2018 currently on chemotherapy here with large bowel obstruction s/p surgery and ostomy creation -palliative will follow patient outpatient -Follow-up with oncology outpatient   HTN: -continue home bp meds: amlodipine 5 mg, labetalol 50 mg BID   Hx PE: -continue home eliquis   Hypernatremia-resolved  -Continue to monitor labs   Dispo: Anticipated discharge pending clinical course  Jean Rosenthal, MD 12/15/2019, 6:51 AM Pager: [phone] Internal Medicine Teaching Service

## 2019-12-15 NOTE — Progress Notes (Addendum)
4 Days Post-Op loop diverting colostomy creation Subjective: C/o right sided abd pain. Tolerating a diet.  Still hasn't had any ostomy teaching  Objective: Vital signs in last 24 hours: Temp:  [98.6 F (37 C)-99.1 F (37.3 C)] 99.1 F (37.3 C) (01/03 0448) Pulse Rate:  [79-90] 79 (01/03 0448) Resp:  [18-20] 18 (01/03 0446) BP: (97-127)/(57-81) 118/61 (01/03 0448) SpO2:  [96 %-98 %] 96 % (01/03 0446) Weight:  [75.5 kg] 75.5 kg (01/03 0446)   Intake/Output from previous day: 01/02 0701 - 01/03 0700 In: 4160.3 [P.O.:1340; I.V.:2657.3; IV Piggyback:163] Out: 1225 [Urine:200; Stool:1025] Intake/Output this shift: No intake/output data recorded.   General appearance: alert and cooperative Resp: nonlabored GI: normal findings: soft, nondistended.  ostomy retracted but viable, red rubber in place Incision: no significant erythema Will add fiber pill to thicken her stool  Lab Results:  Recent Labs    12/14/19 1551 12/15/19 0435  WBC 11.0* 11.2*  HGB 8.5* 8.3*  HCT 25.5* 25.4*  PLT 455* 454*   BMET Recent Labs    12/14/19 1551 12/15/19 0435  NA 137 136  K 2.8* 3.0*  CL 105 107  CO2 21* 21*  GLUCOSE 132* 110*  BUN 5* <5*  CREATININE 0.72 0.77  CALCIUM 7.6* 7.6*   PT/INR No results for input(s): LABPROT, INR in the last 72 hours. ABG No results for input(s): PHART, HCO3 in the last 72 hours.  Invalid input(s): PCO2, PO2  MEDS, Scheduled . amLODipine  5 mg Per Tube Daily  . apixaban  5 mg Oral BID  . Chlorhexidine Gluconate Cloth  6 each Topical Daily  . feeding supplement  1 Container Oral TID BM  . labetalol  50 mg Per Tube BID  . levothyroxine  150 mcg Oral Q0600  . multivitamin with minerals  1 tablet Oral Daily  . umeclidinium bromide  1 puff Inhalation Daily    Studies/Results: No results found.  Assessment: s/p Procedure(s): OPEN DIVERTED COLOSTOMY Patient Active Problem List   Diagnosis Date Noted  . Palliative care encounter   . Colonic  obstruction ([place]) 12/10/2019  . Adrenal insufficiency ([place]) 09/05/2019  . Hypotension 08/21/2019  . Toxic metabolic encephalopathy 0000000  . Hyponatremia 07/31/2019  . AMS (altered mental status) 07/27/2019  . Dehydration 07/26/2019  . PRES (posterior reversible encephalopathy syndrome) 07/07/2019  . AKI (acute kidney injury) ([place]) 07/03/2019  . Hypercalcemia 07/03/2019  . Hypomagnesemia, chronic 07/03/2019  . Anticoagulated 04/15/2019  . Ovary cancer ([place]) 07/03/2017  . Postoperative hypothyroidism 01/01/2013    Expected post op course  Plan: Cont soft diet Will need ostomy teaching prior to d/c.  This is her major barrier to discharge Eakins pouch to midline ostomy site for now as it seems to fit better than ostomy bag at this time.  Ostomy RN to eval for proper bag    LOS: 5 days     .Rosario Adie, MD [facility], [place]    12/15/2019 8:23 AM

## 2019-12-16 LAB — BASIC METABOLIC PANEL
Anion gap: 12 (ref 5–15)
Anion gap: 10 (ref 5–15)
BUN: <5 mg/dL — ABNORMAL LOW (ref 8–23)
BUN: 5 mg/dL — ABNORMAL LOW (ref 8–23)
CO2: 21 mmol/L — ABNORMAL LOW (ref 22–32)
CO2: 20 mmol/L — ABNORMAL LOW (ref 22–32)
Calcium: 7.8 mg/dL — ABNORMAL LOW (ref 8.9–10.3)
Calcium: 7.9 mg/dL — ABNORMAL LOW (ref 8.9–10.3)
Chloride: 102 mmol/L (ref 98–111)
Chloride: 105 mmol/L (ref 98–111)
Creatinine, Ser: 0.98 mg/dL (ref 0.44–1.00)
Creatinine, Ser: 0.83 mg/dL (ref 0.44–1.00)
GFR calc Af Amer: >60 mL/min (ref >60)
GFR calc Af Amer: >60 mL/min (ref >60)
GFR calc non Af Amer: 56 mL/min — ABNORMAL LOW (ref >60)
GFR calc non Af Amer: >60 mL/min (ref >60)
Glucose, Bld: 100 mg/dL — ABNORMAL HIGH (ref 70–99)
Glucose, Bld: 109 mg/dL — ABNORMAL HIGH (ref 70–99)
Potassium: 2.9 mmol/L — ABNORMAL LOW (ref 3.5–5.1)
Potassium: 3.2 mmol/L — ABNORMAL LOW (ref 3.5–5.1)
Sodium: 135 mmol/L (ref 135–145)
Sodium: 135 mmol/L (ref 135–145)

## 2019-12-16 LAB — MAGNESIUM: Magnesium: 1.3 mg/dL — ABNORMAL LOW (ref 1.7–2.4)

## 2019-12-16 MED ORDER — OXYCODONE HCL 5 MG PO TABS
5.0000 mg | ORAL_TABLET | Freq: Four times a day (QID) | ORAL | Status: DC | PRN
  Administered 2019-12-16 – 2019-12-17 (×2): 5 mg via ORAL
  Filled 2019-12-16 (×2): qty 1

## 2019-12-16 MED ORDER — METHOCARBAMOL 750 MG PO TABS
750.0000 mg | ORAL_TABLET | Freq: Four times a day (QID) | ORAL | Status: DC | PRN
  Administered 2019-12-16 – 2019-12-17 (×2): 750 mg via ORAL
  Filled 2019-12-16 (×2): qty 1

## 2019-12-16 MED ORDER — MORPHINE SULFATE (PF) 2 MG/ML IV SOLN: 2.0000 mg | INTRAVENOUS | Status: DC | PRN

## 2019-12-16 MED ORDER — POTASSIUM CHLORIDE 10 MEQ/100ML IV SOLN
10.0000 meq | INTRAVENOUS | Status: AC
  Administered 2019-12-16 (×4): 10 meq via INTRAVENOUS
  Filled 2019-12-16 (×4): qty 100

## 2019-12-16 MED ORDER — MAGNESIUM SULFATE 4 GM/100ML IV SOLN
4.0000 g | Freq: Once | INTRAVENOUS | Status: AC
  Administered 2019-12-16: 14:00:00 4 g via INTRAVENOUS
  Filled 2019-12-16: qty 100

## 2019-12-16 MED ORDER — METHOCARBAMOL 500 MG PO TABS
500.0000 mg | ORAL_TABLET | Freq: Three times a day (TID) | ORAL | Status: DC | PRN
  Administered 2019-12-16: 500 mg via ORAL
  Filled 2019-12-16: qty 1

## 2019-12-16 MED ORDER — POTASSIUM CHLORIDE 10 MEQ/100ML IV SOLN
10.0000 meq | INTRAVENOUS | Status: AC
  Administered 2019-12-16 (×6): 10 meq via INTRAVENOUS
  Filled 2019-12-16 (×6): qty 100

## 2019-12-16 MED ORDER — SIMETHICONE 80 MG PO CHEW
80.0000 mg | CHEWABLE_TABLET | Freq: Four times a day (QID) | ORAL | Status: DC
  Administered 2019-12-16 – 2019-12-17 (×6): 80 mg via ORAL
  Filled 2019-12-16 (×6): qty 1

## 2019-12-16 NOTE — Discharge Instructions (Addendum)
Information on my medicine - ELIQUIS (apixaban)  This medication education was reviewed with me or my healthcare representative as part of my discharge preparation. -- You were taking this medication prior to this hospital admission.   Why was Eliquis prescribed for you?  Eliquis was prescribed to treat blood clots that may have been found in the veins of your legs (deep vein thrombosis) or in your lungs (pulmonary embolism) and to reduce the risk of them occurring again.-   You are taking Eliquis (apixaban) for history of pulmonary embolism.   What do You need to know about Eliquis ? Continue taking ONE 5 mg tablet taken TWICE daily.  Eliquis may be taken with or without food.   Try to take the dose about the same time in the morning and in the evening. If you have difficulty swallowing the tablet whole please discuss with your pharmacist how to take the medication safely.  Take Eliquis exactly as prescribed and DO NOT stop taking Eliquis without talking to the doctor who prescribed the medication.  Stopping may increase your risk of developing a new blood clot.  Refill your prescription before you run out.  After discharge, you should have regular check-up appointments with your healthcare provider that is prescribing your Eliquis.    What do you do if you miss a dose? If a dose of ELIQUIS is not taken at the scheduled time, take it as soon as possible on the same day and twice-daily administration should be resumed. The dose should not be doubled to make up for a missed dose.  Important Safety Information A possible side effect of Eliquis is bleeding. You should call your healthcare provider right away if you experience any of the following: ? Bleeding from an injury or your nose that does not stop. ? Unusual colored urine (red or dark brown) or unusual colored stools (red or black). ? Unusual bruising for unknown reasons. ? A serious fall or if you hit your head (even if there is  no bleeding).  Some medicines may interact with Eliquis and might increase your risk of bleeding or clotting while on Eliquis. To help avoid this, consult your healthcare provider or pharmacist prior to using any new prescription or non-prescription medications, including herbals, vitamins, non-steroidal anti-inflammatory drugs (NSAIDs) and supplements.  This website has more information on Eliquis (apixaban): http://www.eliquis.com/eliquis/home    Colostomy Home Guide, Adult  Colostomy surgery is done to create an opening in the front of the abdomen for stool (feces) to leave the body through an ostomy (stoma). Part of the large intestine is attached to the stoma. A bag, also called a pouch, is fitted over the stoma. Stool and gas will collect in the bag. After surgery, you will need to empty and change your colostomy bag as needed. You will also need to care for your stoma. How to care for the stoma Your stoma should look pink, red, and moist, like the inside of your cheek. Soon after surgery, the stoma may be swollen, but this swelling will go away within 6 weeks. To care for the stoma:  Keep the skin around the stoma clean and dry.  Use a clean, soft washcloth to gently wash the stoma and the skin around it. Clean using a circular motion, and wipe away from the stoma opening, not toward it. ? Use warm water and only use cleansers recommended by your health care provider. ? Rinse the stoma area with plain water. ? Dry the area around  the stoma well.  Use stoma powder or ointment on your skin only as told by your health care provider. Do not use any other powders, gels, wipes, or creams on the skin around the stoma.  Check the stoma area every day for signs of infection. Check for: ? New or worsening redness, swelling, or pain. ? New or increased fluid or blood. ? Pus or warmth.  Measure the stoma opening regularly and record the size. Watch for changes. (It is normal for the stoma  to get smaller as swelling goes away.) Share this information with your health care provider. How to empty the colostomy bag  Empty your bag at bedtime and whenever it is one-third to one-half full. Do not let the bag get more than half-full with stool or gas. The bag could leak if it gets too full. Some colostomy bags have a built-in gas release valve that releases gas often throughout the day. Follow these basic steps: 1. Wash your hands with soap and water. 2. Sit far back on the toilet seat. 3. Put several pieces of toilet paper into the toilet water. This will prevent splashing as you empty stool into the toilet. 4. Remove the clip or the hook-and-loop fastener from the tail end of the bag. 5. Unroll the tail, then empty the stool into the toilet. 6. Clean the tail with toilet paper or a moist towelette. 7. Reroll the tail, and close it with the clip or the hook-and-loop fastener. 8. Wash your hands again. How to change the colostomy bag Change your bag every 3-4 days or as often as told by your health care provider. Also change the bag if it is leaking or separating from the skin, or if your skin around the stoma looks or feels irritated. Irritated skin may be a sign that the bag is leaking. Always have colostomy supplies with you, and follow these basic steps: 1. Wash your hands with soap and water. Have paper towels or tissues nearby to clean any discharge. 2. Remove the old bag and skin barrier. Use your fingers or a warm cloth to gently push the skin away from the barrier. 3. Clean the stoma area with water or with mild soap and water, as directed. Use water to rinse away any soap. 4. Dry the skin. You may use the cool setting on a hair dryer to do this. 5. Use a tracing pattern (template) to cut the skin barrier to the size needed. 6. If you are using a two-piece bag, attach the bag and the skin barrier to each other. Add the barrier ring, if you use one. 7. If directed, apply stoma  powder or skin barrier gel to the skin. 8. Warm the skin barrier with your hands, or blow with a hair dryer for 5-10 seconds. 9. Remove the paper from the adhesive strip of the skin barrier. 10. Press the adhesive strip onto the skin around the stoma. 11. Gently rub the skin barrier onto the skin. This creates heat that helps the barrier to stick. 12. Apply stoma tape to the edges of the skin barrier, if desired. 36. Wash your hands again. General recommendations  Avoid wearing tight clothes or having anything press directly on your stoma or bag. Change your clothing whenever it is soiled or damp.  You may shower or bathe with the bag on or off. Do not use harsh or oily soaps or lotions. Dry the skin and bag after bathing.  Store all supplies in a cool,  dry place. Do not leave supplies in extreme heat because some parts can melt or not stick as well.  Whenever you leave home, take extra clothing and an extra skin barrier and bag with you.  If your bag gets wet, you can dry it with a hair dryer on the cool setting.  To prevent odor, you may put drops of ostomy deodorizer in the bag.  If recommended by your health care provider, put ostomy lubricant inside the bag. This helps stool to slide out of the bag more easily and completely. Contact a health care provider if:  You have new or worsening redness, swelling, or pain around your stoma.  You have new or increased fluid or blood coming from your stoma.  Your stoma feels warm to the touch.  You have pus coming from your stoma.  Your stoma extends in or out farther than normal.  You need to change your bag every day.  You have a fever. Get help right away if:  Your stool is bloody.  You have nausea or you vomit.  You have trouble breathing. Summary  Measure your stoma opening regularly and record the size. Watch for changes.  Empty your bag at bedtime and whenever it is one-third to one-half full. Do not let the bag get  more than half-full with stool or gas.  Change your bag every 3-4 days or as often as told by your health care provider.  Whenever you leave home, take extra clothing and an extra skin barrier and bag with you. This information is not intended to replace advice given to you by your health care provider. Make sure you discuss any questions you have with your health care provider. Document Revised: 03/20/2019 Document Reviewed: 05/24/2017 Elsevier Patient Education  [place].

## 2019-12-16 NOTE — Progress Notes (Addendum)
Subjective:   Pt was seen at the bedside on rounds this AM. Pt states that she continues to have episodic abdominal pain that comes and goes. She says "it grabs me". Denies that is contstant, and says it comes and goes. Dr. Marcello Moores came by this AM, but the patient didn't talk about the pain this AM because she wasn't in pain. Pt is willing to try a medication for gas. Pt is not comfortable going home right now. We said we would come by this afternoon to check in on her. All concerns were addressed.  Consults: surgery, palliative  Objective:  Vital signs in last 24 hours: Vitals:   12/15/19 1600 12/15/19 2056 12/16/19 0454 12/16/19 0732  BP: (!) 113/57 115/62 120/63   Pulse: 86 87 90 82  Resp: 18 18 18 18   Temp: 97.8 F (36.6 C) 98.9 F (37.2 C) 98.6 F (37 C)   TempSrc: Oral Oral Oral   SpO2: 97% 98% 98% 97%  Weight:      Height:       Physical Exam  Constitutional: No distress.  Cardiovascular: Normal rate, regular rhythm and intact distal pulses.  No murmur heard. Pulmonary/Chest: Effort normal. No respiratory distress.  Abdominal: She exhibits no distension. There is abdominal tenderness (to palpation).  Ostomy in place with brown output  Neurological: She is alert.  Skin: She is not diaphoretic. There is pallor.  Nursing note and vitals reviewed.  I/Os:  Intake/Output Summary (Last 24 hours) at 12/16/2019 0832 Last data filed at 12/16/2019 0500 Gross per 24 hour  Intake 480 ml  Output 750 ml  Net -270 ml   Labs: Results for orders placed or performed during the hospital encounter of 12/09/19 (from the past 24 hour(s))  Basic metabolic panel     Status: Abnormal   Collection Time: 12/16/19  4:10 AM  Result Value Ref Range   Sodium 135 135 - 145 mmol/L   Potassium 2.9 (L) 3.5 - 5.1 mmol/L   Chloride 102 98 - 111 mmol/L   CO2 21 (L) 22 - 32 mmol/L   Glucose, Bld 100 (H) 70 - 99 mg/dL   BUN <5 (L) 8 - 23 mg/dL   Creatinine, Ser 0.98 0.44 - 1.00 mg/dL   Calcium  7.8 (L) 8.9 - 10.3 mg/dL   GFR calc non Af Amer 56 (L) >60 mL/min   GFR calc Af Amer >60 >60 mL/min   Anion gap 12 5 - 15  Magnesium     Status: Abnormal   Collection Time: 12/16/19  4:10 AM  Result Value Ref Range   Magnesium 1.3 (L) 1.7 - 2.4 mg/dL    Assessment/Plan:  Assessment: Rhonda Hale is a 74 yo F w/ a PMHx significant for COPD, HTN, hx of PE and metastatic ovarian cancer s/p TAH BSO in 2018 on chemotherapy who presented with a few weeks of increasing abdominal distention and new onset nausea and vomiting found to have a large bowel obstruction with abrupt transition point in the LUQ s/p surgery with ostomy creation.  Plan:  Active Problems:  Large bowel obstruction (HCC) -pt with known metastatic ovarian cancer s/p TAH BSO in 2018 currently on chemotherapy here with large bowel obstruction with abrupt transition point in LUQ now s/p surgery with ostomy creation -wound ostomy consulted prior to discharge today -pt has sharp intermittent abdominal pain today  Plan: -simethicone for gas pains  HTN: -continue home bp meds: amlodipine 5 mg, labetalol 50 mg BID  Hx PE: -continue  home eliquis   Dispo: Anticipated discharge pending clinical course.  Al Decant, MD 12/16/2019, 8:32 AM Pager: [phone]

## 2019-12-16 NOTE — Discharge Summary (Addendum)
Name: Rhonda Hale MRN: [id number] DOB: July 06, 1946 74 y.o. PCP: Marianna Payment, MD  Date of Admission: 12/09/2019  3:57 PM Date of Discharge:  12/17/19 Attending Physician: Lucious Groves, DO  Discharge Diagnosis:  1. Large bowel obstruction, s/p diverting loop colostomy 12/11/19 by Dr Kieth Brightly 2. Ovarian Cancer 3. History of Total Hysterectomy, Splenectomy, debulking surgery  Discharge Medications: Allergies as of 12/17/2019       Reactions   Atorvastatin Other (See Comments)   Severe heartburn, reflux        Medication List     STOP taking these medications    nitrofurantoin (macrocrystal-monohydrate) 100 MG capsule Commonly known as: MACROBID       TAKE these medications    acetaminophen 325 MG tablet Commonly known as: TYLENOL Take 2 tablets (650 mg total) by mouth every 6 (six) hours as needed for mild pain (or Fever >/= 101).   amLODipine 5 MG tablet Commonly known as: NORVASC Take 1 tablet (5 mg total) by mouth daily.   apixaban 5 MG Tabs tablet Commonly known as: Eliquis Take 1 tablet (5 mg total) by mouth 2 (two) times daily.   calcitRIOL 0.5 MCG capsule Commonly known as: ROCALTROL Take 1 capsule (0.5 mcg total) by mouth daily.   Calcium-Vitamin D 600-400 MG-UNIT Tabs Take 1 tablet by mouth 2 (two) times a day.   escitalopram 20 MG tablet Commonly known as: LEXAPRO Take 1 tablet (20 mg total) by mouth daily.   Incruse Ellipta 62.5 MCG/INH Aepb Generic drug: umeclidinium bromide Inhale 1 puff into the lungs daily.   labetalol 100 MG tablet Commonly known as: NORMODYNE Take 0.5 tablets (50 mg total) by mouth 2 (two) times daily.   levothyroxine 150 MCG tablet Commonly known as: SYNTHROID Take 1 tablet (150 mcg total) by mouth daily before breakfast.   megestrol 20 MG tablet Commonly known as: MEGACE Take 2 tablets (40 mg total) by mouth 2 (two) times daily.   methocarbamol 750 MG tablet Commonly known as: ROBAXIN Take 1 tablet (750  mg total) by mouth every 6 (six) hours as needed for muscle spasms.   multivitamin with minerals Tabs tablet Take 1 tablet by mouth daily.   oxyCODONE 5 MG immediate release tablet Commonly known as: Oxy IR/ROXICODONE Take 1 tablet (5 mg total) by mouth every 6 (six) hours as needed for moderate pain or severe pain.   polycarbophil 625 MG tablet Commonly known as: FIBERCON Take 1 tablet (625 mg total) by mouth 2 (two) times daily.   QUEtiapine 25 MG tablet Commonly known as: SEROQUEL Take 1 tablet (25 mg total) by mouth at bedtime. What changed: how much to take   simethicone 80 MG chewable tablet Commonly known as: MYLICON Chew 1 tablet (80 mg total) by mouth 4 (four) times daily.        Disposition and follow-up:   Rhonda Hale was discharged from [facility] in Stable condition.  At the hospital follow up visit please address:   1.  Patient comfort with ostomy care. Continued improvement of abdominal pain. Goals of care.  2.  Labs / imaging needed at time of follow-up: BMP to check K  3.  Pending labs/ test needing follow-up: na  Follow-up Appointments: Follow-up Information     Kinsinger, Arta Bruce, MD. Schedule an appointment as soon as possible for a visit.   Specialty: General Surgery Contact information: [place] [address] [phone]  Hospital Course by problem list:   1. Large bowel obstruction -pt with known metastatic ovarian cancer s/p TAH BSO in 2018 currently on chemotherapy here with large bowel obstruction with abrupt transition point in LUQ now s/p NGT and surgery with ostomy creation now tolerating solid diet -low mag and K both repleted prior to discharge  Discharge Vitals:   BP (!) 108/54 (BP Location: Right Arm)   Pulse 79   Temp 98.5 F (36.9 C) (Oral)   Resp 17   Ht 5' 2.01" (1.575 m)   Wt 75.5 kg   SpO2 98%   BMI 30.44 kg/m   Pertinent Labs, Studies, and  Procedures:   CBC Latest Ref Rng & Units 12/17/2019 12/15/2019 12/14/2019  WBC 4.0 - 10.5 K/uL 9.3 11.2(H) 11.0(H)  Hemoglobin 12.0 - 15.0 g/dL 8.2(L) 8.3(L) 8.5(L)  Hematocrit 36.0 - 46.0 % 24.8(L) 25.4(L) 25.5(L)  Platelets 150 - 400 K/uL 530(H) 454(H) 455(H)   CMP Latest Ref Rng & Units 12/17/2019 12/16/2019 12/16/2019  Glucose 70 - 99 mg/dL 120(H) 109(H) 100(H)  BUN 8 - 23 mg/dL <5(L) 5(L) <5(L)  Creatinine 0.44 - 1.00 mg/dL 0.79 0.83 0.98  Sodium 135 - 145 mmol/L 134(L) 135 135  Potassium 3.5 - 5.1 mmol/L 3.4(L) 3.2(L) 2.9(L)  Chloride 98 - 111 mmol/L 104 105 102  CO2 22 - 32 mmol/L 20(L) 20(L) 21(L)  Calcium 8.9 - 10.3 mg/dL 8.1(L) 7.9(L) 7.8(L)  Total Protein 6.5 - 8.1 g/dL - - -  Total Bilirubin 0.3 - 1.2 mg/dL - - -  Alkaline Phos 38 - 126 U/L - - -  AST 15 - 41 U/L - - -  ALT 0 - 44 U/L - - -   CT ABDOMEN AND PELVIS WITH CONTRAST IMPRESSION: 1. Findings consistent with colonic obstruction with abrupt transition point in the left upper quadrant at the region of the splenic flexure. Findings may be due to adhesions given prior splenectomy, however this is in the region of ill-defined soft tissue density which is poorly defined. It is unclear if this soft tissue density represents decompressed adjacent bowel loops or possibly omental disease given history of ovarian cancer. 2. New retroperitoneal and mesenteric adenopathy, suspicious for metastatic disease in the setting of ovarian cancer. 3. Small amount of free fluid/ascites in the upper quadrants and right lower quadrant. 4. Umbilical hernia contains loop of small bowel, however no associated small bowel obstruction or inflammation.  PORTABLE ABDOMEN - 1 VIEW IMPRESSION: Tip and side port of the enteric tube below the diaphragm in the Stomach.  Discharge Instructions: Discharge Instructions     Diet - low sodium heart healthy   Complete by: As directed    Discharge instructions   Complete by: As directed    Rhonda Hale,  It was a pleasure taking care of you here in the hospital.  You were admitted because of small bowel obstruction.  You were taken to surgery to fix the obstruction and you had a colostomy placed.  Our nursing staff and the surgical team will give you instructions on how to manage the colostomy.  We would like for you to follow-up with your primary doctor and also the surgeon.  Take care!   Increase activity slowly   Complete by: As directed        Signed: Al Decant, MD 12/17/2019, 8:59 AM   Pager: [phone]

## 2019-12-16 NOTE — Consult Note (Addendum)
[facility] Nurse ostomy follow up Patient receiving care in [facility].  PA B. Meuth at bedside for ostomy assessment Stoma type/location: I could not visualize an actual stoma, there is an area with heavy slough that bubbles when the bowel moves.  Red rubber support rods are sutured in place.  The abdomen is distended. Earlier this morning the drainage in the Eakin tubing was thick, pink, and resembled pus. Stomal assessment/size: Could not be determined Peristomal assessment: intact Treatment options for stomal/peristomal skin: skin barrier wipe and barrier ring along the inferior border. Output thin, varying colors (yellow, brown, white with a slight pink tinge) at times. Ostomy pouching: 1pc. Eakin pouch Education provided: Demonstrated to the daughter and patient the use of the small Eakin pouch.  Education folder and traditional ostomy pouches in room for the patient to take with her at the time of discharge. Enrolled patient in [place] Discharge program: No--she will need an Eakin pouch initially. Val Riles, RN, MSN, CWOCN, CNS-BC, pager [phone]

## 2019-12-16 NOTE — Plan of Care (Signed)

## 2019-12-16 NOTE — Progress Notes (Addendum)
[facility] Progress Note  5 Days Post-Op  Subjective: CC-  Having some abdominal spasms this AM. Denies n/v. Tolerating PO intake but does not have much of an appetite.  Hoping to go home tomorrow.  Objective: Vital signs in last 24 hours: Temp:  [97.8 F (36.6 C)-98.9 F (37.2 C)] 98.6 F (37 C) (01/04 0454) Pulse Rate:  [82-90] 82 (01/04 0732) Resp:  [18] 18 (01/04 0732) BP: (113-120)/(57-63) 120/63 (01/04 0454) SpO2:  [97 %-98 %] 97 % (01/04 0732) Last BM Date: 12/14/19  Intake/Output from previous day: 01/03 0701 - 01/04 0700 In: 600 [P.O.:600] Out: 750 [Stool:750] Intake/Output this shift: No intake/output data recorded.  PE: Gen:  Alert, NAD, pleasant HEENT: EOM's intact, pupils equal and round Pulm:  Rate and effort normal Abd: Soft, mild distension, appropriately tender, +BS, Eakins pouch over ostomy/ ostomy retracted/ red rubber catheter x2 in place Skin: no rashes noted, warm and dry  Lab Results:  Recent Labs    12/14/19 1551 12/15/19 0435  WBC 11.0* 11.2*  HGB 8.5* 8.3*  HCT 25.5* 25.4*  PLT 455* 454*   BMET Recent Labs    12/15/19 0435 12/16/19 0410  NA 136 135  K 3.0* 2.9*  CL 107 102  CO2 21* 21*  GLUCOSE 110* 100*  BUN <5* <5*  CREATININE 0.77 0.98  CALCIUM 7.6* 7.8*   PT/INR No results for input(s): LABPROT, INR in the last 72 hours. CMP     Component Value Date/Time   NA 135 12/16/2019 0410   NA 140 09/04/2019 1420   K 2.9 (L) 12/16/2019 0410   CL 102 12/16/2019 0410   CO2 21 (L) 12/16/2019 0410   GLUCOSE 100 (H) 12/16/2019 0410   BUN <5 (L) 12/16/2019 0410   BUN 12 09/04/2019 1420   CREATININE 0.98 12/16/2019 0410   CALCIUM 7.8 (L) 12/16/2019 0410   PROT 5.8 (L) 12/12/2019 0853   PROT 6.2 07/24/2019 1144   ALBUMIN 2.4 (L) 12/12/2019 0853   ALBUMIN 4.1 07/24/2019 1144   AST 14 (L) 12/12/2019 0853   ALT 11 12/12/2019 0853   ALKPHOS 56 12/12/2019 0853   BILITOT 0.6 12/12/2019 0853   BILITOT <0.2 07/24/2019  1144   GFRNONAA 56 (L) 12/16/2019 0410   GFRAA >60 12/16/2019 0410   Lipase     Component Value Date/Time   LIPASE 20 12/09/2019 1626       Studies/Results: No results found.  Anti-infectives: Anti-infectives (From admission, onward)   Start     Dose/Rate Route Frequency Ordered Stop   12/11/19 0900  cefOXitin (MEFOXIN) 2 g in sodium chloride 0.9 % 100 mL IVPB     2 g 200 mL/hr over 30 Minutes Intravenous On call to O.R. 12/11/19 0845 12/11/19 1018       Assessment/Plan Recurrent ovarian cancer  s/p TAH BSO in 2018 on chemotherapy  H/o PE on eliquis COPD HTN  Colonic obstruction S/p diverting loop colostomy creation 12/30 Dr. Kieth Brightly - POD#5 - Add robaxin for muscle spasms. Continue soft diet and fiber supplementation. WOC RN consult for ostomy education. I will come back to evaluate stoma once pouch is off.  ID - none currently FEN - soft, K and Mag being replaced VTE - SCDs, Eliquis Foley - none    LOS: 6 days    Wellington Hampshire, [facility] 12/16/2019, 8:50 AM Please see Amion for pager number during day hours 7:00am-4:30pm

## 2019-12-17 DIAGNOSIS — Z9889 Other specified postprocedural states: Secondary | ICD-10-CM

## 2019-12-17 LAB — CBC
HCT: 24.8 % — ABNORMAL LOW (ref 36.0–46.0)
Hemoglobin: 8.2 g/dL — ABNORMAL LOW (ref 12.0–15.0)
MCH: 31.7 pg (ref 26.0–34.0)
MCHC: 33.1 g/dL (ref 30.0–36.0)
MCV: 95.8 fL (ref 80.0–100.0)
Platelets: 530 10*3/uL — ABNORMAL HIGH (ref 150–400)
RBC: 2.59 MIL/uL — ABNORMAL LOW (ref 3.87–5.11)
RDW: 18.8 % — ABNORMAL HIGH (ref 11.5–15.5)
WBC: 9.3 10*3/uL (ref 4.0–10.5)
nRBC: 0.4 % — ABNORMAL HIGH (ref 0.0–0.2)

## 2019-12-17 LAB — BASIC METABOLIC PANEL
Anion gap: 10 (ref 5–15)
BUN: <5 mg/dL — ABNORMAL LOW (ref 8–23)
CO2: 20 mmol/L — ABNORMAL LOW (ref 22–32)
Calcium: 8.1 mg/dL — ABNORMAL LOW (ref 8.9–10.3)
Chloride: 104 mmol/L (ref 98–111)
Creatinine, Ser: 0.79 mg/dL (ref 0.44–1.00)
GFR calc Af Amer: >60 mL/min (ref >60)
GFR calc non Af Amer: >60 mL/min (ref >60)
Glucose, Bld: 120 mg/dL — ABNORMAL HIGH (ref 70–99)
Potassium: 3.4 mmol/L — ABNORMAL LOW (ref 3.5–5.1)
Sodium: 134 mmol/L — ABNORMAL LOW (ref 135–145)

## 2019-12-17 LAB — MAGNESIUM: Magnesium: 1.9 mg/dL (ref 1.7–2.4)

## 2019-12-17 MED ORDER — HEPARIN SOD (PORK) LOCK FLUSH 100 UNIT/ML IV SOLN
500.0000 [IU] | INTRAVENOUS | Status: AC | PRN
  Administered 2019-12-17: 500 [IU]
  Filled 2019-12-17: qty 5

## 2019-12-17 MED ORDER — METHOCARBAMOL 750 MG PO TABS: 750.0000 mg | ORAL_TABLET | Freq: Four times a day (QID) | ORAL | 0 refills | Status: AC | PRN

## 2019-12-17 MED ORDER — POTASSIUM CHLORIDE 10 MEQ/100ML IV SOLN
10.0000 meq | INTRAVENOUS | Status: AC
  Administered 2019-12-17 (×4): 10 meq via INTRAVENOUS
  Filled 2019-12-17 (×4): qty 100

## 2019-12-17 MED ORDER — CALCIUM POLYCARBOPHIL 625 MG PO TABS: 625.0000 mg | ORAL_TABLET | Freq: Two times a day (BID) | ORAL | 0 refills | Status: DC

## 2019-12-17 MED ORDER — OXYCODONE HCL 5 MG PO TABS: 5.0000 mg | ORAL_TABLET | Freq: Four times a day (QID) | ORAL | 0 refills | Status: AC | PRN

## 2019-12-17 MED ORDER — SIMETHICONE 80 MG PO CHEW: 80.0000 mg | CHEWABLE_TABLET | Freq: Four times a day (QID) | ORAL | 0 refills | Status: AC

## 2019-12-17 MED FILL — oxyCODONE HCL 5 MG TABS: 5 | 7 days supply | Qty: 30 | Fill #0

## 2019-12-17 MED FILL — METHOCARBAMOL 750 MG TABS: 750 | 30 days supply | Qty: 120 | Fill #0

## 2019-12-17 NOTE — Progress Notes (Signed)
Subjective:   Pt seen at the bedside this AM. She is comfortable today, but still weary of the episodic abdominal pain. She states that has improved overall since surgery. We reassured the patient that we didn't think it was dangerous, but we would prescribe some medications to help her cope after discharge. Pt's daughter is coming in later and we will update her with the plan. All concerns were addressed.  Consults: surgery, palliative  Objective:  Vital signs in last 24 hours: Vitals:   12/16/19 0732 12/16/19 1601 12/16/19 2121 12/17/19 0606  BP:  (!) 116/54 (!) 116/50 (!) 108/54  Pulse: 82 78 88 79  Resp: 18 18  17   Temp:  98.5 F (36.9 C) 98.8 F (37.1 C) 98.5 F (36.9 C)  TempSrc:  Oral Oral Oral  SpO2: 97% 96% 97% 98%  Weight:      Height:       Physical Exam  Constitutional: No distress.  Cardiovascular: Normal rate, regular rhythm and intact distal pulses.  No murmur heard. Pulmonary/Chest: Effort normal. No respiratory distress.  Abdominal: Soft. Bowel sounds are normal. She exhibits no distension. There is no abdominal tenderness.  Ostomy in place with brown output  Neurological: She is alert.  Skin: She is not diaphoretic. There is pallor.  Nursing note and vitals reviewed.  I/Os:  Intake/Output Summary (Last 24 hours) at 12/17/2019 0711 Last data filed at 12/17/2019 0600 Gross per 24 hour  Intake 540 ml  Output 700 ml  Net -160 ml   Labs: Results for orders placed or performed during the hospital encounter of 12/09/19 (from the past 24 hour(s))  Basic metabolic panel     Status: Abnormal   Collection Time: 12/16/19  4:55 PM  Result Value Ref Range   Sodium 135 135 - 145 mmol/L   Potassium 3.2 (L) 3.5 - 5.1 mmol/L   Chloride 105 98 - 111 mmol/L   CO2 20 (L) 22 - 32 mmol/L   Glucose, Bld 109 (H) 70 - 99 mg/dL   BUN 5 (L) 8 - 23 mg/dL   Creatinine, Ser 0.83 0.44 - 1.00 mg/dL   Calcium 7.9 (L) 8.9 - 10.3 mg/dL   GFR calc non Af Amer >60 >60 mL/min   GFR calc Af Amer >60 >60 mL/min   Anion gap 10 5 - 15  CBC     Status: Abnormal   Collection Time: 12/17/19  4:15 AM  Result Value Ref Range   WBC 9.3 4.0 - 10.5 K/uL   RBC 2.59 (L) 3.87 - 5.11 MIL/uL   Hemoglobin 8.2 (L) 12.0 - 15.0 g/dL   HCT 24.8 (L) 36.0 - 46.0 %   MCV 95.8 80.0 - 100.0 fL   MCH 31.7 26.0 - 34.0 pg   MCHC 33.1 30.0 - 36.0 g/dL   RDW 18.8 (H) 11.5 - 15.5 %   Platelets 530 (H) 150 - 400 K/uL   nRBC 0.4 (H) 0.0 - 0.2 %  Basic metabolic panel     Status: Abnormal   Collection Time: 12/17/19  4:15 AM  Result Value Ref Range   Sodium 134 (L) 135 - 145 mmol/L   Potassium 3.4 (L) 3.5 - 5.1 mmol/L   Chloride 104 98 - 111 mmol/L   CO2 20 (L) 22 - 32 mmol/L   Glucose, Bld 120 (H) 70 - 99 mg/dL   BUN <5 (L) 8 - 23 mg/dL   Creatinine, Ser 0.79 0.44 - 1.00 mg/dL   Calcium 8.1 (L) 8.9 -  10.3 mg/dL   GFR calc non Af Amer >60 >60 mL/min   GFR calc Af Amer >60 >60 mL/min   Anion gap 10 5 - 15  Magnesium     Status: None   Collection Time: 12/17/19  4:15 AM  Result Value Ref Range   Magnesium 1.9 1.7 - 2.4 mg/dL    Assessment/Plan:  Assessment: Ms. Chiusano is a 74 yo F w/ a PMHx significant for COPD, HTN, hx of PE and metastatic ovarian cancer s/p TAH BSO in 2018 on chemotherapy who presented with a few weeks of increasing abdominal distention and new onset nausea and vomiting found to have a large bowel obstruction with abrupt transition point in the LUQ s/p surgery with ostomy creation.  Plan:  Active Problems:  Large bowel obstruction (HCC) -pt with known metastatic ovarian cancer s/p TAH BSO in 2018 currently on chemotherapy here with large bowel obstruction with abrupt transition point in LUQ now s/p surgery with ostomy creation -abdominal pain improved with medicine for gas and muscle spasms  Plan: -continue medicines for gas and spasms   HTN: -continue home bp meds: amlodipine 5 mg, labetalol 50 mg BID  Hx PE: -continue home eliquis   Dispo: Anticipated  discharge today.  Al Decant, MD 12/17/2019, 7:11 AM Pager: [phone]

## 2019-12-17 NOTE — Progress Notes (Signed)
[facility] Progress Note  6 Days Post-Op  Subjective: CC-  Continues to have some crampy abdominal pain, but states that it is less. Simethicone is helping. Denies n/v. Tolerating diet, although still does not have much of an appetite. Having bowel function.  Objective: Vital signs in last 24 hours: Temp:  [98.5 F (36.9 C)-98.8 F (37.1 C)] 98.5 F (36.9 C) (01/05 0606) Pulse Rate:  [78-88] 79 (01/05 0606) Resp:  [17-18] 17 (01/05 0829) BP: (108-116)/(50-54) 108/54 (01/05 0606) SpO2:  [96 %-98 %] 98 % (01/05 0606) Last BM Date: 12/16/19  Intake/Output from previous day: 01/04 0701 - 01/05 0700 In: 540 [P.O.:440; IV Piggyback:100] Out: 700 [Urine:300; Stool:400] Intake/Output this shift: No intake/output data recorded.  PE: Gen:  Alert, NAD, pleasant HEENT: EOM's intact, pupils equal and round Pulm:  Rate and effort normal Abd: Soft, mild distension, appropriately tender, +BS, Eakins pouch over ostomy/ ostomy retracted/ red rubber catheter x2 in place/ liquid brown stool in pouch Skin: no rashes noted, warm and dry  Lab Results:  Recent Labs    12/15/19 0435 12/17/19 0415  WBC 11.2* 9.3  HGB 8.3* 8.2*  HCT 25.4* 24.8*  PLT 454* 530*   BMET Recent Labs    12/16/19 1655 12/17/19 0415  NA 135 134*  K 3.2* 3.4*  CL 105 104  CO2 20* 20*  GLUCOSE 109* 120*  BUN 5* <5*  CREATININE 0.83 0.79  CALCIUM 7.9* 8.1*   PT/INR No results for input(s): LABPROT, INR in the last 72 hours. CMP     Component Value Date/Time   NA 134 (L) 12/17/2019 0415   NA 140 09/04/2019 1420   K 3.4 (L) 12/17/2019 0415   CL 104 12/17/2019 0415   CO2 20 (L) 12/17/2019 0415   GLUCOSE 120 (H) 12/17/2019 0415   BUN <5 (L) 12/17/2019 0415   BUN 12 09/04/2019 1420   CREATININE 0.79 12/17/2019 0415   CALCIUM 8.1 (L) 12/17/2019 0415   PROT 5.8 (L) 12/12/2019 0853   PROT 6.2 07/24/2019 1144   ALBUMIN 2.4 (L) 12/12/2019 0853   ALBUMIN 4.1 07/24/2019 1144   AST 14 (L)  12/12/2019 0853   ALT 11 12/12/2019 0853   ALKPHOS 56 12/12/2019 0853   BILITOT 0.6 12/12/2019 0853   BILITOT <0.2 07/24/2019 1144   GFRNONAA >60 12/17/2019 0415   GFRAA >60 12/17/2019 0415   Lipase     Component Value Date/Time   LIPASE 20 12/09/2019 1626       Studies/Results: No results found.  Anti-infectives: Anti-infectives (From admission, onward)   Start     Dose/Rate Route Frequency Ordered Stop   12/11/19 0900  cefOXitin (MEFOXIN) 2 g in sodium chloride 0.9 % 100 mL IVPB     2 g 200 mL/hr over 30 Minutes Intravenous On call to O.R. 12/11/19 0845 12/11/19 1018       Assessment/Plan Recurrent ovarian cancer  s/p TAH BSO in 2018 on chemotherapy  H/o PE on eliquis COPD HTN  Colonic obstruction S/p diverting loop colostomy creation 12/30 Dr. Kieth Brightly - POD#6 - tolerating diet, working on PO intake, ostomy functioning  ID - none currently FEN - soft, K being replaced VTE - SCDs, Eliquis Foley - none  Plan: Home health order placed for RN assistance with ostomy. Follow up arranged in our office 12/20/2019 to consider having red rubber catheters removed. [place] for discharge from surgical standpoint if patient and her daughter feel comfortable with ostomy care.   LOS: 7 days  Wellington Hampshire, [facility] 12/17/2019, 9:25 AM Please see Amion for pager number during day hours 7:00am-4:30pm

## 2019-12-17 NOTE — Plan of Care (Signed)
  Problem: Activity: Goal: Risk for activity intolerance will decrease Outcome: Progressing   Problem: Nutrition: Goal: Adequate nutrition will be maintained Outcome: Progressing   Problem: Coping: Goal: Level of anxiety will decrease Outcome: Progressing   Problem: Elimination: Goal: Will not experience complications related to bowel motility Outcome: Progressing Goal: Will not experience complications related to urinary retention Outcome: Progressing   Problem: Safety: Goal: Ability to remain free from injury will improve Outcome: Progressing   Problem: Pain Managment: Goal: General experience of comfort will improve Outcome: Progressing   Problem: Skin Integrity: Goal: Risk for impaired skin integrity will decrease Outcome: Progressing   Problem: Bowel/Gastric/Urinary: Goal: Gastrointestinal status for postoperative course will improve Outcome: Progressing   Problem: Health Behavior/Discharge Planning: Goal: Ability to manage health-related needs will improve Outcome: Progressing   Problem: Fluid Volume: Goal: Ability to achieve a balanced intake and output will improve Outcome: Progressing

## 2019-12-17 NOTE — Plan of Care (Signed)
  Problem: Education: Goal: Knowledge of General Education information will improve Description: Including pain rating scale, medication(s)/side effects and non-pharmacologic comfort measures Outcome: Progressing   Problem: Health Behavior/Discharge Planning: Goal: Ability to manage health-related needs will improve Outcome: Progressing   Problem: Clinical Measurements: Goal: Ability to maintain clinical measurements within normal limits will improve Outcome: Progressing Goal: Diagnostic test results will improve Outcome: Progressing   Problem: Elimination: Goal: Will not experience complications related to bowel motility Outcome: Progressing Goal: Will not experience complications related to urinary retention Outcome: Progressing   Problem: Pain Managment: Goal: General experience of comfort will improve Outcome: Progressing   Problem: Safety: Goal: Ability to remain free from injury will improve Outcome: Progressing   Problem: Skin Integrity: Goal: Risk for impaired skin integrity will decrease Outcome: Progressing

## 2019-12-17 NOTE — TOC Transition Note (Signed)
Transition of Care [facility]) - CM/SW Discharge Note   Patient Details  Name: Rhonda Hale MRN: [id number] Date of Birth: 1946/10/18  Transition of Care [facility]) CM/SW Contact:  Marilu Favre, RN Phone Number: 12/17/2019, 12:20 PM   Clinical Narrative:     Patient from home with [name] [id number] she would like [facility] to call her to arrange visits. Information provided to [facility].  Final next level of care: [place] Barriers to Discharge: No Barriers Identified   Patient Goals and CMS Choice Patient states their goals for this hospitalization and ongoing recovery are:: to return to home CMS Medicare.gov Compare Post Acute Care list provided to:: Patient Choice offered to / list presented to : Patient, Adult Children  Discharge Placement                       Discharge Plan and Services In-house Referral: Clinical Social Work Discharge Planning Services: CM Consult Post Acute Care Choice: Home Health          DME Arranged: N/A         HH Arranged: RN [place] Agency: [place] Date [facility] Agency Contacted: 12/17/19 Time HH Agency Contacted: 1219 Representative spoke with at [place]: [place] ([place]) Interventions     Readmission Risk Interventions Readmission Risk Prevention Plan 12/13/2019 12/10/2019 07/05/2019  Post Dischage Appt - - Complete  Medication Screening - - Complete  Transportation Screening Complete Complete Complete  PCP or Specialist Appt within 3-5 Days Complete Not Complete -  Not Complete comments - pending medical progression -  HRI or Home Care Consult Complete Complete -  Social Work Consult for [place] Planning/Counseling Complete Complete -  Palliative Care Screening Complete Not Complete -  Medication Review (RN Care Manager) Referral to Pharmacy Complete -  Some recent data might be hidden

## 2019-12-17 NOTE — Progress Notes (Signed)
AVS given and reviewed with pt. Medications discussed. All questions answered to satisfaction. Pt given ostomy supplies as requested by WOC. Pt verbalized understanding of information given. Pt to be escorted off the unit with all belongings via wheelchair by staff member.

## 2019-12-17 NOTE — Consult Note (Signed)
[place] Nurse ostomy follow up Patient receiving care in [facility] 6N12.  I spoke with her primary RN, Kristi by phone.  She has the supplies for her discharge in a bag outside the room for the patient's discharge.  I also requested that all the ostomy care supplies in the room be sent with the patient as well.  No new needs identified at this time. Val Riles, RN, MSN, CWOCN, CNS-BC, pager [phone]

## 2019-12-17 NOTE — Progress Notes (Signed)
[facility] Palliative  Referral received this hospitalization for palliative services in the community once she is discharged.  [facility] will continue to follow to anticipate d/c date to set up appointment to begin services.  Venia Carbon RN, BSN, [facility] Liaison (in [place])

## 2019-12-18 ENCOUNTER — Emergency Department (HOSPITAL_COMMUNITY): Payer: Medicare Other

## 2019-12-18 ENCOUNTER — Other Ambulatory Visit: Payer: Self-pay

## 2019-12-18 ENCOUNTER — Inpatient Hospital Stay (HOSPITAL_COMMUNITY)
Admission: EM | Admit: 2019-12-18 | Discharge: 2019-12-23 | DRG: 372 | Disposition: A | Discharge status: Home Health | Payer: Medicare Other | Attending: Internal Medicine | Admitting: Internal Medicine

## 2019-12-18 DIAGNOSIS — I10 Essential (primary) hypertension: Secondary | ICD-10-CM | POA: Diagnosis present

## 2019-12-18 DIAGNOSIS — G4700 Insomnia, unspecified: Secondary | ICD-10-CM | POA: Diagnosis present

## 2019-12-18 DIAGNOSIS — M62838 Other muscle spasm: Secondary | ICD-10-CM | POA: Diagnosis present

## 2019-12-18 DIAGNOSIS — Z86711 Personal history of pulmonary embolism: Secondary | ICD-10-CM

## 2019-12-18 DIAGNOSIS — Z7901 Long term (current) use of anticoagulants: Secondary | ICD-10-CM

## 2019-12-18 DIAGNOSIS — Z87891 Personal history of nicotine dependence: Secondary | ICD-10-CM

## 2019-12-18 DIAGNOSIS — E890 Postprocedural hypothyroidism: Secondary | ICD-10-CM | POA: Diagnosis present

## 2019-12-18 DIAGNOSIS — C569 Malignant neoplasm of unspecified ovary: Secondary | ICD-10-CM | POA: Diagnosis present

## 2019-12-18 DIAGNOSIS — R5082 Postprocedural fever: Secondary | ICD-10-CM

## 2019-12-18 DIAGNOSIS — Z803 Family history of malignant neoplasm of breast: Secondary | ICD-10-CM

## 2019-12-18 DIAGNOSIS — F329 Major depressive disorder, single episode, unspecified: Secondary | ICD-10-CM | POA: Diagnosis present

## 2019-12-18 DIAGNOSIS — Z933 Colostomy status: Secondary | ICD-10-CM

## 2019-12-18 DIAGNOSIS — E872 Acidosis: Secondary | ICD-10-CM | POA: Diagnosis present

## 2019-12-18 DIAGNOSIS — R509 Fever, unspecified: Secondary | ICD-10-CM

## 2019-12-18 DIAGNOSIS — A0472 Enterocolitis due to Clostridium difficile, not specified as recurrent: Secondary | ICD-10-CM | POA: Diagnosis not present

## 2019-12-18 DIAGNOSIS — Z79891 Long term (current) use of opiate analgesic: Secondary | ICD-10-CM

## 2019-12-18 DIAGNOSIS — Z9049 Acquired absence of other specified parts of digestive tract: Secondary | ICD-10-CM

## 2019-12-18 DIAGNOSIS — Z9842 Cataract extraction status, left eye: Secondary | ICD-10-CM

## 2019-12-18 DIAGNOSIS — R109 Unspecified abdominal pain: Secondary | ICD-10-CM

## 2019-12-18 DIAGNOSIS — R410 Disorientation, unspecified: Secondary | ICD-10-CM

## 2019-12-18 DIAGNOSIS — Z79899 Other long term (current) drug therapy: Secondary | ICD-10-CM

## 2019-12-18 DIAGNOSIS — Z90710 Acquired absence of both cervix and uterus: Secondary | ICD-10-CM

## 2019-12-18 DIAGNOSIS — E876 Hypokalemia: Secondary | ICD-10-CM | POA: Diagnosis present

## 2019-12-18 DIAGNOSIS — Z888 Allergy status to other drugs, medicaments and biological substances status: Secondary | ICD-10-CM

## 2019-12-18 DIAGNOSIS — J449 Chronic obstructive pulmonary disease, unspecified: Secondary | ICD-10-CM | POA: Diagnosis present

## 2019-12-18 DIAGNOSIS — K5100 Ulcerative (chronic) pancolitis without complications: Secondary | ICD-10-CM | POA: Diagnosis present

## 2019-12-18 DIAGNOSIS — Z20822 Contact with and (suspected) exposure to covid-19: Secondary | ICD-10-CM | POA: Diagnosis present

## 2019-12-18 DIAGNOSIS — C799 Secondary malignant neoplasm of unspecified site: Secondary | ICD-10-CM | POA: Diagnosis present

## 2019-12-18 DIAGNOSIS — Z9841 Cataract extraction status, right eye: Secondary | ICD-10-CM

## 2019-12-18 DIAGNOSIS — Z9081 Acquired absence of spleen: Secondary | ICD-10-CM

## 2019-12-18 DIAGNOSIS — Z79890 Hormone replacement therapy (postmenopausal): Secondary | ICD-10-CM

## 2019-12-18 DIAGNOSIS — Z90722 Acquired absence of ovaries, bilateral: Secondary | ICD-10-CM

## 2019-12-18 DIAGNOSIS — F05 Delirium due to known physiological condition: Secondary | ICD-10-CM | POA: Diagnosis present

## 2019-12-18 LAB — CBC WITH DIFFERENTIAL/PLATELET
Abs Immature Granulocytes: 0.19 10*3/uL — ABNORMAL HIGH (ref 0.00–0.07)
Basophils Absolute: 0 10*3/uL (ref 0.0–0.1)
Basophils Relative: 0 %
Eosinophils Absolute: 0.2 10*3/uL (ref 0.0–0.5)
Eosinophils Relative: 2 %
HCT: 25.4 % — ABNORMAL LOW (ref 36.0–46.0)
Hemoglobin: 8.6 g/dL — ABNORMAL LOW (ref 12.0–15.0)
Immature Granulocytes: 2 %
Lymphocytes Relative: 9 %
Lymphs Abs: 1 10*3/uL (ref 0.7–4.0)
MCH: 31.5 pg (ref 26.0–34.0)
MCHC: 33.9 g/dL (ref 30.0–36.0)
MCV: 93 fL (ref 80.0–100.0)
Monocytes Absolute: 1.8 10*3/uL — ABNORMAL HIGH (ref 0.1–1.0)
Monocytes Relative: 17 %
Neutro Abs: 7.4 10*3/uL (ref 1.7–7.7)
Neutrophils Relative %: 70 %
Platelets: 540 10*3/uL — ABNORMAL HIGH (ref 150–400)
RBC: 2.73 MIL/uL — ABNORMAL LOW (ref 3.87–5.11)
RDW: 18 % — ABNORMAL HIGH (ref 11.5–15.5)
WBC: 10.6 10*3/uL — ABNORMAL HIGH (ref 4.0–10.5)
nRBC: 0.2 % (ref 0.0–0.2)

## 2019-12-18 LAB — COMPREHENSIVE METABOLIC PANEL
ALT: 15 U/L (ref 0–44)
AST: 17 U/L (ref 15–41)
Albumin: 2.2 g/dL — ABNORMAL LOW (ref 3.5–5.0)
Alkaline Phosphatase: 64 U/L (ref 38–126)
Anion gap: 9 (ref 5–15)
BUN: 5 mg/dL — ABNORMAL LOW (ref 8–23)
CO2: 20 mmol/L — ABNORMAL LOW (ref 22–32)
Calcium: 8.9 mg/dL (ref 8.9–10.3)
Chloride: 108 mmol/L (ref 98–111)
Creatinine, Ser: 0.92 mg/dL (ref 0.44–1.00)
GFR calc Af Amer: >60 mL/min (ref >60)
GFR calc non Af Amer: >60 mL/min (ref >60)
Glucose, Bld: 143 mg/dL — ABNORMAL HIGH (ref 70–99)
Potassium: 3.1 mmol/L — ABNORMAL LOW (ref 3.5–5.1)
Sodium: 137 mmol/L (ref 135–145)
Total Bilirubin: 0.7 mg/dL (ref 0.3–1.2)
Total Protein: 5.6 g/dL — ABNORMAL LOW (ref 6.5–8.1)

## 2019-12-18 LAB — LACTATE DEHYDROGENASE: LDH: 169 U/L (ref 98–192)

## 2019-12-18 LAB — LACTIC ACID, PLASMA: Lactic Acid, Venous: 1 mmol/L (ref 0.5–1.9)

## 2019-12-18 LAB — TRIGLYCERIDES: Triglycerides: 120 mg/dL (ref <150)

## 2019-12-18 LAB — D-DIMER, QUANTITATIVE: D-Dimer, Quant: 11.27 ug/mL-FEU — ABNORMAL HIGH (ref 0.00–0.50)

## 2019-12-18 LAB — PROCALCITONIN: Procalcitonin: 1.15 ng/mL

## 2019-12-18 LAB — C-REACTIVE PROTEIN: CRP: 14.7 mg/dL — ABNORMAL HIGH (ref <1.0)

## 2019-12-18 LAB — FIBRINOGEN: Fibrinogen: 798 mg/dL — ABNORMAL HIGH (ref 210–475)

## 2019-12-18 LAB — POC SARS CORONAVIRUS 2 AG -  ED: SARS Coronavirus 2 Ag: NEGATIVE

## 2019-12-18 LAB — FERRITIN: Ferritin: 783 ng/mL — ABNORMAL HIGH (ref 11–307)

## 2019-12-18 NOTE — ED Triage Notes (Signed)
Pt presents from home, per family had a fever and became altered at 1600 today. A medic arrived, noted temp 104F, provided 1000mg  acetaminophen, decreased to 100.26F just prior to arrival to ED. Normally A&Ox4, A&Ox2 with EMS (person, situation). 750cc NS provdide.   VS 163/64, P103, RR38, 97% RA, capnography 22  H/o December admission with colostomy placed for bowel obstruction, ovarian CA (last chemo 11/23), COPD, thyroidectomy. Cholecystectomy  Allergies: atorvastatin

## 2019-12-18 NOTE — ED Provider Notes (Addendum)
[facility] EMERGENCY DEPARTMENT Provider Note   CSN: [id number] Arrival date & time: 12/18/19  1821     History Chief Complaint  Patient presents with  . Code Sepsis    Rhonda Hale is a 74 y.o. female.  HPI Patient was discharged from the hospital yesterday.  Patient has known metastatic ovarian cancer status post TAH/BSO in 2018 currently on chemotherapy.  She presented with large bowel obstruction and had colostomy 12/11/2019. She developed fever to 101, increased HR and some confusion. They were instructed to present to the ED for evaluation. Patient has limited complaints at this time. She is mildly confused but situationally appropriate.     Past Medical History:  Diagnosis Date  . Cancer ([place])   . COPD (chronic obstructive pulmonary disease) ([place])   . Hypertension   . PRES (posterior reversible encephalopathy syndrome)   . SBO (small bowel obstruction) ([place]) 11/2019    Patient Active Problem List   Diagnosis Date Noted  . Palliative care encounter   . Colonic obstruction ([place]) 12/10/2019  . Adrenal insufficiency ([place]) 09/05/2019  . Hypotension 08/21/2019  . Toxic metabolic encephalopathy 0000000  . Hyponatremia 07/31/2019  . AMS (altered mental status) 07/27/2019  . Dehydration 07/26/2019  . PRES (posterior reversible encephalopathy syndrome) 07/07/2019  . AKI (acute kidney injury) ([place]) 07/03/2019  . Hypercalcemia 07/03/2019  . Hypomagnesemia, chronic 07/03/2019  . Anticoagulated 04/15/2019  . Ovary cancer ([place]) 07/03/2017  . Postoperative hypothyroidism 01/01/2013    Past Surgical History:  Procedure Laterality Date  . ABDOMINAL HYSTERECTOMY N/A   . CATARACT EXTRACTION, BILATERAL    . CHOLECYSTECTOMY    . COLON RESECTION N/A 12/11/2019   Procedure: OPEN DIVERTED COLOSTOMY;  Surgeon: Kinsinger, Arta Bruce, MD;  Location: [facility];  Service: General;  Laterality: N/A;  . HIP SURGERY Right   . LEG SURGERY Left   . LUNG SURGERY    .  SPLENECTOMY, TOTAL    . THYROIDECTOMY       OB History   No obstetric history on file.     No family history on file.  Social History   Tobacco Use  . Smoking status: Former Smoker    Years: 20.00    Types: Cigarettes  . Smokeless tobacco: Never Used  . Tobacco comment: quit yrs ago  Substance Use Topics  . Alcohol use: Not Currently  . Drug use: Never    Home Medications Prior to Admission medications   Medication Sig Start Date End Date Taking? Authorizing Provider  acetaminophen (TYLENOL) 325 MG tablet Take 2 tablets (650 mg total) by mouth every 6 (six) hours as needed for mild pain (or Fever >/= 101). 08/08/19   Angiulli, Lavon Paganini, PA-C  amLODipine (NORVASC) 5 MG tablet Take 1 tablet (5 mg total) by mouth daily. 11/06/19   Axel Filler, MD  apixaban (ELIQUIS) 5 MG TABS tablet Take 1 tablet (5 mg total) by mouth 2 (two) times daily. 08/10/19   Meredith Staggers, MD  calcitRIOL (ROCALTROL) 0.5 MCG capsule Take 1 capsule (0.5 mcg total) by mouth daily. 08/09/19   Angiulli, Lavon Paganini, PA-C  Calcium Carb-Cholecalciferol (CALCIUM-VITAMIN D) 600-400 MG-UNIT TABS Take 1 tablet by mouth 2 (two) times a day.    [provider]  escitalopram (LEXAPRO) 20 MG tablet Take 1 tablet (20 mg total) by mouth daily. 08/09/19   Angiulli, Lavon Paganini, PA-C  labetalol (NORMODYNE) 100 MG tablet Take 0.5 tablets (50 mg total) by mouth 2 (two) times daily. 11/24/19  Marianna Payment, MD  levothyroxine (SYNTHROID) 150 MCG tablet Take 1 tablet (150 mcg total) by mouth daily before breakfast. 09/04/19 12/09/28  Lars Mage, MD  megestrol (MEGACE) 20 MG tablet Take 2 tablets (40 mg total) by mouth 2 (two) times daily. 08/08/19   Angiulli, Lavon Paganini, PA-C  methocarbamol (ROBAXIN) 750 MG tablet Take 1 tablet (750 mg total) by mouth every 6 (six) hours as needed for muscle spasms. 12/17/19   Al Decant, MD  Multiple Vitamin (MULTIVITAMIN WITH MINERALS) TABS tablet Take 1 tablet by mouth  daily. Patient not taking: Reported on 12/10/2019 08/09/19   Angiulli, Lavon Paganini, PA-C  oxyCODONE (OXY IR/ROXICODONE) 5 MG immediate release tablet Take 1 tablet (5 mg total) by mouth every 6 (six) hours as needed for moderate pain or severe pain. 12/17/19   Al Decant, MD  polycarbophil (FIBERCON) 625 MG tablet Take 1 tablet (625 mg total) by mouth 2 (two) times daily. 12/17/19   Al Decant, MD  QUEtiapine (SEROQUEL) 25 MG tablet Take 1 tablet (25 mg total) by mouth at bedtime. Patient taking differently: Take 12.5 mg by mouth at bedtime.  09/04/19 12/09/28  Lars Mage, MD  simethicone (MYLICON) 80 MG chewable tablet Chew 1 tablet (80 mg total) by mouth 4 (four) times daily. 12/17/19   Al Decant, MD  umeclidinium bromide (INCRUSE ELLIPTA) 62.5 MCG/INH AEPB Inhale 1 puff into the lungs daily. 09/23/19   Forde Dandy, PharmD    Allergies    Atorvastatin  Review of Systems   Review of Systems 10 Systems reviewed and are negative for acute change except as noted in the HPI. Physical Exam Updated Vital Signs BP (!) 112/59   Pulse 91   Temp 99.9 F (37.7 C) (Oral)   Resp (!) 25   SpO2 95%   Physical Exam Constitutional:      Comments: Alert, no acute distress, no resp distress  HENT:     Head: Normocephalic and atraumatic.  Eyes:     Extraocular Movements: Extraocular movements intact.     Conjunctiva/sclera: Conjunctivae normal.  Cardiovascular:     Comments: Borderline tachycardia, regular Pulmonary:     Effort: Pulmonary effort is normal.     Breath sounds: Normal breath sounds.  Abdominal:     Comments: Soft, mild tenderness superior to colostomy, no guarding, no sig distension,copious liquid brown/yellow stool in colostomy  Musculoskeletal:        General: Normal range of motion.     Cervical back: Neck supple.  Skin:    General: Skin is warm and dry.  Neurological:     General: No focal deficit present.     Coordination: Coordination normal.  Psychiatric:         Mood and Affect: Mood normal.     ED Results / Procedures / Treatments   Labs (all labs ordered are listed, but only abnormal results are displayed) Labs Reviewed  CULTURE, BLOOD (ROUTINE X 2)  CULTURE, BLOOD (ROUTINE X 2)  LACTIC ACID, PLASMA  LACTIC ACID, PLASMA  CBC WITH DIFFERENTIAL/PLATELET  COMPREHENSIVE METABOLIC PANEL  D-DIMER, QUANTITATIVE (NOT AT [facility])  PROCALCITONIN  LACTATE DEHYDROGENASE  FERRITIN  TRIGLYCERIDES  FIBRINOGEN  C-REACTIVE PROTEIN  POC SARS CORONAVIRUS 2 AG -  ED    EKG EKG Interpretation  Date/Time:  Wednesday December 18 2019 18:21:47 EST Ventricular Rate:  95 PR Interval:    QRS Duration: 68 QT Interval:  407 QTC Calculation: 512 R Axis:   -18 Text Interpretation: Sinus rhythm Atrial premature  complex Borderline left axis deviation Low voltage, precordial leads Borderline T wave abnormalities Prolonged QT interval When compared with ECG of 12/10/2019, QT has shortened Confirmed by Delora Fuel ([id number]) on 12/19/2019 3:29:35 AM   Radiology No results found.  Procedures Procedures (including critical care time) CRITICAL CARE Performed by: Charlesetta Shanks   Total critical care time: 30  minutes  Critical care time was exclusive of separately billable procedures and treating other patients.  Critical care was necessary to treat or prevent imminent or life-threatening deterioration.  Critical care was time spent personally by me on the following activities: development of treatment plan with patient and/or surrogate as well as nursing, discussions with consultants, evaluation of patient's response to treatment, examination of patient, obtaining history from patient or surrogate, ordering and performing treatments and interventions, ordering and review of laboratory studies, ordering and review of radiographic studies, pulse oximetry and re-evaluation of patient's condition. Medications Ordered in ED Medications - No data to display  ED  Course  I have reviewed the triage vital signs and the nursing notes.  Pertinent labs & imaging results that were available during my care of the patient were reviewed by me and considered in my medical decision making (see chart for details).  Clinical Course as of Dec 18 8  Wed Dec 18, 2019  2144 Consult: Admitted to internal medicine resident teaching service   [MP]    Clinical Course User Index [MP] Charlesetta Shanks, MD   MDM Rules/Calculators/A&P                     Patient ill with fever and confusion. Post operative with new colostomy. Will need evaluation for usual post operative caused of fever as well as covid. Patien to be admitted to Internal Medicine service.  Evelina Mcmackin was evaluated in Emergency Department on 12/23/2019 for the symptoms described in the history of present illness. She was evaluated in the context of the global COVID-19 pandemic, which necessitated consideration that the patient might be at risk for infection with the SARS-CoV-2 virus that causes COVID-19. Institutional protocols and algorithms that pertain to the evaluation of patients at risk for COVID-19 are in a state of rapid change based on information released by regulatory bodies including the CDC and federal and state organizations. These policies and algorithms were followed during the patient's care in the ED. Final Clinical Impression(s) / ED Diagnoses Final diagnoses:  Abdominal pain  Postprocedural fever  Subacute confusional state    Rx / DC Orders ED Discharge Orders    None       Charlesetta Shanks, MD 12/23/19 [place], MD 12/23/19 1209

## 2019-12-18 NOTE — ED Notes (Signed)
Lab called, blue top clotted, tube redrawn and sent

## 2019-12-18 NOTE — ED Notes (Signed)
Pt and family aware of plan for oral contrast, repositioned in bed to be able to drink fluids. Pt alert.

## 2019-12-18 NOTE — H&P (Addendum)
Date: 12/19/2019               Patient Name:  Rhonda Hale MRN: [id number]  DOB: 01/15/1946 Age / Sex: 74 y.o., female   PCP: Marianna Payment, MD         Medical Service: Internal Medicine Teaching Service         Attending Physician: Dr. Charlesetta Shanks, MD    First Contact: Dr. Madilyn Fireman Pager: [phone]  Second Contact: Dr. Eileen Stanford Pager: [phone]       After Hours (After 5p/  First Contact Pager: [phone]  weekends / holidays): Second Contact Pager: [phone]   Chief Complaint: Fever and altered mental status  History of Present Illness: Patient is a 74 year old female with past medical history of COPD, hypertension, hypothyroidism, ovarian cancer who presented after episode of fever and altered mental status.  Per patient's daughter, patient reported increasing abdominal pain at 2 PM today, she was given Percocet without significant relief.  Pain was located in the left upper quadrant, she was tender on the daughter's exam.  Patient had temperature up to 101 F measured axillary, respiratory rate of 36, pulse in 120s.  Patient became altered - did not recognize daughter, thought it was 52s.  Daughter called surgery clinic, was advised to bring patient into the emergency department for evaluation.  Upon evaluation by EMS, temperature was 104 F measured on forehead.  Daughter reports that patient has had mostly liquid brown output from her colostomy, slightly decreased today from yesterday. Cough and shortness of breath associated with her abdominal pain.  Patient states that currently her abdominal pain is improved, denies chest pain, shortness of breath.  Patient denies changes in urination, headache, neck stiffness.  Meds:  * Tylenol * Amlodipine 5 mg daily *Eliquis 5 mg twice daily *Calcitriol 0.5 mg capsule once daily *Calcium-vitamin D *Escitalopram 20 mg tablet once daily *Labetalol 50 mg twice daily *Levothyroxine 150 mcg daily *Megace 40 mg twice daily *Robaxin-750 milligrams  every 6 hours as needed *Oxycodone 5 mg every 6 hours as needed *FiberCon 6 and 25 mg twice daily *Quetiapine at bedtime *Simethicone 80 mg by mouth 4 times daily *Incruse Ellipta once daily  Allergies: Allergies as of 12/18/2019 - Review Complete 12/18/2019  Allergen Reaction Noted  . Atorvastatin Other (See Comments) 06/21/2017   Past Medical History:  Diagnosis Date  . Cancer ([place])   . COPD (chronic obstructive pulmonary disease) ([place])   . Hypertension   . PRES (posterior reversible encephalopathy syndrome)   . SBO (small bowel obstruction) ([place]) 11/2019    Family History: Mother had post-menopausal breast cancer  Social History:  Social History   Tobacco Use  . Smoking status: Former Smoker    Years: 20.00    Types: Cigarettes  . Smokeless tobacco: Never Used  . Tobacco comment: quit yrs ago  Substance Use Topics  . Alcohol use: Not Currently  . Drug use: Never    Review of Systems: A complete ROS was negative except as per HPI.   Physical Exam: Blood pressure 122/61, pulse 90, temperature 99.9 F (37.7 C), temperature source Axillary, resp. rate (!) 25, SpO2 93 %. Physical Exam  Constitutional: She is well-developed, well-nourished, and in no distress.  HENT:  Head: Normocephalic and atraumatic.  Eyes: EOM are normal. Right eye exhibits no discharge. Left eye exhibits no discharge.  Neck: No tracheal deviation present.  Cardiovascular: Normal rate and regular rhythm. Exam reveals no gallop and no friction rub.  No murmur  heard. Pulmonary/Chest: Effort normal and breath sounds normal. No respiratory distress. She has no wheezes. She has no rales.  Abdominal: Soft. She exhibits no distension. There is abdominal tenderness (Mild tenderness to palpation in right lower quadrant). There is no rebound and no guarding.  Musculoskeletal:        General: No tenderness, deformity or edema. Normal range of motion.     Cervical back: Normal range of motion.   Neurological: Coordination normal.  Alert and oriented, answers questions appropriatey Strength equal and in tact bilaterally  Skin: Skin is warm and dry. No rash noted. She is not diaphoretic. No erythema.  Psychiatric: Memory and judgment normal.    EKG: personally reviewed my interpretation is sinus rhythm without signs of acute ischemia  CXR: personally reviewed my interpretation is no acute cardiopulmonary process  Assessment & Plan by Problem: Active Problems:   Fever   Abdominal pain  Patient is a 74 year old female with past medical history of COPD, hypertension, hypothyroidism, ovarian cancer who presented after episode of fever and altered mental status.    # Fever: # Abdominal pain:  Patient febrile to 104 F accompanied by altered mental status and abdominal pain.  Patient oriented and afebrile upon presentation.  Patient discharged on 1/5 following admission for large bowel obstruction that is status post diverting loop colostomy on 12/30 by Dr. Kieth Brightly.  Considering possible abdominal abscess given abdominal pain, fevers, and recent surgery. Patient is without current respiratory symptoms and CXR without infiltrate. Patient is without headache, neck pain, has negative Brudzinski's sign. Patient does not appear septic and has normal lactic acid, heart rate WNL, mild tachypnea, and is normotensive. Patient does have elevated inflammatory markers with CRP of 14.7, Ferritin of 783, fibrinogen of 798, D-dimer of 11.27. * CT abd/pelvis with contrast ordered * Will check urinalysis/culture, possible frequency, no other urinary symptoms * Blood cultures x2 drawn in ER, will follow * No indications for antibiotics at this time * POC COVID negative, will check PCR * NPO - hold PO meds while NPO * Will plan to continue home megace + simethicone once diet initiated  # Hypertension: Patient with low-normal blood pressures. In setting of possible infection, will hold home labetalol  and amlodipine for now  # Hypokalemia: K of 3.1, will replete IV  # Hypothyroidism:  Resume home synthroid - 150 mcg daily  # History of PE: Eliquis 5 mg twice daily  # Muscle spasms: Will hold home robaxin for now given recent AMS  # Insomnia: Will hold home quetiapine for now given recent AMS  # Depression: Will hold home lexapro for now given recent AMS   Dispo: Admit patient to Observation with expected length of stay less than 2 midnights.  Signed: Jeanmarie Hubert, MD 12/19/2019, 12:06 AM

## 2019-12-18 NOTE — ED Notes (Addendum)
Rhonda Hale [phone]  Daughter would like to be notified when she can come back top visit pt due to cognitive reasons

## 2019-12-19 ENCOUNTER — Other Ambulatory Visit (HOSPITAL_COMMUNITY): Payer: Self-pay | Admitting: Radiology

## 2019-12-19 ENCOUNTER — Other Ambulatory Visit: Payer: Self-pay

## 2019-12-19 ENCOUNTER — Observation Stay (HOSPITAL_COMMUNITY): Payer: Medicare Other

## 2019-12-19 DIAGNOSIS — Z90710 Acquired absence of both cervix and uterus: Secondary | ICD-10-CM | POA: Diagnosis not present

## 2019-12-19 DIAGNOSIS — Z90722 Acquired absence of ovaries, bilateral: Secondary | ICD-10-CM | POA: Diagnosis not present

## 2019-12-19 DIAGNOSIS — E872 Acidosis: Secondary | ICD-10-CM | POA: Diagnosis present

## 2019-12-19 DIAGNOSIS — D72829 Elevated white blood cell count, unspecified: Secondary | ICD-10-CM | POA: Diagnosis not present

## 2019-12-19 DIAGNOSIS — K5100 Ulcerative (chronic) pancolitis without complications: Secondary | ICD-10-CM | POA: Diagnosis not present

## 2019-12-19 DIAGNOSIS — E876 Hypokalemia: Secondary | ICD-10-CM | POA: Diagnosis present

## 2019-12-19 DIAGNOSIS — M62838 Other muscle spasm: Secondary | ICD-10-CM | POA: Diagnosis present

## 2019-12-19 DIAGNOSIS — Z933 Colostomy status: Secondary | ICD-10-CM | POA: Diagnosis not present

## 2019-12-19 DIAGNOSIS — J449 Chronic obstructive pulmonary disease, unspecified: Secondary | ICD-10-CM | POA: Diagnosis present

## 2019-12-19 DIAGNOSIS — F329 Major depressive disorder, single episode, unspecified: Secondary | ICD-10-CM | POA: Diagnosis present

## 2019-12-19 DIAGNOSIS — C799 Secondary malignant neoplasm of unspecified site: Secondary | ICD-10-CM | POA: Diagnosis present

## 2019-12-19 DIAGNOSIS — K56609 Unspecified intestinal obstruction, unspecified as to partial versus complete obstruction: Secondary | ICD-10-CM | POA: Diagnosis not present

## 2019-12-19 DIAGNOSIS — Z888 Allergy status to other drugs, medicaments and biological substances status: Secondary | ICD-10-CM | POA: Diagnosis not present

## 2019-12-19 DIAGNOSIS — Z7901 Long term (current) use of anticoagulants: Secondary | ICD-10-CM | POA: Diagnosis not present

## 2019-12-19 DIAGNOSIS — Z86711 Personal history of pulmonary embolism: Secondary | ICD-10-CM | POA: Diagnosis not present

## 2019-12-19 DIAGNOSIS — C569 Malignant neoplasm of unspecified ovary: Secondary | ICD-10-CM | POA: Diagnosis present

## 2019-12-19 DIAGNOSIS — Z20822 Contact with and (suspected) exposure to covid-19: Secondary | ICD-10-CM | POA: Diagnosis present

## 2019-12-19 DIAGNOSIS — G4700 Insomnia, unspecified: Secondary | ICD-10-CM | POA: Diagnosis present

## 2019-12-19 DIAGNOSIS — Z803 Family history of malignant neoplasm of breast: Secondary | ICD-10-CM | POA: Diagnosis not present

## 2019-12-19 DIAGNOSIS — I10 Essential (primary) hypertension: Secondary | ICD-10-CM | POA: Diagnosis present

## 2019-12-19 DIAGNOSIS — R1084 Generalized abdominal pain: Secondary | ICD-10-CM

## 2019-12-19 DIAGNOSIS — R109 Unspecified abdominal pain: Secondary | ICD-10-CM

## 2019-12-19 DIAGNOSIS — Z79891 Long term (current) use of opiate analgesic: Secondary | ICD-10-CM | POA: Diagnosis not present

## 2019-12-19 DIAGNOSIS — Z79899 Other long term (current) drug therapy: Secondary | ICD-10-CM | POA: Diagnosis not present

## 2019-12-19 DIAGNOSIS — Z87891 Personal history of nicotine dependence: Secondary | ICD-10-CM | POA: Diagnosis not present

## 2019-12-19 DIAGNOSIS — F05 Delirium due to known physiological condition: Secondary | ICD-10-CM | POA: Diagnosis present

## 2019-12-19 DIAGNOSIS — A0472 Enterocolitis due to Clostridium difficile, not specified as recurrent: Secondary | ICD-10-CM | POA: Diagnosis present

## 2019-12-19 DIAGNOSIS — Z79890 Hormone replacement therapy (postmenopausal): Secondary | ICD-10-CM | POA: Diagnosis not present

## 2019-12-19 LAB — CBC
HCT: 24.1 % — ABNORMAL LOW (ref 36.0–46.0)
Hemoglobin: 7.9 g/dL — ABNORMAL LOW (ref 12.0–15.0)
MCH: 30.9 pg (ref 26.0–34.0)
MCHC: 32.8 g/dL (ref 30.0–36.0)
MCV: 94.1 fL (ref 80.0–100.0)
Platelets: 550 10*3/uL — ABNORMAL HIGH (ref 150–400)
RBC: 2.56 MIL/uL — ABNORMAL LOW (ref 3.87–5.11)
RDW: 18.1 % — ABNORMAL HIGH (ref 11.5–15.5)
WBC: 14.5 10*3/uL — ABNORMAL HIGH (ref 4.0–10.5)
nRBC: 0.1 % (ref 0.0–0.2)

## 2019-12-19 LAB — URINALYSIS, ROUTINE W REFLEX MICROSCOPIC
Bacteria, UA: NONE SEEN
Bilirubin Urine: NEGATIVE
Glucose, UA: NEGATIVE mg/dL
Ketones, ur: NEGATIVE mg/dL
Leukocytes,Ua: NEGATIVE
Nitrite: NEGATIVE
Protein, ur: NEGATIVE mg/dL
Specific Gravity, Urine: 1.039 — ABNORMAL HIGH (ref 1.005–1.030)
pH: 6 (ref 5.0–8.0)

## 2019-12-19 LAB — BASIC METABOLIC PANEL
Anion gap: 10 (ref 5–15)
BUN: <5 mg/dL — ABNORMAL LOW (ref 8–23)
CO2: 22 mmol/L (ref 22–32)
Calcium: 8.7 mg/dL — ABNORMAL LOW (ref 8.9–10.3)
Chloride: 104 mmol/L (ref 98–111)
Creatinine, Ser: 0.95 mg/dL (ref 0.44–1.00)
GFR calc Af Amer: >60 mL/min (ref >60)
GFR calc non Af Amer: 59 mL/min — ABNORMAL LOW (ref >60)
Glucose, Bld: 94 mg/dL (ref 70–99)
Potassium: 3.4 mmol/L — ABNORMAL LOW (ref 3.5–5.1)
Sodium: 136 mmol/L (ref 135–145)

## 2019-12-19 LAB — C DIFFICILE QUICK SCREEN W PCR REFLEX
C Diff antigen: POSITIVE — AB
C Diff toxin: NEGATIVE

## 2019-12-19 LAB — MAGNESIUM: Magnesium: 1.2 mg/dL — ABNORMAL LOW (ref 1.7–2.4)

## 2019-12-19 LAB — CBG MONITORING, ED: Glucose-Capillary: 78 mg/dL (ref 70–99)

## 2019-12-19 LAB — CLOSTRIDIUM DIFFICILE BY PCR, REFLEXED: Toxigenic C. Difficile by PCR: POSITIVE — AB

## 2019-12-19 LAB — SARS CORONAVIRUS 2 (TAT 6-24 HRS): SARS Coronavirus 2: NEGATIVE

## 2019-12-19 MED ORDER — AMLODIPINE BESYLATE 5 MG PO TABS: 5.0000 mg | ORAL_TABLET | Freq: Every day | ORAL | Status: DC

## 2019-12-19 MED ORDER — VANCOMYCIN 50 MG/ML ORAL SOLUTION
125.0000 mg | Freq: Four times a day (QID) | ORAL | Status: DC
  Administered 2019-12-19 – 2019-12-23 (×16): 125 mg via ORAL
  Filled 2019-12-19 (×19): qty 2.5

## 2019-12-19 MED ORDER — OXYCODONE HCL 5 MG PO TABS
5.0000 mg | ORAL_TABLET | Freq: Four times a day (QID) | ORAL | Status: DC | PRN
  Administered 2019-12-19 – 2019-12-23 (×8): 5 mg via ORAL
  Filled 2019-12-19 (×8): qty 1

## 2019-12-19 MED ORDER — APIXABAN 5 MG PO TABS
5.0000 mg | ORAL_TABLET | Freq: Two times a day (BID) | ORAL | Status: DC
  Administered 2019-12-19: 5 mg via ORAL
  Filled 2019-12-19 (×2): qty 1

## 2019-12-19 MED ORDER — HEPARIN (PORCINE) 25000 UT/250ML-% IV SOLN
1250.0000 [IU]/h | INTRAVENOUS | Status: DC
  Administered 2019-12-19 – 2019-12-20 (×3): 1000 [IU]/h via INTRAVENOUS
  Administered 2019-12-21 – 2019-12-22 (×2): 1100 [IU]/h via INTRAVENOUS
  Filled 2019-12-19 (×6): qty 250

## 2019-12-19 MED ORDER — ONDANSETRON HCL 4 MG/2ML IJ SOLN
4.0000 mg | Freq: Four times a day (QID) | INTRAMUSCULAR | Status: DC | PRN
  Administered 2019-12-21 – 2019-12-23 (×4): 4 mg via INTRAVENOUS
  Filled 2019-12-19 (×4): qty 2

## 2019-12-19 MED ORDER — MAGNESIUM SULFATE 4 GM/100ML IV SOLN
4.0000 g | Freq: Once | INTRAVENOUS | Status: AC
  Administered 2019-12-19: 4 g via INTRAVENOUS
  Filled 2019-12-19: qty 100

## 2019-12-19 MED ORDER — ACETAMINOPHEN 325 MG PO TABS
650.0000 mg | ORAL_TABLET | Freq: Four times a day (QID) | ORAL | Status: DC | PRN
  Administered 2019-12-19: 650 mg via ORAL
  Filled 2019-12-19: qty 2

## 2019-12-19 MED ORDER — UMECLIDINIUM BROMIDE 62.5 MCG/INH IN AEPB
1.0000 | INHALATION_SPRAY | Freq: Every day | RESPIRATORY_TRACT | Status: DC
  Administered 2019-12-19 – 2019-12-23 (×5): 1 via RESPIRATORY_TRACT
  Filled 2019-12-19: qty 7

## 2019-12-19 MED ORDER — IOHEXOL 300 MG/ML  SOLN
100.0000 mL | Freq: Once | INTRAMUSCULAR | Status: AC | PRN
  Administered 2019-12-19: 100 mL via INTRAVENOUS

## 2019-12-19 MED ORDER — METHOCARBAMOL 1000 MG/10ML IJ SOLN
500.0000 mg | Freq: Four times a day (QID) | INTRAVENOUS | Status: DC | PRN
  Administered 2019-12-19 – 2019-12-21 (×5): 500 mg via INTRAVENOUS
  Filled 2019-12-19: qty 5
  Filled 2019-12-19: qty 500
  Filled 2019-12-19 (×6): qty 5

## 2019-12-19 MED ORDER — PIPERACILLIN-TAZOBACTAM 3.375 G IVPB
3.3750 g | Freq: Three times a day (TID) | INTRAVENOUS | Status: DC
  Administered 2019-12-19: 3.375 g via INTRAVENOUS
  Filled 2019-12-19: qty 50

## 2019-12-19 MED ORDER — SODIUM CHLORIDE 0.9% FLUSH
3.0000 mL | Freq: Two times a day (BID) | INTRAVENOUS | Status: DC
  Administered 2019-12-19: 3 mL via INTRAVENOUS

## 2019-12-19 MED ORDER — POTASSIUM CHLORIDE 10 MEQ/100ML IV SOLN
10.0000 meq | INTRAVENOUS | Status: AC
  Administered 2019-12-19 (×4): 10 meq via INTRAVENOUS
  Filled 2019-12-19 (×4): qty 100

## 2019-12-19 MED ORDER — CALCITRIOL 0.5 MCG PO CAPS
0.5000 ug | ORAL_CAPSULE | Freq: Every day | ORAL | Status: DC
  Administered 2019-12-19 – 2019-12-23 (×5): 0.5 ug via ORAL
  Filled 2019-12-19 (×5): qty 1

## 2019-12-19 MED ORDER — PIPERACILLIN-TAZOBACTAM 3.375 G IVPB 30 MIN
3.3750 g | Freq: Once | INTRAVENOUS | Status: AC
  Administered 2019-12-19: 3.375 g via INTRAVENOUS
  Filled 2019-12-19: qty 50

## 2019-12-19 MED ORDER — MORPHINE SULFATE (PF) 2 MG/ML IV SOLN
1.0000 mg | INTRAVENOUS | Status: DC | PRN
  Administered 2019-12-19 – 2019-12-20 (×2): 2 mg via INTRAVENOUS
  Administered 2019-12-21 (×2): 1 mg via INTRAVENOUS
  Administered 2019-12-22: 2 mg via INTRAVENOUS
  Administered 2019-12-23: 1 mg via INTRAVENOUS
  Filled 2019-12-19 (×6): qty 1

## 2019-12-19 MED ORDER — POTASSIUM CHLORIDE 10 MEQ/100ML IV SOLN: 10.0000 meq | INTRAVENOUS | Status: DC

## 2019-12-19 MED ORDER — PSYLLIUM 95 % PO PACK
1.0000 | PACK | Freq: Two times a day (BID) | ORAL | Status: DC
  Filled 2019-12-19: qty 1

## 2019-12-19 MED ORDER — SIMETHICONE 80 MG PO CHEW
80.0000 mg | CHEWABLE_TABLET | Freq: Four times a day (QID) | ORAL | Status: DC
  Administered 2019-12-19 – 2019-12-22 (×13): 80 mg via ORAL
  Filled 2019-12-19 (×19): qty 1

## 2019-12-19 MED ORDER — ACETAMINOPHEN 650 MG RE SUPP: 650.0000 mg | Freq: Four times a day (QID) | RECTAL | Status: DC | PRN

## 2019-12-19 MED ORDER — MEGESTROL ACETATE 40 MG PO TABS
40.0000 mg | ORAL_TABLET | Freq: Two times a day (BID) | ORAL | Status: DC
  Administered 2019-12-19 – 2019-12-23 (×9): 40 mg via ORAL
  Filled 2019-12-19 (×11): qty 1

## 2019-12-19 MED ORDER — LEVOTHYROXINE SODIUM 75 MCG PO TABS
150.0000 ug | ORAL_TABLET | Freq: Every day | ORAL | Status: DC
  Administered 2019-12-19 – 2019-12-23 (×5): 150 ug via ORAL
  Filled 2019-12-19 (×5): qty 2

## 2019-12-19 NOTE — Progress Notes (Signed)
Pharmacy Antibiotic Note  Rhonda Hale is a 74 y.o. female admitted on 12/18/2019 with colitis.  Pharmacy has been consulted for Zosyn dosing.  Plan: Zosyn 3.375g IV q8h (4-hour infusion).  Temp (24hrs), Avg:99.9 F (37.7 C), Min:99.9 F (37.7 C), Max:99.9 F (37.7 C)  Recent Labs  Lab 12/13/19 0748 12/14/19 1551 12/15/19 0435 12/16/19 0410 12/16/19 1655 12/17/19 0415 12/18/19 1900  WBC 12.6* 11.0* 11.2*  --   --  9.3 10.6*  CREATININE 0.73 0.72 0.77 0.98 0.83 0.79 0.92  LATICACIDVEN  --   --   --   --   --   --  1.0    Estimated Creatinine Clearance: 50.3 mL/min (by C-G formula based on SCr of 0.92 mg/dL).    Allergies  Allergen Reactions  . Atorvastatin Other (See Comments)    Severe heartburn, reflux     Thank you for allowing pharmacy to be a part of this patient's care.  Wynona Neat, PharmD, BCPS  12/19/2019 2:38 AM

## 2019-12-19 NOTE — ED Notes (Signed)
Pt eating snacks at this time.  No new needs noted.    CRISTA- DAUGHTER:   PLEASE CALL WHEN PATIENT IS TRANSFERRED TO ROOM AND FOR UPDATES.    [phone]

## 2019-12-19 NOTE — ED Notes (Signed)
Pt has intermittent confused statements, thinks there is a cat outside the room, but remains calm and pleasant.

## 2019-12-19 NOTE — ED Notes (Signed)
Daisy, 6N RN, aware pt is on Heparin drip.

## 2019-12-19 NOTE — ED Notes (Signed)
Diet was ordered for Lunch. 

## 2019-12-19 NOTE — ED Notes (Signed)
Admitting MD's in w/pt.  

## 2019-12-19 NOTE — Progress Notes (Signed)
Pt arrived to 6N03, VS as follows Temp 101.1, RR 20, BP 142/65, PR 94, o2sat 95 room air, no complaints of pain, oriented to room, call bell, call light, use of bed controls. Will endorse accordingly to night RN

## 2019-12-19 NOTE — Progress Notes (Signed)
[facility]  Referral received during prior hospitalization (dc on 1/6) for palliative services in the community once she is discharged.  [facility] will continue to follow to anticipate d/c date to set up appointment to begin services.  Thank you, Freddie Breech, RN [facility] Liaison [phone]

## 2019-12-19 NOTE — ED Notes (Signed)
Pure Wick was placed on patient. 

## 2019-12-19 NOTE — ED Notes (Signed)
Pt alert and oriented, talkative with nursing.  Repositioned and given fluids.  Denies any other needs at this time.

## 2019-12-19 NOTE — Progress Notes (Addendum)
ANTICOAGULATION CONSULT NOTE - Initial Consult  Pharmacy Consult for heparin Indication: hx of VTE  Patient Measurements: Heparin Dosing Weight: 66 kg  Vital Signs: Temp: 98.7 F (37.1 C) (01/07 1227) Temp Source: Oral (01/07 1227) BP: 146/51 (01/07 1227) Pulse Rate: 90 (01/07 1227)  Labs: Recent Labs    12/17/19 0415 12/18/19 1900 12/19/19 0500  HGB 8.2* 8.6* 7.9*  HCT 24.8* 25.4* 24.1*  PLT 530* 540* 550*  CREATININE 0.79 0.92 0.95    Estimated Creatinine Clearance: 48.7 mL/min (by C-G formula based on SCr of 0.95 mg/dL).   Medical History: Past Medical History:  Diagnosis Date  . Cancer ([place])   . COPD (chronic obstructive pulmonary disease) ([place])   . Hypertension   . PRES (posterior reversible encephalopathy syndrome)   . SBO (small bowel obstruction) ([place]) 11/2019     Assessment: 74 yo female admitted with fever and altered mental status. She had a recent SBO with resultant colostomy. She is on Eliquis for hx of PE. Her last dose was today around noon. Her hemoglobin is low at 7.9 today. She will be transitioned to heparin incase she needs a procedure. She is being admitted for cdiff colitis.  Goal of Therapy:  APTT 66-102 Heparin level 0.3-0.7 units/ml Monitor platelets by anticoagulation protocol: Yes   Plan:  -Heparin 1000 units/hr, begin tonight -Daily HL, CBC, aPTT -First level tomorrow morning   Harvel Quale 12/19/2019,2:36 PM

## 2019-12-19 NOTE — Progress Notes (Signed)
[facility] Progress Note     Subjective: CC-  Continues to complain of mostly right sided crampy abdominal pain. No appetite. Colostomy functioning.  C diff test came back positive.   Objective: Vital signs in last 24 hours: Temp:  [98.7 F (37.1 C)-99.9 F (37.7 C)] 98.7 F (37.1 C) (01/07 1227) Pulse Rate:  [78-97] 90 (01/07 1227) Resp:  [14-32] 20 (01/07 1227) BP: (106-146)/(46-82) 146/51 (01/07 1227) SpO2:  [93 %-100 %] 96 % (01/07 1227)    Intake/Output from previous day: No intake/output data recorded. Intake/Output this shift: Total I/O In: 100 [IV Piggyback:100] Out: -   PE: Gen: Alert, NAD, pleasant, uncomfortable Pulm:Rate andeffort normal Abd: Soft,mild distension, nontender, Eakins pouch over ostomy/ ostomy retracted/ red rubber catheter x2 in place/ liquid yellow fluid in pouch  Lab Results:  Recent Labs    12/18/19 1900 12/19/19 0500  WBC 10.6* 14.5*  HGB 8.6* 7.9*  HCT 25.4* 24.1*  PLT 540* 550*   BMET Recent Labs    12/18/19 1900 12/19/19 0500  NA 137 136  K 3.1* 3.4*  CL 108 104  CO2 20* 22  GLUCOSE 143* 94  BUN 5* <5*  CREATININE 0.92 0.95  CALCIUM 8.9 8.7*   PT/INR No results for input(s): LABPROT, INR in the last 72 hours. CMP     Component Value Date/Time   NA 136 12/19/2019 0500   NA 140 09/04/2019 1420   K 3.4 (L) 12/19/2019 0500   CL 104 12/19/2019 0500   CO2 22 12/19/2019 0500   GLUCOSE 94 12/19/2019 0500   BUN <5 (L) 12/19/2019 0500   BUN 12 09/04/2019 1420   CREATININE 0.95 12/19/2019 0500   CALCIUM 8.7 (L) 12/19/2019 0500   PROT 5.6 (L) 12/18/2019 1900   PROT 6.2 07/24/2019 1144   ALBUMIN 2.2 (L) 12/18/2019 1900   ALBUMIN 4.1 07/24/2019 1144   AST 17 12/18/2019 1900   ALT 15 12/18/2019 1900   ALKPHOS 64 12/18/2019 1900   BILITOT 0.7 12/18/2019 1900   BILITOT <0.2 07/24/2019 1144   GFRNONAA 59 (L) 12/19/2019 0500   GFRAA >60 12/19/2019 0500   Lipase     Component Value Date/Time    LIPASE 20 12/09/2019 1626       Studies/Results: CT ABDOMEN PELVIS W CONTRAST  Addendum Date: 12/19/2019   ADDENDUM REPORT: 12/19/2019 02:20 ADDENDUM: These results were called by telephone at the time of interpretation on 12/19/2019 at 2:20 am to provider Joni Reining , who verbally acknowledged these results. Electronically Signed   By: Lovena Le M.D.   On: 12/19/2019 02:20   Result Date: 12/19/2019 CLINICAL DATA:  Acute abdominal pain and fever, post diverting loop colostomy EXAM: CT ABDOMEN AND PELVIS WITH CONTRAST TECHNIQUE: Multidetector CT imaging of the abdomen and pelvis was performed using the standard protocol following bolus administration of intravenous contrast. CONTRAST:  180mL OMNIPAQUE IOHEXOL 300 MG/ML  SOLN COMPARISON:  CT abdomen pelvis 12/10/2019 FINDINGS: Lower chest: Small bilateral effusions, right slightly greater than left with adjacent passive atelectatic change and more diffuse atelectasis elsewhere within the lung bases. Normal heart size. No pericardial effusion. Hepatobiliary: Diffuse hepatic hypoattenuation compatible with hepatic steatosis. No focal liver abnormality is seen. Patient is post cholecystectomy. Slight prominence of the biliary tree likely related to reservoir effect. No calcified intraductal gallstones. Pancreas: Unremarkable. No pancreatic ductal dilatation or surrounding inflammatory changes. Spleen: Prior splenectomy. No soft tissue abnormality in the splenic fossa. Left subdiaphragmatic free fluid is noted, nonspecific in the postsurgical  state. Adrenals/Urinary Tract: Normal adrenal glands. No worrisome renal lesions. Coarse calcification in the lower pole right kidney likely nonobstructing calculus. No acute obstructive urolithiasis or hydronephrosis the portion of the distal right ureter is poorly visualized due to extensive streak artifact from patient's right hip prosthesis. Kidneys enhance and excrete symmetrically. Right lateral aspect of the  bladder is also partially obscured but with some questionable bladder wall thickening predominately anteriorly. Stomach/Bowel: , and duodenal sweep are unremarkable. No small bowel dilatation or wall thickening. There is diffuse pancolonic mural thickening. Enteric contrast media traverses to the level of a diverting loop colostomy of the transverse colon with passage of contrast into the patient's ostomy apparatus. Two linear radiodensities likely reflect a retained red rubber catheters referred to in the patient's operative report for assistance in formation of a colonic bridge. The distal colon is largely decompressed though does demonstrate diffuse mural thickening and edema. The appendix is mildly thickened and edematous as well measuring up to 8.2 cm with some focal periappendiceal stranding and fluid. Unclear if this is secondary to the more diffuse colonic process or a separate process itself. Vascular/Lymphatic: Atherosclerotic plaque within the normal caliber aorta. There is extensive retroperitoneal and mesenteric reactive adenopathy. Reproductive: Uterus is surgically absent. No concerning adnexal lesions. Right adnexal poorly evaluated due to streak artifact from patient's hip prosthesis. Other: There is a diverting loop colostomy in the anterior abdomen with placement of paired right refer catheters. Associated thickening of the anterior abdominal peritoneal surface is nonspecific given the recent C of the surgical procedure. Additional nonspecific free fluid is seen within the left and right subdiaphragmatic spaces as well as layering minimally within the pericolic gutters. Some focal stranding and thickening of the peritoneal surfaces noted in the right lower quadrant more pronounced than on the left. Musculoskeletal: Multilevel degenerative changes are present in the imaged portions of the spine. 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 Right hip hemiarthroplasty with resulting streak artifact obscuring portions of pelvis. IMPRESSION: 1. Patient is post formation of a diverting colostomy in the anterior abdomen with passage of ingested contrast media through the small bowel exiting into patient's ostomy apparatus. Adjacent soft tissue thickening and stranding is nonspecific given the recency of the operative procedure 12/11/2019. No organized abscess or collection is evident. 2. There is diffuse pancolonic edematous mural thickening which is worrisome for a superimposed colitis, either infectious or inflammatory in nature. 3. The appendix itself is mildly thickened and edematous with some focal periappendiceal stranding and fluid. Unclear if this is secondary to the more diffuse colonic process or a separate process itself. 4. There is a small amount of free fluid within the subdiaphragmatic spaces as well as layering minimally within the pericolic gutters. Additionally, there is focal stranding and thickening of the peritoneal surfaces in the right lower quadrant more pronounced than on the left. Findings are nonspecific in the postsurgical state. 5. Small bilateral effusions, right slightly greater than left with adjacent passive atelectatic change and more diffuse atelectasis elsewhere within the lung bases. 6. Hepatic steatosis. 7.  Aortic Atherosclerosis (ICD10-I70.0). 8. Right hemipelvis is partially obscured by streak artifact from patient's right hip hemi arthroplasty. 9. Additional incidental and ancillary findings as detailed above. Electronically Signed: By: Lovena Le M.D. On: 12/19/2019 01:49   DG Chest Port 1 View  Result Date: 12/18/2019 CLINICAL DATA:  Fever EXAM: PORTABLE CHEST 1 VIEW COMPARISON:  07/26/2019 FINDINGS: There is a well-positioned right-sided Port-A-Cath.  The heart size is stable from prior study. The appearance of the left lung base is stable from prior study. Postsurgical changes are noted of the left  lung apex. There is no pneumothorax. No large pleural effusion. No acute osseous abnormality. There are old healed left-sided rib fractures. IMPRESSION: No acute cardiopulmonary process. Electronically Signed   By: Constance Holster M.D.   On: 12/18/2019 19:22    Anti-infectives: Anti-infectives (From admission, onward)   Start     Dose/Rate Route Frequency Ordered Stop   12/19/19 1400  vancomycin (VANCOCIN) 50 mg/mL oral solution 125 mg     125 mg Oral 4 times daily 12/19/19 1353 12/29/19 1359   12/19/19 0800  piperacillin-tazobactam (ZOSYN) IVPB 3.375 g  Status:  Discontinued     3.375 g 12.5 mL/hr over 240 Minutes Intravenous Every 8 hours 12/19/19 0237 12/19/19 1354   12/19/19 0245  piperacillin-tazobactam (ZOSYN) IVPB 3.375 g     3.375 g 100 mL/hr over 30 Minutes Intravenous  Once 12/19/19 0237 12/19/19 0331       Assessment/Plan Recurrent ovarian cancer s/p TAH BSO in 2018 on chemotherapy H/o PE on eliquis COPD HTN  Colonic obstruction POD8 S/pdiverting loop colostomy creation12/30 Dr. Kieth Brightly --discharged 1/5 and readmitted 1/6-- C diff colitis - Patient is being admitted to the family medicine service. I have ordered oral vancomycin for c diff colitis. Ok for ice chips and sips of liquids today. Repeat labs in AM. WOC consult for assistance with ostomy appliance.  ID -zosyn x1, oral vancomycin 1/7>> FEN -IVF, sips/chips VTE -SCDs, please hold eliquis/ok for IV heparin Foley -none   LOS: 0 days    Wellington Hampshire, [facility] 12/19/2019, 1:49 PM Please see Amion for pager number during day hours 7:00am-4:30pm

## 2019-12-19 NOTE — Progress Notes (Signed)
Subjective:   [name] was doing well on rounds today. She reports no pain in her abdomen unless it is being palpated. She reports her output volume is unchanged. She is amenable to the plan of IV antibiotics for now.  Consults: surgery  Objective:  Vital signs in last 24 hours: Vitals:   12/19/19 0545 12/19/19 0600 12/19/19 0645 12/19/19 0738  BP: (!) 134/58 129/65  (!) 115/56  Pulse: 89 90 89 87  Resp: (!) 23 (!) _0 Temp:      TempSrc:      SpO2: 95% 95% 100% 94%   Physical Exam  Constitutional: No distress.  Cardiovascular: Normal rate, regular rhythm and intact distal pulses.  No murmur heard. Pulmonary/Chest: Effort normal. No respiratory distress.  Abdominal: Soft. Bowel sounds are normal. There is abdominal tenderness (to palpation).  Neurological: She is alert.  Skin: She is not diaphoretic. There is pallor.  Nursing note and vitals reviewed.  I/Os:  Intake/Output Summary (Last 24 hours) at 12/19/2019 0928 Last data filed at 12/19/2019 0745 Gross per 24 hour  Intake 100 ml  Output --  Net 100 ml   Labs: Results for orders placed or performed during the hospital encounter of 12/18/19 (from the past 24 hour(s))  Lactic acid, plasma     Status: None   Collection Time: 12/18/19  7:00 PM  Result Value Ref Range   Lactic Acid, Venous 1.0 0.5 - 1.9 mmol/L  CBC WITH DIFFERENTIAL     Status: Abnormal   Collection Time: 12/18/19  7:00 PM  Result Value Ref Range   WBC 10.6 (H) 4.0 - 10.5 K/uL   RBC 2.73 (L) 3.87 - 5.11 MIL/uL   Hemoglobin 8.6 (L) 12.0 - 15.0 g/dL   HCT 25.4 (L) 36.0 - 46.0 %   MCV 93.0 80.0 - 100.0 fL   MCH 31.5 26.0 - 34.0 pg   MCHC 33.9 30.0 - 36.0 g/dL   RDW 18.0 (H) 11.5 - 15.5 %   Platelets 540 (H) 150 - 400 K/uL   nRBC 0.2 0.0 - 0.2 %   Neutrophils Relative % 70 %   Neutro Abs 7.4 1.7 - 7.7 K/uL   Lymphocytes Relative 9 %   Lymphs Abs 1.0 0.7 - 4.0 K/uL   Monocytes Relative 17 %   Monocytes Absolute 1.8 (H) 0.1 - 1.0 K/uL    Eosinophils Relative 2 %   Eosinophils Absolute 0.2 0.0 - 0.5 K/uL   Basophils Relative 0 %   Basophils Absolute 0.0 0.0 - 0.1 K/uL   Immature Granulocytes 2 %   Abs Immature Granulocytes 0.19 (H) 0.00 - 0.07 K/uL  Comprehensive metabolic panel     Status: Abnormal   Collection Time: 12/18/19  7:00 PM  Result Value Ref Range   Sodium 137 135 - 145 mmol/L   Potassium 3.1 (L) 3.5 - 5.1 mmol/L   Chloride 108 98 - 111 mmol/L   CO2 20 (L) 22 - 32 mmol/L   Glucose, Bld 143 (H) 70 - 99 mg/dL   BUN 5 (L) 8 - 23 mg/dL   Creatinine, Ser 0.92 0.44 - 1.00 mg/dL   Calcium 8.9 8.9 - 10.3 mg/dL   Total Protein 5.6 (L) 6.5 - 8.1 g/dL   Albumin 2.2 (L) 3.5 - 5.0 g/dL   AST 17 15 - 41 U/L   ALT 15 0 - 44 U/L   Alkaline Phosphatase 64 38 - 126 U/L   Total Bilirubin 0.7 0.3 - 1.2  mg/dL   GFR calc non Af Amer >60 >60 mL/min   GFR calc Af Amer >60 >60 mL/min   Anion gap 9 5 - 15  Procalcitonin     Status: None   Collection Time: 12/18/19  7:00 PM  Result Value Ref Range   Procalcitonin 1.15 ng/mL  Lactate dehydrogenase     Status: None   Collection Time: 12/18/19  7:00 PM  Result Value Ref Range   LDH 169 98 - 192 U/L  Ferritin     Status: Abnormal   Collection Time: 12/18/19  7:00 PM  Result Value Ref Range   Ferritin 783 (H) 11 - 307 ng/mL  C-reactive protein     Status: Abnormal   Collection Time: 12/18/19  7:00 PM  Result Value Ref Range   CRP 14.7 (H) <1.0 mg/dL  Triglycerides     Status: None   Collection Time: 12/18/19  7:00 PM  Result Value Ref Range   Triglycerides 120 <150 mg/dL  POC SARS Coronavirus 2 Ag-ED - Nasal Swab (BD Veritor Kit)     Status: None   Collection Time: 12/18/19  7:37 PM  Result Value Ref Range   SARS Coronavirus 2 Ag NEGATIVE NEGATIVE  D-dimer, quantitative (not at [facility])     Status: Abnormal   Collection Time: 12/18/19  7:55 PM  Result Value Ref Range   D-Dimer, Quant 11.27 (H) 0.00 - 0.50 ug/mL-FEU  Fibrinogen     Status: Abnormal   Collection Time:  12/18/19  7:55 PM  Result Value Ref Range   Fibrinogen 798 (H) 210 - 475 mg/dL  SARS CORONAVIRUS 2 (TAT 6-24 HRS) Nasopharyngeal Nasopharyngeal Swab     Status: None   Collection Time: 12/18/19  9:00 PM   Specimen: Nasopharyngeal Swab  Result Value Ref Range   SARS Coronavirus 2 NEGATIVE NEGATIVE  Basic metabolic panel     Status: Abnormal   Collection Time: 12/19/19  5:00 AM  Result Value Ref Range   Sodium 136 135 - 145 mmol/L   Potassium 3.4 (L) 3.5 - 5.1 mmol/L   Chloride 104 98 - 111 mmol/L   CO2 22 22 - 32 mmol/L   Glucose, Bld 94 70 - 99 mg/dL   BUN <5 (L) 8 - 23 mg/dL   Creatinine, Ser 0.95 0.44 - 1.00 mg/dL   Calcium 8.7 (L) 8.9 - 10.3 mg/dL   GFR calc non Af Amer 59 (L) >60 mL/min   GFR calc Af Amer >60 >60 mL/min   Anion gap 10 5 - 15  CBC     Status: Abnormal   Collection Time: 12/19/19  5:00 AM  Result Value Ref Range   WBC 14.5 (H) 4.0 - 10.5 K/uL   RBC 2.56 (L) 3.87 - 5.11 MIL/uL   Hemoglobin 7.9 (L) 12.0 - 15.0 g/dL   HCT 24.1 (L) 36.0 - 46.0 %   MCV 94.1 80.0 - 100.0 fL   MCH 30.9 26.0 - 34.0 pg   MCHC 32.8 30.0 - 36.0 g/dL   RDW 18.1 (H) 11.5 - 15.5 %   Platelets 550 (H) 150 - 400 K/uL   nRBC 0.1 0.0 - 0.2 %  Magnesium     Status: Abnormal   Collection Time: 12/19/19  5:00 AM  Result Value Ref Range   Magnesium 1.2 (L) 1.7 - 2.4 mg/dL  Urinalysis, Routine w reflex microscopic     Status: Abnormal   Collection Time: 12/19/19  6:37 AM  Result Value Ref Range  Color, Urine YELLOW YELLOW   APPearance CLEAR CLEAR   Specific Gravity, Urine 1.039 (H) 1.005 - 1.030   pH 6.0 5.0 - 8.0   Glucose, UA NEGATIVE NEGATIVE mg/dL   Hgb urine dipstick SMALL (A) NEGATIVE   Bilirubin Urine NEGATIVE NEGATIVE   Ketones, ur NEGATIVE NEGATIVE mg/dL   Protein, ur NEGATIVE NEGATIVE mg/dL   Nitrite NEGATIVE NEGATIVE   Leukocytes,Ua NEGATIVE NEGATIVE   RBC / HPF 6-10 0 - 5 RBC/hpf   WBC, UA 0-5 0 - 5 WBC/hpf   Bacteria, UA NONE SEEN NONE SEEN   Squamous Epithelial / LPF  0-5 0 - 5   Mucus PRESENT   CBG monitoring, ED     Status: None   Collection Time: 12/19/19  8:20 AM  Result Value Ref Range   Glucose-Capillary 78 70 - 99 mg/dL   CT ABDOMEN PELVIS W CONTRAST  Addendum Date: 12/19/2019   ADDENDUM REPORT: 12/19/2019 02:20 ADDENDUM: These results were called by telephone at the time of interpretation on 12/19/2019 at 2:20 am to provider Joni Reining , who verbally acknowledged these results. Electronically Signed   By: Lovena Le M.D.   On: 12/19/2019 02:20   Result Date: 12/19/2019 CLINICAL DATA:  Acute abdominal pain and fever, post diverting loop colostomy EXAM: CT ABDOMEN AND PELVIS WITH CONTRAST TECHNIQUE: Multidetector CT imaging of the abdomen and pelvis was performed using the standard protocol following bolus administration of intravenous contrast. CONTRAST:  141m OMNIPAQUE IOHEXOL 300 MG/ML  SOLN COMPARISON:  CT abdomen pelvis 12/10/2019 FINDINGS: Lower chest: Small bilateral effusions, right slightly greater than left with adjacent passive atelectatic change and more diffuse atelectasis elsewhere within the lung bases. Normal heart size. No pericardial effusion. Hepatobiliary: Diffuse hepatic hypoattenuation compatible with hepatic steatosis. No focal liver abnormality is seen. Patient is post cholecystectomy. Slight prominence of the biliary tree likely related to reservoir effect. No calcified intraductal gallstones. Pancreas: Unremarkable. No pancreatic ductal dilatation or surrounding inflammatory changes. Spleen: Prior splenectomy. No soft tissue abnormality in the splenic fossa. Left subdiaphragmatic free fluid is noted, nonspecific in the postsurgical state. Adrenals/Urinary Tract: Normal adrenal glands. No worrisome renal lesions. Coarse calcification in the lower pole right kidney likely nonobstructing calculus. No acute obstructive urolithiasis or hydronephrosis the portion of the distal right ureter is poorly visualized due to extensive streak artifact  from patient's right hip prosthesis. Kidneys enhance and excrete symmetrically. Right lateral aspect of the bladder is also partially obscured but with some questionable bladder wall thickening predominately anteriorly. Stomach/Bowel: , and duodenal sweep are unremarkable. No small bowel dilatation or wall thickening. There is diffuse pancolonic mural thickening. Enteric contrast media traverses to the level of a diverting loop colostomy of the transverse colon with passage of contrast into the patient's ostomy apparatus. Two linear radiodensities likely reflect a retained red rubber catheters referred to in the patient's operative report for assistance in formation of a colonic bridge. The distal colon is largely decompressed though does demonstrate diffuse mural thickening and edema. The appendix is mildly thickened and edematous as well measuring up to 8.2 cm with some focal periappendiceal stranding and fluid. Unclear if this is secondary to the more diffuse colonic process or a separate process itself. Vascular/Lymphatic: Atherosclerotic plaque within the normal caliber aorta. There is extensive retroperitoneal and mesenteric reactive adenopathy. Reproductive: Uterus is surgically absent. No concerning adnexal lesions. Right adnexal poorly evaluated due to streak artifact from patient's hip prosthesis. Other: There is a diverting loop colostomy in the anterior abdomen with  placement of paired right refer catheters. Associated thickening of the anterior abdominal peritoneal surface is nonspecific given the recent C of the surgical procedure. Additional nonspecific free fluid is seen within the left and right subdiaphragmatic spaces as well as layering minimally within the pericolic gutters. Some focal stranding and thickening of the peritoneal surfaces noted in the right lower quadrant more pronounced than on the left. Musculoskeletal: Multilevel degenerative changes are present in the imaged portions of the  spine. 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 Right hip hemiarthroplasty with resulting streak artifact obscuring portions of pelvis. IMPRESSION: 1. Patient is post formation of a diverting colostomy in the anterior abdomen with passage of ingested contrast media through the small bowel exiting into patient's ostomy apparatus. Adjacent soft tissue thickening and stranding is nonspecific given the recency of the operative procedure 12/11/2019. No organized abscess or collection is evident. 2. There is diffuse pancolonic edematous mural thickening which is worrisome for a superimposed colitis, either infectious or inflammatory in nature. 3. The appendix itself is mildly thickened and edematous with some focal periappendiceal stranding and fluid. Unclear if this is secondary to the more diffuse colonic process or a separate process itself. 4. There is a small amount of free fluid within the subdiaphragmatic spaces as well as layering minimally within the pericolic gutters. Additionally, there is focal stranding and thickening of the peritoneal surfaces in the right lower quadrant more pronounced than on the left. Findings are nonspecific in the postsurgical state. 5. Small bilateral effusions, right slightly greater than left with adjacent passive atelectatic change and more diffuse atelectasis elsewhere within the lung bases. 6. Hepatic steatosis. 7.  Aortic Atherosclerosis (ICD10-I70.0). 8. Right hemipelvis is partially obscured by streak artifact from patient's right hip hemi arthroplasty. 9. Additional incidental and ancillary findings as detailed above. Electronically Signed: By: Lovena Le M.D. On: 12/19/2019 01:49   DG Chest Port 1 View  Result Date: 12/18/2019 CLINICAL DATA:  Fever EXAM: PORTABLE CHEST 1 VIEW COMPARISON:  07/26/2019 FINDINGS: There is a well-positioned right-sided Port-A-Cath. The heart size is stable from prior study.  The appearance of the left lung base is stable from prior study. Postsurgical changes are noted of the left lung apex. There is no pneumothorax. No large pleural effusion. No acute osseous abnormality. There are old healed left-sided rib fractures. IMPRESSION: No acute cardiopulmonary process. Electronically Signed   By: Constance Holster M.D.   On: 12/18/2019 19:22    Assessment/Plan:  Assessment: Rhonda Hale is a 74 yo F w/ a PMHx significant for COPD, HTN, hx of PE and metastatic ovarian cancer s/p TAH BSO in 2018 on chemotherapy with recent admission for large bowel obstruction now s/p surgery with ostomy creation here with fever and WBC found to have imaging findings concerning for pancolitis and possible appendicitis being treated with IV antibiotics while we pursue further workup.  Plan:  Active Problems:  Pancolitis/Possible Appendicitis: -pt with known metastatic ovarian cancer s/p TAH BSO in 2018 currently on chemotherapy here with large bowel obstruction with abrupt transition point in LUQ s/p surgery with ostomy creation -presenting shortly after discharge for increased abdominal pain, fever and AMS -febrile to 104 with EMS -CT abdomen showed pancolitis and possible appendicitis -surgery consulted and does not believe this to be appendicitis and recommends continued antibiotics, also ordered c diff  -patient started on zosyn -afebrile since admission -WBC 10.6 on admission up to 14.5 today  Plan: -continue zosyn -monitor fever  curve -daily CBC -fu surgery recs -fu blood cxs -fu cdiff  Electrolyte abnormalities: -low K and mag, 3.4 and 1.2 respectively -repleting both  Plan: -daily BMP -replete as needed  Hx PE: -continue home eliquis   Dispo: Anticipated discharge pending further clinical workup.  Al Decant, MD 12/19/2019, 9:28 AM Pager: [phone]

## 2019-12-19 NOTE — Progress Notes (Signed)
Subjective/Chief Complaint: 73yof discharged a couple days ago after undergoing diverting loop colostomy by [facility]K for LBO from recurrent ovarian cancer.  She had fever to 104 degress by EMS and 101 at home.  Having some abd pain when coughing but otherwise does not. Has large volume yellowish output from stoma right now.  She underwent ct scan and I was asked to see her for possible appendicitis.    Objective: Vital signs in last 24 hours: Temp:  [99.9 F (37.7 C)] 99.9 F (37.7 C) (01/06 2100) Pulse Rate:  [80-97] 93 (01/07 0221) Resp:  [14-28] 22 (01/07 0221) BP: (106-127)/(46-82) 127/82 (01/07 0220) SpO2:  [93 %-98 %] 98 % (01/07 0221)    Intake/Output from previous day: No intake/output data recorded. Intake/Output this shift: No intake/output data recorded.  General well appearing now in nad cv trr Lungs clear Abd stoma with appliance on and large volume yellowish fluid in bag, nontender, no rlq tenderness  Lab Results:  Recent Labs    12/17/19 0415 12/18/19 1900  WBC 9.3 10.6*  HGB 8.2* 8.6*  HCT 24.8* 25.4*  PLT 530* 540*   BMET Recent Labs    12/17/19 0415 12/18/19 1900  NA 134* 137  K 3.4* 3.1*  CL 104 108  CO2 20* 20*  GLUCOSE 120* 143*  BUN <5* 5*  CREATININE 0.79 0.92  CALCIUM 8.1* 8.9   PT/INR No results for input(s): LABPROT, INR in the last 72 hours. ABG No results for input(s): PHART, HCO3 in the last 72 hours.  Invalid input(s): PCO2, PO2  Studies/Results: CT ABDOMEN PELVIS W CONTRAST  Addendum Date: 12/19/2019   ADDENDUM REPORT: 12/19/2019 02:20 ADDENDUM: These results were called by telephone at the time of interpretation on 12/19/2019 at 2:20 am to provider Joni Reining , who verbally acknowledged these results. Electronically Signed   By: Lovena Le M.D.   On: 12/19/2019 02:20   Result Date: 12/19/2019 CLINICAL DATA:  Acute abdominal pain and fever, post diverting loop colostomy EXAM: CT ABDOMEN AND PELVIS WITH CONTRAST TECHNIQUE:  Multidetector CT imaging of the abdomen and pelvis was performed using the standard protocol following bolus administration of intravenous contrast. CONTRAST:  134mL OMNIPAQUE IOHEXOL 300 MG/ML  SOLN COMPARISON:  CT abdomen pelvis 12/10/2019 FINDINGS: Lower chest: Small bilateral effusions, right slightly greater than left with adjacent passive atelectatic change and more diffuse atelectasis elsewhere within the lung bases. Normal heart size. No pericardial effusion. Hepatobiliary: Diffuse hepatic hypoattenuation compatible with hepatic steatosis. No focal liver abnormality is seen. Patient is post cholecystectomy. Slight prominence of the biliary tree likely related to reservoir effect. No calcified intraductal gallstones. Pancreas: Unremarkable. No pancreatic ductal dilatation or surrounding inflammatory changes. Spleen: Prior splenectomy. No soft tissue abnormality in the splenic fossa. Left subdiaphragmatic free fluid is noted, nonspecific in the postsurgical state. Adrenals/Urinary Tract: Normal adrenal glands. No worrisome renal lesions. Coarse calcification in the lower pole right kidney likely nonobstructing calculus. No acute obstructive urolithiasis or hydronephrosis the portion of the distal right ureter is poorly visualized due to extensive streak artifact from patient's right hip prosthesis. Kidneys enhance and excrete symmetrically. Right lateral aspect of the bladder is also partially obscured but with some questionable bladder wall thickening predominately anteriorly. Stomach/Bowel: , and duodenal sweep are unremarkable. No small bowel dilatation or wall thickening. There is diffuse pancolonic mural thickening. Enteric contrast media traverses to the level of a diverting loop colostomy of the transverse colon with passage of contrast into the patient's ostomy apparatus. Two linear  radiodensities likely reflect a retained red rubber catheters referred to in the patient's operative report for  assistance in formation of a colonic bridge. The distal colon is largely decompressed though does demonstrate diffuse mural thickening and edema. The appendix is mildly thickened and edematous as well measuring up to 8.2 cm with some focal periappendiceal stranding and fluid. Unclear if this is secondary to the more diffuse colonic process or a separate process itself. Vascular/Lymphatic: Atherosclerotic plaque within the normal caliber aorta. There is extensive retroperitoneal and mesenteric reactive adenopathy. Reproductive: Uterus is surgically absent. No concerning adnexal lesions. Right adnexal poorly evaluated due to streak artifact from patient's hip prosthesis. Other: There is a diverting loop colostomy in the anterior abdomen with placement of paired right refer catheters. Associated thickening of the anterior abdominal peritoneal surface is nonspecific given the recent C of the surgical procedure. Additional nonspecific free fluid is seen within the left and right subdiaphragmatic spaces as well as layering minimally within the pericolic gutters. Some focal stranding and thickening of the peritoneal surfaces noted in the right lower quadrant more pronounced than on the left. Musculoskeletal: Multilevel degenerative changes are present in the imaged portions of the spine. 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 Right hip hemiarthroplasty with resulting streak artifact obscuring portions of pelvis. IMPRESSION: 1. Patient is post formation of a diverting colostomy in the anterior abdomen with passage of ingested contrast media through the small bowel exiting into patient's ostomy apparatus. Adjacent soft tissue thickening and stranding is nonspecific given the recency of the operative procedure 12/11/2019. No organized abscess or collection is evident. 2. There is diffuse pancolonic edematous mural thickening which is worrisome for a  superimposed colitis, either infectious or inflammatory in nature. 3. The appendix itself is mildly thickened and edematous with some focal periappendiceal stranding and fluid. Unclear if this is secondary to the more diffuse colonic process or a separate process itself. 4. There is a small amount of free fluid within the subdiaphragmatic spaces as well as layering minimally within the pericolic gutters. Additionally, there is focal stranding and thickening of the peritoneal surfaces in the right lower quadrant more pronounced than on the left. Findings are nonspecific in the postsurgical state. 5. Small bilateral effusions, right slightly greater than left with adjacent passive atelectatic change and more diffuse atelectasis elsewhere within the lung bases. 6. Hepatic steatosis. 7.  Aortic Atherosclerosis (ICD10-I70.0). 8. Right hemipelvis is partially obscured by streak artifact from patient's right hip hemi arthroplasty. 9. Additional incidental and ancillary findings as detailed above. Electronically Signed: By: Lovena Le M.D. On: 12/19/2019 01:49   DG Chest Port 1 View  Result Date: 12/18/2019 CLINICAL DATA:  Fever EXAM: PORTABLE CHEST 1 VIEW COMPARISON:  07/26/2019 FINDINGS: There is a well-positioned right-sided Port-A-Cath. The heart size is stable from prior study. The appearance of the left lung base is stable from prior study. Postsurgical changes are noted of the left lung apex. There is no pneumothorax. No large pleural effusion. No acute osseous abnormality. There are old healed left-sided rib fractures. IMPRESSION: No acute cardiopulmonary process. Electronically Signed   By: Constance Holster M.D.   On: 12/18/2019 19:22    Anti-infectives: Anti-infectives (From admission, onward)   Start     Dose/Rate Route Frequency Ordered Stop   12/19/19 0800  piperacillin-tazobactam (ZOSYN) IVPB 3.375 g     3.375 g 12.5 mL/hr over 240 Minutes Intravenous Every 8 hours 12/19/19 0237  12/19/19  0245  piperacillin-tazobactam (ZOSYN) IVPB 3.375 g     3.375 g 100 mL/hr over 30 Minutes Intravenous  Once 12/19/19 [id number]        Assessment/Plan: Colonic obstruction S/pdiverting loop colostomy creation12/30 Dr. Kieth Brightly -do not think she has appendicitis, remainder of ct with fluid etc normal -she does appear to have possible colitis given ostomy output and ct scan, recommend evaluation and abx -would check urine for infection also -no indication for any surgery and dont think she has appendicitis -will follow with you Recurrent ovarian cancer s/p TAH BSO in 2018 on chemotherapy H/o PE on eliquis COPD HTN    Rolm Bookbinder 12/19/2019

## 2019-12-19 NOTE — Consult Note (Signed)
[place] Nurse ostomy follow up Patient receiving care in [facility] ED 32.  She is well known to me from her recent admission and colostomy surgery. Consult completed remotely. Stoma type/location: Mid abdomen in the surgical incision Stomal assessment/size: deferred Peristomal assessment: deferred Treatment options for stomal/peristomal skin: barrier ring Output thin yellow per surgeon note Ostomy pouching: 1pc.SMALL EAKIN pouch Education provided: deferred Enrolled patient in [place] Start Discharge program: No but [place] information provided prior to her recent discharge.  She will need the Eakin pouch until the support rods have been removed, and perhaps even after that occurs.   Val Riles, RN, MSN, CWOCN, CNS-BC, pager [phone]

## 2019-12-19 NOTE — ED Notes (Signed)
Pt's daughter, Daleen Snook [id number] - called and RN updated.

## 2019-12-20 DIAGNOSIS — A0472 Enterocolitis due to Clostridium difficile, not specified as recurrent: Secondary | ICD-10-CM | POA: Diagnosis present

## 2019-12-20 LAB — BASIC METABOLIC PANEL
Anion gap: 13 (ref 5–15)
BUN: <5 mg/dL — ABNORMAL LOW (ref 8–23)
CO2: 21 mmol/L — ABNORMAL LOW (ref 22–32)
Calcium: 8.3 mg/dL — ABNORMAL LOW (ref 8.9–10.3)
Chloride: 102 mmol/L (ref 98–111)
Creatinine, Ser: 0.75 mg/dL (ref 0.44–1.00)
GFR calc Af Amer: >60 mL/min (ref >60)
GFR calc non Af Amer: >60 mL/min (ref >60)
Glucose, Bld: 81 mg/dL (ref 70–99)
Potassium: 2.9 mmol/L — ABNORMAL LOW (ref 3.5–5.1)
Sodium: 136 mmol/L (ref 135–145)

## 2019-12-20 LAB — CBC
HCT: 23.5 % — ABNORMAL LOW (ref 36.0–46.0)
Hemoglobin: 7.8 g/dL — ABNORMAL LOW (ref 12.0–15.0)
MCH: 30.8 pg (ref 26.0–34.0)
MCHC: 33.2 g/dL (ref 30.0–36.0)
MCV: 92.9 fL (ref 80.0–100.0)
Platelets: 532 10*3/uL — ABNORMAL HIGH (ref 150–400)
RBC: 2.53 MIL/uL — ABNORMAL LOW (ref 3.87–5.11)
RDW: 18.1 % — ABNORMAL HIGH (ref 11.5–15.5)
WBC: 14.5 10*3/uL — ABNORMAL HIGH (ref 4.0–10.5)
nRBC: 0 % (ref 0.0–0.2)

## 2019-12-20 LAB — APTT: aPTT: 90 seconds — ABNORMAL HIGH (ref 24–36)

## 2019-12-20 LAB — GLUCOSE, CAPILLARY: Glucose-Capillary: 74 mg/dL (ref 70–99)

## 2019-12-20 LAB — MAGNESIUM: Magnesium: 1.6 mg/dL — ABNORMAL LOW (ref 1.7–2.4)

## 2019-12-20 MED ORDER — VANCOMYCIN HCL 125 MG PO CAPS: 125.0000 mg | ORAL_CAPSULE | Freq: Four times a day (QID) | ORAL | 0 refills | Status: DC

## 2019-12-20 MED ORDER — POTASSIUM CHLORIDE 10 MEQ/100ML IV SOLN
10.0000 meq | INTRAVENOUS | Status: AC
  Administered 2019-12-20 (×6): 10 meq via INTRAVENOUS
  Filled 2019-12-20 (×6): qty 100

## 2019-12-20 MED ORDER — SODIUM CHLORIDE 0.9% FLUSH
10.0000 mL | INTRAVENOUS | Status: DC | PRN
  Administered 2019-12-23: 10 mL

## 2019-12-20 MED ORDER — MAGNESIUM SULFATE 4 GM/100ML IV SOLN
4.0000 g | Freq: Once | INTRAVENOUS | Status: AC
  Administered 2019-12-20: 4 g via INTRAVENOUS
  Filled 2019-12-20: qty 100

## 2019-12-20 MED ORDER — CHLORHEXIDINE GLUCONATE CLOTH 2 % EX PADS
6.0000 | MEDICATED_PAD | Freq: Every day | CUTANEOUS | Status: DC
  Administered 2019-12-20 – 2019-12-22 (×3): 6 via TOPICAL

## 2019-12-20 MED FILL — VANCOMYCIN HCL 125 MG CAP: 125 | 8 days supply | Qty: 32 | Fill #0

## 2019-12-20 NOTE — Progress Notes (Signed)
Patient accidentally pulld out her right chest port-a-cath when getting out of bed. Port-a-cath catheter and needle noted intact, site not bleeding.  IV Team notified.

## 2019-12-20 NOTE — Progress Notes (Addendum)
[facility] Progress Note     Subjective: CC-  Abdominal pain improved - now intermittent associated with peristalsis - crampy and short lived. Denies abdominal pain at this time. Denies n/v. Hungry.  C diff test came back positive.   Objective: Vital signs in last 24 hours: Temp:  [98.6 F (37 C)-101.1 F (38.4 C)] 98.6 F (37 C) (01/08 0452) Pulse Rate:  [90-96] 94 (01/08 0452) Resp:  [14-20] 18 (01/08 0452) BP: (115-146)/(51-66) 115/66 (01/08 0452) SpO2:  [94 %-100 %] 100 % (01/08 0452) Weight:  [74.8 kg] 74.8 kg (01/08 0500) Last BM Date: 12/19/19(thru ostomy)  Intake/Output from previous day: 01/07 0701 - 01/08 0700 In: 782.3 [P.O.:450; I.V.:114.5; IV Piggyback:217.8] Out: 555 [Urine:400; Stool:155] Intake/Output this shift: No intake/output data recorded.  PE: Gen: Alert, NAD, pleasant, uncomfortable Pulm:Rate andeffort normal Abd: Soft,nondistended, nontender, Eakins pouch over ostomy/ ostomy retracted/ red rubber catheter x2 in place/ liquid yellow fluid in pouch  Lab Results:  Recent Labs    12/19/19 0500 12/20/19 0426  WBC 14.5* 14.5*  HGB 7.9* 7.8*  HCT 24.1* 23.5*  PLT 550* 532*   BMET Recent Labs    12/19/19 0500 12/20/19 0426  NA 136 136  K 3.4* 2.9*  CL 104 102  CO2 22 21*  GLUCOSE 94 81  BUN <5* <5*  CREATININE 0.95 0.75  CALCIUM 8.7* 8.3*   PT/INR No results for input(s): LABPROT, INR in the last 72 hours. CMP     Component Value Date/Time   NA 136 12/20/2019 0426   NA 140 09/04/2019 1420   K 2.9 (L) 12/20/2019 0426   CL 102 12/20/2019 0426   CO2 21 (L) 12/20/2019 0426   GLUCOSE 81 12/20/2019 0426   BUN <5 (L) 12/20/2019 0426   BUN 12 09/04/2019 1420   CREATININE 0.75 12/20/2019 0426   CALCIUM 8.3 (L) 12/20/2019 0426   PROT 5.6 (L) 12/18/2019 1900   PROT 6.2 07/24/2019 1144   ALBUMIN 2.2 (L) 12/18/2019 1900   ALBUMIN 4.1 07/24/2019 1144   AST 17 12/18/2019 1900   ALT 15 12/18/2019 1900   ALKPHOS 64  12/18/2019 1900   BILITOT 0.7 12/18/2019 1900   BILITOT <0.2 07/24/2019 1144   GFRNONAA >60 12/20/2019 0426   GFRAA >60 12/20/2019 0426   Lipase     Component Value Date/Time   LIPASE 20 12/09/2019 1626       Studies/Results: CT ABDOMEN PELVIS W CONTRAST  Addendum Date: 12/19/2019   ADDENDUM REPORT: 12/19/2019 02:20 ADDENDUM: These results were called by telephone at the time of interpretation on 12/19/2019 at 2:20 am to provider Joni Reining , who verbally acknowledged these results. Electronically Signed   By: Lovena Le M.D.   On: 12/19/2019 02:20   Result Date: 12/19/2019 CLINICAL DATA:  Acute abdominal pain and fever, post diverting loop colostomy EXAM: CT ABDOMEN AND PELVIS WITH CONTRAST TECHNIQUE: Multidetector CT imaging of the abdomen and pelvis was performed using the standard protocol following bolus administration of intravenous contrast. CONTRAST:  163mL OMNIPAQUE IOHEXOL 300 MG/ML  SOLN COMPARISON:  CT abdomen pelvis 12/10/2019 FINDINGS: Lower chest: Small bilateral effusions, right slightly greater than left with adjacent passive atelectatic change and more diffuse atelectasis elsewhere within the lung bases. Normal heart size. No pericardial effusion. Hepatobiliary: Diffuse hepatic hypoattenuation compatible with hepatic steatosis. No focal liver abnormality is seen. Patient is post cholecystectomy. Slight prominence of the biliary tree likely related to reservoir effect. No calcified intraductal gallstones. Pancreas: Unremarkable. No pancreatic ductal dilatation  or surrounding inflammatory changes. Spleen: Prior splenectomy. No soft tissue abnormality in the splenic fossa. Left subdiaphragmatic free fluid is noted, nonspecific in the postsurgical state. Adrenals/Urinary Tract: Normal adrenal glands. No worrisome renal lesions. Coarse calcification in the lower pole right kidney likely nonobstructing calculus. No acute obstructive urolithiasis or hydronephrosis the portion of the  distal right ureter is poorly visualized due to extensive streak artifact from patient's right hip prosthesis. Kidneys enhance and excrete symmetrically. Right lateral aspect of the bladder is also partially obscured but with some questionable bladder wall thickening predominately anteriorly. Stomach/Bowel: , and duodenal sweep are unremarkable. No small bowel dilatation or wall thickening. There is diffuse pancolonic mural thickening. Enteric contrast media traverses to the level of a diverting loop colostomy of the transverse colon with passage of contrast into the patient's ostomy apparatus. Two linear radiodensities likely reflect a retained red rubber catheters referred to in the patient's operative report for assistance in formation of a colonic bridge. The distal colon is largely decompressed though does demonstrate diffuse mural thickening and edema. The appendix is mildly thickened and edematous as well measuring up to 8.2 cm with some focal periappendiceal stranding and fluid. Unclear if this is secondary to the more diffuse colonic process or a separate process itself. Vascular/Lymphatic: Atherosclerotic plaque within the normal caliber aorta. There is extensive retroperitoneal and mesenteric reactive adenopathy. Reproductive: Uterus is surgically absent. No concerning adnexal lesions. Right adnexal poorly evaluated due to streak artifact from patient's hip prosthesis. Other: There is a diverting loop colostomy in the anterior abdomen with placement of paired right refer catheters. Associated thickening of the anterior abdominal peritoneal surface is nonspecific given the recent C of the surgical procedure. Additional nonspecific free fluid is seen within the left and right subdiaphragmatic spaces as well as layering minimally within the pericolic gutters. Some focal stranding and thickening of the peritoneal surfaces noted in the right lower quadrant more pronounced than on the left. Musculoskeletal:  Multilevel degenerative changes are present in the imaged portions of the spine. 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 Right hip hemiarthroplasty with resulting streak artifact obscuring portions of pelvis. IMPRESSION: 1. Patient is post formation of a diverting colostomy in the anterior abdomen with passage of ingested contrast media through the small bowel exiting into patient's ostomy apparatus. Adjacent soft tissue thickening and stranding is nonspecific given the recency of the operative procedure 12/11/2019. No organized abscess or collection is evident. 2. There is diffuse pancolonic edematous mural thickening which is worrisome for a superimposed colitis, either infectious or inflammatory in nature. 3. The appendix itself is mildly thickened and edematous with some focal periappendiceal stranding and fluid. Unclear if this is secondary to the more diffuse colonic process or a separate process itself. 4. There is a small amount of free fluid within the subdiaphragmatic spaces as well as layering minimally within the pericolic gutters. Additionally, there is focal stranding and thickening of the peritoneal surfaces in the right lower quadrant more pronounced than on the left. Findings are nonspecific in the postsurgical state. 5. Small bilateral effusions, right slightly greater than left with adjacent passive atelectatic change and more diffuse atelectasis elsewhere within the lung bases. 6. Hepatic steatosis. 7.  Aortic Atherosclerosis (ICD10-I70.0). 8. Right hemipelvis is partially obscured by streak artifact from patient's right hip hemi arthroplasty. 9. Additional incidental and ancillary findings as detailed above. Electronically Signed: By: Lovena Le M.D. On: 12/19/2019 01:49   DG Chest [facility]  1 View  Result Date: 12/18/2019 CLINICAL DATA:  Fever EXAM: PORTABLE CHEST 1 VIEW COMPARISON:  07/26/2019 FINDINGS: There is a  well-positioned right-sided Port-A-Cath. The heart size is stable from prior study. The appearance of the left lung base is stable from prior study. Postsurgical changes are noted of the left lung apex. There is no pneumothorax. No large pleural effusion. No acute osseous abnormality. There are old healed left-sided rib fractures. IMPRESSION: No acute cardiopulmonary process. Electronically Signed   By: Constance Holster M.D.   On: 12/18/2019 19:22    Anti-infectives: Anti-infectives (From admission, onward)   Start     Dose/Rate Route Frequency Ordered Stop   12/19/19 1500  vancomycin (VANCOCIN) 50 mg/mL oral solution 125 mg     125 mg Oral 4 times daily 12/19/19 1353 12/29/19 1359   12/19/19 0800  piperacillin-tazobactam (ZOSYN) IVPB 3.375 g  Status:  Discontinued     3.375 g 12.5 mL/hr over 240 Minutes Intravenous Every 8 hours 12/19/19 0237 12/19/19 1354   12/19/19 0245  piperacillin-tazobactam (ZOSYN) IVPB 3.375 g     3.375 g 100 mL/hr over 30 Minutes Intravenous  Once 12/19/19 0237 12/19/19 0331       Assessment/Plan Recurrent ovarian cancer s/p TAH BSO in 2018 on chemotherapy H/o PE on eliquis COPD HTN  Colonic obstruction POD9 S/pdiverting loop colostomy creation12/30 Dr. Kieth Brightly --discharged 1/5 and readmitted 1/6-- C diff colitis - Patient has been admitted to the family medicine service. - On PO VAnc, continue - No evidence of "ileus" at this time. [place] for clear liquids today, advancing as tolerated.  - WOCN for assistance in stoma care and Eakin pouch  ID -zosyn x1, oral vancomycin 1/7>> FEN -IVF, clear liquids, advancing as tolerated VTE -SCDs, anticoagulation as per primary Foley -none   LOS: 1 day   Nadeen Landau, M.D. [facility] Use [email or web address] to contact on call provider

## 2019-12-20 NOTE — TOC Initial Note (Addendum)
Transition of Care [facility]) - Initial/Assessment Note    Patient Details  Name: Rhonda Hale MRN: [id number] Date of Birth: 1946/05/19  Transition of Care [facility]) CM/SW Contact:    Marilu Favre, RN Phone Number: 12/20/2019, 3:10 PM  Clinical Narrative:                 Spoke to patient and daughter Daleen Snook at bedside, recent discharge home with [facility], they would like to continue with [facility] . Cory with [facility] aware and will need new HHRN order for resumption of care. Dr Madilyn Fireman aware.  CMA priced 30 days of Vancomycin instead of 30 doses. A lot of pharmacies do not carry Vancomycin. Asked for prescription be since to Transitions of Care Pharmacy for cost and to fill today incase patient discharges over the weekend. Patient and daughter aware and in agreement.   Vancomycin PO script was sent to [place]. Nicole Kindred has spoken to the daughter. Daughter did not believe her mother would be discharging over the weekend so she did not pay for the Vancomycin. Paged Dr Madilyn Fireman   Expected Discharge Plan: [place] Barriers to Discharge: Continued Medical Work up   Patient Goals and CMS Choice Patient states their goals for this hospitalization and ongoing recovery are:: to go home CMS Medicare.gov Compare Post Acute Care list provided to:: Patient Choice offered to / list presented to : Patient, Adult Children  Expected Discharge Plan and Services Expected Discharge Plan: [place]   Discharge Planning Services: CM Consult Post Acute Care Choice: [place] arrangements for the past 2 months: [place]                 DME Arranged: N/A         HH Arranged: RN [place] Agency: [place] Date [place]: 12/20/19 Time [place] Agency Contacted: 1509 Representative spoke with at [place]: Tommi Rumps, I have requested order from [place] Arrangements/Services Living arrangements for the past 2 months: [place] with:: Adult Children Patient language and need for interpreter reviewed:: Yes Do you feel safe going back to the place where you live?: Yes      Need for Family Participation in Patient Care: Yes (Comment) Care giver support system in place?: Yes (comment) Current home services: Home RN Criminal Activity/Legal Involvement Pertinent to Current Situation/Hospitalization: No - Comment as needed  Activities of Daily Living Home Assistive Devices/Equipment: Eyeglasses ADL Screening (condition at time of admission) Patient's cognitive ability adequate to safely complete daily activities?: Yes Is the patient deaf or have difficulty hearing?: No Does the patient have difficulty seeing, even when wearing glasses/contacts?: No Does the patient have difficulty concentrating, remembering, or making decisions?: No Patient able to express need for assistance with ADLs?: Yes Does the patient have difficulty dressing or bathing?: No Independently performs ADLs?: Yes (appropriate for developmental age) Does the patient have difficulty walking or climbing stairs?: Yes Weakness of Legs: Both Weakness of Arms/Hands: None  Permission Sought/Granted   Permission granted to share information with : Yes, Verbal Permission Granted  Share Information with NAME: Daleen Snook daughter  Permission granted to share info w AGENCY: [facility]        Emotional Assessment Appearance:: Appears stated age Attitude/Demeanor/Rapport: Engaged Affect (typically observed): Accepting Orientation: : Oriented to Self, Oriented to Place, Oriented to  Time, Oriented to Situation Alcohol / Substance Use: Not Applicable Psych Involvement: No (comment)  Admission diagnosis:  Fever [R50.9] Pancolitis ([place]) [K51.00] Patient Active Problem List   Diagnosis Date Noted  . Enteritis due to Clostridium difficile 12/20/2019  . Abdominal pain 12/19/2019  . Pancolitis ([place]) 12/19/2019  . Palliative care encounter   .  Colonic obstruction ([place]) 12/10/2019  . Adrenal insufficiency ([place]) 09/05/2019  . Hypotension 08/21/2019  . Toxic metabolic encephalopathy 0000000  . Hyponatremia 07/31/2019  . AMS (altered mental status) 07/27/2019  . Dehydration 07/26/2019  . PRES (posterior reversible encephalopathy syndrome) 07/07/2019  . Fever 07/03/2019  . AKI (acute kidney injury) ([place]) 07/03/2019  . Hypercalcemia 07/03/2019  . Hypomagnesemia, chronic 07/03/2019  . Anticoagulated 04/15/2019  . Ovary cancer ([place]) 07/03/2017  . Postoperative hypothyroidism 01/01/2013   PCP:  Marianna Payment, MD Pharmacy:   [place], [place] - [address] [facility]. [address] [place] [address] Phone: [phone] Fax: [place], [place] [address] [place] [address] Phone: [phone] Fax: [phone]     Social Determinants of Health (SDOH) Interventions    Readmission Risk Interventions Readmission Risk Prevention Plan 12/13/2019 12/10/2019 07/05/2019  Post Dischage Appt - - Complete  Medication Screening - - Complete  Transportation Screening Complete Complete Complete  PCP or Specialist Appt within 3-5 Days Complete Not Complete -  Not Complete comments - pending medical progression -  HRI or Home Care Consult Complete Complete -  Social Work Consult for [place] Planning/Counseling Complete Complete -  Palliative Care Screening Complete Not Complete -  Medication Review (RN Care Manager) Referral to Pharmacy Complete -  Some recent data might be hidden

## 2019-12-20 NOTE — Consult Note (Signed)
Rhonda Hale Nurse ostomy follow up Patient receiving care in [facility] [phone]. Eakin pouch I placed on 12/16/19 is intact, no wash out observed for barrier, no signs of impending leakage.  Supplies obtained and placed in room.  I cut the opening of a new small Eakin pouch in the event staff need to change the pouch over the weekend.  I plan to follow up with her if she is still in hospital next week. Val Riles, RN, MSN, CWOCN, CNS-BC, pager [phone]

## 2019-12-20 NOTE — Discharge Summary (Signed)
Name: Rhonda Hale MRN: [id number] DOB: 1946-02-23 74 y.o. PCP: Marianna Payment, MD  Date of Admission: 12/18/2019  6:21 PM Date of Discharge:  12/23/19 Attending Physician: Lucious Groves, DO  Discharge Diagnosis:  1. C Diff Colitis 2. Hypomagnesia and Hypokalemia  Discharge Medications: Allergies as of 12/23/2019      Reactions   Atorvastatin Other (See Comments)   Severe heartburn, reflux      Medication List    TAKE these medications   acetaminophen 325 MG tablet Commonly known as: TYLENOL Take 2 tablets (650 mg total) by mouth every 6 (six) hours as needed for mild pain (or Fever >/= 101).   amLODipine 5 MG tablet Commonly known as: NORVASC Take 1 tablet (5 mg total) by mouth daily.   apixaban 5 MG Tabs tablet Commonly known as: Eliquis Take 1 tablet (5 mg total) by mouth 2 (two) times daily.   calcitRIOL 0.5 MCG capsule Commonly known as: ROCALTROL Take 1 capsule (0.5 mcg total) by mouth daily.   Calcium-Vitamin D 600-400 MG-UNIT Tabs Take 1 tablet by mouth 2 (two) times a day.   escitalopram 20 MG tablet Commonly known as: LEXAPRO Take 1 tablet (20 mg total) by mouth daily.   Incruse Ellipta 62.5 MCG/INH Aepb Generic drug: umeclidinium bromide Inhale 1 puff into the lungs daily.   labetalol 100 MG tablet Commonly known as: NORMODYNE Take 0.5 tablets (50 mg total) by mouth 2 (two) times daily.   levothyroxine 150 MCG tablet Commonly known as: SYNTHROID Take 1 tablet (150 mcg total) by mouth daily before breakfast.   megestrol 20 MG tablet Commonly known as: MEGACE Take 2 tablets (40 mg total) by mouth 2 (two) times daily.   methocarbamol 750 MG tablet Commonly known as: ROBAXIN Take 1 tablet (750 mg total) by mouth every 6 (six) hours as needed for muscle spasms.   multivitamin with minerals Tabs tablet Take 1 tablet by mouth daily.   oxyCODONE 5 MG immediate release tablet Commonly known as: Oxy IR/ROXICODONE Take 1 tablet (5 mg total) by  mouth every 6 (six) hours as needed for moderate pain or severe pain.   polycarbophil 625 MG tablet Commonly known as: FIBERCON Take 1 tablet (625 mg total) by mouth 2 (two) times daily.   QUEtiapine 25 MG tablet Commonly known as: SEROQUEL Take 1 tablet (25 mg total) by mouth at bedtime. What changed: how much to take   simethicone 80 MG chewable tablet Commonly known as: MYLICON Chew 1 tablet (80 mg total) by mouth 4 (four) times daily.   vancomycin 125 MG capsule Commonly known as: VANCOCIN Take 1 capsule (125 mg total) by mouth 4 (four) times daily.       Disposition and follow-up:   [name] was discharged from [facility] in Stable condition.  At the hospital follow up visit please address:   1.  Adequate pain control. Adherence to antibiotic regimen. Electrolyte abnormalities. Palliative care follow up.  2.  Labs / imaging needed at time of follow-up: BMP and mag  3.  Pending labs/ test needing follow-up: na  Follow-up Appointments: Follow-up Information    Marianna Payment, MD Follow up in 1 week(s).   Specialty: Internal Medicine Contact information: [address] [address] [phone]        [facility] Follow up in 2 day(s).   Why: Please schedule a follow up in 1-2 days Contact information: [place]  [address] [place] [phone]          Hospital Course by problem list:  1. C Diff Colitis  -pt with known metastatic ovarian cancer s/p TAH BSO in 2018 currently on chemotherapy here with large bowel obstruction with abrupt transition point in LUQ s/p surgery with ostomy creation  -presenting shortly after discharge for increased abdominal pain, fever and AMS -CT abdomen showed pancolitis and possible appendicitis -pt positive for C Diff toxin; of note she was hospitalized in mid December for UTI and treated with cipro and likely received  perioperative antibiotics during her last admission -patient on oral vanc  2. Hypomagnesia and Hypokalemia -low K and mag, likely secondary to diarrhea -monitored daily and repleted as needed -kdur at discharge  Discharge Vitals:   BP 131/75 (BP Location: Right Arm)   Pulse 93   Temp 99.1 F (37.3 C) (Oral)   Resp 19   Wt 73.4 kg   SpO2 98%   BMI 29.59 kg/m   Pertinent Labs, Studies, and Procedures:  CBC Latest Ref Rng & Units 12/23/2019 12/22/2019 12/21/2019  WBC 4.0 - 10.5 K/uL 14.4(H) 16.6(H) 16.4(H)  Hemoglobin 12.0 - 15.0 g/dL 7.7(L) 8.5(L) 7.8(L)  Hematocrit 36.0 - 46.0 % 23.5(L) 25.1(L) 23.3(L)  Platelets 150 - 400 K/uL 531(H) 611(H) 570(H)   BMP Latest Ref Rng & Units 12/23/2019 12/22/2019 12/21/2019  Glucose 70 - 99 mg/dL 79 96 86  BUN 8 - 23 mg/dL <5(L) <5(L) <5(L)  Creatinine 0.44 - 1.00 mg/dL 0.77 0.85 0.76  BUN/Creat Ratio 12 - 28 - - -  Sodium 135 - 145 mmol/L 140 136 134(L)  Potassium 3.5 - 5.1 mmol/L 2.9(L) 3.4(L) 3.1(L)  Chloride 98 - 111 mmol/L 109 101 102  CO2 22 - 32 mmol/L 18(L) 19(L) 18(L)  Calcium 8.9 - 10.3 mg/dL 8.4(L) 8.8(L) 8.6(L)   DG Abd 1 View  Result Date: 12/22/2019 CLINICAL DATA:  Abdominal pain, post diverting colostomy EXAM: ABDOMEN - 1 VIEW COMPARISON:  12/10/2019 abdominal radiograph and 12/19/2019 CT abdomen/pelvis FINDINGS: Surgical drains overlie the midline abdomen, unchanged. Mildly dilated small bowel loops in the left and central abdomen, mildly increased. No evidence of pneumatosis or pneumoperitoneum. Cholecystectomy clips are seen in the right upper quadrant of the abdomen. No radiopaque nephrolithiasis. Clear lung bases. Partially visualized right hip arthroplasty. IMPRESSION: Mildly dilated small bowel loops in the left and central abdomen, mildly increased, compatible with mild postoperative adynamic ileus. Electronically Signed   By: Ilona Sorrel M.D.   On: 12/22/2019 14:34   CT ABDOMEN PELVIS W CONTRAST  Addendum Date: 12/19/2019     ADDENDUM REPORT: 12/19/2019 02:20 ADDENDUM: These results were called by telephone at the time of interpretation on 12/19/2019 at 2:20 am to provider Joni Reining , who verbally acknowledged these results. Electronically Signed   By: Lovena Le M.D.   On: 12/19/2019 02:20   Result Date: 12/19/2019 CLINICAL DATA:  Acute abdominal pain and fever, post diverting loop colostomy EXAM: CT ABDOMEN AND PELVIS WITH CONTRAST TECHNIQUE: Multidetector CT imaging of the abdomen and pelvis was performed using the standard protocol following bolus administration of intravenous contrast. CONTRAST:  1101mL OMNIPAQUE IOHEXOL 300 MG/ML  SOLN COMPARISON:  CT abdomen pelvis 12/10/2019 FINDINGS: Lower chest: Small bilateral effusions, right slightly greater than left with adjacent passive atelectatic change and more diffuse atelectasis elsewhere within the lung bases. Normal heart size. No pericardial effusion. Hepatobiliary: Diffuse hepatic hypoattenuation compatible with hepatic steatosis. No focal liver abnormality is seen. Patient is post  cholecystectomy. Slight prominence of the biliary tree likely related to reservoir effect. No calcified intraductal gallstones. Pancreas: Unremarkable. No pancreatic ductal dilatation or surrounding inflammatory changes. Spleen: Prior splenectomy. No soft tissue abnormality in the splenic fossa. Left subdiaphragmatic free fluid is noted, nonspecific in the postsurgical state. Adrenals/Urinary Tract: Normal adrenal glands. No worrisome renal lesions. Coarse calcification in the lower pole right kidney likely nonobstructing calculus. No acute obstructive urolithiasis or hydronephrosis the portion of the distal right ureter is poorly visualized due to extensive streak artifact from patient's right hip prosthesis. Kidneys enhance and excrete symmetrically. Right lateral aspect of the bladder is also partially obscured but with some questionable bladder wall thickening predominately anteriorly.  Stomach/Bowel: , and duodenal sweep are unremarkable. No small bowel dilatation or wall thickening. There is diffuse pancolonic mural thickening. Enteric contrast media traverses to the level of a diverting loop colostomy of the transverse colon with passage of contrast into the patient's ostomy apparatus. Two linear radiodensities likely reflect a retained red rubber catheters referred to in the patient's operative report for assistance in formation of a colonic bridge. The distal colon is largely decompressed though does demonstrate diffuse mural thickening and edema. The appendix is mildly thickened and edematous as well measuring up to 8.2 cm with some focal periappendiceal stranding and fluid. Unclear if this is secondary to the more diffuse colonic process or a separate process itself. Vascular/Lymphatic: Atherosclerotic plaque within the normal caliber aorta. There is extensive retroperitoneal and mesenteric reactive adenopathy. Reproductive: Uterus is surgically absent. No concerning adnexal lesions. Right adnexal poorly evaluated due to streak artifact from patient's hip prosthesis. Other: There is a diverting loop colostomy in the anterior abdomen with placement of paired right refer catheters. Associated thickening of the anterior abdominal peritoneal surface is nonspecific given the recent C of the surgical procedure. Additional nonspecific free fluid is seen within the left and right subdiaphragmatic spaces as well as layering minimally within the pericolic gutters. Some focal stranding and thickening of the peritoneal surfaces noted in the right lower quadrant more pronounced than on the left. Musculoskeletal: Multilevel degenerative changes are present in the imaged portions of the spine. 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 Right hip hemiarthroplasty with resulting streak artifact obscuring portions of pelvis. IMPRESSION: 1.  Patient is post formation of a diverting colostomy in the anterior abdomen with passage of ingested contrast media through the small bowel exiting into patient's ostomy apparatus. Adjacent soft tissue thickening and stranding is nonspecific given the recency of the operative procedure 12/11/2019. No organized abscess or collection is evident. 2. There is diffuse pancolonic edematous mural thickening which is worrisome for a superimposed colitis, either infectious or inflammatory in nature. 3. The appendix itself is mildly thickened and edematous with some focal periappendiceal stranding and fluid. Unclear if this is secondary to the more diffuse colonic process or a separate process itself. 4. There is a small amount of free fluid within the subdiaphragmatic spaces as well as layering minimally within the pericolic gutters. Additionally, there is focal stranding and thickening of the peritoneal surfaces in the right lower quadrant more pronounced than on the left. Findings are nonspecific in the postsurgical state. 5. Small bilateral effusions, right slightly greater than left with adjacent passive atelectatic change and more diffuse atelectasis elsewhere within the lung bases. 6. Hepatic steatosis. 7.  Aortic Atherosclerosis (ICD10-I70.0). 8. Right hemipelvis is partially obscured by streak artifact from patient's right hip hemi arthroplasty. 9. Additional  incidental and ancillary findings as detailed above. Electronically Signed: By: Lovena Le M.D. On: 12/19/2019 01:49   CT ABDOMEN PELVIS W CONTRAST  Result Date: 12/10/2019 CLINICAL DATA:  Nausea and vomiting. Lower abdominal pain and constipation. Bowel obstruction suspected. History of ovarian cancer, active chemotherapy. EXAM: CT ABDOMEN AND PELVIS WITH CONTRAST TECHNIQUE: Multidetector CT imaging of the abdomen and pelvis was performed using the standard protocol following bolus administration of intravenous contrast. CONTRAST:  115mL OMNIPAQUE IOHEXOL  300 MG/ML  SOLN COMPARISON:  Abdominal CT 07/05/2019 FINDINGS: Lower chest: Heterogeneous basilar pulmonary parenchyma, diminished heterogeneity from prior exam. Hepatobiliary: No focal hepatic lesion. Clips in the gallbladder fossa postcholecystectomy. No biliary dilatation. Pancreas: No ductal dilatation or inflammation. Spleen: Splenectomy. Adrenals/Urinary Tract: No adrenal nodule. No hydronephrosis or perinephric edema. Coarse calcification in the lower right kidney favored to be intraparenchymal rather than nonobstructing stone. Slight lobular bilateral renal contours. Urinary bladder is partially distended, grossly normal but partially obscured by streak artifact from right hip arthroplasty. Stomach/Bowel: Cecum, ascending, and transverse colon are dilated significantly. Cecum measures up to 9.3 cm. No pneumatosis. There is abrupt transition point from dilated to nondilated bowel in the left upper quadrant the region of the splenic flexure, series 3, image 18. descending and sigmoid colon are decompressed. No definite mesenteric twist or mesenteric swirling. Distal ileum is slightly patulous, no significant small bowel dilatation. Umbilical hernia contains small bowel without inflammation or small bowel obstruction. Fluid/ingested material in the stomach, questionable gastric wall thickening anteriorly. Vascular/Lymphatic: Aortic atherosclerosis without aneurysm. Retroperitoneal adenopathy which is new from prior exam, including 12 mm left periaortic node, series 3, image 33. 14 mm retrocaval node series 3, image 28. Multiple additional prominent retroperitoneal and central mesenteric lymph nodes. Few prominent portal caval nodes are subcentimeter. Reproductive: Reported hysterectomy, however pelvic structures are obscured by streak artifact from right hip arthroplasty. No visualized adnexal mass. Other: Small volume of free fluid/ascites in the left greater than right upper quadrant. Minimal free fluid in  the right lower quadrant. Mild central mesenteric edema. Ill-defined soft tissue density in the central upper abdomen, series 3, image 19, difficult to delineate decompressed bowel from adenopathy/omental nodularity. Small fat containing right upper ventral abdominal wall hernia. Periumbilical hernia contains nonobstructed or inflamed small bowel. No free air. Musculoskeletal: Right hip arthroplasty. No acute osseous abnormality or focal bone lesion. Degenerative change in the spine. IMPRESSION: 1. Findings consistent with colonic obstruction with abrupt transition point in the left upper quadrant at the region of the splenic flexure. Findings may be due to adhesions given prior splenectomy, however this is in the region of ill-defined soft tissue density which is poorly defined. It is unclear if this soft tissue density represents decompressed adjacent bowel loops or possibly omental disease given history of ovarian cancer. 2. New retroperitoneal and mesenteric adenopathy, suspicious for metastatic disease in the setting of ovarian cancer. 3. Small amount of free fluid/ascites in the upper quadrants and right lower quadrant. 4. Umbilical hernia contains loop of small bowel, however no associated small bowel obstruction or inflammation. Aortic Atherosclerosis (ICD10-I70.0). Electronically Signed   By: Keith Rake M.D.   On: 12/10/2019 04:54   DG Chest Port 1 View  Result Date: 12/18/2019 CLINICAL DATA:  Fever EXAM: PORTABLE CHEST 1 VIEW COMPARISON:  07/26/2019 FINDINGS: There is a well-positioned right-sided Port-A-Cath. The heart size is stable from prior study. The appearance of the left lung base is stable from prior study. Postsurgical changes are noted of the left lung apex.  There is no pneumothorax. No large pleural effusion. No acute osseous abnormality. There are old healed left-sided rib fractures. IMPRESSION: No acute cardiopulmonary process. Electronically Signed   By: Constance Holster M.D.    On: 12/18/2019 19:22   DG Abd Portable 1V  Result Date: 12/10/2019 CLINICAL DATA:  NG tube placement. EXAM: PORTABLE ABDOMEN - 1 VIEW COMPARISON:  CT earlier this day. FINDINGS: Tip and side port of the enteric tube in the left upper quadrant, likely in the stomach below the diaphragm, however diaphragm not well visualized given field of view. Gaseous distension of transverse unchanged, distended stool-filled proximal colon, better seen on CT. Excreted IV contrast in both renal collecting systems and urinary bladder. IMPRESSION: Tip and side port of the enteric tube below the diaphragm in the stomach. Electronically Signed   By: Keith Rake M.D.   On: 12/10/2019 06:44   Discharge Instructions:   Signed: Al Decant, MD 12/23/2019, 10:08 AM   Pager: [phone]

## 2019-12-20 NOTE — Progress Notes (Addendum)
ANTICOAGULATION CONSULT NOTE - Follow-Up Consult  Pharmacy Consult for heparin Indication: hx of VTE  Patient Measurements: Heparin Dosing Weight: 66 kg  Vital Signs: Temp: 98.6 F (37 C) (01/08 0452) Temp Source: Oral (01/08 0452) BP: 115/66 (01/08 0452) Pulse Rate: 94 (01/08 0452)  Labs: Recent Labs    12/18/19 1900 12/19/19 0500 12/20/19 0426  HGB 8.6* 7.9* 7.8*  HCT 25.4* 24.1* 23.5*  PLT 540* 550* 532*  APTT  --   --  90*  CREATININE 0.92 0.95  --     Estimated Creatinine Clearance: 48.7 mL/min (by C-G formula based on SCr of 0.95 mg/dL).   Medical History: Past Medical History:  Diagnosis Date  . Cancer ([place])   . COPD (chronic obstructive pulmonary disease) ([place])   . Hypertension   . PRES (posterior reversible encephalopathy syndrome)   . SBO (small bowel obstruction) ([place]) 11/2019     Assessment: 74 yo female admitted with fever and altered mental status. She had a recent SBO with resultant colostomy. She is on Eliquis for hx of PE. Her last dose was 1/7 around noon. Her hemoglobin is low at 7.9 today. She will be transitioned to heparin incase she needs a procedure. She is being admitted for cdiff colitis.  APTT is 90 (therapeutic) on heparin at 1000 units/hr.  Will continue to use aPTTs for heparin monitoring until Apixaban has cleared.  Goal of Therapy:  APTT 66-102 Heparin level 0.3-0.7 units/ml Monitor platelets by anticoagulation protocol: Yes   Plan:  -Continue Heparin at 1000 units/hr -Follow up AM labs  -Resume Apixaban?  Thank you Anette Guarneri, PharmD  **Pharmacist phone directory can be found on [email or web address] listed under [place].

## 2019-12-20 NOTE — Progress Notes (Addendum)
Subjective:   Rhonda Hale was resting comfortably in bed this morning on rounds. She reports some intermittent abdominal pain. We discussed the plan and she is amenable. She has no other concerns at this time.  Consults: surgery  Objective:  Vital signs in last 24 hours: Vitals:   12/20/19 0452 12/20/19 0500 12/20/19 0823 12/20/19 1100  BP: 115/66     Pulse: 94     Resp: 18     Temp: 98.6 F (37 C)     TempSrc: Oral     SpO2: 100%  95%   Weight:  74.8 kg  75.1 kg   Physical Exam  Constitutional: No distress.  Cardiovascular: Normal rate, regular rhythm and intact distal pulses.  No murmur heard. Pulmonary/Chest: Effort normal. No respiratory distress.  Abdominal: Soft. Bowel sounds are normal. There is abdominal tenderness (to palpation).  Neurological: She is alert.  Skin: She is not diaphoretic. There is pallor.  Nursing note and vitals reviewed.  I/Os:  Intake/Output Summary (Last 24 hours) at 12/20/2019 1134 Last data filed at 12/20/2019 1012 Gross per 24 hour  Intake 885.39 ml  Output 855 ml  Net 30.39 ml   Labs: Results for orders placed or performed during the hospital encounter of 12/18/19 (from the past 24 hour(s))  CBC     Status: Abnormal   Collection Time: 12/20/19  4:26 AM  Result Value Ref Range   WBC 14.5 (H) 4.0 - 10.5 K/uL   RBC 2.53 (L) 3.87 - 5.11 MIL/uL   Hemoglobin 7.8 (L) 12.0 - 15.0 g/dL   HCT 23.5 (L) 36.0 - 46.0 %   MCV 92.9 80.0 - 100.0 fL   MCH 30.8 26.0 - 34.0 pg   MCHC 33.2 30.0 - 36.0 g/dL   RDW 18.1 (H) 11.5 - 15.5 %   Platelets 532 (H) 150 - 400 K/uL   nRBC 0.0 0.0 - 0.2 %  Basic metabolic panel     Status: Abnormal   Collection Time: 12/20/19  4:26 AM  Result Value Ref Range   Sodium 136 135 - 145 mmol/L   Potassium 2.9 (L) 3.5 - 5.1 mmol/L   Chloride 102 98 - 111 mmol/L   CO2 21 (L) 22 - 32 mmol/L   Glucose, Bld 81 70 - 99 mg/dL   BUN <5 (L) 8 - 23 mg/dL   Creatinine, Ser 0.75 0.44 - 1.00 mg/dL   Calcium 8.3 (L) 8.9 -  10.3 mg/dL   GFR calc non Af Amer >60 >60 mL/min   GFR calc Af Amer >60 >60 mL/min   Anion gap 13 5 - 15  APTT     Status: Abnormal   Collection Time: 12/20/19  4:26 AM  Result Value Ref Range   aPTT 90 (H) 24 - 36 seconds  Magnesium     Status: Abnormal   Collection Time: 12/20/19  4:26 AM  Result Value Ref Range   Magnesium 1.6 (L) 1.7 - 2.4 mg/dL  Glucose, capillary     Status: None   Collection Time: 12/20/19  7:55 AM  Result Value Ref Range   Glucose-Capillary 74 70 - 99 mg/dL   No results found.  Assessment/Plan:  Assessment: Rhonda Hale is a 74 yo F w/ a PMHx significant for COPD, HTN, hx of PE and metastatic ovarian cancer s/p TAH BSO in 2018 on chemotherapy with recent admission for large bowel obstruction now s/p surgery with ostomy creation here with fever and WBC found to have a C Diff  infection being treated with oral vanc.  Plan:  Active Problems:  C Diff Colitis: -pt with known metastatic ovarian cancer s/p TAH BSO in 2018 currently on chemotherapy here with large bowel obstruction with abrupt transition point in LUQ s/p surgery with ostomy creation  -presenting shortly after discharge for increased abdominal pain, fever and AMS -CT abdomen showed pancolitis and possible appendicitis -pt positive for C Diff toxin; of note she was hospitalized in mid December for UTI and treated with cipro and likely received perioperative antibiotics during her last admission -patient on oral vanc -febrile to 101.1 yesterday -WBC stable at 14.5 today  Plan: -continue oral vanc -monitor fever curve -daily CBC -fu surgery recs -fu blood cxs  Electrolyte abnormalities, Hypokalemia and Hypomagnesemia: -low K and mag, likely secondary to diarrhea -monitoring daily and repleting as needed  Hx PE: -continue home eliquis   Dispo: Anticipated discharge pending further clinical workup.  Al Decant, MD 12/20/2019, 11:34 AM Pager: [phone]

## 2019-12-20 NOTE — TOC Benefit Eligibility Note (Signed)
Transition of Care [facility]) Benefit Eligibility Note    Patient Details  Name: Rhonda Hale MRN: [id number] Date of Birth: [birth year]   Medication/Dose: vancomycin (VANCOCIN) capsule / pill form  4 times daily Administration Dose: 125 mg  Covered?: Yes  Tier: Other(4)  Prescription Coverage Preferred Pharmacy: Any retail Pharmacy  Spoke with Person/Company/Phone Number:: Anna/ [employer]/ [phone]  Co-Pay: 100.00 for 30 day retail  or the 90 day Mail order  Prior Approval: No  Deductible: Unmet(50.00)  Additional Notes: vancomycin (VANCOCIN) Oral Form Is not cover and will need prior Fabio Bering Phone Number: 12/20/2019, 1:51 PM

## 2019-12-21 DIAGNOSIS — K56609 Unspecified intestinal obstruction, unspecified as to partial versus complete obstruction: Secondary | ICD-10-CM

## 2019-12-21 DIAGNOSIS — Z90722 Acquired absence of ovaries, bilateral: Secondary | ICD-10-CM

## 2019-12-21 DIAGNOSIS — Z86711 Personal history of pulmonary embolism: Secondary | ICD-10-CM

## 2019-12-21 DIAGNOSIS — D72829 Elevated white blood cell count, unspecified: Secondary | ICD-10-CM

## 2019-12-21 DIAGNOSIS — Z90710 Acquired absence of both cervix and uterus: Secondary | ICD-10-CM

## 2019-12-21 DIAGNOSIS — I10 Essential (primary) hypertension: Secondary | ICD-10-CM

## 2019-12-21 DIAGNOSIS — A0472 Enterocolitis due to Clostridium difficile, not specified as recurrent: Principal | ICD-10-CM

## 2019-12-21 DIAGNOSIS — Z9221 Personal history of antineoplastic chemotherapy: Secondary | ICD-10-CM

## 2019-12-21 DIAGNOSIS — E876 Hypokalemia: Secondary | ICD-10-CM

## 2019-12-21 DIAGNOSIS — Z87440 Personal history of urinary (tract) infections: Secondary | ICD-10-CM

## 2019-12-21 DIAGNOSIS — Z8543 Personal history of malignant neoplasm of ovary: Secondary | ICD-10-CM

## 2019-12-21 DIAGNOSIS — J449 Chronic obstructive pulmonary disease, unspecified: Secondary | ICD-10-CM

## 2019-12-21 LAB — URINE CULTURE: Culture: 10000 — AB

## 2019-12-21 LAB — BASIC METABOLIC PANEL
Anion gap: 12 (ref 5–15)
Anion gap: 14 (ref 5–15)
BUN: <5 mg/dL — ABNORMAL LOW (ref 8–23)
BUN: <5 mg/dL — ABNORMAL LOW (ref 8–23)
CO2: 21 mmol/L — ABNORMAL LOW (ref 22–32)
CO2: 18 mmol/L — ABNORMAL LOW (ref 22–32)
Calcium: 8.3 mg/dL — ABNORMAL LOW (ref 8.9–10.3)
Calcium: 8.6 mg/dL — ABNORMAL LOW (ref 8.9–10.3)
Chloride: 104 mmol/L (ref 98–111)
Chloride: 102 mmol/L (ref 98–111)
Creatinine, Ser: 0.78 mg/dL (ref 0.44–1.00)
Creatinine, Ser: 0.76 mg/dL (ref 0.44–1.00)
GFR calc Af Amer: >60 mL/min (ref >60)
GFR calc Af Amer: >60 mL/min (ref >60)
GFR calc non Af Amer: >60 mL/min (ref >60)
GFR calc non Af Amer: >60 mL/min (ref >60)
Glucose, Bld: 84 mg/dL (ref 70–99)
Glucose, Bld: 86 mg/dL (ref 70–99)
Potassium: 2.9 mmol/L — ABNORMAL LOW (ref 3.5–5.1)
Potassium: 3.1 mmol/L — ABNORMAL LOW (ref 3.5–5.1)
Sodium: 137 mmol/L (ref 135–145)
Sodium: 134 mmol/L — ABNORMAL LOW (ref 135–145)

## 2019-12-21 LAB — CBC
HCT: 23.3 % — ABNORMAL LOW (ref 36.0–46.0)
Hemoglobin: 7.8 g/dL — ABNORMAL LOW (ref 12.0–15.0)
MCH: 31.2 pg (ref 26.0–34.0)
MCHC: 33.5 g/dL (ref 30.0–36.0)
MCV: 93.2 fL (ref 80.0–100.0)
Platelets: 570 10*3/uL — ABNORMAL HIGH (ref 150–400)
RBC: 2.5 MIL/uL — ABNORMAL LOW (ref 3.87–5.11)
RDW: 18.2 % — ABNORMAL HIGH (ref 11.5–15.5)
WBC: 16.4 10*3/uL — ABNORMAL HIGH (ref 4.0–10.5)
nRBC: 0.1 % (ref 0.0–0.2)

## 2019-12-21 LAB — HEPARIN LEVEL (UNFRACTIONATED): Heparin Unfractionated: 0.86 IU/mL — ABNORMAL HIGH (ref 0.30–0.70)

## 2019-12-21 LAB — GLUCOSE, CAPILLARY: Glucose-Capillary: 70 mg/dL (ref 70–99)

## 2019-12-21 LAB — APTT
aPTT: 50 seconds — ABNORMAL HIGH (ref 24–36)
aPTT: 80 seconds — ABNORMAL HIGH (ref 24–36)

## 2019-12-21 LAB — MAGNESIUM: Magnesium: 1.9 mg/dL (ref 1.7–2.4)

## 2019-12-21 MED ORDER — POTASSIUM CHLORIDE 10 MEQ/100ML IV SOLN
10.0000 meq | INTRAVENOUS | Status: AC
  Administered 2019-12-21 (×6): 10 meq via INTRAVENOUS
  Filled 2019-12-21 (×6): qty 100

## 2019-12-21 NOTE — Progress Notes (Signed)
Subjective:   [name] was resting comfortably in bed this morning on rounds. She was somewhat confused and had some trouble gathering her thoughts. She reports continued and unimproved abdominal pain. We discussed that we will continue antibiotics and pain control. We discussed with her daughter over the phone as well.  Consults: surgery  Objective:  Vital signs in last 24 hours: Vitals:   12/20/19 2031 12/21/19 0500 12/21/19 0544 12/21/19 0908  BP: 133/70  (!) 154/59   Pulse: (!) 101  96   Resp: 16  20   Temp: 99.2 F (37.3 C)  98.6 F (37 C)   TempSrc: Oral  Oral   SpO2: 98%  96% 97%  Weight:  73.8 kg     Physical Exam  Constitutional: No distress.  Cardiovascular: Normal rate, regular rhythm and intact distal pulses.  No murmur heard. Pulmonary/Chest: Effort normal. No respiratory distress.  Abdominal: Soft. Bowel sounds are normal. There is abdominal tenderness (to palpation).  Neurological: She is alert.  Skin: She is not diaphoretic. There is pallor.  Nursing note and vitals reviewed.  I/Os:  Intake/Output Summary (Last 24 hours) at 12/21/2019 1105 Last data filed at 12/21/2019 0600 Gross per 24 hour  Intake 1192.37 ml  Output 950 ml  Net 242.37 ml   Labs: Results for orders placed or performed during the hospital encounter of 12/18/19 (from the past 24 hour(s))  APTT     Status: Abnormal   Collection Time: 12/21/19  4:35 AM  Result Value Ref Range   aPTT 50 (H) 24 - 36 seconds  CBC     Status: Abnormal   Collection Time: 12/21/19  4:35 AM  Result Value Ref Range   WBC 16.4 (H) 4.0 - 10.5 K/uL   RBC 2.50 (L) 3.87 - 5.11 MIL/uL   Hemoglobin 7.8 (L) 12.0 - 15.0 g/dL   HCT 23.3 (L) 36.0 - 46.0 %   MCV 93.2 80.0 - 100.0 fL   MCH 31.2 26.0 - 34.0 pg   MCHC 33.5 30.0 - 36.0 g/dL   RDW 18.2 (H) 11.5 - 15.5 %   Platelets 570 (H) 150 - 400 K/uL   nRBC 0.1 0.0 - 0.2 %  Heparin level (unfractionated)     Status: Abnormal   Collection Time: 12/21/19  4:35 AM    Result Value Ref Range   Heparin Unfractionated 0.86 (H) 0.30 - 0.70 IU/mL  Magnesium     Status: None   Collection Time: 12/21/19  4:35 AM  Result Value Ref Range   Magnesium 1.9 1.7 - 2.4 mg/dL  Glucose, capillary     Status: None   Collection Time: 12/21/19  7:57 AM  Result Value Ref Range   Glucose-Capillary 70 70 - 99 mg/dL  Basic metabolic panel     Status: Abnormal   Collection Time: 12/21/19  8:42 AM  Result Value Ref Range   Sodium 137 135 - 145 mmol/L   Potassium 2.9 (L) 3.5 - 5.1 mmol/L   Chloride 104 98 - 111 mmol/L   CO2 21 (L) 22 - 32 mmol/L   Glucose, Bld 84 70 - 99 mg/dL   BUN <5 (L) 8 - 23 mg/dL   Creatinine, Ser 0.78 0.44 - 1.00 mg/dL   Calcium 8.3 (L) 8.9 - 10.3 mg/dL   GFR calc non Af Amer >60 >60 mL/min   GFR calc Af Amer >60 >60 mL/min   Anion gap 12 5 - 15   No results found.  Assessment/Plan:  Assessment: Rhonda Hale is a 74 yo F w/ a PMHx significant for COPD, HTN, hx of PE and metastatic ovarian cancer s/p TAH BSO in 2018 on chemotherapy with recent admission for large bowel obstruction now s/p surgery with ostomy creation here with fever and WBC found to have a C Diff infection being treated with oral vanc.  Plan:  Active Problems:  C Diff Colitis: -pt with known metastatic ovarian cancer s/p TAH BSO in 2018 currently on chemotherapy here with large bowel obstruction with abrupt transition point in LUQ s/p surgery with ostomy creation  -presenting shortly after discharge for increased abdominal pain, fever and AMS -CT abdomen showed pancolitis and possible appendicitis -pt positive for C Diff toxin; of note she was hospitalized in mid December for UTI and treated with cipro and likely received perioperative antibiotics during her last admission -patient on oral vanc -afebrile; WBC 16 today from 14 yesterday  Plan: -continue oral vanc -monitor fever curve -daily CBC -fu blood cxs  Electrolyte abnormalities, Hypokalemia and  Hypomagnesemia: -low K and mag, likely secondary to diarrhea -monitoring daily and repleting as needed  Hx PE: -continue heparin per surgery   Dispo: Anticipated discharge pending further clinical workup.  Al Decant, MD 12/21/2019, 11:05 AM Pager: [phone]

## 2019-12-21 NOTE — Progress Notes (Signed)
ANTICOAGULATION CONSULT NOTE - Follow-Up Consult  Pharmacy Consult for heparin Indication: hx of VTE  Patient Measurements: Heparin Dosing Weight: 66 kg  Vital Signs: Temp: 98.2 F (36.8 C) (01/09 1517) Temp Source: Oral (01/09 1517) BP: 131/60 (01/09 1517) Pulse Rate: 91 (01/09 1517)  Labs: Recent Labs    12/19/19 0500 12/20/19 0426 12/21/19 0435 12/21/19 0842 12/21/19 1605  HGB 7.9* 7.8* 7.8*  --   --   HCT 24.1* 23.5* 23.3*  --   --   PLT 550* 532* 570*  --   --   APTT  --  90* 50*  --  80*  HEPARINUNFRC  --   --  0.86*  --   --   CREATININE 0.95 0.75  --  0.78  --     Estimated Creatinine Clearance: 57.2 mL/min (by C-G formula based on SCr of 0.78 mg/dL).   Medical History: Past Medical History:  Diagnosis Date  . Cancer ([place])   . COPD (chronic obstructive pulmonary disease) ([place])   . Hypertension   . PRES (posterior reversible encephalopathy syndrome)   . SBO (small bowel obstruction) ([place]) 11/2019   . heparin 1,100 Units/hr (12/21/19 1500)  . methocarbamol (ROBAXIN) IV Stopped (12/21/19 0403)  . potassium chloride 10 mEq (12/21/19 1546)     Assessment: 74 yo female admitted with fever and altered mental status. She had a recent SBO with resultant colostomy. She is on Eliquis for hx of PE. Her last dose was 1/7 around noon. She continues on heparin while Eliquis on hold.     Heparin level is elevated but is likely a reflection of recent Apixaban dosing.  Will continue to use aPTTs for heparin monitoring until Apixaban has cleared.  Heparin level currently at goal.  Goal of Therapy:  APTT 66-102 Heparin level 0.3-0.7 units/ml Monitor platelets by anticoagulation protocol: Yes   Plan:  -Continue IV Heparin at 1100 units/hr -Check aPTT in 6 hours.  Marguerite Olea, [facility] Clinical Pharmacist Phone [phone]  12/21/2019 4:38 PM

## 2019-12-21 NOTE — Progress Notes (Signed)
ANTICOAGULATION CONSULT NOTE - Follow-Up Consult  Pharmacy Consult for heparin Indication: hx of VTE  Patient Measurements: Heparin Dosing Weight: 66 kg  Vital Signs: Temp: 98.6 F (37 C) (01/09 0544) Temp Source: Oral (01/09 0544) BP: 154/59 (01/09 0544) Pulse Rate: 96 (01/09 0544)  Labs: Recent Labs    12/18/19 1900 12/19/19 0500 12/20/19 0426 12/21/19 0435  HGB 8.6* 7.9* 7.8* 7.8*  HCT 25.4* 24.1* 23.5* 23.3*  PLT 540* 550* 532* 570*  APTT  --   --  90* 50*  HEPARINUNFRC  --   --   --  0.86*  CREATININE 0.92 0.95 0.75  --     Estimated Creatinine Clearance: 57.2 mL/min (by C-G formula based on SCr of 0.75 mg/dL).   Medical History: Past Medical History:  Diagnosis Date  . Cancer ([place])   . COPD (chronic obstructive pulmonary disease) ([place])   . Hypertension   . PRES (posterior reversible encephalopathy syndrome)   . SBO (small bowel obstruction) ([place]) 11/2019     Assessment: 74 yo female admitted with fever and altered mental status. She had a recent SBO with resultant colostomy. She is on Eliquis for hx of PE. Her last dose was 1/7 around noon. She continues on heparin while Eliquis on hold.    APTT is 50 (sub-therapeutic) on heparin at 1000 units/hr.  Heparin level is elevated but is likely a reflection of recent Apixaban dosing.  Will continue to use aPTTs for heparin monitoring until Apixaban has cleared.  Goal of Therapy:  APTT 66-102 Heparin level 0.3-0.7 units/ml Monitor platelets by anticoagulation protocol: Yes   Plan:  -Increase Heparin to 1100 units/hr -Check aPTT in 6 hours.  -Resume Apixaban?  [employer], Pharm.D., BCPS Clinical Pharmacist  **Pharmacist phone directory can be found on [email or web address] listed under [place].  12/21/2019 6:47 AM

## 2019-12-21 NOTE — Progress Notes (Signed)
Patient complained of abdominal pain originating from ostomy site, nausea and vomiting. Emesis occurred one time with green discoloration. Patient states she also feels dehydrated. Pain and nausea medication administered. Followed up with patient to see if medication effective. Patient states she is unsure. MD paged. Call returned. Stated will come up and see patient.

## 2019-12-22 ENCOUNTER — Inpatient Hospital Stay (HOSPITAL_COMMUNITY): Payer: Medicare Other

## 2019-12-22 LAB — GI PATHOGEN PANEL BY PCR, STOOL

## 2019-12-22 LAB — LACTIC ACID, PLASMA: Lactic Acid, Venous: 0.7 mmol/L (ref 0.5–1.9)

## 2019-12-22 LAB — CBC
HCT: 25.1 % — ABNORMAL LOW (ref 36.0–46.0)
Hemoglobin: 8.5 g/dL — ABNORMAL LOW (ref 12.0–15.0)
MCH: 31.1 pg (ref 26.0–34.0)
MCHC: 33.9 g/dL (ref 30.0–36.0)
MCV: 91.9 fL (ref 80.0–100.0)
Platelets: 611 10*3/uL — ABNORMAL HIGH (ref 150–400)
RBC: 2.73 MIL/uL — ABNORMAL LOW (ref 3.87–5.11)
RDW: 18.1 % — ABNORMAL HIGH (ref 11.5–15.5)
WBC: 16.6 10*3/uL — ABNORMAL HIGH (ref 4.0–10.5)
nRBC: 0.1 % (ref 0.0–0.2)

## 2019-12-22 LAB — BASIC METABOLIC PANEL
Anion gap: 16 — ABNORMAL HIGH (ref 5–15)
BUN: <5 mg/dL — ABNORMAL LOW (ref 8–23)
CO2: 19 mmol/L — ABNORMAL LOW (ref 22–32)
Calcium: 8.8 mg/dL — ABNORMAL LOW (ref 8.9–10.3)
Chloride: 101 mmol/L (ref 98–111)
Creatinine, Ser: 0.85 mg/dL (ref 0.44–1.00)
GFR calc Af Amer: >60 mL/min (ref >60)
GFR calc non Af Amer: >60 mL/min (ref >60)
Glucose, Bld: 96 mg/dL (ref 70–99)
Potassium: 3.4 mmol/L — ABNORMAL LOW (ref 3.5–5.1)
Sodium: 136 mmol/L (ref 135–145)

## 2019-12-22 LAB — GLUCOSE, CAPILLARY
Glucose-Capillary: 88 mg/dL (ref 70–99)
Glucose-Capillary: 85 mg/dL (ref 70–99)

## 2019-12-22 LAB — HEPARIN LEVEL (UNFRACTIONATED): Heparin Unfractionated: 0.51 IU/mL (ref 0.30–0.70)

## 2019-12-22 LAB — MAGNESIUM: Magnesium: 1.4 mg/dL — ABNORMAL LOW (ref 1.7–2.4)

## 2019-12-22 LAB — APTT: aPTT: 81 seconds — ABNORMAL HIGH (ref 24–36)

## 2019-12-22 MED ORDER — MAGNESIUM SULFATE IN D5W 1-5 GM/100ML-% IV SOLN
1.0000 g | Freq: Once | INTRAVENOUS | Status: AC
  Administered 2019-12-22: 1 g via INTRAVENOUS
  Filled 2019-12-22: qty 100

## 2019-12-22 MED ORDER — SODIUM CHLORIDE 0.9 % IV SOLN: INTRAVENOUS | Status: DC

## 2019-12-22 MED ORDER — POTASSIUM CHLORIDE 20 MEQ PO PACK
30.0000 meq | PACK | Freq: Four times a day (QID) | ORAL | Status: DC
  Administered 2019-12-22: 30 meq via ORAL
  Filled 2019-12-22: qty 2

## 2019-12-22 MED ORDER — LACTATED RINGERS IV SOLN: INTRAVENOUS | Status: DC

## 2019-12-22 MED ORDER — POTASSIUM CHLORIDE CRYS ER 20 MEQ PO TBCR
20.0000 meq | EXTENDED_RELEASE_TABLET | Freq: Four times a day (QID) | ORAL | Status: AC
  Administered 2019-12-22 (×2): 20 meq via ORAL
  Filled 2019-12-22 (×2): qty 1

## 2019-12-22 NOTE — Progress Notes (Addendum)
ANTICOAGULATION CONSULT NOTE - Follow-Up Consult  Pharmacy Consult for heparin Indication: hx of VTE  Patient Measurements: Heparin Dosing Weight: 66 kg  Vital Signs: Temp: 98.6 F (37 C) (01/10 0513) Temp Source: Oral (01/10 0513) BP: 134/64 (01/10 0513) Pulse Rate: 101 (01/10 0513)  Labs: Recent Labs    12/20/19 0426 12/21/19 0435 12/21/19 0842 12/21/19 1605 12/21/19 2205 12/22/19 0332  HGB 7.8* 7.8*  --   --   --  8.5*  HCT 23.5* 23.3*  --   --   --  25.1*  PLT 532* 570*  --   --   --  611*  APTT 90* 50*  --  80*  --  81*  HEPARINUNFRC  --  0.86*  --   --   --  0.51  CREATININE 0.75  --  0.78  --  0.76 0.85    Estimated Creatinine Clearance: 53.8 mL/min (by C-G formula based on SCr of 0.85 mg/dL).   Medical History: Past Medical History:  Diagnosis Date  . Cancer ([place])   . COPD (chronic obstructive pulmonary disease) ([place])   . Hypertension   . PRES (posterior reversible encephalopathy syndrome)   . SBO (small bowel obstruction) ([place]) 11/2019   . heparin 1,100 Units/hr (12/21/19 1500)  . lactated ringers    . magnesium sulfate bolus IVPB    . methocarbamol (ROBAXIN) IV Stopped (12/21/19 0403)     Assessment: 74 yo female admitted with fever and altered mental status. She had a recent SBO with resultant colostomy. She is on Eliquis for hx of PE. Her last dose was 1/7 around noon. She continues on heparin while Eliquis on hold.    Heparin level currently at goal, aptt correlating, will continue to monitor for any signs or symptoms of bleeding. CBC stable but low with hgb 8.5, hct 25.1, platelets 611.  Goal of Therapy: Heparin level 0.3-0.7 units/ml Monitor platelets by anticoagulation protocol: Yes   Plan:  -Continue IV Heparin at 1100 units/hr -Daily heparin levels and CBC -follow-up plans for restart of home apixaban  Thank you for the interesting consult and for involving pharmacy in this patient's care.  Tamela Gammon, PharmD, BCPS 12/22/2019 7:11  AM PGY-2 Pharmacy Administration Resident Please check [email or web address] for unit-specific pharmacist phone numbers

## 2019-12-22 NOTE — Progress Notes (Addendum)
   Subjective: HD#3   Overnight: Per overnight team, she had episodes of nausea and vomiting which resolved with Zofran  Today, Delice Bison reports resolution of nausea and emesis.  On further questioning, she states that her emesis was bilious in nature.  Though she denies associated abdominal pain she also endorses increased belching and right-sided abdominal pain that is worse with cough and belching.  She denies fevers, chills.  She also reports of decreased appetite in the past week  Objective:  Vital signs in last 24 hours: Vitals:   12/21/19 0908 12/21/19 1517 12/21/19 2130 12/22/19 0513  BP:  131/60 (!) 142/73 134/64  Pulse:  91 (!) 102 (!) 101  Resp:   19 19  Temp:  98.2 F (36.8 C) 98.9 F (37.2 C) 98.6 F (37 C)  TempSrc:  Oral Oral Oral  SpO2: 97% 96% 96% 96%  Weight:       Const: In no apparent distress, lying comfortably in bed, conversational CV: RRR, no murmurs, gallop, rub Abd: Ostomy with good output, stool color is green and somewhat watery.  Assessment/Plan:  Principal Problem:   Enteritis due to Clostridium difficile Active Problems:   Fever   Ovary cancer ([place])   Abdominal pain   Pancolitis ([place])  Assessment: Ms. Shoji is a 74 yo F w/ a PMHx significant for COPD, HTN, hx of PE and metastatic ovarian cancer s/p TAH BSO in 2018 on chemotherapy with recent admission for large bowel obstruction now s/p surgery with ostomy creation here with fever and WBC found to have a C Diff infection being treated with oral vanc.  Plan:  Active Problems:  C Diff Colitis: -pt with known metastatic ovarian cancer s/p TAH BSO in 2018 currently on chemotherapy here with large bowel obstruction with abrupt transition point in LUQ s/p surgery with ostomy creation  -presenting shortly after discharge for increased abdominal pain, fever and AMS -CT abdomen showed pancolitis and possible appendicitis -pt positive for C Diff toxin; of note she was hospitalized in mid  December for UTI and treated with cipro and likely received perioperative antibiotics during her last admission -patient on oral vanc -Continues to remain afebrile though mildly tachycardic at 101-102.  Leukocytosis 16.6<<16.4<<14.5 -Blood culture no growth to date x3 days  Plan: -continue oral vanc -monitor fever curve -daily CBC -fu blood cxs -If abdominal discomfort, nausea and emesis worsens, she might warrant imaging of her abdomen   Elevated anion gap metabolic acidosis -AG 16, serum bicarb 19 (<<18<<21).  Differential diagnosis includes lactic acidosis versus GI losses though I would rather expect to see normal anion gap metabolic acidosis. -Follow-up lactic acid -IVF resuscitation   Electrolyte abnormalities, Hypokalemia and Hypomagnesemia: -Repleted with both IV and oral supplements -Monitor electrolytes daily   Hx PE: -continue heparin per surgery    Dispo: Anticipated discharge pending further clinical workup.  Jean Rosenthal, MD 12/22/2019, 8:29 AM Pager: [phone] Internal Medicine Teaching Service

## 2019-12-23 DIAGNOSIS — Z978 Presence of other specified devices: Secondary | ICD-10-CM

## 2019-12-23 DIAGNOSIS — Z7901 Long term (current) use of anticoagulants: Secondary | ICD-10-CM

## 2019-12-23 DIAGNOSIS — Z888 Allergy status to other drugs, medicaments and biological substances status: Secondary | ICD-10-CM

## 2019-12-23 LAB — GLUCOSE, CAPILLARY
Glucose-Capillary: 67 mg/dL — ABNORMAL LOW (ref 70–99)
Glucose-Capillary: 68 mg/dL — ABNORMAL LOW (ref 70–99)
Glucose-Capillary: 102 mg/dL — ABNORMAL HIGH (ref 70–99)

## 2019-12-23 LAB — CBC
HCT: 23.5 % — ABNORMAL LOW (ref 36.0–46.0)
Hemoglobin: 7.7 g/dL — ABNORMAL LOW (ref 12.0–15.0)
MCH: 30.4 pg (ref 26.0–34.0)
MCHC: 32.8 g/dL (ref 30.0–36.0)
MCV: 92.9 fL (ref 80.0–100.0)
Platelets: 531 10*3/uL — ABNORMAL HIGH (ref 150–400)
RBC: 2.53 MIL/uL — ABNORMAL LOW (ref 3.87–5.11)
RDW: 18.1 % — ABNORMAL HIGH (ref 11.5–15.5)
WBC: 14.4 10*3/uL — ABNORMAL HIGH (ref 4.0–10.5)
nRBC: 0.3 % — ABNORMAL HIGH (ref 0.0–0.2)

## 2019-12-23 LAB — BASIC METABOLIC PANEL
Anion gap: 13 (ref 5–15)
BUN: <5 mg/dL — ABNORMAL LOW (ref 8–23)
CO2: 18 mmol/L — ABNORMAL LOW (ref 22–32)
Calcium: 8.4 mg/dL — ABNORMAL LOW (ref 8.9–10.3)
Chloride: 109 mmol/L (ref 98–111)
Creatinine, Ser: 0.77 mg/dL (ref 0.44–1.00)
GFR calc Af Amer: >60 mL/min (ref >60)
GFR calc non Af Amer: >60 mL/min (ref >60)
Glucose, Bld: 79 mg/dL (ref 70–99)
Potassium: 2.9 mmol/L — ABNORMAL LOW (ref 3.5–5.1)
Sodium: 140 mmol/L (ref 135–145)

## 2019-12-23 LAB — LACTIC ACID, PLASMA: Lactic Acid, Venous: 0.6 mmol/L (ref 0.5–1.9)

## 2019-12-23 LAB — CULTURE, BLOOD (ROUTINE X 2)
Culture: NO GROWTH
Culture: NO GROWTH
Special Requests: ADEQUATE

## 2019-12-23 LAB — APTT: aPTT: 75 seconds — ABNORMAL HIGH (ref 24–36)

## 2019-12-23 LAB — MAGNESIUM: Magnesium: 1.3 mg/dL — ABNORMAL LOW (ref 1.7–2.4)

## 2019-12-23 LAB — HEPARIN LEVEL (UNFRACTIONATED): Heparin Unfractionated: 0.23 IU/mL — ABNORMAL LOW (ref 0.30–0.70)

## 2019-12-23 MED ORDER — HEPARIN SOD (PORK) LOCK FLUSH 100 UNIT/ML IV SOLN
500.0000 [IU] | INTRAVENOUS | Status: AC | PRN
  Administered 2019-12-23: 500 [IU]
  Filled 2019-12-23: qty 5

## 2019-12-23 MED ORDER — DEXTROSE-NACL 5-0.45 % IV SOLN: INTRAVENOUS | Status: DC

## 2019-12-23 MED ORDER — POTASSIUM CHLORIDE CRYS ER 20 MEQ PO TBCR: 40.0000 meq | EXTENDED_RELEASE_TABLET | Freq: Two times a day (BID) | ORAL | Status: DC

## 2019-12-23 MED ORDER — POTASSIUM CHLORIDE 10 MEQ/100ML IV SOLN
10.0000 meq | INTRAVENOUS | Status: AC
  Administered 2019-12-23 (×6): 10 meq via INTRAVENOUS
  Filled 2019-12-23 (×6): qty 100

## 2019-12-23 MED ORDER — VANCOMYCIN HCL 125 MG PO CAPS: 125.0000 mg | ORAL_CAPSULE | Freq: Four times a day (QID) | ORAL | 0 refills | Status: AC

## 2019-12-23 MED ORDER — MAGNESIUM SULFATE 4 GM/100ML IV SOLN
4.0000 g | Freq: Once | INTRAVENOUS | Status: DC
  Filled 2019-12-23: qty 100

## 2019-12-23 MED ORDER — POTASSIUM CHLORIDE ER 10 MEQ PO TBCR: 30.0000 meq | EXTENDED_RELEASE_TABLET | Freq: Two times a day (BID) | ORAL | 0 refills | Status: DC

## 2019-12-23 MED FILL — POTASSIUM CHL ER M10 TABLET: 10 | 14 days supply | Qty: 84 | Fill #0

## 2019-12-23 NOTE — Progress Notes (Signed)
[facility] Progress Note     Subjective: CC-  Overall feeling better. No longer having any abdominal crampy. Continues to have some nausea, no emesis in >24 hours. Denies bloating. Does not like the food on [facility] menu. Having bowel function. States that she did not get to get out of bed yesterday. Anxious to get home and be with family.  Objective: Vital signs in last 24 hours: Temp:  [98.8 F (37.1 C)-99.1 F (37.3 C)] 99.1 F (37.3 C) (01/11 0600) Pulse Rate:  [93-105] 93 (01/11 0600) Resp:  [17-19] 19 (01/11 0600) BP: (131-142)/(65-75) 131/75 (01/11 0600) SpO2:  [97 %-98 %] 98 % (01/11 0600) Weight:  [73.4 kg] 73.4 kg (01/11 0500) Last BM Date: 12/21/19  Intake/Output from previous day: 01/10 0701 - 01/11 0700 In: 937.6 [P.O.:60; I.V.:877.6] Out: 2025 [Urine:1500; Stool:525] Intake/Output this shift: No intake/output data recorded.  PE: Gen: Alert, NAD, pleasant, uncomfortable Pulm:Rate andeffort normal Abd: Soft,mild distension, nontender, [place] pouch over ostomy/ ostomy retracted/ red rubber catheter x2 in place/ liquid yellow fluid in pouch   Lab Results:  Recent Labs    12/22/19 0332 12/23/19 0328  WBC 16.6* 14.4*  HGB 8.5* 7.7*  HCT 25.1* 23.5*  PLT 611* 531*   BMET Recent Labs    12/22/19 0332 12/23/19 0328  NA 136 140  K 3.4* 2.9*  CL 101 109  CO2 19* 18*  GLUCOSE 96 79  BUN <5* <5*  CREATININE 0.85 0.77  CALCIUM 8.8* 8.4*   PT/INR No results for input(s): LABPROT, INR in the last 72 hours. CMP     Component Value Date/Time   NA 140 12/23/2019 0328   NA 140 09/04/2019 1420   K 2.9 (L) 12/23/2019 0328   CL 109 12/23/2019 0328   CO2 18 (L) 12/23/2019 0328   GLUCOSE 79 12/23/2019 0328   BUN <5 (L) 12/23/2019 0328   BUN 12 09/04/2019 1420   CREATININE 0.77 12/23/2019 0328   CALCIUM 8.4 (L) 12/23/2019 0328   PROT 5.6 (L) 12/18/2019 1900   PROT 6.2 07/24/2019 1144   ALBUMIN 2.2 (L) 12/18/2019 1900   ALBUMIN 4.1  07/24/2019 1144   AST 17 12/18/2019 1900   ALT 15 12/18/2019 1900   ALKPHOS 64 12/18/2019 1900   BILITOT 0.7 12/18/2019 1900   BILITOT <0.2 07/24/2019 1144   GFRNONAA >60 12/23/2019 0328   GFRAA >60 12/23/2019 0328   Lipase     Component Value Date/Time   LIPASE 20 12/09/2019 1626       Studies/Results: DG Abd 1 View  Result Date: 12/22/2019 CLINICAL DATA:  Abdominal pain, post diverting colostomy EXAM: ABDOMEN - 1 VIEW COMPARISON:  12/10/2019 abdominal radiograph and 12/19/2019 CT abdomen/pelvis FINDINGS: Surgical drains overlie the midline abdomen, unchanged. Mildly dilated small bowel loops in the left and central abdomen, mildly increased. No evidence of pneumatosis or pneumoperitoneum. Cholecystectomy clips are seen in the right upper quadrant of the abdomen. No radiopaque nephrolithiasis. Clear lung bases. Partially visualized right hip arthroplasty. IMPRESSION: Mildly dilated small bowel loops in the left and central abdomen, mildly increased, compatible with mild postoperative adynamic ileus. Electronically Signed   By: Ilona Sorrel M.D.   On: 12/22/2019 14:34    Anti-infectives: Anti-infectives (From admission, onward)   Start     Dose/Rate Route Frequency Ordered Stop   12/20/19 0000  vancomycin (VANCOCIN) 125 MG capsule  Status:  Discontinued     125 mg Oral 4 times daily 12/20/19 1456 12/20/19    12/20/19 0000  vancomycin (VANCOCIN) 125 MG capsule     125 mg Oral 4 times daily 12/20/19 1511     12/19/19 1500  vancomycin (VANCOCIN) 50 mg/mL oral solution 125 mg     125 mg Oral 4 times daily 12/19/19 1353 12/29/19 1359   12/19/19 0800  piperacillin-tazobactam (ZOSYN) IVPB 3.375 g  Status:  Discontinued     3.375 g 12.5 mL/hr over 240 Minutes Intravenous Every 8 hours 12/19/19 0237 12/19/19 1354   12/19/19 0245  piperacillin-tazobactam (ZOSYN) IVPB 3.375 g     3.375 g 100 mL/hr over 30 Minutes Intravenous  Once 12/19/19 0237 12/19/19 0331        Assessment/Plan Recurrent ovarian cancer s/p TAH BSO in 2018 on chemotherapy H/o PE on eliquis COPD HTN  Colonic obstruction POD12 S/pdiverting loop colostomy creation12/30 Dr. Kieth Brightly --discharged 1/5 and readmitted 1/6-- C diff colitis - On PO Vanc, continue - WOCN for assistance in stoma care and [place] pouch   ID -zosyn x1, oral vancomycin 1/7>> FEN -IVF, soft diet, replacing K and Mag VTE -SCDs, IV heparin Foley -none Follow up - Dr. Kieth Brightly  Plan: Advance diet to soft. Mobilize. Will ask PT to see. If patient is tolerating diet, mobilizing, pain controlled, and nausea improves she may be discharged from surgical standpoint. She will need to be seen in our office for follow up quickly after discharge. Likely planning to remove red rubber catheters ~POD#14.   LOS: 4 days    [facility] 12/23/2019, 8:30 AM Please see Amion for pager number during day hours 7:00am-4:30pm

## 2019-12-23 NOTE — Progress Notes (Signed)
   Subjective: HD#4   Overnight: No acute events  Ms. Langwell reports near complete resolution of her abdominal pain. She did report some nausea yesterday but no vomiting. She is hopeful to go home today.  Objective:  Vital signs in last 24 hours: Vitals:   12/22/19 1502 12/22/19 2115 12/23/19 0500 12/23/19 0600  BP: 139/66 (!) 142/65  131/75  Pulse: (!) 105 100  93  Resp: 18 17  19   Temp: 98.8 F (37.1 C) 98.8 F (37.1 C)  99.1 F (37.3 C)  TempSrc: Oral Oral  Oral  SpO2: 97% 97%  98%  Weight:   73.4 kg    Physical Exam  Constitutional: No distress.  HENT:  Head: Normocephalic.  Cardiovascular: Normal rate, regular rhythm and normal heart sounds.  No murmur heard. Pulmonary/Chest: Effort normal. No respiratory distress.  Abdominal: Soft. Bowel sounds are normal. She exhibits no distension. There is no abdominal tenderness.  Ostomy with green liquid output  Musculoskeletal:        General: Normal range of motion.     Cervical back: Normal range of motion.  Neurological: She is alert.  Skin: Skin is warm and dry. She is not diaphoretic.  Psychiatric: Affect normal.  Nursing note and vitals reviewed.  Assessment/Plan:  Principal Problem:   Enteritis due to Clostridium difficile Active Problems:   Fever   Ovary cancer ([place])   Abdominal pain   Pancolitis ([place])  Assessment: Ms. Kanode is a 74 yo F w/ a PMHx significant for COPD, HTN, hx of PE and metastatic ovarian cancer s/p TAH BSO in 2018 on chemotherapy with recent admission for large bowel obstruction now s/p surgery with ostomy creation here with fever and WBC found to have a C Diff infection being treated with oral vanc.  Plan:  Active Problems:  C Diff Colitis: -pt with known metastatic ovarian cancer s/p TAH BSO in 2018 currently on chemotherapy here with large bowel obstruction with abrupt transition point in LUQ s/p surgery with ostomy creation  -presenting shortly after discharge for increased  abdominal pain, fever and AMS -CT abdomen showed pancolitis and possible appendicitis -pt positive for C Diff toxin; of note she was hospitalized in mid December for UTI and treated with cipro and likely received perioperative antibiotics during her last admission -patient on oral vanc -Continues to remain afebrile. Leukocytosis downtrending from 16.6 yesterday to 14.4 today. Lactic acid 0.6. -Blood culture no growth to date x5 days -patient reports near total resolution of her abdominal pain  Plan: -continue oral vanc  Electrolyte abnormalities, Hypokalemia and Hypomagnesemia: -Repleted with both IV and oral supplements -Monitor electrolytes daily -dc on kdur  Hx PE: -continue heparin per surgery  -resume eliquis on dc  Dispo: Anticipated discharge today  Al Decant, MD 12/23/2019, 11:00 AM Pager: [phone] Internal Medicine Teaching Service

## 2019-12-23 NOTE — Discharge Instructions (Signed)
You were admitted to the hospital for abdominal pain and found to have an infection of your intestines which we are treating with an oral antibiotic. Please follow up with your surgeon in 1-2 days and with your primary care doctor within one week.

## 2019-12-23 NOTE — TOC Progression Note (Signed)
Transition of Care [facility]) - Progression Note    Patient Details  Name: Rhonda Hale MRN: [id number] Date of Birth: [birth year]  [facility] CM/SW Contact  Jacalyn Lefevre Edson Snowball, RN Phone Number: 12/23/2019, 12:08 PM  Clinical Narrative:     Tommi Rumps with [facility] aware discharge is today. Scripts sent to Transition of care pharmacy.   Expected Discharge Plan: [place] Barriers to Discharge: Continued Medical Work up  Expected Discharge Plan and Services Expected Discharge Plan: [place]   Discharge Planning Services: CM Consult Post Acute Care Choice: [place] arrangements for the past 2 months: Single Family Home Expected Discharge Date: 12/23/19               DME Arranged: N/A         HH Arranged: RN HH Agency: [facility] Date [place]: 12/20/19 Time [place]: 1509 Representative spoke with at [place]: Tommi Rumps, I have requested order from DR Antelope Determinants of Health (SDOH) Interventions    Readmission Risk Interventions Readmission Risk Prevention Plan 12/13/2019 12/10/2019 07/05/2019  Post Dischage Appt - - Complete  Medication Screening - - Complete  Transportation Screening Complete Complete Complete  PCP or Specialist Appt within 3-5 Days Complete Not Complete -  Not Complete comments - pending medical progression -  HRI or Home Care Consult Complete Complete -  Social Work Consult for [place] Planning/Counseling Complete Complete -  Palliative Care Screening Complete Not Complete -  Medication Review ([place]) Referral to Pharmacy Complete -  Some recent data might be hidden

## 2019-12-23 NOTE — Progress Notes (Signed)
ANTICOAGULATION CONSULT NOTE - Follow Up Consult  Pharmacy Consult for Heparin Indication: h/o VTE  Allergies  Allergen Reactions  . Atorvastatin Other (See Comments)    Severe heartburn, reflux    Patient Measurements: Weight: 162 lb 11.2 oz (73.8 kg) Heparin Dosing Weight: 66kg  Vital Signs: Temp: 98.8 F (37.1 C) (01/10 2115) Temp Source: Oral (01/10 2115) BP: 142/65 (01/10 2115) Pulse Rate: 100 (01/10 2115)  Labs: Recent Labs    12/20/19 0426 12/21/19 0435 12/21/19 0842 12/21/19 1605 12/21/19 2205 12/22/19 0332 12/23/19 0328  HGB   < > 7.8*  --   --   --  8.5* 7.7*  HCT  --  23.3*  --   --   --  25.1* 23.5*  PLT  --  570*  --   --   --  611* 531*  APTT   < > 50*  --  80*  --  81* 75*  HEPARINUNFRC  --  0.86*  --   --   --  0.51 0.23*  CREATININE  --   --  0.78  --  0.76 0.85  --    < > = values in this interval not displayed.    Estimated Creatinine Clearance: 53.8 mL/min (by C-G formula based on SCr of 0.85 mg/dL).   Assessment: Anticoag: Apixaban (LD 1/7 @ 1225) for hx PE.  - aPTT 73, but heparin level 0.23 low   Goal of Therapy:  Heparin level 0.3-0.7 units/ml Monitor platelets by anticoagulation protocol: Yes   Plan:  Increase IV heparin to 1250 units/hr. Recheck in 6 hrs. D/c daily aPTTs Daily HL and CBC   Random Dobrowski S. Alford Highland, PharmD, BCPS Clinical Staff Pharmacist [email or web address] Alford Highland, Ameena Vesey Stillinger 12/23/2019,4:09 AM

## 2019-12-23 NOTE — Evaluation (Signed)
Physical Therapy Evaluation Patient Details Name: Rhonda Hale MRN: [id number] DOB: [birth year] Today's Date: 12/23/2019   History of Present Illness  Patient is a 74 year old female with past medical history of COPD, hypertension, hypothyroidism, ovarian cancer with recent SBO with colostomy on 12/30 who presented after episode of fever and altered mental status.  CT revealed enlarged appendix as well as pancolitis,  Positive for c-diff colitis.  Clinical Impression  Patient presents with mobility limited due to pain in abdomen and generalized weakness.  Currently min A for bed mobility and minguard for ambulation.  Last admission was able to mobilize with supervision level assistance.  She has adequate help in the home, but feel she will benefit from follow up HHPT to improve mobility, independence and safety.  Will follow acutely.    Follow Up Recommendations Supervision/Assistance - 24 hour;Home health PT    Equipment Recommendations  None recommended by PT    Recommendations for Other Services       Precautions / Restrictions Precautions Precautions: Fall      Mobility  Bed Mobility Overal bed mobility: Needs Assistance Bed Mobility: Supine to Sit     Supine to sit: Min assist;HOB elevated     General bed mobility comments: discussed technique and pt performed with min A to lift trunk from supine  Transfers Overall transfer level: Needs assistance Equipment used: Rolling walker (2 wheeled) Transfers: Sit to/from Stand Sit to Stand: Supervision         General transfer comment: increased time, used UE support appropriately pushing from bed to stand  Ambulation/Gait Ambulation/Gait assistance: Min guard Gait Distance (Feet): 200 Feet Assistive device: Rolling walker (2 wheeled) Gait Pattern/deviations: Step-through pattern;Decreased stride length;Trunk flexed     General Gait Details: mild trunk flexion due to pain, able to ambulate with RW and minguard for  support due to pain, general weakness  Stairs            Wheelchair Mobility    Modified Rankin (Stroke Patients Only)       Balance Overall balance assessment: Needs assistance Sitting-balance support: Feet supported Sitting balance-Leahy Scale: Good     Standing balance support: No upper extremity supported Standing balance-Leahy Scale: Fair Standing balance comment: can stand without UE support to adjust clothing, check colostomy, but RW for ambulation                             Pertinent Vitals/Pain Pain Assessment: 0-10 Pain Score: 8  Pain Location: R abdomen Pain Descriptors / Indicators: Stabbing;Sharp Pain Intervention(s): Monitored during session;Repositioned    Home Living Family/patient expects to be discharged to:: Private residence Living Arrangements: Children Available Help at Discharge: Family;Available 24 hours/day Type of Home: House Home Access: Level entry     Home Layout: Two level;1/2 bath on main level Home Equipment: Walker - 2 wheels;Shower seat;Walker - 4 wheels;Wheelchair - manual;Bedside commode Additional Comments: Pt living with daughter while undergoing chemo.  Considering getting bath with shower placed downstairs in her laundry room    Prior Function Level of Independence: Needs assistance   Gait / Transfers Assistance Needed: Pt uses RW in home and rollator in community so she could take rest breaks.  Could do community ambulation.  ADL's / Homemaking Assistance Needed: daughter helps her up steps to shower        Hand Dominance   Dominant Hand: Right    Extremity/Trunk Assessment   Upper Extremity Assessment Upper Extremity Assessment:  Generalized weakness    Lower Extremity Assessment Lower Extremity Assessment: Generalized weakness       Communication   Communication: No difficulties  Cognition Arousal/Alertness: Awake/alert Behavior During Therapy: WFL for tasks assessed/performed Overall  Cognitive Status: Within Functional Limits for tasks assessed                                        General Comments General comments (skin integrity, edema, etc.): Eager for daughter's arrival, has not walked this admission, but felt good to walk, eager to participate in mobility.    Exercises     Assessment/Plan    PT Assessment Patient needs continued PT services  PT Problem List Decreased strength;Decreased activity tolerance;Decreased mobility;Decreased knowledge of use of DME;Decreased safety awareness       PT Treatment Interventions DME instruction;Stair training;Therapeutic activities;Balance training;Patient/family education;Therapeutic exercise;Functional mobility training;Gait training    PT Goals (Current goals can be found in the Care Plan section)  Acute Rehab PT Goals Patient Stated Goal: to go home PT Goal Formulation: With patient Time For Goal Achievement: 01/06/20 Potential to Achieve Goals: Good    Frequency Min 3X/week   Barriers to discharge        Co-evaluation               AM-PAC PT "6 Clicks" Mobility  Outcome Measure Help needed turning from your back to your side while in a flat bed without using bedrails?: None Help needed moving from lying on your back to sitting on the side of a flat bed without using bedrails?: A Little Help needed moving to and from a bed to a chair (including a wheelchair)?: A Little Help needed standing up from a chair using your arms (e.g., wheelchair or bedside chair)?: None Help needed to walk in hospital room?: A Little Help needed climbing 3-5 steps with a railing? : A Little 6 Click Score: 20    End of Session   Activity Tolerance: Patient tolerated treatment well Patient left: in chair;with call bell/phone within reach;with nursing/sitter in room   PT Visit Diagnosis: Muscle weakness (generalized) (M62.81)    Time: 0935-1000 PT Time Calculation (min) (ACUTE ONLY): 25  min   Charges:   PT Evaluation $PT Eval Low Complexity: 1 Low PT Treatments $Gait Training: 8-22 mins        Rhonda Hale, [name] [phone] 12/23/2019   Rhonda Hale 12/23/2019, 10:15 AM

## 2019-12-23 NOTE — Consult Note (Signed)
[place] Nurse ostomy follow up Patient receiving care in [facility] [phone] Stoma type/location: Mid abdomen Stomal assessment/size: deferred.  Eakin pouch replaced last night, no signs of impending leakage. Bedside drainage bag obtained and existing Eakin pouch connected to it to help preserve the integrity of the existing pouch. Peristomal assessment: deferred Treatment options for stomal/peristomal skin: barrier ring Output: thin green effluent Ostomy pouching: 1pc. Small Eakin Pouch  Education provided: Patient and her daughter educated during previous hospitalization. Enrolled patient in [place] Start Discharge program: No--currently needs Eakin Pouch due to support rods.   Monitor the wound area(s) for worsening of condition such as: Signs/symptoms of infection,  Increase in size,  Development of or worsening of odor, Development of pain, or increased pain at the affected locations.  Notify the medical team if any of these develop.  Thank you for the consult.  Discussed plan of care with the patient.  [place] nurse will not follow at this time.  Please re-consult the [place] team if needed.  Val Riles, RN, MSN, CWOCN, CNS-BC, pager [phone]

## 2019-12-23 NOTE — Progress Notes (Signed)
Delice Bison to be D/C'd  per MD order. Discussed with the patient and all questions fully answered.   IV catheter discontinued intact. Site without signs and symptoms of complications. Dressing and pressure applied.  An After Visit Summary was printed and given to the patient. Patient received prescription.  D/c education completed with patient/family including follow up instructions, medication list, d/c activities limitations if indicated, with other d/c instructions as indicated by MD - patient able to verbalize understanding, all questions fully answered.   Patient instructed to return to ED, call 911, or call MD for any changes in condition.   Patient to be escorted via [place], and D/C home via private auto.

## 2019-12-24 ENCOUNTER — Telehealth: Payer: Self-pay | Admitting: Internal Medicine

## 2019-12-24 ENCOUNTER — Telehealth: Payer: Self-pay

## 2019-12-24 NOTE — Telephone Encounter (Signed)
Placed call to schedule [facility] Palliative visit and patients daughter could not talk at that time. She advised she will call us back to schedule.

## 2019-12-24 NOTE — Telephone Encounter (Signed)
[facility] Palliative visit scheduled for 12-27-19 at 1:00.

## 2019-12-24 NOTE — Telephone Encounter (Signed)
Phone call placed to patient to check in s/p hospitalization and to introduce Palliative care. VM left with call back information.

## 2019-12-25 ENCOUNTER — Other Ambulatory Visit: Payer: Self-pay | Admitting: *Deleted

## 2019-12-25 ENCOUNTER — Telehealth: Payer: Self-pay | Admitting: Internal Medicine

## 2019-12-25 DIAGNOSIS — K56609 Unspecified intestinal obstruction, unspecified as to partial versus complete obstruction: Secondary | ICD-10-CM

## 2019-12-25 MED FILL — MEGESTROL 20 MG TABLET: 20 | 30 days supply | Qty: 120 | Fill #4

## 2019-12-25 MED FILL — LEVOTHYROXINE SODIUM 150 MC: 150 | 90 days supply | Qty: 90 | Fill #1

## 2019-12-25 NOTE — Telephone Encounter (Signed)
Pt's daughter requesting Ostomy supplies from [place] supply.  The Patient uses the Straight Drainage Bag Eakins Pouch Bag.  Please contact the patient daughter if any questions.

## 2019-12-25 NOTE — Telephone Encounter (Signed)
Ordered DME bag for new ostomy that was blocked and bag needed replacing.

## 2019-12-25 NOTE — Telephone Encounter (Signed)
Spoke with Haynes Dage at [facility]. States they need the numbers on the bags/pouches to know if they carry them. Also, a doctor's order is not needed as they do not file insurance. Call placed to daughter. States ostomy nurse was at home today and there is a blockage in drainage bag so they need one bag tonight. Ostomy RN will help establish with [place] for supplies going forward. Daughter will call [facility] now with number for drainage bag and will call us back if there are any difficulties. Hubbard Hartshorn, BSN, RN-BC

## 2019-12-25 NOTE — Telephone Encounter (Addendum)
Daleen Snook called back stating they need doctor's order for a urinary drainage bag for the colostomy. Ordered by today's Attending and Faxed to [facility] at [phone]. Fax confirmation receipt received. Notified Daleen Snook this has been done and she was very Patent attorney. Hubbard Hartshorn, BSN, RN-BC

## 2019-12-26 MED ORDER — LEVOTHYROXINE SODIUM 150 MCG PO TABS: 150.0000 ug | ORAL_TABLET | Freq: Every day | ORAL | 1 refills | Status: AC

## 2019-12-27 ENCOUNTER — Other Ambulatory Visit: Payer: Medicare Other | Admitting: Internal Medicine

## 2019-12-27 ENCOUNTER — Other Ambulatory Visit: Payer: Self-pay

## 2019-12-27 ENCOUNTER — Encounter: Payer: Self-pay | Admitting: Internal Medicine

## 2019-12-27 DIAGNOSIS — Z515 Encounter for palliative care: Secondary | ICD-10-CM

## 2019-12-27 DIAGNOSIS — Z7189 Other specified counseling: Secondary | ICD-10-CM

## 2019-12-27 NOTE — Progress Notes (Signed)
Jan 15th, 2020 [place] Consult Note Telephone: [phone]  Fax: [phone]  PATIENT NAME: Rhonda Hale DOB: 12-Mar-1946 MRN: [id number] [place] [id number] [facility]  PRIMARY CARE PROVIDER:    Marianna Payment, MD Dr. Juline Patch [facility] [place]   [place], [place] [address]   [phone] (Fax)   REFERRING PROVIDER:  Marianna Payment, MD [place],  [place] [address]  RESPONSIBLE PARTY: (daughter/HCPOA) Dorice Lamas [email or web address] [phone] 12/23/2019 Matt McDonald: dtr Darci Current [phone]  ASSESSMENT / RECOMMENDATIONS:  1. Advance Care Planning: A. Directives: Reviewed / completed DNR and MOST forms. Patient, in the presence of her daughters, affirmed her wish to not be resuscitated in the event of a cardiopulmonary arrest. MOST form details: DNR/DNI. Limited Scope of Medical interventions. Yes to Antibiotics/IVFs. No to Tube Feedings. Forms left in home and uploaded into [facility] EMR.   B. Goals of Care: Hoping to gain strength and improve nutritional status to be candidate for further chemotherapy. Patient's daughter discusses openly with patient anticipation that chemo options will soon be abandoned and would wish to pursue Hospice services at that time. We did discuss in brief, scope of hospice services and eligibility criteria.  2. Cognitive / Functional status: Patient is alert and oriented; daughter reports episodic forgetfulness/confusion. Increased somnolence; awake only about 4 hours/day. Able to transfer and ambulate independently (rollator walker), though daughters usually stand by for steadying assist. Patient unable to ascend stairs; bedroom on first floor. She is independent in hygiene, dressing, and toileting. Currently poor appetite despite Megace. "Everything tastes bad". When eating  food with consistency greater than applesauce had epigastric and esophageal pain and sometimes associated nausea and emesis. These episodes have notable increased in frequency. Exacerbated with carbonated beverages. Better with frosties, cream of wheat. Patient has been eating just bites or 25% of meals. (12/23/2019) weight 73.4kg. BMI 29.59kg/m2. Dislikes most liquid supplements. We discussed trying [employer]. Passing very soft stool via colostomy bag. Patient admits to times of depression. copes by seeking out and talking to her family members or watching TV (she's a political junkie).    -will order Zofran 4mg  prn nausea  -check with PCP to initiate Remeron 15mg  qhs for appetite/depression  3. Family Supports: Lives in home (for last 7 months; moved from [place]) of her daughter Daleen Snook, here in [place]. Another daughter Raquel Sarna is visiting from out of town. Krista's 3 teenagers are out and about in the home, as well as Emily's son. [facility] follows for ostomy care/supplies  4. Follow up Palliative Care Visit: Daughter Daleen Snook would like to call me on a prn basis.  I spent 22minutes providing this consultation from 1pm to 2pm. More than 50% of the time in this consultation was spent coordinating communication.   HISTORY OF PRESENT ILLNESS:  Rhonda Hale is a 74 y.o. year old female with metastatic ovarian cancer (TAH BSO 2018; current chemo), loop ostomy (for lg L bowel obst 12/11/2019), acute PE (03/15/2019, Eliquis), HTN, major depressive disorder (in remission), post op hypothyroidism, HLD, hypoclacemia (post op r/t parathyroid involvement), hypoparathyroidism (post surgical), splenectomy, neuropathy (d/t chemo), generalized anxiety disorder, COPD.  1/6-1/10/2020 hospitalized and treated for Lg bowel obst C Diff Colitis. Hgb 12/23/2019 7.7 Palliative Care was asked to help address goals of care.   CODE STATUS:  DNR/DNI  PPS: 50%  HOSPICE ELIGIBILITY/DIAGNOSIS: Yes, if  discontinues chemo  PAST MEDICAL HISTORY:  Past Medical History:  Diagnosis Date  . Cancer ([place])   . COPD (chronic obstructive pulmonary disease) ([place])   . Hypertension   . PRES (posterior reversible encephalopathy syndrome)   . SBO (small bowel obstruction) ([place]) 11/2019    SOCIAL HX:  Social History   Tobacco Use  . Smoking status: Former Smoker    Years: 20.00    Types: Cigarettes  . Smokeless tobacco: Never Used  . Tobacco comment: quit yrs ago  Substance Use Topics  . Alcohol use: Not Currently    ALLERGIES:  Allergies  Allergen Reactions  . Atorvastatin Other (See Comments)    Severe heartburn, reflux     PERTINENT MEDICATIONS:  Outpatient Encounter Medications as of 12/27/2019  Medication Sig  . acetaminophen (TYLENOL) 325 MG tablet Take 2 tablets (650 mg total) by mouth every 6 (six) hours as needed for mild pain (or Fever >/= 101).  Marland Kitchen amLODipine (NORVASC) 5 MG tablet Take 1 tablet (5 mg total) by mouth daily.  Marland Kitchen apixaban (ELIQUIS) 5 MG TABS tablet Take 1 tablet (5 mg total) by mouth 2 (two) times daily.  . calcitRIOL (ROCALTROL) 0.5 MCG capsule Take 1 capsule (0.5 mcg total) by mouth daily.  . Calcium Carb-Cholecalciferol (CALCIUM-VITAMIN D) 600-400 MG-UNIT TABS Take 1 tablet by mouth 2 (two) times a day.  . escitalopram (LEXAPRO) 20 MG tablet Take 1 tablet (20 mg total) by mouth daily.  Marland Kitchen labetalol (NORMODYNE) 100 MG tablet Take 0.5 tablets (50 mg total) by mouth 2 (two) times daily.  Marland Kitchen levothyroxine (SYNTHROID) 150 MCG tablet Take 1 tablet (150 mcg total) by mouth daily before breakfast.  . megestrol (MEGACE) 20 MG tablet Take 2 tablets (40 mg total) by mouth 2 (two) times daily.  . methocarbamol (ROBAXIN) 750 MG tablet Take 1 tablet (750 mg total) by mouth every 6 (six) hours as needed for muscle spasms.  . Multiple Vitamin (MULTIVITAMIN WITH MINERALS) TABS tablet Take 1 tablet by mouth daily.  Marland Kitchen oxyCODONE (OXY IR/ROXICODONE) 5 MG immediate release tablet Take 1  tablet (5 mg total) by mouth every 6 (six) hours as needed for moderate pain or severe pain.  . polycarbophil (FIBERCON) 625 MG tablet Take 1 tablet (625 mg total) by mouth 2 (two) times daily.  . potassium chloride (KLOR-CON) 10 MEQ tablet Take 3 tablets (30 mEq total) by mouth 2 (two) times daily for 14 days.  Marland Kitchen QUEtiapine (SEROQUEL) 25 MG tablet Take 1 tablet (25 mg total) by mouth at bedtime. (Patient taking differently: Take 12.5 mg by mouth at bedtime. )  . simethicone (MYLICON) 80 MG chewable tablet Chew 1 tablet (80 mg total) by mouth 4 (four) times daily.  Marland Kitchen umeclidinium bromide (INCRUSE ELLIPTA) 62.5 MCG/INH AEPB Inhale 1 puff into the lungs daily.  . vancomycin (VANCOCIN) 125 MG capsule Take 1 capsule (125 mg total) by mouth 4 (four) times daily.   No facility-administered encounter medications on file as of 12/27/2019.    PHYSICAL EXAM:   Slender, looks a little fatigued. Resting in recliner. Daughters in attendance. Nurse from [place] arrived at the end of my visit. Abbreviated PE to decrease risk of COVID exposure Extremities: no edema, no joint deformities Skin: no rashes Neurological: Weakness but otherwise non-focal  Julianne Handler, NP

## 2019-12-31 ENCOUNTER — Telehealth: Payer: Self-pay | Admitting: Internal Medicine

## 2019-12-31 NOTE — Telephone Encounter (Signed)
Rec'd call from [facility].   Pt is requesting to D/C from Palliative Care to Hospice Care, if approved who will be the Attending.  Also Violeta Gelinas Requesting a  Medication  Zofran for Nausea and Remeron 15 mg for the patient Appetite and Depression.    Please call back.

## 2019-12-31 NOTE — Telephone Encounter (Signed)
Please advise 

## 2020-01-01 ENCOUNTER — Encounter: Payer: Self-pay | Admitting: Internal Medicine

## 2020-01-01 ENCOUNTER — Other Ambulatory Visit: Payer: Self-pay

## 2020-01-01 ENCOUNTER — Ambulatory Visit (INDEPENDENT_AMBULATORY_CARE_PROVIDER_SITE_OTHER): Payer: Medicare Other | Admitting: Internal Medicine

## 2020-01-01 VITALS — BP 113/70 | HR 93 | Temp 97.6°F | Ht 62.0 in | Wt 147.4 lb

## 2020-01-01 DIAGNOSIS — C569 Malignant neoplasm of unspecified ovary: Secondary | ICD-10-CM

## 2020-01-01 DIAGNOSIS — Z79899 Other long term (current) drug therapy: Secondary | ICD-10-CM | POA: Diagnosis not present

## 2020-01-01 DIAGNOSIS — Z8719 Personal history of other diseases of the digestive system: Secondary | ICD-10-CM

## 2020-01-01 MED ORDER — MIRTAZAPINE 15 MG PO TABS: 15.0000 mg | ORAL_TABLET | Freq: Every day | ORAL | 3 refills | Status: AC

## 2020-01-01 MED ORDER — ONDANSETRON HCL 4 MG PO TABS: 4.0000 mg | ORAL_TABLET | Freq: Four times a day (QID) | ORAL | 1 refills | Status: AC | PRN

## 2020-01-01 MED FILL — MIRTAZAPINE 15 MG TABLET: 15 | 30 days supply | Qty: 30 | Fill #0

## 2020-01-01 MED FILL — ONDANSETRON HCL 4 MG TABLET: 4 | 8 days supply | Qty: 30 | Fill #0

## 2020-01-01 NOTE — Telephone Encounter (Signed)
Attempted to call back. Left HIPPA compliant voice mail. I saw Rhonda Hale today in clinic. We discussed goals of care and I submitted the hospice referral as requested.

## 2020-01-01 NOTE — Progress Notes (Signed)
   CC: Hospital Follow Up  HPI:  [name] is a 74 y.o. female with a past medical history stated below and presents today for hospital follow up. Please see problem based assessment and plan for additional details.   Past Medical History:  Diagnosis Date  . Cancer ([place])   . COPD (chronic obstructive pulmonary disease) ([place])   . Hypertension   . PRES (posterior reversible encephalopathy syndrome)   . SBO (small bowel obstruction) ([place]) 11/2019     Review of Systems: ROS   Vitals:   01/01/20 1534  BP: 113/70  Pulse: 93  Temp: 97.6 F (36.4 C)  TempSrc: Oral  SpO2: 98%  Weight: 147 lb 6.4 oz (66.9 kg)  Height: 5\' 2"  (1.575 m)     Physical Exam: Physical Exam  Constitutional: She is oriented to person, place, and time. She appears unhealthy.  HENT:  Head: Normocephalic and atraumatic.  Eyes: EOM are normal.  Cardiovascular: Normal rate, regular rhythm, normal heart sounds and intact distal pulses.  Pulmonary/Chest: Effort normal. No respiratory distress.  Abdominal: She exhibits no distension. There is no abdominal tenderness.  Musculoskeletal:        General: No tenderness or edema.     Cervical back: Normal range of motion.  Neurological: She is alert and oriented to person, place, and time.  Skin: Skin is warm and dry.     Assessment & Plan:   See Encounters Tab for problem based charting.  Patient discussed with Dr. Philipp Ovens

## 2020-01-01 NOTE — Telephone Encounter (Signed)
TC from PCG daughter [place].   Rhonda Hale reports that patient desires to discontinue further chemo therapy, and would like to initiate referral for hospice services. I called office of PCP Dr. Marianna Payment, requesting permission to initiate referral, and to ask if he would be agreeable to continue on as patient's acting attending physician. Also, permission to order Zofran for nausea, and Remeron for appetite enhancement/depression.  Violeta Gelinas NP-C [phone]

## 2020-01-01 NOTE — Patient Instructions (Signed)
Thank you, Ms.Delice Bison for allowing [place] to provide your care today. Today we discussed Goals of care, hospice referral.    I have ordered no labs for you. I will call if any are abnormal.    I have place a referrals to none.   I have ordered the following tests: none   I have ordered the following medication/changed the following medications: see AVS for medication changes.   Please follow-up in as needed.    Should you have any questions or concerns please call the internal medicine clinic at [phone].    Marianna Payment, D.O. [facility]

## 2020-01-02 ENCOUNTER — Encounter: Payer: Self-pay | Admitting: Internal Medicine

## 2020-01-02 NOTE — Assessment & Plan Note (Addendum)
Patient presents the clinic after recently being discharged from the hospital with C. difficile.  Patient has had a long discussion with her oncologist and they have decided to stop chemotherapy at this time and pursue outpatient hospice and comfort care.  The patient confirmed that she already filled out the DNR, MOST and goals of care documentation with her oncologist.  Plan: -I will submit an urgent referral for outpatient hospice and pursue comfort care measures. I will discontinue her medications that do not comply with her goals of care. -We will continue to comanage her medical issues with outpatient hospice. -I prescribed Zofran for nausea and Remeron for appetite

## 2020-01-03 ENCOUNTER — Telehealth: Payer: Self-pay | Admitting: Internal Medicine

## 2020-01-07 NOTE — Progress Notes (Signed)
Internal Medicine Clinic Attending  Case discussed with Dr. Marianna Payment at the time of the visit.  We reviewed the resident's history and exam and pertinent patient test results.  I agree with the assessment, diagnosis, and plan of care documented in the resident's note with the following addition:  Multiple chronic medications were discontinued this visit at family's request to transition to comfort care including amlodipine, calcitriol, calcium/vit D, labetalol, multivitamin, and potassium supplements.

## 2020-01-13 ENCOUNTER — Encounter: Payer: Self-pay | Admitting: Internal Medicine

## 2020-01-13 ENCOUNTER — Other Ambulatory Visit: Payer: Self-pay | Admitting: *Deleted

## 2020-01-14 NOTE — Telephone Encounter (Signed)
 -----   Message ----- From: Velora Heckler, RN Sent: 01/03/2020  10:31 AM EST To: Hulan Fray, RN, Zola Button, * Subject: RE: Hospice (and palliative care)              Spoke with Violeta Gelinas, NP regarding patient's referral to Hospice. States patient is all set, they just need name of Attending as Resident is not able to sign Hospice orders. Call placed to [place] [phone]) and gave Delsa Sale Dr. Rivka Safer name as she was Attending when referral was placed. Delsa Sale states a RN is making home visit this afternoon. Nothing further needed at this time. Hubbard Hartshorn, BSN, RN-BC

## 2020-01-15 ENCOUNTER — Other Ambulatory Visit: Payer: Self-pay | Admitting: *Deleted

## 2020-01-15 MED ORDER — APIXABAN 5 MG PO TABS: 5.0000 mg | ORAL_TABLET | Freq: Two times a day (BID) | ORAL | 5 refills | Status: AC

## 2020-01-15 MED FILL — ELIQUIS 5 MG TABLET: 5 | 30 days supply | Qty: 60 | Fill #0

## 2020-01-18 ENCOUNTER — Other Ambulatory Visit: Payer: Self-pay | Admitting: Internal Medicine

## 2020-01-27 MED FILL — MIRTAZAPINE 15 MG TABLET: 15 | 30 days supply | Qty: 30 | Fill #1

## 2020-01-27 MED FILL — ONDANSETRON HCL 4 MG TABLET: 4 | 8 days supply | Qty: 30 | Fill #1

## 2020-01-27 MED FILL — ESCITALOPRAM 20 MG TABLET: 20 | 30 days supply | Qty: 30 | Fill #0

## 2020-01-28 ENCOUNTER — Other Ambulatory Visit: Payer: Self-pay | Admitting: *Deleted

## 2020-01-28 NOTE — Telephone Encounter (Signed)
Received faxed refill request from pt's pharmacy for quetiapine 25mg  tab take one at bedtime with the following note  CLARIFICATION REQUESTED: Per pt's daughter, pt is taking 12.5mg  at bedtime.  Please send new script to reflect this change.  Thanks!  Will send to pcp for refill and dose change if appropriate.  Please advise.Despina Hidden Cassady2/16/202110:43 AM

## 2020-01-29 MED ORDER — QUETIAPINE FUMARATE 25 MG PO TABS: 25.0000 mg | ORAL_TABLET | Freq: Every day | ORAL | 1 refills | Status: AC

## 2020-01-29 MED FILL — QUETIAPINE FUMARATE 25 MG T: 25 | 90 days supply | Qty: 90 | Fill #0

## 2020-02-05 ENCOUNTER — Encounter: Payer: Self-pay | Admitting: Internal Medicine

## 2020-02-10 IMAGING — CT CT ABD-PELV W/ CM
2 of 5 series · 15 of 46 positions shown, 17 images · IV contrast (APPLIED)
Comparison: Abdominal CT 07/05/2019

CLINICAL DATA: Nausea and vomiting. Lower abdominal pain and
constipation. Bowel obstruction suspected. History of ovarian
cancer, active chemotherapy.

EXAM:
CT ABDOMEN AND PELVIS WITH CONTRAST
TECHNIQUE: Multidetector CT imaging of the abdomen and pelvis was performed
using the standard protocol following bolus administration of
intravenous contrast.
CONTRAST:  100mL OMNIPAQUE IOHEXOL 300 MG/ML  SOLN

[Series 3: abdomen 5.0 · axial · 0.85mm/px · z∈[+744,+1149]mm · 12 of 93 slices shown, 14 images]
[im 6/93  soft-tissue]
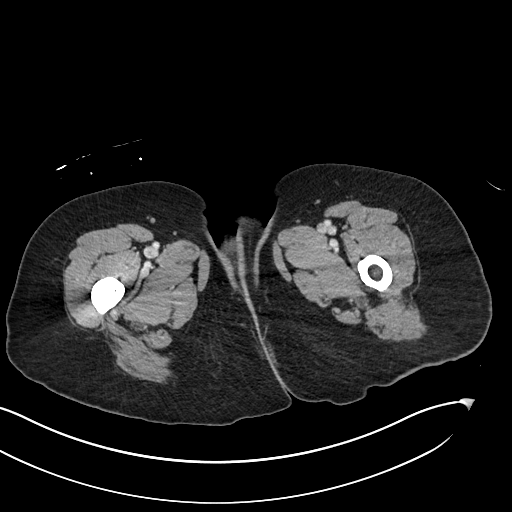
[im 6/93  bone]
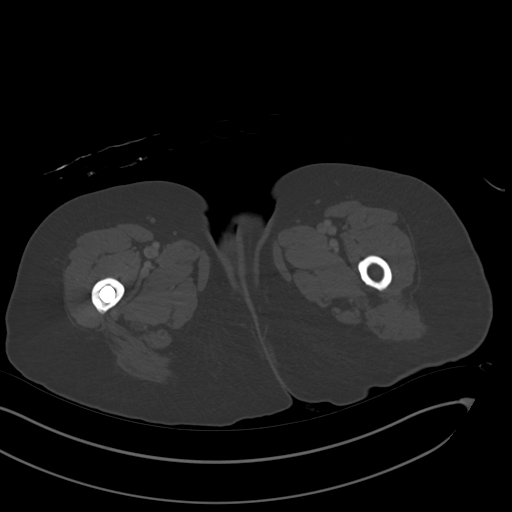
[im 12/93  soft-tissue]
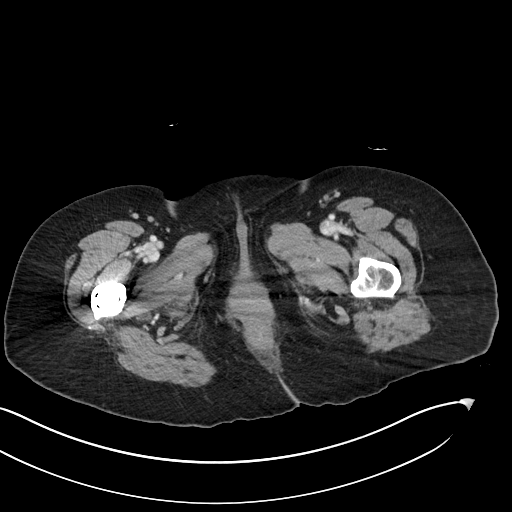
[im 24/93  soft-tissue]
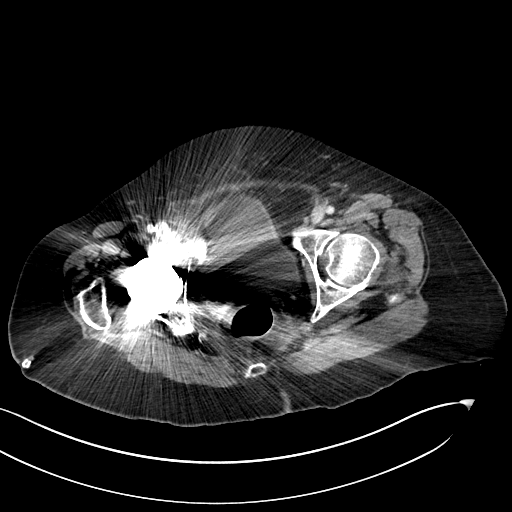
[im 29/93  soft-tissue]
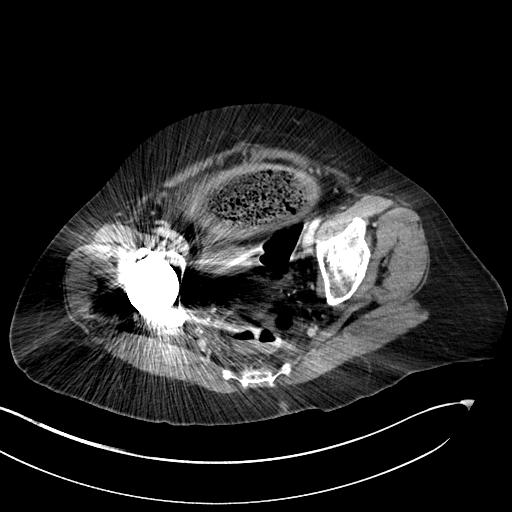
[im 35/93  soft-tissue]
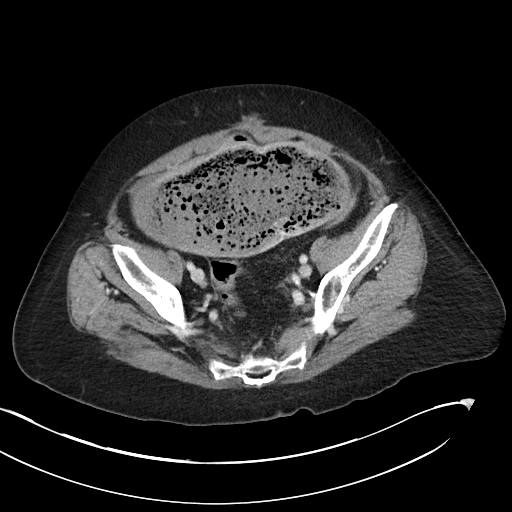
[im 41/93  soft-tissue]
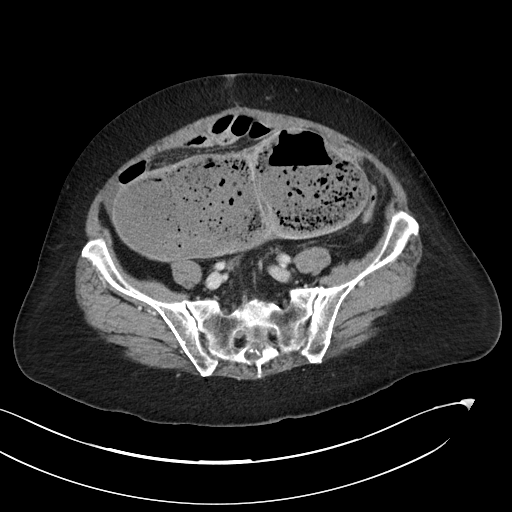
[im 52/93  soft-tissue]
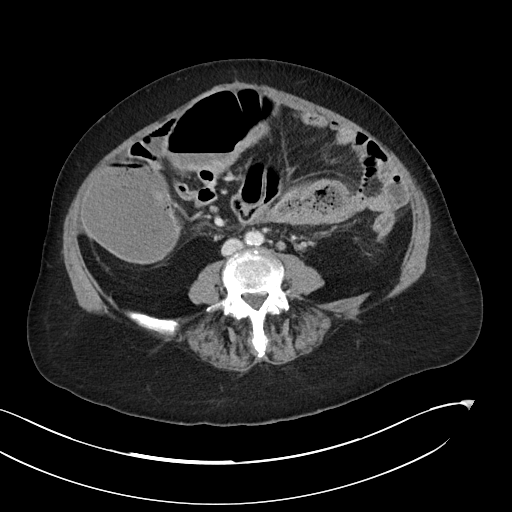
[im 58/93  soft-tissue]
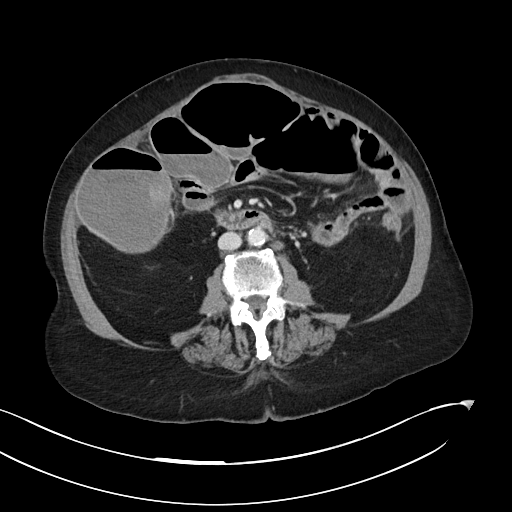
[im 64/93  soft-tissue]
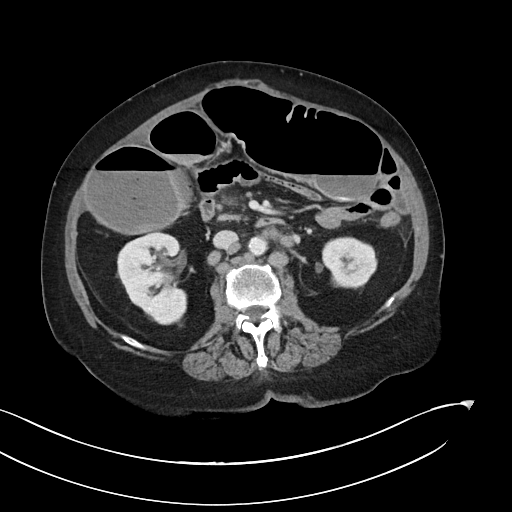
[im 64/93  bone]
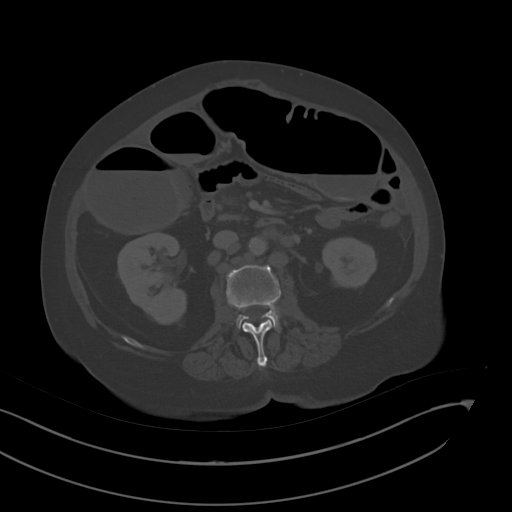
[im 70/93  soft-tissue]
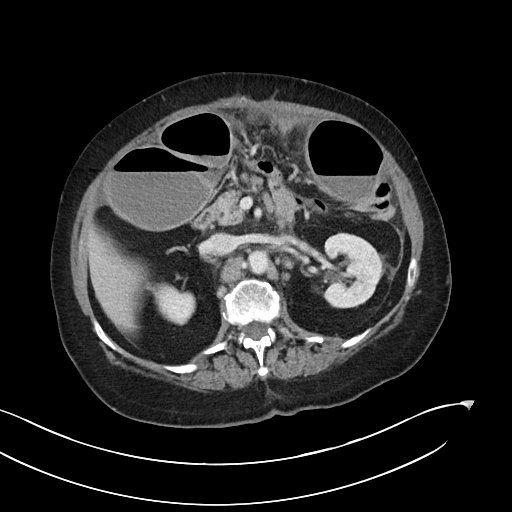
[im 81/93  soft-tissue]
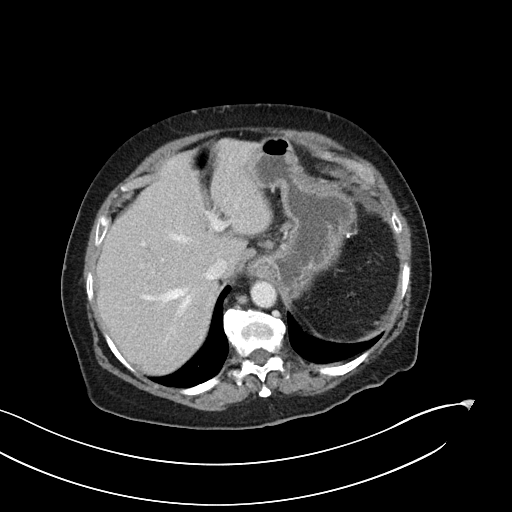
[im 87/93  soft-tissue]
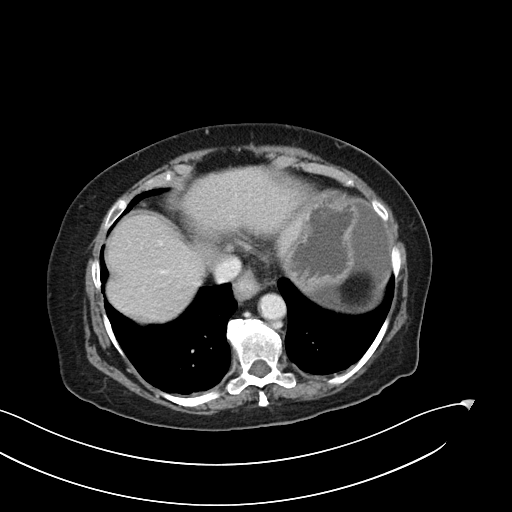

[Series 6: abdomen 3.0 mpr cor · coronal · 0.78mm/px · 3 of 107 slices shown]
[im 36/107  soft-tissue]
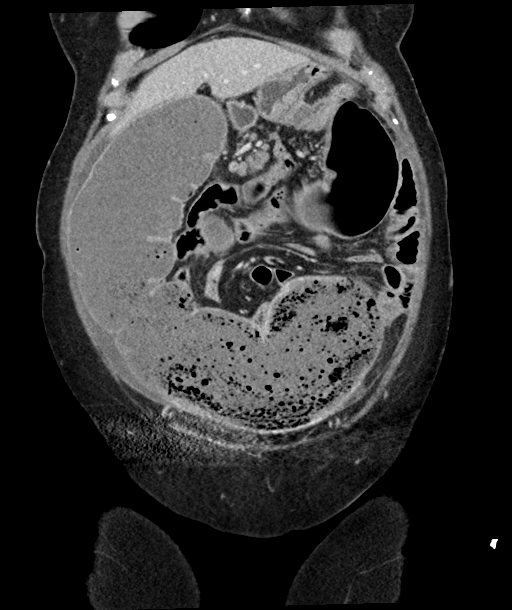
[im 48/107  soft-tissue]
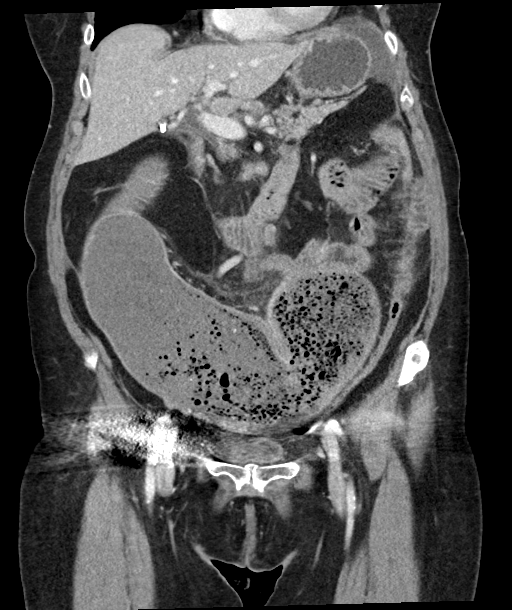
[im 59/107  soft-tissue]
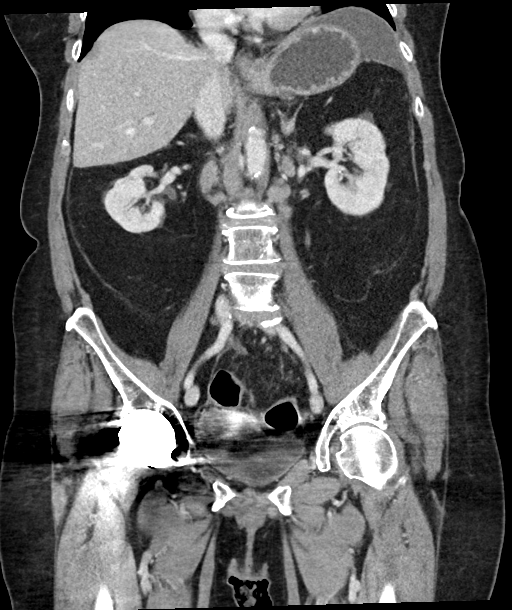

[15 of 46 positions shown; findings below may reference images not displayed]

FINDINGS: Lower chest: Heterogeneous basilar pulmonary parenchyma, diminished
heterogeneity from prior exam.

Hepatobiliary: No focal hepatic lesion. Clips in the gallbladder
fossa postcholecystectomy. No biliary dilatation.

Pancreas: No ductal dilatation or inflammation.

Spleen: Splenectomy.

Adrenals/Urinary Tract: No adrenal nodule. No hydronephrosis or
perinephric edema. Coarse calcification in the lower right kidney
favored to be intraparenchymal rather than nonobstructing stone.
Slight lobular bilateral renal contours. Urinary bladder is
partially distended, grossly normal but partially obscured by streak
artifact from right hip arthroplasty.

Stomach/Bowel: Cecum, ascending, and transverse colon are dilated
significantly. Cecum measures up to 9.3 cm. No pneumatosis. There is
abrupt transition point from dilated to nondilated bowel in the left
upper quadrant the region of the splenic flexure, series 3, image
18. descending and sigmoid colon are decompressed. No definite
mesenteric twist or mesenteric swirling. Distal ileum is slightly
patulous, no significant small bowel dilatation. Umbilical hernia
contains small bowel without inflammation or small bowel
obstruction. Fluid/ingested material in the stomach, questionable
gastric wall thickening anteriorly.

Vascular/Lymphatic: Aortic atherosclerosis without aneurysm.
Retroperitoneal adenopathy which is new from prior exam, including
12 mm left periaortic node, series 3, image 33. 14 mm retrocaval
node series 3, image 28. Multiple additional prominent
retroperitoneal and central mesenteric lymph nodes. Few prominent
portal caval nodes are subcentimeter.

Reproductive: Reported hysterectomy, however pelvic structures are
obscured by streak artifact from right hip arthroplasty. No
visualized adnexal mass.

Other: Small volume of free fluid/ascites in the left greater than
right upper quadrant. Minimal free fluid in the right lower
quadrant. Mild central mesenteric edema. Ill-defined soft tissue
density in the central upper abdomen, series 3, image 19, difficult
to delineate decompressed bowel from adenopathy/omental nodularity.
Small fat containing right upper ventral abdominal wall hernia.
Periumbilical hernia contains nonobstructed or inflamed small bowel.
No free air.

Musculoskeletal: Right hip arthroplasty. No acute osseous
abnormality or focal bone lesion. Degenerative change in the spine.
IMPRESSION: 1. Findings consistent with colonic obstruction with abrupt
transition point in the left upper quadrant at the region of the
splenic flexure. Findings may be due to adhesions given prior
splenectomy, however this is in the region of ill-defined soft
tissue density which is poorly defined. It is unclear if this soft
tissue density represents decompressed adjacent bowel loops or
possibly omental disease given history of ovarian cancer.
2. New retroperitoneal and mesenteric adenopathy, suspicious for
metastatic disease in the setting of ovarian cancer.
3. Small amount of free fluid/ascites in the upper quadrants and
right lower quadrant.
4. Umbilical hernia contains loop of small bowel, however no
associated small bowel obstruction or inflammation.

Aortic Atherosclerosis ([id number]).

## 2020-02-10 DEATH — deceased

## 2020-02-22 IMAGING — DX DG ABDOMEN 1V
1 series · 1 of 1 positions shown · non-contrast
Comparison: 12/10/2019 abdominal radiograph and 12/19/2019 CT
abdomen/pelvis

CLINICAL DATA: Abdominal pain, post diverting colostomy

EXAM:
ABDOMEN - 1 VIEW

[abdomen kub]
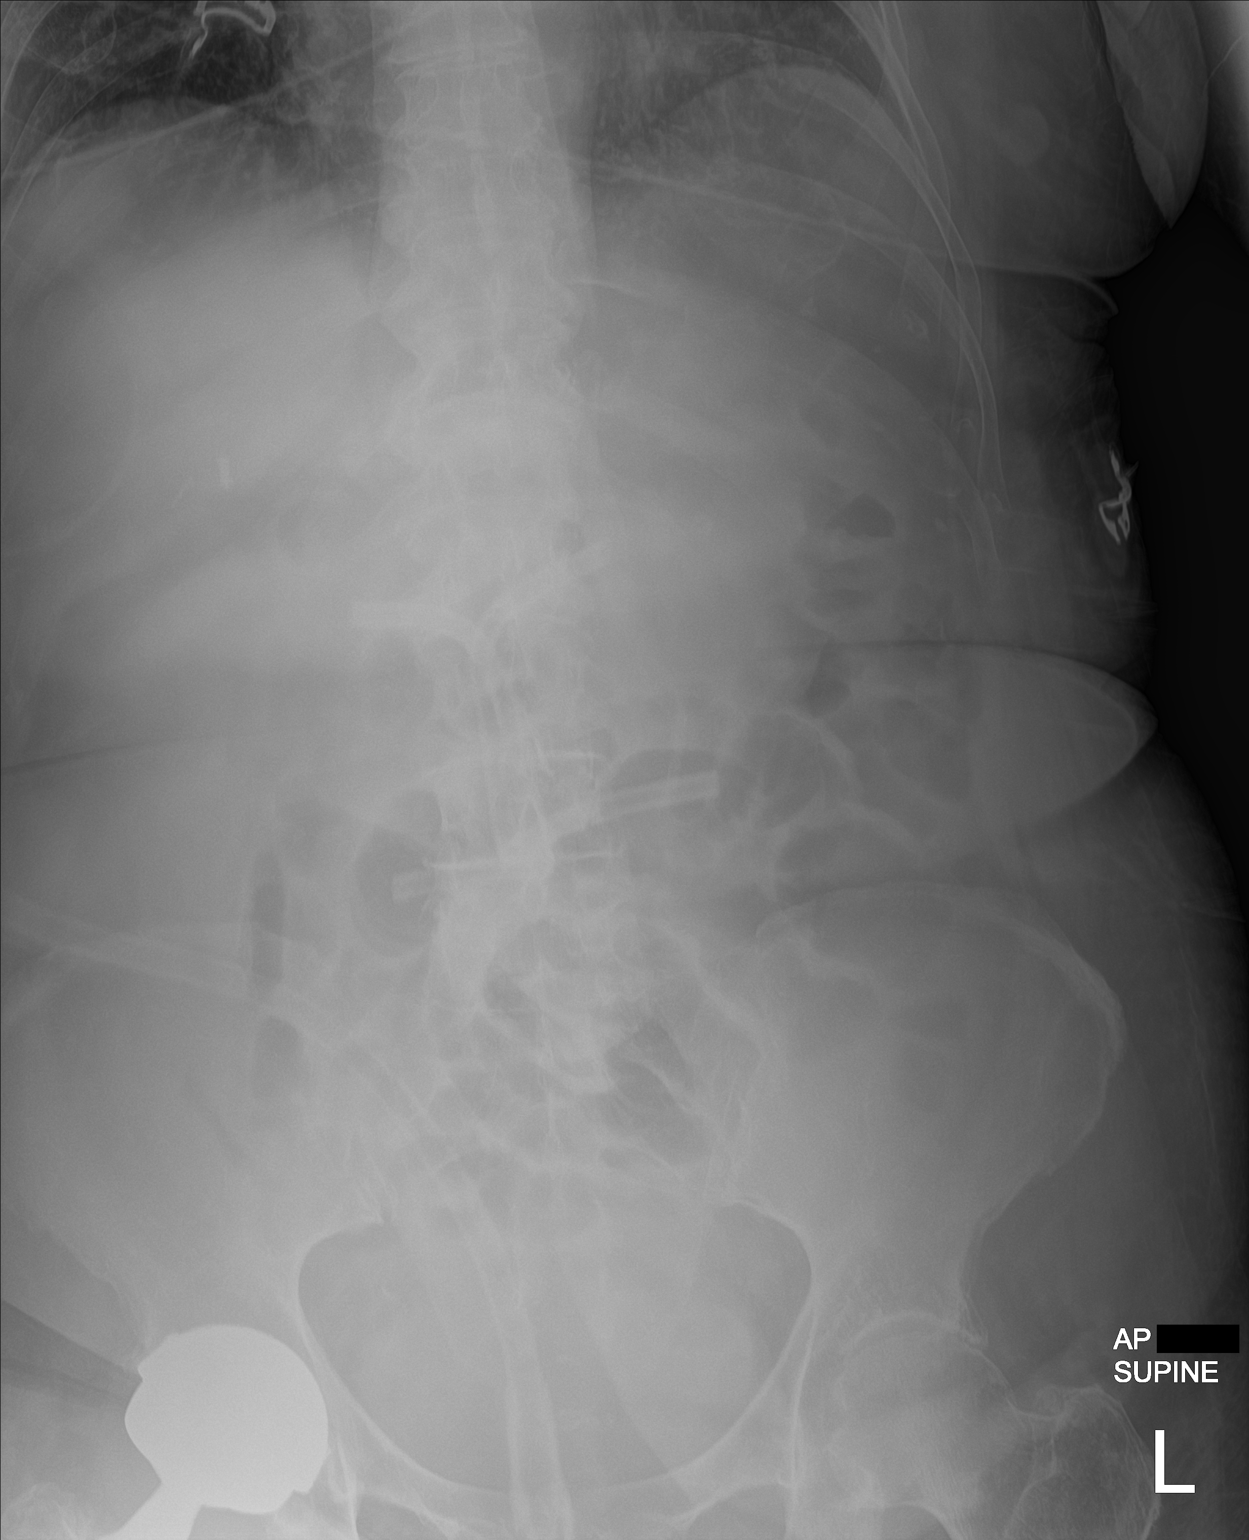

[1 of 1 positions shown; findings below may reference images not displayed]

FINDINGS: Surgical drains overlie the midline abdomen, unchanged. Mildly
dilated small bowel loops in the left and central abdomen, mildly
increased. No evidence of pneumatosis or pneumoperitoneum.
Cholecystectomy clips are seen in the right upper quadrant of the
abdomen. No radiopaque nephrolithiasis. Clear lung bases. Partially
visualized right hip arthroplasty.
IMPRESSION: Mildly dilated small bowel loops in the left and central abdomen,
mildly increased, compatible with mild postoperative adynamic ileus.

## 2020-02-26 LAB — BMP8+ANION GAP
Anion Gap: 19 mmol/L — ABNORMAL HIGH (ref 10.0–18.0)
BUN/Creatinine Ratio: 12 (ref 12–28)
BUN: 12 mg/dL (ref 8–27)
CO2: 18 mmol/L — ABNORMAL LOW (ref 20–29)
Calcium: 11.1 mg/dL — ABNORMAL HIGH (ref 8.7–10.3)
Chloride: 103 mmol/L (ref 96–106)
Creatinine, Ser: 1.01 mg/dL — ABNORMAL HIGH (ref 0.57–1.00)
GFR calc Af Amer: 63 mL/min/{1.73_m2} (ref >59)
GFR calc non Af Amer: 55 mL/min/{1.73_m2} — ABNORMAL LOW (ref >59)
Glucose: 72 mg/dL (ref 65–99)
Potassium: 3.5 mmol/L (ref 3.5–5.2)
Sodium: 140 mmol/L (ref 134–144)

## 2020-02-26 LAB — ALDOSTERONE + RENIN ACTIVITY W/ RATIO
ALDOS/RENIN RATIO: 4.3 (ref 0.0–30.0)
ALDOSTERONE: 8.3 ng/dL (ref 0.0–30.0)
Renin: 1.933 ng/mL/hr (ref 0.167–5.380)
